# Patient Record
Sex: Female | Born: 1935 | Race: White | Hispanic: No | Smoking: Never smoker
Health system: Southern US, Community
[De-identification: ages and names within clinical notes are randomized; demographics above are authoritative.]

## PROBLEM LIST (undated history)

## (undated) DIAGNOSIS — N8110 Cystocele, unspecified: Secondary | ICD-10-CM

## (undated) DIAGNOSIS — I10 Essential (primary) hypertension: Secondary | ICD-10-CM

## (undated) DIAGNOSIS — M4800 Spinal stenosis, site unspecified: Secondary | ICD-10-CM

## (undated) DIAGNOSIS — K52831 Collagenous colitis: Secondary | ICD-10-CM

## (undated) DIAGNOSIS — K589 Irritable bowel syndrome without diarrhea: Secondary | ICD-10-CM

## (undated) DIAGNOSIS — C801 Malignant (primary) neoplasm, unspecified: Secondary | ICD-10-CM

## (undated) DIAGNOSIS — N816 Rectocele: Secondary | ICD-10-CM

## (undated) DIAGNOSIS — K219 Gastro-esophageal reflux disease without esophagitis: Secondary | ICD-10-CM

## (undated) DIAGNOSIS — H269 Unspecified cataract: Secondary | ICD-10-CM

## (undated) DIAGNOSIS — R0789 Other chest pain: Secondary | ICD-10-CM

## (undated) DIAGNOSIS — H18519 Endothelial corneal dystrophy, unspecified eye: Secondary | ICD-10-CM

## (undated) DIAGNOSIS — K5790 Diverticulosis of intestine, part unspecified, without perforation or abscess without bleeding: Secondary | ICD-10-CM

## (undated) DIAGNOSIS — M549 Dorsalgia, unspecified: Secondary | ICD-10-CM

## (undated) DIAGNOSIS — M1990 Unspecified osteoarthritis, unspecified site: Secondary | ICD-10-CM

## (undated) DIAGNOSIS — G4700 Insomnia, unspecified: Secondary | ICD-10-CM

## (undated) DIAGNOSIS — R011 Cardiac murmur, unspecified: Secondary | ICD-10-CM

## (undated) DIAGNOSIS — T7840XA Allergy, unspecified, initial encounter: Secondary | ICD-10-CM

## (undated) DIAGNOSIS — B029 Zoster without complications: Secondary | ICD-10-CM

## (undated) DIAGNOSIS — B3781 Candidal esophagitis: Secondary | ICD-10-CM

## (undated) DIAGNOSIS — M419 Scoliosis, unspecified: Secondary | ICD-10-CM

## (undated) DIAGNOSIS — Z8739 Personal history of other diseases of the musculoskeletal system and connective tissue: Secondary | ICD-10-CM

## (undated) DIAGNOSIS — E210 Primary hyperparathyroidism: Secondary | ICD-10-CM

## (undated) DIAGNOSIS — H01009 Unspecified blepharitis unspecified eye, unspecified eyelid: Secondary | ICD-10-CM

## (undated) DIAGNOSIS — K449 Diaphragmatic hernia without obstruction or gangrene: Secondary | ICD-10-CM

## (undated) DIAGNOSIS — M810 Age-related osteoporosis without current pathological fracture: Secondary | ICD-10-CM

## (undated) DIAGNOSIS — H1851 Endothelial corneal dystrophy: Secondary | ICD-10-CM

## (undated) DIAGNOSIS — E785 Hyperlipidemia, unspecified: Secondary | ICD-10-CM

## (undated) HISTORY — DX: Unspecified osteoarthritis, unspecified site: M19.90

## (undated) HISTORY — DX: Hypercalcemia: E83.52

## (undated) HISTORY — DX: Candidal esophagitis: B37.81

## (undated) HISTORY — DX: Other chest pain: R07.89

## (undated) HISTORY — DX: Scoliosis, unspecified: M41.9

## (undated) HISTORY — DX: Diverticulosis of intestine, part unspecified, without perforation or abscess without bleeding: K57.90

## (undated) HISTORY — DX: Gastro-esophageal reflux disease without esophagitis: K21.9

## (undated) HISTORY — DX: Age-related osteoporosis without current pathological fracture: M81.0

## (undated) HISTORY — DX: Zoster without complications: B02.9

## (undated) HISTORY — DX: Cardiac murmur, unspecified: R01.1

## (undated) HISTORY — DX: Personal history of other diseases of the musculoskeletal system and connective tissue: Z87.39

## (undated) HISTORY — DX: Insomnia, unspecified: G47.00

## (undated) HISTORY — DX: Rectocele: N81.6

## (undated) HISTORY — DX: Essential (primary) hypertension: I10

## (undated) HISTORY — DX: Unspecified cataract: H26.9

## (undated) HISTORY — DX: Cystocele, unspecified: N81.10

## (undated) HISTORY — DX: Irritable bowel syndrome, unspecified: K58.9

## (undated) HISTORY — DX: Spinal stenosis, site unspecified: M48.00

## (undated) HISTORY — DX: Collagenous colitis: K52.831

## (undated) HISTORY — DX: Allergy, unspecified, initial encounter: T78.40XA

## (undated) HISTORY — PX: TONSILLECTOMY AND ADENOIDECTOMY: SUR1326

## (undated) HISTORY — DX: Unspecified blepharitis unspecified eye, unspecified eyelid: H01.009

## (undated) HISTORY — DX: Dorsalgia, unspecified: M54.9

## (undated) HISTORY — DX: Malignant (primary) neoplasm, unspecified: C80.1

## (undated) HISTORY — DX: Endothelial corneal dystrophy, unspecified eye: H18.519

## (undated) HISTORY — PX: ABDOMINAL HYSTERECTOMY: SHX81

## (undated) HISTORY — DX: Hyperlipidemia, unspecified: E78.5

## (undated) HISTORY — PX: CATARACT EXTRACTION W/ INTRAOCULAR LENS  IMPLANT, BILATERAL: SHX1307

## (undated) HISTORY — DX: Diaphragmatic hernia without obstruction or gangrene: K44.9

## (undated) HISTORY — DX: Endothelial corneal dystrophy: H18.51

## (undated) HISTORY — DX: Primary hyperparathyroidism: E21.0

---

## 1962-01-01 HISTORY — PX: BREAST BIOPSY: SHX20

## 1973-01-01 DIAGNOSIS — B029 Zoster without complications: Secondary | ICD-10-CM

## 1973-01-01 HISTORY — DX: Zoster without complications: B02.9

## 2004-02-01 ENCOUNTER — Ambulatory Visit: Payer: Self-pay | Admitting: Internal Medicine

## 2004-02-10 ENCOUNTER — Ambulatory Visit: Payer: Self-pay | Admitting: Internal Medicine

## 2006-04-08 ENCOUNTER — Ambulatory Visit: Payer: Self-pay | Admitting: Internal Medicine

## 2006-04-09 ENCOUNTER — Encounter: Payer: Self-pay | Admitting: Internal Medicine

## 2006-04-23 ENCOUNTER — Ambulatory Visit: Payer: Self-pay | Admitting: Internal Medicine

## 2006-04-24 ENCOUNTER — Ambulatory Visit: Payer: Self-pay | Admitting: Internal Medicine

## 2006-04-24 DIAGNOSIS — K5730 Diverticulosis of large intestine without perforation or abscess without bleeding: Secondary | ICD-10-CM | POA: Insufficient documentation

## 2006-04-24 HISTORY — PX: COLONOSCOPY: SHX174

## 2006-04-25 ENCOUNTER — Ambulatory Visit: Payer: Self-pay | Admitting: Internal Medicine

## 2006-05-02 ENCOUNTER — Ambulatory Visit: Payer: Self-pay | Admitting: Internal Medicine

## 2006-05-02 LAB — CONVERTED CEMR LAB
Albumin: 3.4 g/dL — ABNORMAL LOW (ref 3.5–5.2)
BUN: 13 mg/dL (ref 6–23)
CEA: 0.5 ng/mL (ref 0.0–5.0)
CO2: 30 meq/L (ref 19–32)
Calcium: 9.6 mg/dL (ref 8.4–10.5)
Chloride: 108 meq/L (ref 96–112)
Creatinine, Ser: 0.8 mg/dL (ref 0.4–1.2)
GFR calc Af Amer: 89 mL/min
GFR calc non Af Amer: 74 mL/min
Glucose, Bld: 90 mg/dL (ref 70–99)
Phosphorus: 3.5 mg/dL (ref 2.3–4.6)
Potassium: 4.5 meq/L (ref 3.5–5.1)
Sodium: 142 meq/L (ref 135–145)

## 2006-05-31 ENCOUNTER — Ambulatory Visit: Payer: Self-pay | Admitting: Internal Medicine

## 2006-08-27 ENCOUNTER — Ambulatory Visit: Payer: Self-pay | Admitting: Internal Medicine

## 2007-04-03 DIAGNOSIS — K648 Other hemorrhoids: Secondary | ICD-10-CM | POA: Insufficient documentation

## 2007-04-03 DIAGNOSIS — K219 Gastro-esophageal reflux disease without esophagitis: Secondary | ICD-10-CM | POA: Insufficient documentation

## 2007-04-03 DIAGNOSIS — K52831 Collagenous colitis: Secondary | ICD-10-CM | POA: Insufficient documentation

## 2007-09-11 ENCOUNTER — Encounter: Payer: Self-pay | Admitting: Internal Medicine

## 2007-10-30 HISTORY — PX: US ECHOCARDIOGRAPHY: HXRAD669

## 2007-12-02 ENCOUNTER — Telehealth: Payer: Self-pay | Admitting: Internal Medicine

## 2007-12-03 HISTORY — PX: CARDIOVASCULAR STRESS TEST: SHX262

## 2008-01-02 DIAGNOSIS — K449 Diaphragmatic hernia without obstruction or gangrene: Secondary | ICD-10-CM

## 2008-01-02 DIAGNOSIS — B3781 Candidal esophagitis: Secondary | ICD-10-CM

## 2008-01-02 HISTORY — DX: Candidal esophagitis: B37.81

## 2008-01-02 HISTORY — DX: Diaphragmatic hernia without obstruction or gangrene: K44.9

## 2008-01-20 ENCOUNTER — Telehealth: Payer: Self-pay | Admitting: Internal Medicine

## 2008-01-28 ENCOUNTER — Ambulatory Visit: Payer: Self-pay | Admitting: Internal Medicine

## 2008-01-29 ENCOUNTER — Encounter: Payer: Self-pay | Admitting: Internal Medicine

## 2008-01-29 ENCOUNTER — Ambulatory Visit: Payer: Self-pay | Admitting: Internal Medicine

## 2008-01-29 ENCOUNTER — Other Ambulatory Visit: Admission: RE | Admit: 2008-01-29 | Discharge: 2008-01-29 | Payer: Self-pay | Admitting: Internal Medicine

## 2008-01-29 ENCOUNTER — Encounter (INDEPENDENT_AMBULATORY_CARE_PROVIDER_SITE_OTHER): Payer: Self-pay

## 2008-01-29 HISTORY — PX: UPPER GASTROINTESTINAL ENDOSCOPY: SHX188

## 2008-01-31 ENCOUNTER — Encounter: Payer: Self-pay | Admitting: Internal Medicine

## 2008-03-09 ENCOUNTER — Telehealth: Payer: Self-pay | Admitting: Internal Medicine

## 2008-05-19 ENCOUNTER — Telehealth: Payer: Self-pay | Admitting: Internal Medicine

## 2008-10-15 ENCOUNTER — Encounter: Payer: Self-pay | Admitting: Nurse Practitioner

## 2008-10-15 ENCOUNTER — Encounter: Admission: RE | Admit: 2008-10-15 | Discharge: 2008-10-15 | Payer: Self-pay | Admitting: Cardiology

## 2008-10-20 ENCOUNTER — Telehealth: Payer: Self-pay | Admitting: Internal Medicine

## 2008-10-28 ENCOUNTER — Telehealth: Payer: Self-pay | Admitting: Internal Medicine

## 2008-10-29 ENCOUNTER — Ambulatory Visit: Payer: Self-pay | Admitting: Internal Medicine

## 2008-10-29 DIAGNOSIS — R197 Diarrhea, unspecified: Secondary | ICD-10-CM | POA: Insufficient documentation

## 2008-10-29 DIAGNOSIS — R195 Other fecal abnormalities: Secondary | ICD-10-CM | POA: Insufficient documentation

## 2008-10-29 LAB — CONVERTED CEMR LAB
Basophils Absolute: 0 10*3/uL (ref 0.0–0.1)
Basophils Relative: 0.8 % (ref 0.0–3.0)
Eosinophils Absolute: 0.1 10*3/uL (ref 0.0–0.7)
Eosinophils Relative: 1.2 % (ref 0.0–5.0)
HCT: 31.5 % — ABNORMAL LOW (ref 36.0–46.0)
Hemoglobin: 10.7 g/dL — ABNORMAL LOW (ref 12.0–15.0)
Iron: 58 ug/dL (ref 42–145)
Lymphocytes Relative: 26.9 % (ref 12.0–46.0)
Lymphs Abs: 1.3 10*3/uL (ref 0.7–4.0)
MCHC: 34.1 g/dL (ref 30.0–36.0)
MCV: 96.8 fL (ref 78.0–100.0)
Monocytes Absolute: 0.4 10*3/uL (ref 0.1–1.0)
Monocytes Relative: 8 % (ref 3.0–12.0)
Neutro Abs: 3.2 10*3/uL (ref 1.4–7.7)
Neutrophils Relative %: 63.1 % (ref 43.0–77.0)
Platelets: 244 10*3/uL (ref 150.0–400.0)
RBC: 3.25 M/uL — ABNORMAL LOW (ref 3.87–5.11)
RDW: 13.4 % (ref 11.5–14.6)
Saturation Ratios: 20.5 % (ref 20.0–50.0)
Transferrin: 201.9 mg/dL — ABNORMAL LOW (ref 212.0–360.0)
Vitamin B-12: 500 pg/mL (ref 211–911)
WBC: 5 10*3/uL (ref 4.5–10.5)

## 2008-11-04 ENCOUNTER — Telehealth: Payer: Self-pay | Admitting: Nurse Practitioner

## 2008-11-12 ENCOUNTER — Telehealth: Payer: Self-pay | Admitting: Internal Medicine

## 2008-11-16 ENCOUNTER — Telehealth: Payer: Self-pay | Admitting: Internal Medicine

## 2008-11-22 ENCOUNTER — Ambulatory Visit: Payer: Self-pay | Admitting: Internal Medicine

## 2008-11-23 ENCOUNTER — Ambulatory Visit: Payer: Self-pay | Admitting: Internal Medicine

## 2008-11-24 ENCOUNTER — Telehealth: Payer: Self-pay | Admitting: Internal Medicine

## 2008-11-29 ENCOUNTER — Telehealth: Payer: Self-pay | Admitting: Internal Medicine

## 2008-11-29 ENCOUNTER — Encounter: Payer: Self-pay | Admitting: Internal Medicine

## 2008-11-30 ENCOUNTER — Encounter: Payer: Self-pay | Admitting: Internal Medicine

## 2008-12-08 ENCOUNTER — Telehealth: Payer: Self-pay | Admitting: Internal Medicine

## 2008-12-13 ENCOUNTER — Telehealth: Payer: Self-pay | Admitting: Internal Medicine

## 2008-12-30 ENCOUNTER — Telehealth: Payer: Self-pay | Admitting: Internal Medicine

## 2009-01-03 ENCOUNTER — Ambulatory Visit: Payer: Self-pay | Admitting: Internal Medicine

## 2009-08-17 ENCOUNTER — Ambulatory Visit: Payer: Self-pay | Admitting: Cardiology

## 2009-12-12 ENCOUNTER — Telehealth: Payer: Self-pay | Admitting: Internal Medicine

## 2009-12-13 ENCOUNTER — Encounter (INDEPENDENT_AMBULATORY_CARE_PROVIDER_SITE_OTHER): Payer: Self-pay | Admitting: *Deleted

## 2009-12-29 ENCOUNTER — Telehealth: Payer: Self-pay | Admitting: Internal Medicine

## 2010-01-05 ENCOUNTER — Ambulatory Visit: Admit: 2010-01-05 | Payer: Self-pay | Admitting: Internal Medicine

## 2010-01-22 ENCOUNTER — Encounter: Payer: Self-pay | Admitting: Internal Medicine

## 2010-01-27 ENCOUNTER — Telehealth: Payer: Self-pay | Admitting: Internal Medicine

## 2010-02-01 ENCOUNTER — Telehealth: Payer: Self-pay | Admitting: Internal Medicine

## 2010-02-02 NOTE — Progress Notes (Signed)
Summary: Prednizone refill  Phone Note Call from Patient Call back at Home Phone [phone] Call back at [phone] CELL   Call For: DR Harry Shuck Reason for Call: Talk to Nurse Summary of Call: Prednizone really helped her colitis. Is going on a cruise and would feel better if she can take some prednisone with her. Dr Juanda Chance told her she would not mind letting her have some. Uses [facility] on [address]. Initial call taken by: Leanor Kail [facility],  December 02, 2007 9:15 AM  Follow-up for Phone Call        Dr Juanda Chance  Is it ok to order.  last seen in office 8/09.  Patient going on cruise. Follow-up by: Paulene Floor, RN,  December 02, 2007 11:18 AM  Additional Follow-up for Phone Call Additional follow up Details #1::        OK to refill.    Additional Follow-up for Phone Call Additional follow up Details #2::    Script sent to pharmacy will call patient to let her know. Paulene Floor, RN  December 03, 2007 8:09 AM Lm on answering machine and cell phone for patient. Follow-up by: Paulene Floor, RN,  December 03, 2007 8:22 AM  New/Updated Medications: PREDNISONE 10 MG TABS (PREDNISONE) Take 1 tablet by mouth once a day   Prescriptions: PREDNISONE 10 MG TABS (PREDNISONE) Take 1 tablet by mouth once a day  #30 x 0   Entered by:   Paulene Floor, RN   Authorized by:   Hart Carwin MD   Signed by:   Paulene Floor, RN on 12/03/2007   Method used:   Electronically to        [facility]  [phone]* (retail)       [address]       [place], [place]  [address]       Ph: [phone] or [phone]       Fax: [phone]   RxID:   [id number]

## 2010-02-02 NOTE — Procedures (Signed)
Summary: EGD   EGD  Procedure date:  01/29/2008  Findings:      Location: [facility]    ENDOSCOPY PROCEDURE REPORT  PATIENT:  Beth, Wise  MR#:  [id number] BIRTHDATE:   06-Sep-1925   GENDER:   female  ENDOSCOPIST:   Hedwig Morton. Juanda Chance, MD Referred by: Cassell Clement, M.D.  PROCEDURE DATE:  01/29/2008 PROCEDURE:  EGD with biopsy, EGD w/brushings ASA CLASS:   Class II INDICATIONS: scratchy throat, dyspepsia, chest pain,negative cardiac eval., Protonix helps  MEDICATIONS:    Versed 7 mg, Fentanyl 50 mcg TOPICAL ANESTHETIC:   Exactacain Spray  DESCRIPTION OF PROCEDURE:   After the risks benefits and alternatives of the procedure were thoroughly explained, informed consent was obtained.  The LB GIF-H180 [id number] endoscope was introduced through the mouth and advanced to the second portion of the duodenum, without limitations.  The instrument was slowly withdrawn as the mucosa was fully examined. <<PROCEDUREIMAGES>>            <<OLD IMAGES>>  ULTRASONIC FINDINGS:   A hiatal hernia was found. -3 cm Hiatal hernia, reducible biopsies at the g-e junction With standard forceps, a biopsy was obtained and sent to pathology (see image6 and image5). Esophagitis was found in the proximal esophagus. scattered specks of white exudate adherent to the mucosa, c/w Candida esophagitis, A brushing for microbiology was obtained for fungal analysis (see image8 and image7).  Mild gastritis was found in the antrum (see image3).  The duodenal bulb was normal in appearance, as was the postbulbar duodenum (see image2).  The stomach was entered and closely examined. The antrum, angularis, and lesser curvature were well visualized, including a retroflexed view of the cardia and fundus. The stomach wall was normally distensable. The scope passed easily through the pylorus into the duodenum. in the fundus (see image4).    Retroflexed views revealed no masses.    The scope was then withdrawn from the patient  and the procedure completed.  COMPLICATIONS:   None  ENDOSCOPIC IMPRESSION:  1) Esophagitis in the proximal esophagus  2) Hiatal hernia  3) Mild gastritis in the antrum  4) Normal duodenum  5) Normal stomach in the fundus  6) No masses  7) Candidal esophagitis  s/p brushings for fungus,  s/p biopsies from the antrum RECOMMENDATIONS:  1) anti-reflux regimen  Diflucan 100mg , # 3, 1 po qd  Nexiem 40 mg,1 po qd  REPEAT EXAM:   In 0 year(s) for.   _______________________________ Hedwig Morton. Juanda Chance, MD    CC: Cassell Clement, MD    REPORT OF SURGICAL PATHOLOGY   Case #: [id number] [id number] Patient Name: Beth Wise, Beth Wise. Office Chart Number:  [id number]   MRN: [id number] Pathologist: Alden Server A. Delila Spence, MD DOB/Age  Jan 18, 1925 (Age: 77)    Gender: F Date Taken:  01/29/2008 Date Received: 01/29/2008   FINAL DIAGNOSIS   ***MICROSCOPIC EXAMINATION AND DIAGNOSIS***   1.  STOMACH, ANTRUM, BIOPSIES:   -  GASTRIC MUCOSA WITH CHRONIC GASTRITIS AND FOCAL INTESTINAL METAPLASIA. -  NO DYSPLASIA OR MALIGNANCY IDENTIFIED.   2. GASTROESOPHAGEAL JUNCTION, BIOPSY:  -  ESOPHAGOGASTRIC JUNCTION MUCOSA WITH MILD INFLAMMATION.   -  NO INTESTINAL METAPLASIA, DYSPLASIA OR MALIGNANCY IDENTIFIED (BIOPSY).   COMMENT 1. A Warthin-Starry stain is performed to determine the possibility of the presence of Helicobacter pylori. The Warthin-Starry stain is negative for organisms of Helicobacter pylori. The control(s) stained appropriately.   2. A special stain (PAS-F) is performed to determine the presence or absence of fungal hyphae in this  biopsy. The PAS stain is negative for fungal hyphae, and the controls stained appropriately.    An Alcian Blue stain is performed to determine the presence of intestinal metaplasia (goblet cell metaplasia). No intestinal metaplasia (goblet cell metaplasia) is identified with the Alcian Blue stain. The control stained appropriately. (EA:jy) 01/30/08    jy Date Reported:  01/30/2008     Alden Server A. Delila Spence, MD *** Electronically Signed Out By EAA ***    January 31, 2008 MRN: [id number]    [facility] [address] [place], [place]  [address]    Dear [name],  I am pleased to inform you that the biopsies taken during your recent endoscopic examination did not show any evidence of cancer upon pathologic examination.The biopsies from the esophagus show normal tissue. The biopsies from the stomach show mild gastritis  Additional information/recommendations:  __No further action is needed at this time.  Please follow-up with      your primary care physician for your other healthcare needs.  __ Please call [phone] to schedule a return visit to review      your condition.  _x_ Continue with the treatment plan as outlined on the day of your      exam.  __ You should have a repeat endoscopic examination for this problem              in _ months/years.   Please call us if you are having persistent problems or have questions about your condition that have not been fully answered at this time.  Sincerely,  Hart Carwin MD  This letter has been electronically signed by your physician.  This report was created from the original endoscopy report, which was reviewed and signed by the above listed endoscopist.

## 2010-02-02 NOTE — Progress Notes (Signed)
Summary: TRIAGE--Diarrhea  Phone Note Call from Patient Call back at [phone]   Call For: DR Haze Antillon Reason for Call: Talk to Nurse Summary of Call: Having diarrhea while on her vacation. (Local Pharm.# [phone]) Initial call taken by: Leanor Kail [facility],  October 20, 2008 9:57 AM  Follow-up for Phone Call        Pt. had been on Clindamycin for 2 weeks for dental work, she completed last Thursday. Some cramping. Stools are watery, has 3-5 daily, stools have been dark. Denies pepto-bismol use, blood, fever. Doesn't have Bentyl with her. She does have prednisone.   1) Get Bentyl script filled and begin use 2) Soft,bland diet. No spicy,greasy,fried foods. BRAT diet. 3) I will call pt., with new orders, after MD reviews.  Follow-up by: Laureen Ochs LPN,  October 20, 2008 10:57 AM  Additional Follow-up for Phone Call Additional follow up Details #1::        Hx. 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 Please start Flagyl 250 mg by mouth tid x 10 days # 30, 1 refill,, also Questran 4gm/pack, 1 by mouth once daily , # 15, 1 refill, take apart from all other medications.  Additional Follow-up by: Hart Carwin MD,  October 21, 2008 1:11 PM    Additional Follow-up for Phone Call Additional follow up Details #2::    Above MD orders reviewed with patient. Meds phoned to her local pharmacy. Pt. instructed to call back as needed.  Follow-up by: Laureen Ochs LPN,  October 21, 2008 2:16 PM

## 2010-02-02 NOTE — Letter (Signed)
Summary: Patient Notice- Colon Biospy Results  [facility]  [address] [place], [place] [address]   Phone: [phone]  Fax: [phone]        November 30, 2008 MRN: [id number]    [facility] [address] [place], [place]  [address]    Dear Ms. Dreisbach,  I am pleased to inform you that the biopsies taken during your recent colonoscopy did not show any evidence of cancer upon pathologic examination.The biopsies show lymphocytic colitis  Additional information/recommendations:  __No further action is needed at this time.  Please follow-up with      your primary care physician for your other healthcare needs.  _x_Please call [phone] to schedule a return visit to review      your condition.Prednisone 20 mg daily  _x_Continue with the treatment plan as outlined on the day of your      exam.    Please call us if you are having persistent problems or have questions about your condition that have not been fully answered at this time.  Sincerely,  Hart Carwin MD   This letter has been electronically signed by your physician.  Appended Document: Patient Notice- Colon Biospy Results Letter mailed 12.01.10

## 2010-02-02 NOTE — Progress Notes (Signed)
Summary: TRIAGE  Phone Note Call from Patient Call back at Home Phone [phone]   Caller: Patient Call For: Dr. Juanda Chance Reason for Call: Talk to Nurse Details for Reason: Triage Summary of Call: Patient is concerned about her black tarry stools. Can meds be doing this? Initial call taken by: Zackery Barefoot,  October 28, 2008 3:38 PM  Follow-up for Phone Call          Pt. c/o loose stools, but now they are black, states most of them have been black for 2 weeks.Continues with cramping discomfort in lower abd. and some fatigue.  Denies BRB & fever. Denies using Pepto.   Pt. will see Willette Cluster NP on 10-29-08 at 1:30pm, but will stop in the lab for CBCD, prior.   DR.Gianna Calef-Does she need any other labs?     Follow-up by: Laureen Ochs LPN,  October 28, 2008 3:54 PM  Additional Follow-up for Phone Call Additional follow up Details #1::        chart reviewed. Hx of microscopic colitis. CBC,Fe,IBC,B12. Additional Follow-up by: Hart Carwin MD,  October 28, 2008 5:45 PM     Appended Document: TRIAGE Additional labs put in IDX.

## 2010-02-02 NOTE — Progress Notes (Signed)
Summary: TRIAGE  Phone Note Call from Patient Call back at Home Phone [phone]   Caller: Patient Call For: Jarek Longton Reason for Call: Talk to Doctor Summary of Call: Patient wants to speak to nurse regarding disconfort in he right upper back and has a lot of belchin and bad taste in her mouth Initial call taken by: Tawni Levy,  May 19, 2008 4:11 PM  Follow-up for Phone Call        Pt. c/o daily back pain and pain under shoulder blade. After the nightly meal she has intermittent episodes of  increased belching/bloating. Has intermittent episodes of bad taste in mouth. Denies N/V, fever,  Takes Pantoprazole, probiotic daily. Takes Bentyl 1-2 times daily.   1) Gas-x,Phazyme, etc. as needed for gas & bloating. 2) Continue Pantporazole and Bentyl 3) May use mouth wash or brush teeth more frequently to help with bad taste in mouth 4) tylenol/Ibuprofen as needed for back/shoulder pain.  5) I will call pt., if new orders, after MD reviews.    Follow-up by: Laureen Ochs LPN,  May 19, 2008 5:03 PM  Additional Follow-up for Phone Call Additional follow up Details #1::        I agree Additional Follow-up by: Hart Carwin,  May 19, 2008 11:06 PM    New/Updated Medications: PANTOPRAZOLE SODIUM 40 MG  TBEC (PANTOPRAZOLE SODIUM) 1 each day 30 minutes before meal

## 2010-02-02 NOTE — Progress Notes (Signed)
Summary: TRIAGE  Phone Note Call from Patient Call back at Home Phone [phone]   Caller: Patient Call For: Lina Sar Reason for Call: Talk to Nurse Details for Reason: Triage Summary of Call: Diarrhea on going for 2 mo on Questran over a month little to no improvement. Alternate # [id number] Initial call taken by: Darryl Lent CMA Duncan Dull),  November 16, 2008 8:58 AM  Follow-up for Phone Call        Pt. saw Gunnar Fusi on 10-29-08.  Was given Flagyl and Questran.  Pt. c/o worsening diarrhea, watery stools and fatigue. Has 1-3 stools daily. Denies blood, black stools, fever. She is taking Questran daily.   1) Soft,bland diet. No spicy,greasy,fried foods. 2) Continue Questran 3) Bentyl two times a day for 3 days, then two times a day as needed 4) Immodium as needed for diarrhea. 5) See Dr.Duffy Dantonio on 11-24-08 at 11:45am 6) If symptoms become worse call back immediately. 7) I will call pt., if new orders, after MD reviews.  Follow-up by: Laureen Ochs LPN,  November 16, 2008 10:32 AM  Additional Follow-up for Phone Call Additional follow up Details #1::        Please start Prednisone 10 mg /day till she sees me. # 30 , 1 refill Additional Follow-up by: Hart Carwin MD,  November 16, 2008 9:44 PM    Additional Follow-up for Phone Call Additional follow up Details #2::    Above MD orders reviewed with patient. Pt. to keep scheduled office visit.Pt. instructed to call back as needed.  Follow-up by: Laureen Ochs LPN,  November 17, 2008 9:21 AM

## 2010-02-02 NOTE — Letter (Signed)
Summary: Patient [facility] Biopsy Results  [facility]  [address] [place], [place] [address]   Phone: [phone]  Fax: [phone]        January 31, 2008 MRN: [id number]    [facility] [address] [place], [place]  [address]    Dear Ms. Pellum,  I am pleased to inform you that the biopsies taken during your recent endoscopic examination did not show any evidence of cancer upon pathologic examination.The biopsies from the esophagus show normal tissue. The biopsies from the stomach show mild gastritis  Additional information/recommendations:  __No further action is needed at this time.  Please follow-up with      your primary care physician for your other healthcare needs.  __ Please call [phone] to schedule a return visit to review      your condition.  _x_ Continue with the treatment plan as outlined on the day of your      exam.  __ You should have a repeat endoscopic examination for this problem              in _ months/years.   Please call us if you are having persistent problems or have questions about your condition that have not been fully answered at this time.  Sincerely,  Hart Carwin MD  This letter has been electronically signed by your physician.

## 2010-02-02 NOTE — Miscellaneous (Signed)
  Clinical Lists Changes  Medications: Added new medication of DIFLUCAN 100 MG  TABS (FLUCONAZOLE) i daily p.o. - Signed Added new medication of NEXIUM 40 MG  CPDR (ESOMEPRAZOLE MAGNESIUM) 1 capsule each day 30 minutes before meal - Signed Rx of DIFLUCAN 100 MG  TABS (FLUCONAZOLE) i daily p.o.;  #3 x 0;  Signed;  Entered by: Burman Foster RN;  Authorized by: Hart Carwin MD;  Method used: Electronically to [facility]. #[id number]*, [address], [place], [place], [place]  [address], Ph: [phone], Fax: [phone] Rx of NEXIUM 40 MG  CPDR (ESOMEPRAZOLE MAGNESIUM) 1 capsule each day 30 minutes before meal;  #30 x 2;  Signed;  Entered by: Burman Foster RN;  Authorized by: Hart Carwin MD;  Method used: Electronically to [facility]. #[id number]*, [address] [place], [place], [place]  [address], Ph: [phone], Fax: [phone] Observations: Added new observation of ALLERGY REV: Done (01/29/2008 12:10) Added new observation of NKA: T (01/29/2008 12:10)    Prescriptions: NEXIUM 40 MG  CPDR (ESOMEPRAZOLE MAGNESIUM) 1 capsule each day 30 minutes before meal  #30 x 2   Entered by:   Burman Foster RN   Authorized by:   Hart Carwin MD   Signed by:   Burman Foster RN on 01/29/2008   Method used:   Electronically to        [facility]. [phone]* (retail)       [address]       [place], [place]  [address]       Ph: [phone]       Fax: [phone]   RxID:   [id number] DIFLUCAN 100 MG  TABS (FLUCONAZOLE) i daily p.o.  #3 x 0   Entered by:   Burman Foster RN   Authorized by:   Hart Carwin MD   Signed by:   Burman Foster RN on 01/29/2008   Method used:   Electronically to        [facility]. #[id number]* (retail)       [address]       [place], [place]  [address]       Ph: [phone]       Fax: [phone]   RxID:   [id number]

## 2010-02-02 NOTE — Progress Notes (Signed)
Summary: diarrhea/flu vaccine  Phone Note Call from Patient Call back at Home Phone [phone]   Caller: Patient Call For: Dr. Juanda Chance Reason for Call: Talk to Nurse Summary of Call: complainingof diarrhea and should she get a flu vaccine if she is having diarrhea Initial call taken by: Vallarie Mare,  November 12, 2008 11:39 AM  Follow-up for Phone Call        Patient is advised as long as she is not febrile, or have any upper resp symptoms she should be fine to get a flus shot.  Diarrhea is resolving.  She completed her antibiotics.  No stool yesterday, one small loose stool this am. Follow-up by: Darcey Nora RN, CGRN,  November 12, 2008 11:52 AM

## 2010-02-02 NOTE — Progress Notes (Signed)
Summary: Speak directly to nurse  Phone Note Call from Patient Call back at Home Phone [phone]   Caller: Patient Call For: Dr. Juanda Chance Reason for Call: Talk to Nurse Summary of Call: Requesting to speak directly to [facility] Initial call taken by: Karna Christmas,  January 27, 2010 8:07 AM  Follow-up for Phone Call        Patient calling to report for the last month, she has had problems with diarrhea. States that on Jan. 1st, the diarrhea started and she thought it was due to the [facility] she was taking so she stopped the Pepcid. Yesterday, after lunch she had 6 diarrhea stools. The stools are watery and usually she had had 3-4 /day. She does have urgency and states sometimes she does not know she has to have a BM but she had diarrhea when she goes to the bathroom. States her stomach does not feel right. C/O stomach cramps and stomach pain right before having diarrhea. Denies use of antibiotics, bleeding, fever, nausea or vomiting. She did have Prednisone left over from previous rx and she started taking it yesterday(10 mg). Patient states the instructions on her old rx as 10 mg x 1 week, then 1/2 tab for 2 weeks, then 1/4 tab for 2 weeks. last flex sig 11/23/08- Proctitis, moderate diverticulosis, bx confirmed collangeous colitis. Please, advise. Follow-up by: Jesse Fall RN,  January 27, 2010 10:23 AM  Additional Follow-up for Phone Call Additional follow up Details #1::        I agree exactly with what hte pt is doing. Follow Prenisone taper, call us with an update. Additional Follow-up by: Hart Carwin MD,  January 27, 2010 1:16 PM    Additional Follow-up for Phone Call Additional follow up Details #2::    Message left for patient to call back. Jesse Fall, RN 01/27/10 2:20 PM Patient notified of Dr. Regino Schultze recommendtion. I will call and check on her next week. Follow-up by: Jesse Fall RN,  January 27, 2010 3:05 PM

## 2010-02-02 NOTE — Letter (Signed)
Summary: Patient [facility] Biopsy Results  [facility]  [address] [place], [place] [address]   Phone: [phone]  Fax: [phone]        January 31, 2008 MRN: [id number]    [facility] [address] [place], [place]  [address]    Dear Ms. Gover,  I am pleased to inform you that the biopsies taken during your recent endoscopic examination did not show any evidence of cancer upon pathologic examination.The biopsies from the esophagus showed normal tissue. The gastric biopsies showed mild gastritis  Additional information/recommendations:  x __ Continue with the treatment plan as outlined on the day of your      exam.     Please call us if you are having persistent problems or have questions about your condition that have not been fully answered at this time.  Sincerely,  Hart Carwin MD  This letter has been electronically signed by your physician.

## 2010-02-02 NOTE — Progress Notes (Signed)
Summary: TRIAGE  Phone Note Call from Patient Call back at Home Phone [phone]   Caller: Patient Call For: Dr. Juanda Chance Reason for Call: Talk to Nurse Summary of Call: Pt. is having alot of Diarrhea. Initial call taken by: Karna Christmas,  December 13, 2008 10:16 AM  Follow-up for Phone Call        Pt. is on a Prednisone taper, is down to 15mg  daily. Takes Questran daily. C/O 2 days of increased loose stools, pain in her upper shoulder after she lays down at night, pain across upper back, "Feels like a tight band" abd. bloating and early satiety.   "I just feel like there is something going on in my upper GI as well"  DR.Kaelen Brennan PLEASE ADVISE  Follow-up by: Laureen Ochs LPN,  December 13, 2008 4:51 PM  Additional Follow-up for Phone Call Additional follow up Details #1::        Please add Lomotil 1 by mouth at bedtime. #30, 1 refill, She should not go down on her Prednisone for another 4 weeks. Additional Follow-up by: Hart Carwin MD,  December 13, 2008 5:52 PM    Additional Follow-up for Phone Call Additional follow up Details #2::    Above MD orders reviewed with patient.Pt. to keep scheduled office visit.Pt. instructed to call back as needed.  Follow-up by: Laureen Ochs LPN,  December 13, 2008 5:55 PM  New/Updated Medications: LOMOTIL 2.5-0.025 MG  TABS (DIPHENOXYLATE-ATROPINE) Take one every night at bedtime. Prescriptions: LOMOTIL 2.5-0.025 MG  TABS (DIPHENOXYLATE-ATROPINE) Take one every night at bedtime.  #30 x 1   Entered by:   Laureen Ochs LPN   Authorized by:   Hart Carwin MD   Signed by:   Laureen Ochs LPN on 82/95/6213   Method used:   Telephoned to ...       [facility] [phone]* (retail)       [address]       [place], [place]  [address]       Ph: [phone] or [phone]       Fax: [phone]   RxID:   [id number]

## 2010-02-02 NOTE — Progress Notes (Signed)
Summary: Triage  Phone Note Call from Patient Call back at Home Phone [phone]   Caller: Patient Call For: Dr. Juanda Chance Reason for Call: Talk to Nurse Summary of Call: has an appt. sch'd for 01-05-10 and is feeling good. The meds seem to helping and wants to know if she should keep her appt. Initial call taken by: Karna Christmas,  December 29, 2009 3:27 PM  Follow-up for Phone Call        Patient calling to report since she started taking Pepcid 40 mg daily on 12/12/09 she is not having the choking and burning sensation. She reports she is eating well now. She states she did miss one dose and she had the same feeling again but once she took the Pepcid she was better. Patient wants to know if she should continue the Pepcid from "here on" and if she has to keep the appointment she scheduled for 01/05/10 since she is better. Please, advise. Follow-up by: Jesse Fall RN,  December 29, 2009 3:45 PM  Additional Follow-up for Phone Call Additional follow up Details #1::        no need for her to keep an OV on1/06/2010,pleasecancel her appointm without charge. Additional Follow-up by: Hart Carwin MD,  December 29, 2009 11:12 PM    Additional Follow-up for Phone Call Additional follow up Details #2::    Cancelled appointment. Patient aware. Follow-up by: Jesse Fall RN,  December 30, 2009 8:38 AM

## 2010-02-02 NOTE — Assessment & Plan Note (Signed)
Summary: F/U FROM 11-23-08 SIGMOIDOSCOPY     DEBORAH   History of Present Illness Visit Type: follow up  Primary GI MD: Lina Sar MD Primary Provider: Cassell Clement, MD  Requesting Provider: n/a Chief Complaint: F/u from sigmoid. Pt states that she feels great and denies any GI complaints  History of Present Illness:   This is a 77 year old white female with recurrent diarrhea. She has a history of microscopic colitis diagnosed in April 2008 which responded to steroids. She was well until August 2010 at which time she began to have diarrhea. She saw Willette Cluster, NP at that time and was put on antibiotics. Patient did not notice much improvement in her diarrhea after the course of antibiotics. She later cam to see me in follow up and was having soft but not watery stools which were malodorous and without blood. Patient had a sigmoidoscopy performed on 11/23/08 which showed proctitis and moderate diverticulosis. Biopsies confirmed collagenous colitis. At that time, her prednisone was increased to 20 mg daily x 2 weeks. She was later to decrease to 15 mg daily x 2 weeks, 10 mg daily x 2 weeks then 5 mg daily x 2 weeks. However, patient called on 12/13/08 with complaints of increased stools with abdominal bloating and early satiety. At that time, she was told to continue prednisone at 15 mg daily for another 4 weeks and to begin taking lomotil. Patient comes back today for follow up. She tells me that she feels much better now and denies any GI complaints at this time. She did have one loose stool yesterday.   GI Review of Systems      Denies abdominal pain, acid reflux, belching, bloating, chest pain, dysphagia with liquids, dysphagia with solids, heartburn, loss of appetite, nausea, vomiting, vomiting blood, weight loss, and  weight gain.        Denies anal fissure, black tarry stools, change in bowel habit, constipation, diarrhea, diverticulosis, fecal incontinence, heme positive stool,  hemorrhoids, irritable bowel syndrome, jaundice, light color stool, liver problems, rectal bleeding, and  rectal pain.    Current Medications (verified): 1)  Advair Diskus 100-50 Mcg/dose Misc (Fluticasone-Salmeterol) .... As Needed 2)  Dicyclomine Hcl 10 Mg  Caps (Dicyclomine Hcl) .... Take One By Mouth 2 Times Daily 3)  Questran 4 Gm Pack (Cholestyramine) .Marland Kitchen.. 1 Once Daily 4)  Multivitamins  Caps (Multiple Vitamin) .... Take 1 Tablet Daily 5)  Calcium-Magnesium 500-250 Mg Tabs (Calcium-Magnesium) .... Take 2 Daily 6)  Prednisone 10 Mg Tabs (Prednisone) .... Take As Directed At Sigmoidoscopy. 7)  Lomotil 2.5-0.025 Mg  Tabs (Diphenoxylate-Atropine) .... Take One Every Night At Bedtime. 8)  Aspirin 81 Mg  Tabs (Aspirin) .... One Tablet By Mouth Once Daily 9)  Vitamin C 500 Mg  Tabs (Ascorbic Acid) .... One Tablet By Mouth Once Daily 10)  Fish Oil   Oil (Fish Oil) .... One Tablet By Mouth Once Daily  Allergies (verified): 1)  ! Erythromycin  Past History:  Past Medical History: Reviewed history from 10/29/2008 and no changes required. Current Problems:  INTERNAL HEMORRHOIDS (ICD-455.0) DIVERTICULOSIS, COLON (ICD-562.10) GERD (ICD-530.81) MICROSCOPIC COLITIS (ICD-558.9)  Past Surgical History: Reviewed history from 01/22/2008 and no changes required. T & A Left breast biopsy hysterectomy left eye cataract extraction  Family History: Reviewed history from 01/28/2008 and no changes required. No FH of Colon Cancer: Family History of Breast Cancer: Mother Family History of Heart Disease: Brother, Sister  Social History: Reviewed history from 01/28/2008 and no changes required. Widowed Illicit  Drug Use - no Patient has never smoked.  Alcohol Use - no  Review of Systems  The patient denies allergy/sinus, anemia, anxiety-new, arthritis/joint pain, back pain, blood in urine, breast changes/lumps, change in vision, confusion, cough, coughing up blood, depression-new, fainting,  fatigue, fever, headaches-new, hearing problems, heart murmur, heart rhythm changes, itching, menstrual pain, muscle pains/cramps, night sweats, nosebleeds, pregnancy symptoms, shortness of breath, skin rash, sleeping problems, sore throat, swelling of feet/legs, swollen lymph glands, thirst - excessive , urination - excessive , urination changes/pain, urine leakage, vision changes, and voice change.         Pertinent positive and negative review of systems were noted in the above HPI. All other ROS was otherwise negative.   Vital Signs:  Patient profile:   77 year old female Height:      63 inches Weight:      156 pounds BMI:     27.73 BSA:     1.74 Pulse rate:   88 / minute Pulse rhythm:   regular BP sitting:   120 / 74  (left arm) Cuff size:   regular  Vitals Entered By: Ok Anis CMA (January 03, 2009 12:20 PM)   Impression & Recommendations:  Problem # 1:  DIARRHEA-PRESUMED INFECTIOUS (ICD-009.3) Patient has a history of collagenous colitis not responsive to prednisone. Patient will continue on her prednisone taper down to 10 mg for one week, 5 mg for one week then she will discontinue. She will stay off Prevacid which might have been contributing to her diarrhea. She also has discontinued her probiotics and she takes her Questran only p.r.n. She will let us know about the status of her diarrhea within the next few weeks.  Problem # 2:  GERD (ICD-530.81) Patient has a history of esophagitis found on upper endoscopy in 2010. She has chronic reflux symptoms. I suggested Pepcid 20 mg a day p.r.n. to avoid the use of Prevacid which may be causing diarrhea.  Patient Instructions: 1)  Prevacid discontinued. 2)  Prednisone taper as per schedule. 3)  Call us p.r.n. 4)  Copy sent to : Dr Wylene Simmer

## 2010-02-02 NOTE — Letter (Signed)
Summary: [facility]  [address] [place], [place] [address]   Phone: [phone]  Fax: [phone]       Beth Wise    09/13/1925    MRN: [id number]        Procedure Day /Date: 11/23/08 (Tuesday)     Arrival Time: 3:00 pm     Procedure Time: 4:00 pm     Location of Procedure:                    _x _  [facility] (4th Floor)   PREPARATION FOR FLEXIBLE SIGMOIDOSCOPY WITH MAGNESIUM CITRATE  Prior to the day before your procedure, purchase two Fleets Enemas from the laxative section of your drugstore.  _________________________________________________________________________________________________  THE DAY BEFORE YOUR PROCEDURE             DATE: 11/22/08   DAY: Monday  1.   Have a clear liquid dinner the night before your procedure.  2.   Do not drink anything colored red or purple.  Avoid juices with pulp.  No orange juice.              CLEAR LIQUIDS INCLUDE: Water Jello Ice Popsicles Tea (sugar ok, no milk/cream) Powdered fruit flavored drinks Coffee (sugar ok, no milk/cream) Gatorade Juice: apple, white grape, white cranberry  Lemonade Clear bullion, consomm, broth Carbonated beverages (any kind) Strained chicken noodle soup Hard Candy   3.   Drink at least 3 glasses of clear liquids before bedtime (preferably juices).    ___________________________________________________________________________________________________  THE DAY OF YOUR PROCEDURE            DATE: 11/23/08   DAY:  Tuesday  1.   Use Fleet Enema one hour prior to coming for procedure.  2.   You may drink clear liquids until 2:00 pm (2 hours before exam)       MEDICATION INSTRUCTIONS  Unless otherwise instructed, you should take regular prescription medications with a small sip of water as early as possible the morning of your procedure.        OTHER INSTRUCTIONS  You will need a responsible adult at least 75 years of age to  accompany you and drive you home.   This person must remain in the waiting room during your procedure.  Wear loose fitting clothing that is easily removed.  Leave jewelry and other valuables at home.  However, you may wish to bring a book to read or an iPod/MP3 player to listen to music as you wait for your procedure to start.  Remove all body piercing jewelry and leave at home.  Total time from sign-in until discharge is approximately 2-3 hours.  You should go home directly after your procedure and rest.  You can resume normal activities the day after your procedure.  The day of your procedure you should not:   Drive   Make legal decisions   Operate machinery   Drink alcohol   Return to work  You will receive specific instructions about eating, activities and medications before you leave.   The above instructions have been reviewed and explained to me by   Hortense Ramal, CMA    I fully understand and can verbalize these instructions _____________________________ Date 11/22/08  Appended Document: [name] at [facility] has been advised that patient has a procedure tommorrow (11/23/08) at 4:00 pm. Advised her that patient will need to be here no later than 3:00pm for registration.  Patient know that someone must be with her for the entire procedure.

## 2010-02-02 NOTE — Progress Notes (Signed)
Summary: TRIAGE  Phone Note Call from Patient Call back at [facility] Phone [phone]   Summary of Call: Pt has been on 20mg  prednisone for two weeks, she decreased to 15mg  this AM.  Pt does not feel like she is any better.  Pt would also like info about diets for colitis. Initial call taken by: Francee Piccolo CMA Duncan Dull),  December 08, 2008 8:50 AM  Follow-up for Phone Call        On triage call 11-29-08  pt. was instructed to continue Prednisone 20mg  daily and keep OV on 01-03-09 at 11:15am. Information on Colitis mailed to pt.  Message left for patient to callback.Laureen Ochs LPN  December 08, 2008 12:11 PM  Message left for patient to callback.Laureen Ochs LPN  December 09, 2008 11:05 AM       Appended Document: Orders Update Pt. is doing better, will continue Prednisone taper as instructed at her Sigmoidoscopy. Refills for Questran sent to her pharmacy. Pt. to keep scheduled office visit. Pt. instructed to call back as needed.      Clinical Lists Changes  Medications: Rx of QUESTRAN 4 GM PACK (CHOLESTYRAMINE) 1 once daily;  #30 x 6;  Signed;  Entered by: Laureen Ochs LPN;  Authorized by: Hart Carwin MD;  Method used: Electronically to [facility] [phone]*, [address] [place], [place], [place]  [address], Ph: [phone] or [phone], Fax: [phone]    Prescriptions: Lanetta Inch 4 GM PACK (CHOLESTYRAMINE) 1 once daily  #30 x 6   Entered by:   Laureen Ochs LPN   Authorized by:   Hart Carwin MD   Signed by:   Laureen Ochs LPN on 36/64/4034   Method used:   Electronically to        [facility] [phone]* (retail)       [address]       [place], [place]  [address]       Ph: [phone] or [phone]       Fax: [phone]   RxID:   [id number]

## 2010-02-02 NOTE — Procedures (Signed)
Summary: [facility]  [facility]   Imported By: Christie Nottingham 04/04/2007 09:18:07  _____________________________________________________________________  External Attachment:    Type:   Image     Comment:   External Document

## 2010-02-02 NOTE — Progress Notes (Signed)
  Phone Note Call from Patient   Caller: Patient Call For: [facility] Summary of Call: Pt called to say she has had some improvement. She feels ok.  Her stools or BM's are down to 1-2 daily. They are no longer dark or watery. Some form to them.  Gunnar Fusi told her to advise her this may take a liitle time and she should continue the Questran daily.   Initial call taken by: Joselyn Glassman,  November 04, 2008 3:38 PM  Follow-up for Phone Call        Gunnar Fusi told me to have her continue the Questran.  She is to call us if the Diarrhea returns and keep her appt on 11-30 with Dr. Juanda Chance if she feels like she needs to be seen or if diarrhea worsens. Follow-up by: Joselyn Glassman,  November 04, 2008 3:43 PM

## 2010-02-02 NOTE — Assessment & Plan Note (Signed)
Summary: DIARRHEA                  DEBORAH   History of Present Illness Visit Type: Follow-up Visit Primary GI MD: Lina Sar MD Primary Provider: Cassell Clement Chief Complaint: diarrhea History of Present Illness:   77 year old white female with recurrent diarrhea. She has a history also microscopic colitis diagnosed in April 2008 which responded to steroids. She was well until August of this year her and after taking minocycline for dental work developed diarrhea ; she called [place] the several weeks ago and saw P.Guenther NP . She at that point at home was started on Questran and Flagyl total lung 2 weeks. With no noticeable improvement. He had started her on prednisone 10 mg daily one week ago without noticeable improvement in her diarrhea. She is having soft but not watery stools and malodorous and there is no blood in it   GI Review of Systems    Reports abdominal pain.     Location of  Abdominal pain: lower abdomen.    Denies acid reflux, belching, bloating, chest pain, dysphagia with liquids, dysphagia with solids, heartburn, loss of appetite, nausea, vomiting, vomiting blood, weight loss, and  weight gain.      Reports diarrhea.      Current Medications (verified): 1)  Advair Diskus 100-50 Mcg/dose Misc (Fluticasone-Salmeterol) .... As Needed 2)  Hydrochlorothiazide 12.5 Mg Tabs (Hydrochlorothiazide) .... One Tablet By Mouth Every Other Day 3)  Dicyclomine Hcl 10 Mg  Caps (Dicyclomine Hcl) .... Take One By Mouth 2 Times Daily 4)  Prevacid 30 Mg Cpdr (Lansoprazole) .... Take 1 Daily 30 Mion Prior To Breakfast 5)  Flagyl 250 Mg Tabs (Metronidazole) .Marland Kitchen.. 1 By Mouth With Meals Three Times A Day 6)  Questran 4 Gm Pack (Cholestyramine) .Marland Kitchen.. 1 Once Daily 7)  Glucosamine 500 Mg Caps (Glucosamine Sulfate) .... Take 1 Tab Daily 8)  Multivitamins  Caps (Multiple Vitamin) .... Take 1 Tablet Daily 9)  D 1000 Plus  Tabs (Fa-Cyanocobalamin-B6-D-Ca) .... Take 1 Tab Daily 10)  Calcium-Magnesium  500-250 Mg Tabs (Calcium-Magnesium) .... Take 2 Daily 11)  Prednisone 10 Mg Tabs (Prednisone) .... Take 1 Daily  Allergies (verified): 1)  ! Erythromycin  Past History:  Past Medical History: Reviewed history from 10/29/2008 and no changes required. Current Problems:  INTERNAL HEMORRHOIDS (ICD-455.0) DIVERTICULOSIS, COLON (ICD-562.10) GERD (ICD-530.81) MICROSCOPIC COLITIS (ICD-558.9)  Past Surgical History: Reviewed history from 01/22/2008 and no changes required. T & A Left breast biopsy hysterectomy left eye cataract extraction  Family History: Reviewed history from 01/28/2008 and no changes required. No FH of Colon Cancer: Family History of Breast Cancer: Mother Family History of Heart Disease: Brother, Sister  Social History: Reviewed history from 01/28/2008 and no changes required. Widowed Illicit Drug Use - no Patient has never smoked.  Alcohol Use - no  Review of Systems       The patient complains of abdominal pain.         Pertinent positive and negative review of systems were noted in the above HPI. All other ROS was otherwise negative.   Vital Signs:  Patient profile:   77 year old female Height:      63 inches Weight:      157 pounds BMI:     27.91 Pulse rate:   88 / minute BP sitting:   124 / 68  (left arm)  Vitals Entered By: Lowry Ram NCMA (November 22, 2008 10:46 AM)  Physical Exam  General:  Well developed, well nourished, no acute distress. Eyes:  PERRLA, no icterus. Mouth:  No deformity or lesions, dentition normal. Neck:  Supple; no masses or thyromegaly. Lungs:  Clear throughout to auscultation. Heart:  Regular rate and rhythm; no murmurs, rubs,  or bruits. Abdomen:  soft nontender abdomen with somewhat hyperactive bowel sounds but no distention and no tympany. There was no palpable mass Rectal:  normal rectal tone stool was a soft Hemoccult-negative Extremities:  No clubbing, cyanosis, edema or deformities noted. Skin:  Intact  without significant lesions or rashes. Psych:  Alert and cooperative. Normal mood and affect.   Impression & Recommendations:  Problem # 1:  DIARRHEA-PRESUMED INFECTIOUS (ICD-009.3) recurrent diarrheal illness started after course of antibiotics given full dental work. She has not responded to Flagyl and Questran. For C. difficile toxin was negative.  Problem # 2:  COLITIS (ICD-558.9) history of microscopic colitis in April 2008 which responded to steroids. She's this time not responding to prednisone 10 mg a day. We will proceed with flexible sigmoidoscopy and biopsies to decide whether she needs more Flagyl or whether to increase her steroids Orders: Flex with Sedation (Flex w/Sed)  Patient Instructions: 1)  flexible sigmoidoscopy the fleets enema prep 2)  Low-residue diet 3)  Depending on the results of the sigmoidoscopy she may need another course of Flagyl or increase her prednisone depending on the pathology report 4)  Copy sent to : Dr Wylene Simmer

## 2010-02-02 NOTE — Progress Notes (Signed)
Summary: triage  Phone Note Call from Patient Call back at Home Phone [phone]   Caller: Patient Call For: Dr. Juanda Chance Reason for Call: Talk to Nurse Details for Reason: triage Summary of Call: Pt. feel like she's choking and having burning. Has appt. scheduled for 01/05/10, but wishes to be seen sooner rather than later. Initial call taken by: Schuyler Amor,  December 12, 2009 4:14 PM  Follow-up for Phone Call        Spoke with pt who stated she's having problems with choking and burning more than usual. She can eat only a few bites and feels full; also reports feeling bloated and burning after eating. Patient stated Tums and GAS X don't help. Per Dr Regino Schultze notes from 01/2009, she suggested she try 20mg  Pepcid daily; patient denies hearing this and never started. Dr Juanda Chance also listed Hx of Reflux and Esophagitis. Do we start pt on Pepcid or does she need to be seen?  Graciella Freer, RN 12/12/09 @ 1634 Follow-up by: Jesse Fall RN,  December 12, 2009 4:35 PM  Additional Follow-up for Phone Call Additional follow up Details #1::        OK to start pepcid 40 mg, #30 1 by mouth once daily., 1 refill Additional Follow-up by: Hart Carwin MD,  December 12, 2009 11:17 PM     Appended Document: triage Patient notified of Dr. Regino Schultze recommendation. Rx sent to patient's pharmacy.

## 2010-02-02 NOTE — Miscellaneous (Signed)
  Clinical Lists Changes  Medications: Added new medication of FAMOTIDINE 40 MG TABS (FAMOTIDINE) Take one daily - Signed Rx of FAMOTIDINE 40 MG TABS (FAMOTIDINE) Take one daily;  #30 x 1;  Signed;  Entered by: Jesse Fall RN;  Authorized by: Hart Carwin MD;  Method used: Electronically to [facility] [phone]*, [address] [place], [place], [place]  [address], Ph: [phone] or [phone], Fax: [phone]    Prescriptions: FAMOTIDINE 40 MG TABS (FAMOTIDINE) Take one daily  #30 x 1   Entered by:   Jesse Fall RN   Authorized by:   Hart Carwin MD   Signed by:   Jesse Fall RN on 12/13/2009   Method used:   Electronically to        [facility] [phone]* (retail)       [address]       [place], [place]  [address]       Ph: [phone] or [phone]       Fax: [phone]   RxID:   [id number]

## 2010-02-02 NOTE — Procedures (Signed)
Summary: 789.06/dn

## 2010-02-02 NOTE — Letter (Signed)
Summary: Patient [facility] Biopsy Results  [facility]  [address] [place], [place] [address]   Phone: [phone]  Fax: [phone]        January 31, 2008 MRN: [id number]    [facility] [address] [place], [place]  [address]    Dear Ms. Yellen,  I am pleased to inform you that the biopsies taken during your recent endoscopic examination did not show any evidence of cancer upon pathologic examination.The tissue from the esophagus showed Fungal ( yeast) infection  Additional information/recommendations:  __No further action is needed at this time.  Please follow-up with      your primary care physician for your other healthcare needs.  __ Please call [phone] to schedule a return visit to review      your condition.  _x_ Continue with the treatment plan as outlined on the day of your      exam.     Please call us if you are having persistent problems or have questions about your condition that have not been fully answered at this time.  Sincerely,  Hart Carwin MD  This letter has been electronically signed by your physician.

## 2010-02-02 NOTE — Letter (Signed)
Summary: [facility]   Imported By: Esmeralda Links D'jimraou 02/17/2008 16:57:25  _____________________________________________________________________  External Attachment:    Type:   Image     Comment:   External Document

## 2010-02-02 NOTE — Assessment & Plan Note (Signed)
Summary: BLACK STOOLS X 2 WEEKS         (DR.BRODIE PT)         Beth Wise   History of Present Illness Visit Type: Follow-up Visit Primary GI MD: Lina Sar MD Primary Provider: Cassell Clement, MD Chief Complaint: c/o back stools History of Present Illness:   Worked in for loose, black stool.  No NSAID use except daily ASA. Complete physical two weeks ago with PCP. Labs reviewed, hgb 11.0. Took Clindomycin recently for oral surgery. History of microscopic colitis 2008. Over last two weeks stools have become watery, black We called in Flagyl and Questran nine days ago for the diarrhea. Diarrhea was improving but yesterday had five watery stools. No abdominal pain. No fevers.    GI Review of Systems      Denies abdominal pain, acid reflux, belching, bloating, chest pain, dysphagia with liquids, dysphagia with solids, heartburn, loss of appetite, nausea, vomiting, vomiting blood, weight loss, and  weight gain.        Denies anal fissure, black tarry stools, change in bowel habit, constipation, diarrhea, diverticulosis, fecal incontinence, heme positive stool, hemorrhoids, irritable bowel syndrome, jaundice, light color stool, liver problems, rectal bleeding, and  rectal pain.    Current Medications (verified): 1)  Advair Diskus 100-50 Mcg/dose Misc (Fluticasone-Salmeterol) .... As Needed 2)  Hydrochlorothiazide 12.5 Mg Tabs (Hydrochlorothiazide) .... One Tablet By Mouth Every Other Day 3)  Dicyclomine Hcl 10 Mg  Caps (Dicyclomine Hcl) .... Take One By Mouth 2 Times Daily 4)  Pantoprazole Sodium 40 Mg  Tbec (Pantoprazole Sodium) .Marland Kitchen.. 1 Each Day 30 Minutes Before Meal 5)  Flagyl 250 Mg Tabs (Metronidazole) .Marland Kitchen.. 1 By Mouth With Meals Three Times A Day 6)  Questran 4 Gm Pack (Cholestyramine) .Marland Kitchen.. 1 Once Daily  Allergies (verified): 1)  ! Erythromycin  Past History:  Past Surgical History: Last updated: 01/22/2008 T & A Left breast biopsy hysterectomy left eye cataract extraction   Family History: Last updated: 01/28/2008 No FH of Colon Cancer: Family History of Breast Cancer: Mother Family History of Heart Disease: Brother, Sister  Social History: Last updated: 01/28/2008 Widowed Illicit Drug Use - no Patient has never smoked.  Alcohol Use - no  Past Medical History: Current Problems:  INTERNAL HEMORRHOIDS (ICD-455.0) DIVERTICULOSIS, COLON (ICD-562.10) GERD (ICD-530.81) MICROSCOPIC COLITIS (ICD-558.9)  Review of Systems       The patient complains of allergy/sinus, arthritis/joint pain, back pain, and fatigue.  The patient denies anemia, anxiety-new, blood in urine, breast changes/lumps, change in vision, confusion, cough, coughing up blood, depression-new, fainting, fever, headaches-new, hearing problems, heart murmur, heart rhythm changes, itching, menstrual pain, muscle pains/cramps, night sweats, nosebleeds, pregnancy symptoms, shortness of breath, skin rash, sleeping problems, sore throat, swelling of feet/legs, swollen lymph glands, thirst - excessive , urination - excessive , urination changes/pain, urine leakage, vision changes, and voice change.    Vital Signs:  Patient profile:   77 year old female Height:      64 inches Weight:      159 pounds BMI:     27.39 BSA:     1.78 Pulse rate:   80 / minute Pulse rhythm:   regular BP sitting:   104 / 68  (left arm)  Vitals Entered By: Merri Ray CMA Duncan Dull) (October 29, 2008 1:36 PM)  Physical Exam  General:  Well developed, well nourished, no acute distress. Head:  Normocephalic and atraumatic. Eyes:  .Conjunctiva pink, no icterus.  Mouth:  .No oral lesions. Tongue moist.  Neck:  no obvious masses  Lungs:  Clear throughout to auscultation. Heart:  Regular rate and rhythm; no murmurs, rubs,  or bruits. Abdomen:  Abdomen soft, nontender, nondistended. No obvious masses or hepatomegaly. No obvious hernias. Normal bowel sounds.   Msk:  Symmetrical with no gross deformities. Normal  posture. Extremities:  No palmar erythema, no edema.  Neurologic:  Alert and  oriented x4;  grossly normal neurologically. Skin:  Intact without significant lesions or rashes. Cervical Nodes:  No significant cervical adenopathy. Psych:  Alert and cooperative. Normal mood and affect.   Impression & Recommendations:  Problem # 1:  DIARRHEA-PRESUMED INFECTIOUS (ICD-009.3) Assessment New Diarrhea in setting of recent Clindamycin. Overall improved on 9th day of Flagyl, probiotic and Questran. Continue current management. If diarrhea recurs, will check stool studies and possibly add Vancomycin. If still no improvement consider flexible sigmoidoscopy to rule out reactivation of microscopic colitis. Follow up will be made with Dr. Juanda Chance. In meantime patient will call us in a few days with condition update.  Problem # 2:  NONSPECIFIC ABNORMAL FINDING IN STOOL CONTENTS (ICD-792.1) Assessment: Comment Only Gives two week history of black stool. Stool dark, greenish on exam. Hemoccult negative. CBC on 10/15/08 was 11.0. Today it is 10.7. Patient tells me her hgb. is usually on low side. Today's iron studies don't indicate iron deficiency with TIBC of 201, 20.5%sat, serum iron 58. Her B12 is 500. No suggestion of GI bleed. Continue PPI  Problem # 3:  GERD (ICD-530.81) Assessment: Unchanged Esophagitis on  EGD 2010. Currently controlled on daily PPI.  Patient Instructions: 1)  Finish the Flagyl, Questran and the  probiotic. 2)  We made you an appt with Dr. Juanda Chance on 11-30-08 at 3:30 PM.

## 2010-02-02 NOTE — Progress Notes (Signed)
Summary: Triage  Phone Note Call from Patient Call back at Home Phone [phone]   Caller: Patient Call For: Dr. Juanda Chance Reason for Call: Talk to Nurse Summary of Call: Pt. is concerned about the Prevacid. She has done some research on the internet and it can cause Diarrhea and that is her main problem. Initial call taken by: Karna Christmas,  December 30, 2008 4:14 PM  Follow-up for Phone Call        She stopped Prevacid yesterday. Pt. has an appt. with Dr.Loraine Bhullar on 01-03-09 at 11am. She can hold the Prevacid until then and discuss the results with Dr.Erik Nessel. Pt. instructed to call back as needed.    Follow-up by: Laureen Ochs LPN,  December 30, 2008 4:19 PM

## 2010-02-02 NOTE — Progress Notes (Signed)
Summary: TRIAGE  Phone Note Call from Patient Call back at [facility] Phone [phone]   Summary of Call: pt is calling with status update on bowel movements.  She had a formed BM last night, but has had 2 "not so formed" BM's today.  Pt states she is not taking Questran.  Wants to know if she should be taking this.  Please call pt. Initial call taken by: Francee Piccolo CMA Duncan Dull),  November 29, 2008 9:12 AM  Follow-up for Phone Call        1) Per triage on 11-24-08, continue Questran until biopsy reports available.   **2) Pt. also c/o intermittent nausea, chest discomfort that goes through to her back for approx. 1 month.    DR.Justyn Boyson PLEASE ADVISE  Follow-up by: Laureen Ochs LPN,  November 29, 2008 10:41 AM  Additional Follow-up for Phone Call Additional follow up Details #1::        Why did she cancel her appoinmt. for tomorrow? It seems like she is having lot of questions. Additional Follow-up by: Hart Carwin MD,  November 29, 2008 1:11 PM    Additional Follow-up for Phone Call Additional follow up Details #2::    Her appt. on 11-30-08 was moved to 11-24-08, per pt. request for a sooner appt. with you. Laureen Ochs LPN  November 29, 2008 1:23 PM   Additional Follow-up for Phone Call Additional follow up Details #3:: Details for Additional Follow-up Action Taken: Please tell her the biopsies from the colon confirm lymphocytic colitis. She has to continue to take Prednisone 20 mg by mouth once daily,# 100, and continue Questran daily till she has consistently formed BM's. OV 4-6 weeks Additional Follow-up by: Hart Carwin MD,  November 29, 2008 8:09 PM  Prescriptions: PREDNISONE 10 MG TABS (PREDNISONE) take 1 daily  #100 x 1   Entered by:   Laureen Ochs LPN   Authorized by:   Hart Carwin MD   Signed by:   Laureen Ochs LPN on 09/81/1914   Method used:   Electronically to        [facility] [phone]* (retail)       [address]       [place], [place]  [address]       Ph: [phone] or [phone]       Fax: [phone]   RxID:   [id number]  Above MD orders reviewed with patient. OV scheduled for 01-03-09 at 11:15am. Pt. instructed to call back as needed.Laureen Ochs LPN  November 30, 2008 9:05 AM

## 2010-02-02 NOTE — Miscellaneous (Signed)
Summary: Prednisone Rx  Clinical Lists Changes  Medications: Changed medication from PREDNISONE 10 MG TABS (PREDNISONE) take 1 daily to PREDNISONE 10 MG TABS (PREDNISONE) Take as directed at sigmoidoscopy. - Signed Rx of PREDNISONE 10 MG TABS (PREDNISONE) Take as directed at sigmoidoscopy.;  #100 x 0;  Signed;  Entered by: Hortense Ramal CMA (AAMA);  Authorized by: Hart Carwin MD;  Method used: Electronically to [facility] [phone]*, [address], [place], [place], [place]  [address], Ph: [phone] or [phone], Fax: [phone]    Prescriptions: PREDNISONE 10 MG TABS (PREDNISONE) Take as directed at sigmoidoscopy.  #100 x 0   Entered by:   Hortense Ramal CMA (AAMA)   Authorized by:   Hart Carwin MD   Signed by:   Hortense Ramal CMA (AAMA) on 11/29/2008   Method used:   Electronically to        [facility] [phone]* (retail)       [address]       [place], [place]  [address]       Ph: [phone] or [phone]       Fax: [phone]   RxID:   [id number]

## 2010-02-03 ENCOUNTER — Telehealth: Payer: Self-pay | Admitting: Internal Medicine

## 2010-02-08 NOTE — Progress Notes (Signed)
  Phone Note Outgoing Call   Call placed by: Jesse Fall, RN Call placed to: Patient Summary of Call: Called patient to check up on how she is doing. Her diarrhea is better, almost gone. She did  see a new PCP this week and he gave her an rx Sucralfate to take a night. Initial call taken by: Jesse Fall RN,  February 01, 2010 10:41 AM  Follow-up for Phone Call        Sucralfate is for  UGI  symptoms. If she is taking it, it is not for her colitis. She needs to continue on her Prednisone taper for her collagenous colitis. Follow-up by: Hart Carwin MD,  February 01, 2010 3:12 PM     Appended Document:  Patient is continuing Prednisone.

## 2010-02-08 NOTE — Progress Notes (Signed)
Summary: Talk to nuse  Phone Note Call from Patient   Call For: Dr Juanda Chance Reason for Call: Talk to Nurse Summary of Call: Wants to give nurse an update on how she is doing since earlier in the week. Initial call taken by: Leanor Kail [facility],  February 03, 2010 9:25 AM  Follow-up for Phone Call        Patient is continuing to do well. She is continuing Carafate and Prednisone taper. Follow-up by: Jesse Fall RN,  February 03, 2010 9:55 AM

## 2010-02-17 ENCOUNTER — Ambulatory Visit (INDEPENDENT_AMBULATORY_CARE_PROVIDER_SITE_OTHER): Payer: Medicare Other | Admitting: Cardiology

## 2010-02-17 DIAGNOSIS — E7800 Pure hypercholesterolemia, unspecified: Secondary | ICD-10-CM

## 2010-02-17 DIAGNOSIS — Z79899 Other long term (current) drug therapy: Secondary | ICD-10-CM

## 2010-02-17 DIAGNOSIS — I119 Hypertensive heart disease without heart failure: Secondary | ICD-10-CM

## 2010-03-23 ENCOUNTER — Telehealth: Payer: Self-pay | Admitting: Internal Medicine

## 2010-03-23 NOTE — Telephone Encounter (Signed)
Left message for patient to call back. Jesse Fall, RN 03/23/10 11:51 AM

## 2010-03-23 NOTE — Telephone Encounter (Signed)
Left message at patients cell [phone] for her to call us back.

## 2010-03-24 NOTE — Telephone Encounter (Signed)
Call back in 2 weeks  To give [place] an update. Cont Prenisone 10 mg qd

## 2010-03-24 NOTE — Telephone Encounter (Signed)
Spoke with patient . She states that for the last month she has had diarrhea off and on. This week it has increased. Stools sometimes are normal  Then during the day start being diarrhea. Yesterday, she had pudding like diarrhea x 3. Denies bleeding. Stomach is mildly crampy. The lower abdomen has this feeling all across and the pain goes away after diarrhea. States the "stomach feels tender inside." She states she has changed some of her supplements. She stopped the fish oil and started flax seed oil, started Enzymes and is using vaginal estrogen cream. She is not taking Align. She did start Prednisone 10 mg this AM. Hx microscopic colitis 2008, sigmoid- 11/23/08-proctitis, mod. Diverticulosis. Please, advise.

## 2010-03-24 NOTE — Telephone Encounter (Signed)
Patient just started the Prednisone 10 mg today.

## 2010-03-24 NOTE — Telephone Encounter (Signed)
How long has she been on Prednisone _ did it have time to work? She needs to be on it for at least 2 weeks before it works.

## 2010-03-27 NOTE — Telephone Encounter (Signed)
Spoke with patient. She will continue the Prednisone 10 mg/day and call in 2 weeks with an update. Jesse Fall

## 2010-03-31 ENCOUNTER — Other Ambulatory Visit: Payer: Self-pay | Admitting: *Deleted

## 2010-03-31 DIAGNOSIS — G4700 Insomnia, unspecified: Secondary | ICD-10-CM

## 2010-03-31 MED ORDER — FLURAZEPAM HCL 15 MG PO CAPS: ORAL_CAPSULE | ORAL | Status: DC

## 2010-04-13 ENCOUNTER — Telehealth: Payer: Self-pay | Admitting: Internal Medicine

## 2010-04-13 NOTE — Telephone Encounter (Signed)
Patient calling because she is on 1/2 tab daily now. Per Dr.Brodie, after being on 1/2 tab(5 mg) po for a week decrease to 1/2 tab daily for a month. Left a message for patient with this information.

## 2010-04-13 NOTE — Telephone Encounter (Signed)
Left a message for patient with Dr. Regino Schultze recommendations and to call me with any questions

## 2010-04-13 NOTE — Telephone Encounter (Signed)
Spoke with patient. She is feeling better. She is now down to Prednisone 5 mg daily since Saturday. She wants to know if she should continue this x 2 weeks or for 1 week. Also, wants to know if she should decrease down to 1/4 tab for 1 or 2 weeks before stopping the Prednisone. May leave patient a message on answering machine with answers. Please, advise

## 2010-04-13 NOTE — Telephone Encounter (Signed)
She can take Prednisone 5 mg 1 po qd x 1 week, then 1/2 tab po qd x 1 week, then 1/2 tab po qod x 1 week. She may have to stay on a small amount of Prednisone for a while to keep the diarrhea from coming back.

## 2010-04-19 ENCOUNTER — Other Ambulatory Visit: Payer: Self-pay | Admitting: Internal Medicine

## 2010-04-19 NOTE — Telephone Encounter (Signed)
Beth Wise- Looks like you have been talking to this patient about her prednisone. Where did she get the prednisone in the first place? From what I can tell, we havent given her any since 2010 (#100). Am I missing something?

## 2010-04-20 MED ORDER — PREDNISONE 10 MG PO TABS: ORAL_TABLET | ORAL | Status: DC

## 2010-04-20 NOTE — Telephone Encounter (Signed)
Line busy unable to reach patient. 

## 2010-04-20 NOTE — Telephone Encounter (Signed)
Spoke with patient. She has been taking her Prednisone that was ordered back on 11/28/08 by Dr. Juanda Chance. States she had pills left from that rx. Explained to patient the need to not use expired medications in the future, that I am happy to send a new rx when needed and when Dr. Juanda Chance orders her medications. Rx sent with enough Predisone to complete her 1/2 tab every other day for one month which will conclude on May 14th.

## 2010-05-05 ENCOUNTER — Telehealth: Payer: Self-pay | Admitting: Internal Medicine

## 2010-05-05 NOTE — Telephone Encounter (Signed)
Pt calling and states that she stopped taking her prednisone on 05/01/10 and she has had a couple of loose stools. Note in chart from 04/13/10 states that the pt was to continue the prednisone longer. Pt aware and will resume.

## 2010-05-09 ENCOUNTER — Other Ambulatory Visit: Payer: Self-pay | Admitting: Cardiology

## 2010-05-09 ENCOUNTER — Telehealth: Payer: Self-pay | Admitting: Internal Medicine

## 2010-05-09 DIAGNOSIS — Z79899 Other long term (current) drug therapy: Secondary | ICD-10-CM

## 2010-05-09 DIAGNOSIS — R9431 Abnormal electrocardiogram [ECG] [EKG]: Secondary | ICD-10-CM

## 2010-05-09 DIAGNOSIS — E7800 Pure hypercholesterolemia, unspecified: Secondary | ICD-10-CM

## 2010-05-09 NOTE — Telephone Encounter (Signed)
Patient calling to report that last night when eating dinner of  Hamburger and pita chips, she had to get up some phlegm. The phlegm has bright red blood in it. Denies any bleeding since this episode. States she thinks one of the chips scratched something in her throat but she wanted Dr. Juanda Chance to know. Also, she states that prior to this, she has felt weak in her legs and she was wondering if she should restart her iron. Suggested patient contact her PCP about weakness. Last EGD 01/28/10- Esophagitis, mild gastritis.

## 2010-05-09 NOTE — Telephone Encounter (Signed)
I think she ought to have a CBC here or at her PCP

## 2010-05-09 NOTE — Telephone Encounter (Signed)
Spoke with patient and she would like to have labs at her PCP. She will call their office

## 2010-05-12 ENCOUNTER — Other Ambulatory Visit: Payer: Self-pay | Admitting: Dermatology

## 2010-05-16 NOTE — Assessment & Plan Note (Signed)
[facility]                         GASTROENTEROLOGY OFFICE NOTE   Beth Wise, Beth Wise                        MRN:          [id number]  DATE:05/31/2006                            DOB:          1925/09/09    Beth Wise is a 76 year old white female with newly diagnosed microscopic  colitis, possibly caused by Indocin.  The etiology really is not clear.  She had colonoscopy on April 24, 2006 with findings of microscopic  colitis with lymphocytic and collagenous infiltrates.  She has responded  very well to prednisone 20 mg a day.  She has been able to taper her  prednisone down every 2 weeks to currently 5 mg daily.  Her bowel  movements are regular.  No diarrhea.  No crampy abdominal pain.  There  has been some increase in her gastroesophageal reflux symptoms, likely  related to steroids.  Her weight has remained stable, or slightly  decreased.  She has stayed off Indocin.  As the prednisone is being  tapered, she is starting to experience arthralgias, and is asking about  alternative treatment.   Her medications also include probiotics, and Pepcid 40 mg p.r.n.   1. Since we have discussed plans for the treatment of her microscopic      colitis, she is to continue to taper her prednisone down to 2.5 mg      a day for 2 weeks, and then discontinue.  If the symptoms recur,      she is to call us.  2. Hold off Indocin until we get her completely off prednisone.  In      the meantime, she can start glucosamine.  3. Continue probiotics.  4. Take Pepcid 40 mg daily while she is on prednisone.  She will be      able to likely discontinue it after she is off the prednisone.     Hedwig Morton. Juanda Chance, MD  Electronically Signed    DMB/MedQ  DD: 05/31/2006  DT: 05/31/2006  Job #: [id number]   cc:   Cassell Clement, M.D.

## 2010-05-16 NOTE — Assessment & Plan Note (Signed)
[facility]                         GASTROENTEROLOGY OFFICE NOTE   Beth Wise, Beth Wise                        MRN:          [id number]  DATE:08/27/2006                            DOB:          [birth year]    Beth Wise is a 77 year old white female with microscopic colitis,  diagnosed on colonoscopy in April of 2008.  She has responded to  steroids which were slowly tapered off only to relapse again several  weeks ago.  She was put on low-dose prednisone 10 mg a day, and has  slowly tapered off.  She has been off now for 1 week, and is doing  reasonably well.  She still has some crampy abdominal pain but no  diarrhea.  She feels the probiotics seem to help as well.   PHYSICAL EXAMINATION:  Blood pressure 106/60, pulse 60 and weight 158  pounds.  She is alert and oriented in no distress.  ABDOMINAL EXAM:  Unremarkable.  There is no focal tenderness.  Normoactive bowel sounds, no distension.  RECTAL EXAM:  Not repeated.   IMPRESSION:  A 77 year old white female with microscopic colitis which  seemed to be responding to low-dose steroids.  She is currently off the  prednisone, but already has had 2 flare ups after we discontinued the  prednisone.  I am somewhat concerned about her being able to stay  asymptomatic off prednisone.  I suggested that we may have to put her on  an immunomodulator such as 6-mercaptopurine if she becomes steroid  dependent.  For now she is doing well and will continue on probiotics  and Imodium on a p.r.n. basis.  If her diarrhea flares up, we will  either think about putting her on Entocort which has less side effects  than prednisone, or possibly on 6-mercaptopurine.     Hedwig Morton. Juanda Chance, MD  Electronically Signed    DMB/MedQ  DD: 08/27/2006  DT: 08/28/2006  Job #: [id number]   cc:   Cassell Clement, M.D.

## 2010-05-17 ENCOUNTER — Telehealth: Payer: Self-pay | Admitting: Internal Medicine

## 2010-05-17 NOTE — Telephone Encounter (Signed)
Please stay off Prednisone and call back in 2 weeks with a followmup.

## 2010-05-17 NOTE — Telephone Encounter (Signed)
Patient calling to report for the last week, she has had some loose stools and some formed stools. States that some stools are diarrhea like and some are just loose. This AM, she has had 2 loose stools. Today, she took her last 1/2 Prednisone pill.(She has finished the taper). Denies bleeding, vomiting. States she has ocassional nausea and sometimes she has stomach pain with bowel movements but not all the time. Also, she is worried about having to take Prednisone and its effects on her bones. States her left hip has been hurting and she is taking Aleve for this with relief. Please, advise.

## 2010-05-18 NOTE — Telephone Encounter (Signed)
Patient given Dr. Brodie's recommendation. 

## 2010-05-19 NOTE — Assessment & Plan Note (Signed)
[facility]                         GASTROENTEROLOGY OFFICE NOTE   FIORELA, PELZER                        MRN:          [id number]  DATE:04/08/2006                            DOB:          02-27-1925    Ms. Sprowl is a very nice 77 year old white female.  She lives in [place] and her primary physician is Dr. Patty Sermons.  We are seeing her  today for acute diarrhea which started about 4 weeks ago with acute  abdominal pain across the umbilical area resulting in diarrhea.  The  diarrhea has since continued, having somewhat soft bowel movements  several times a day.  She denies traveling prior to that, or taking any  antibiotics.  Friend of hers had C. difficile colitis, but she has not  been in close contact with him.  Her stools are currently mushy, she had  one accident several days ago.  There has been no fever, no blood per  rectum.  She really denies currently any abdominal pain.  Her weight has  decreased several pounds since the illness started.   MEDICATIONS:  1. Indocin 25 mg b.i.d.  2. HCTZ 12.5 every other day.  3. Dalmane nightly.  4. Zaditor eye drops b.i.d.  5. Aspirin 81 mg p.o. daily.  6. Multiple vitamins.  7. Boswellia.  8. Zyrtec daily.  9. Fish oil.   PHYSICAL EXAMINATION:  Blood pressure 120/72, pulse 88, and weight 171  pounds.  She was alert, oriented, no distress.  Balding forehead.  LUNGS:  Clear to auscultation.  COR:  Normal S1, normal S2.  ABDOMEN:  Soft with normoactive bowel sounds.  No abdominal rashes, no  distention. No tenderness.  RECTAL:  Shows normal rectal tone with soft Hemoccult-negative stool.   IMPRESSION:  A 77 year old white female with acute diarrheal illness,  now lasting for 3 or 4 weeks.  Most likely viral gastroenteritis, most  likely [place] virus.  Doubt C. difficile colitis, doubt ischemic  colitis, doubt diverticulitis.  Last colonoscopy in August 2004 showed  internal hemorrhoids  and mild diverticulosis of the left colon.   PLAN:  1. Stool cultures and C. difficile toxin.  2. Flagyl 250 p.o. t.i.d., Cipro 250 p.o. b.i.d.; both generic by      ePrescribing.  3. Imodium 2 mg one p.o. t.i.d. p.r.n.   The patient is leaving for a cruise tomorrow.  I advised her to stay on  low residue diet and call us when she returns.     Hedwig Morton. Juanda Chance, MD  Electronically Signed    DMB/MedQ  DD: 04/08/2006  DT: 04/08/2006  Job #: [id number]   cc:   Cassell Clement, M.D.

## 2010-05-19 NOTE — Assessment & Plan Note (Signed)
[facility]                         GASTROENTEROLOGY OFFICE NOTE   BRANNON, DECAIRE                        MRN:          [id number]  DATE:05/02/2006                            DOB:          22-Feb-1925    REASON FOR VISIT:  Ms. Ordaz is a 77 year old white female who has been  having intermittent diarrhea and resulting weight loss.  We have done a  series of tests and therapeutic trials to control the diarrhea but she  really has not responded well.  Her colonoscopy recently was normal.  The pathology report from biopsies of the terminal ileum is pending, but  the exam was essentially normal.  She had upper abdominal ultrasound  which showed somewhat prominent common bile duct compared to the  ultrasound in 2006, her bile duct has increased from 7 to 7.4 mm in  diameter but her gallbladder itself appears normal and pancreas also  appears normal.  She denies any abdominal pain but has been having some  nausea. Colon biopsies are still pending.   CURRENT MEDICATIONS:  Her current medications include:  1. Questran 40 grams daily.  2. Levbid 0.375 mg t.i.d.  3. Imodium two p.o. q.i.d.  4. She also has completed a course of Flagyl.   PHYSICAL EXAMINATION:  VITAL SIGNS:  Blood pressure 106/68, pulse 70.  Weight 165 pounds.  Her weight is down 6 pounds from her last visit.  GENERAL APPEARANCE:  She appears healthy.  LUNGS:  Clear to auscultation.  CARDIOVASCULAR:  Normal S1 and S2.  ABDOMEN:  Soft, relaxed with somewhat hyperactive bowel sounds.  There  was mild tenderness in the left lower quadrant and also in the  epigastrium.  The right upper quadrant was normal and so was the right  lower quadrant.  RECTAL:  Exam was not repeated.   IMPRESSION:  This is a 77 year old white female with change in bowel  habits and diarrhea of unclear etiology.  All of her cultures were  negative. Colon biopsies are pending. She has not responded to Flagyl.  The  diarrhea is not severe but it still is present.  It is associated  with some weight loss.  Also, her common bile duct is mildly increased  in size and one would wonder whether this may be a sign of pancreatic  insufficiency or possible pancreatic mass, although, on the ultrasound  the pancreas appeared normal.   PLAN:  1. CT scan of the abdomen with attention to the pancreas, also the      pelvis to rule out intra-abdominal lesion.  2. Tumor markers, CA19-9 and CEA levels.  3. Continue on the above medications.     Hedwig Morton. Juanda Chance, MD  Electronically Signed    DMB/MedQ  DD: 05/02/2006  DT: 05/02/2006  Job #: [id number]   cc:   Cassell Clement, M.D.

## 2010-05-19 NOTE — Assessment & Plan Note (Signed)
[facility]                         GASTROENTEROLOGY OFFICE NOTE   Beth Wise, Beth Wise                        MRN:          [id number]  DATE:04/23/2006                            DOB:          Oct 02, 1925    Beth Wise is a very nice 77 year old white female with 6 weeks of  diarrhea, which started with rather acute abdominal pain and large loose  bowel movements.  We have not been able to control the diarrhea  completely, although the patient is somewhat better.  The stool for  clostridium difficile and cultures were negative.  She went on a cruise  for a week and returned having more diarrhea.  She took initial course  of Cipro 250 mg twice a day for a week without improvement.  She then  started on Flagyl 250 mg p.o. t.i.d. about 5 days ago.  She still has  another 5 days to go.  She denies abdominal pain, and she has not had  any nocturnal stools, except on 1 occasion.  There has been no fever and  no blood per rectum.   MEDICATIONS:  1. Indocin 25 mg b.i.d.  2. Hydrochlorothiazide 12.5 mg every other day.  3. ? eye drops.  4. Aspirin 81 mg p.o. daily.  5. Multiple vitamins.  6. Zyrtec 1 p.o. daily.  7. Fish oil.  8. She also started Questran 4 g a day yesterday.  9. Imodium 1 p.o. q.i.d.  10.She has been on beats per minute 1 tablet 4 times a day for the      past week, which resulted in black stools.   PHYSICAL EXAM:  Blood pressure 118/68, pulse 76, and weight 169 pounds,  which represents 2-pound weight loss since last exam.  The patient was alert and oriented in no distress.  SKIN:  Warm and dry.  Sclerae anicteric.  LUNGS:  Clear to auscultation.  COR:  Normal S1, normal S2.  ABDOMEN:  Soft with normoactive bowel sounds.  No abnormal rashes.  No  tympany.  No distension.  There was a minimal tenderness in the right  lower quadrant.  Liver edge at costal margin.  RECTAL:  Not done today.   IMPRESSION:  A 77 year old white female with  persistent diarrheal stools  despite Flagyl and Cipro.  Stool cultures negative.  One questions  possibility of irritable bowel syndrome versus symptomatic  diverticulosis, versus nonspecific colitis, such as collagenous or  microscopic.  It could also be related to use of Indocin.   PLAN:  1. Colonoscopy.  Last was done in August of 2004 and it showed      diverticulosis of the left colon.  2. Upper abdominal ultrasound.  Her last ultrasound showed borderline      common bile duct.  3. Continue Flagyl 250 p.o. t.i.d.  Based on the colonoscopy results,      we may have to make further disposition.     Hedwig Morton. Juanda Chance, MD  Electronically Signed    DMB/MedQ  DD: 04/23/2006  DT: 04/23/2006  Job #: [id number]   cc:   Cassell Clement, M.D.

## 2010-06-05 ENCOUNTER — Other Ambulatory Visit: Payer: Self-pay | Admitting: Internal Medicine

## 2010-06-05 ENCOUNTER — Telehealth: Payer: Self-pay | Admitting: Internal Medicine

## 2010-06-05 NOTE — Telephone Encounter (Signed)
Patient given Dr. Regino Schultze recommendations. She states she thinks she has Bentyl already and will call me if she needs a new rx

## 2010-06-05 NOTE — Telephone Encounter (Signed)
Patient also calling to report she has had 2 shots of cortisone for a knee problem also.

## 2010-06-05 NOTE — Telephone Encounter (Signed)
Patient calling to report she has been off Prednisone for 2 weeks.States she has been better but not totally back to normal. States yesterday she had a good bowel movement. Today, it has been loose. A couple of days ago, she had 3 loose stools. States she has a discomfort in her stomach below the belly button. She does have some cramps with bowel movements. Also, wanted Dr. Juanda Chance to know she saw her PCP 3 weeks ago and she had an abnormal  High calcium level. PCP has repeated labs and is checking the parathyroid but the results are not back yet.

## 2010-06-05 NOTE — Telephone Encounter (Signed)
OK, We will try to keep her of Prednisone. She could try again Bentyl 10 mg prn cramps, #30, tid prn, 1 refill

## 2010-06-16 ENCOUNTER — Other Ambulatory Visit: Payer: Self-pay | Admitting: Internal Medicine

## 2010-06-16 ENCOUNTER — Telehealth: Payer: Self-pay | Admitting: Internal Medicine

## 2010-06-16 MED ORDER — GLYCOPYRROLATE 2 MG PO TABS: ORAL_TABLET | ORAL | Status: DC

## 2010-06-16 NOTE — Telephone Encounter (Signed)
Patient aware of Dr. Regino Schultze recommendation. Rx sent

## 2010-06-16 NOTE — Telephone Encounter (Signed)
Try Robinul 2mg , #30 , 1 po qam, 1 refill

## 2010-06-16 NOTE — Telephone Encounter (Signed)
Patient calling to report that she has been off the Prednisone for 4 weeks. C/O "same old, same old." States she is having frequent bowel movements and has started having nausea and vomiting sometimes too. She reports stomach cramping. Last night, her stomach started hurting, she went to the bathroom to have a bowel movement but did not have a bowel movement but did vomit. At 5:30 AM, she got up and had 3 bowel movements within 30 minutes. She had another BM at 9:00 AM. Does not have bowel movements like this everyday but frequently(4 times this week) Denies bleeding. Pleas, advise

## 2010-06-23 ENCOUNTER — Telehealth: Payer: Self-pay | Admitting: Internal Medicine

## 2010-06-23 NOTE — Telephone Encounter (Signed)
Patient states she is GREAT, has more energy. 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

## 2010-07-06 ENCOUNTER — Telehealth: Payer: Self-pay | Admitting: Internal Medicine

## 2010-07-06 DIAGNOSIS — K52831 Collagenous colitis: Secondary | ICD-10-CM

## 2010-07-06 NOTE — Telephone Encounter (Signed)
Patient states she did good for 8-9 days on the Robinul. Monday AM she had a normal bowel movement in AM then had 5 "blow outs" that afternoon and night.  Tuesday and Wednesday she had 3 loose stools and has had one this AM also. States she is having abdominal cramping before the stools and sometimes has cramping that does not result in a loose stool. She states she sometimes has nausea during these episodes and reports vomiting x 1 during an episode. States she has headaches in the AM and sometimes in the PM. (She states her blood pressure has been up and down lately) States she is ready to "do testing if needed to find out what is wrong with me." Hx Microscopic colitis-2008, Sigmoid- 11/23/08- proctitis, moderate diverticulosis. Please, advise.

## 2010-07-07 ENCOUNTER — Encounter: Payer: Self-pay | Admitting: *Deleted

## 2010-07-07 ENCOUNTER — Encounter: Payer: Self-pay | Admitting: Internal Medicine

## 2010-07-07 ENCOUNTER — Telehealth: Payer: Self-pay | Admitting: *Deleted

## 2010-07-07 NOTE — Telephone Encounter (Signed)
Please schedule for Flex sigm and biopsy to assess collagenous colitis.

## 2010-07-07 NOTE — Telephone Encounter (Signed)
Patient called and left a message that she has questions and wants to talk about her procedure. Available until 3:00 PM or after 4:00 PM

## 2010-07-07 NOTE — Telephone Encounter (Signed)
Spoke with patient and scheduled Flex sig on 07/27/10 at [facility] arrival 3:00 PM with 4:00 PM procedure. Pre visit on 07/20/10 at 2:30 PM. Letter mailed for pre visit.

## 2010-07-07 NOTE — Telephone Encounter (Signed)
Patient had questions re: prep for flex sig. Reviewed prep with patient and she decided to keep her appointment as scheduled

## 2010-07-14 ENCOUNTER — Telehealth: Payer: Self-pay | Admitting: *Deleted

## 2010-07-14 NOTE — Telephone Encounter (Signed)
Patient calling to report she has stopped the Robinul because it is not helping. She states he stomach feels full and tight. States she sometimes has stomach pain prior to bowel movement. States she had 3 loose stools yesterday. She has some Prednisone on hand and wants to know if she can take Prednisone to see if this will help.Hx microscopic colitis. Patient is scheduled for Flex sig on 07/27/10

## 2010-07-15 NOTE — Telephone Encounter (Signed)
Yes, she can try prednisone 10 mg/day till she sees me.

## 2010-07-17 NOTE — Telephone Encounter (Signed)
Patient notified of Dr. Regino Schultze recommendation. She had enough without a new rx.

## 2010-07-19 ENCOUNTER — Encounter: Payer: Medicare Other | Admitting: Internal Medicine

## 2010-07-19 ENCOUNTER — Other Ambulatory Visit: Payer: Medicare Other | Admitting: Internal Medicine

## 2010-07-20 ENCOUNTER — Ambulatory Visit (AMBULATORY_SURGERY_CENTER): Payer: Medicare Other | Admitting: *Deleted

## 2010-07-20 ENCOUNTER — Encounter: Payer: Self-pay | Admitting: Internal Medicine

## 2010-07-20 VITALS — Ht 64.0 in | Wt 164.0 lb

## 2010-07-20 DIAGNOSIS — R197 Diarrhea, unspecified: Secondary | ICD-10-CM

## 2010-07-24 ENCOUNTER — Telehealth: Payer: Self-pay | Admitting: Internal Medicine

## 2010-07-24 NOTE — Telephone Encounter (Signed)
Pt wanted [place] to know that her sister had "carcenoid syndrome of either the stomach or colon"  She is not sure which area.  She will find out age of onset and treatment before she comes in for colonscopy. Ezra Sites

## 2010-07-25 ENCOUNTER — Telehealth: Payer: Self-pay | Admitting: Internal Medicine

## 2010-07-25 NOTE — Telephone Encounter (Signed)
Let me do the flex sigmoidoscopy and biopsy first before changing her Prednisone

## 2010-07-25 NOTE — Telephone Encounter (Signed)
Patient is still having watery diarrhea.  She has noticed no improvement with the prednisone.  She is scheduled for a flex with you on 7/26.  Please advise.

## 2010-07-25 NOTE — Telephone Encounter (Signed)
Left message for patient to call back  

## 2010-07-26 ENCOUNTER — Telehealth: Payer: Self-pay | Admitting: *Deleted

## 2010-07-26 NOTE — Telephone Encounter (Signed)
Offered pt to come to [facility] at 0800 for 0900 procedure. Pt states that she will arrive at 0800 in the morning for 0900 flexible sigmoidoscopy. Instructed pt to take enema at 0700, and have only clear liquids in the morning until 0700, then NPO. Pt verbalized understanding.

## 2010-07-26 NOTE — Telephone Encounter (Signed)
Notified Beth Wise that Dr Juanda Chance prefers to do the Flex Sig and biopsy first before changing her Prednisone; Flex Sig 07/27/10 at 4pm. Beth Wise stated understanding.

## 2010-07-27 ENCOUNTER — Ambulatory Visit (AMBULATORY_SURGERY_CENTER): Payer: Medicare Other | Admitting: Internal Medicine

## 2010-07-27 ENCOUNTER — Encounter: Payer: Self-pay | Admitting: Internal Medicine

## 2010-07-27 DIAGNOSIS — K5289 Other specified noninfective gastroenteritis and colitis: Secondary | ICD-10-CM

## 2010-07-27 DIAGNOSIS — K5730 Diverticulosis of large intestine without perforation or abscess without bleeding: Secondary | ICD-10-CM

## 2010-07-27 DIAGNOSIS — R197 Diarrhea, unspecified: Secondary | ICD-10-CM

## 2010-07-27 HISTORY — PX: FLEXIBLE SIGMOIDOSCOPY: SHX1649

## 2010-07-27 MED ORDER — SODIUM CHLORIDE 0.9 % IV SOLN: 500.0000 mL | INTRAVENOUS | Status: DC

## 2010-07-27 NOTE — Patient Instructions (Signed)
Discharge instructions given with verbal understanding. Handout on diverticulosis and a high fiber diet given. Resume previous medications. 

## 2010-07-27 NOTE — Progress Notes (Signed)
Pt tolerated to sig very well. MAW

## 2010-07-28 ENCOUNTER — Telehealth: Payer: Self-pay

## 2010-07-28 NOTE — Telephone Encounter (Signed)

## 2010-07-31 ENCOUNTER — Telehealth: Payer: Self-pay | Admitting: Internal Medicine

## 2010-07-31 NOTE — Telephone Encounter (Signed)
Pt states that since her procedure on Thursday she has had quite a bit of diarrhea. Pt states she took her Robinul forte and it seemed to help. States she only has one pill left. Instructed pt that she has a refill on the rx so she can refill the prescription. Pt verbalized understanding.

## 2010-08-01 ENCOUNTER — Telehealth: Payer: Self-pay | Admitting: Internal Medicine

## 2010-08-02 ENCOUNTER — Encounter: Payer: Self-pay | Admitting: Internal Medicine

## 2010-08-02 MED ORDER — PREDNISONE 10 MG PO TABS: ORAL_TABLET | ORAL | Status: DC

## 2010-08-02 NOTE — Telephone Encounter (Signed)
Please see note concerning her Prednisone. It is OK to take Robinul bid. OK to take Nexiem for axid reflux. Robinul may cause difficulty in food going down.

## 2010-08-02 NOTE — Telephone Encounter (Signed)
Spoke with patient and gave her Dr. Regino Schultze recommendations. Patient does not want to start Nexium, states she will take Pepcid OTC. Rx sent for Prednisone as per Dr. Regino Schultze note on path report. Scheduled patient for OV on 08/18/10 at 2:00 PM. Patient aware.

## 2010-08-02 NOTE — Telephone Encounter (Signed)
Patient calling to report that her diarrhea has been worse since the procedure. On Sunday, she took Robinul instead of the Prednisone 1/2 tab and her diarrhea was decreased by a lot. She has continued the Robinul daily and her she does well during the day but in the evening, she has diarrhea x 6 yesterday. She is wondering if she can take the Robinul BID. Also, reports that last night when she was eating, food did not want to go down. She is not taking any PPI's . Reports Prilosec causes her more diarrhea but Nexium did not. States she has no appetite. Please, advise.

## 2010-08-02 NOTE — Telephone Encounter (Signed)
Line busy will try again. 

## 2010-08-03 ENCOUNTER — Telehealth: Payer: Self-pay | Admitting: Internal Medicine

## 2010-08-03 NOTE — Telephone Encounter (Signed)
Per procedure note, low fiber diet. Will mail out information on this.

## 2010-08-04 ENCOUNTER — Telehealth: Payer: Self-pay | Admitting: Internal Medicine

## 2010-08-04 DIAGNOSIS — R1310 Dysphagia, unspecified: Secondary | ICD-10-CM

## 2010-08-04 DIAGNOSIS — K5289 Other specified noninfective gastroenteritis and colitis: Secondary | ICD-10-CM

## 2010-08-04 MED ORDER — FLUCONAZOLE 100 MG PO TABS: 100.0000 mg | ORAL_TABLET | Freq: Every day | ORAL | Status: AC

## 2010-08-04 NOTE — Telephone Encounter (Signed)
See odynophagia ass/plan Treated with fluconazole for suspected candida esophagitis On prednisone Will need follow-up phone call to assess how diarrhea and odynophagia are early next week and then ask dr. Juanda Chance for further plans, / reduce prednisone dose or longer fluconazole tx

## 2010-08-04 NOTE — Assessment & Plan Note (Signed)
3 days of increasing odynophagia Similar to past candida esophagitis Recently started prednisone Will treat empirically with fluconazole x 10 days

## 2010-08-04 NOTE — Telephone Encounter (Signed)
Patient calling to report she is having heartburn today. Patient reports burning in throat and chest when eating her lunch and burping excessively today. She took Pepcid AC this AM and a Gas x pill. She states her diarrhea is better since starting the Prednisone and Robinol. She will take Pepcid AC BID to see if this helps.

## 2010-08-07 ENCOUNTER — Telehealth: Payer: Self-pay | Admitting: Internal Medicine

## 2010-08-07 NOTE — Telephone Encounter (Signed)
Dr. Juanda Chance, Dr. Leone Payor declined on taking this pt. Will you continue seeing her? Please advise.

## 2010-08-07 NOTE — Telephone Encounter (Signed)
Spoke with patient and she states the Diflucan has helped the odynophagia. She states she is still having diarrhea about 4 times/day. She states the stools are watery and she has urgency sometimes she does not make it to the bathroom. Also, complaining of not sleeping due to the Prednisone.

## 2010-08-07 NOTE — Telephone Encounter (Signed)
I will not be able to see this pt.

## 2010-08-07 NOTE — Telephone Encounter (Signed)
Dr. Leone Payor will you agree to see this pt? Please advise.

## 2010-08-07 NOTE — Telephone Encounter (Signed)
Yes, I do. DB

## 2010-08-07 NOTE — Telephone Encounter (Signed)
Pt states she wants to personally thank Dr. Leone Payor for helping her Friday night. Pt states he was so focused on her symptoms and knew what to do. Pt states she feels so much better. Pt is requesting to switch her care from Dr. Juanda Chance to Dr. Leone Payor. Dr. Juanda Chance do you approve the switch? Please advise.

## 2010-08-07 NOTE — Telephone Encounter (Signed)
Tell her thanks but she has a fine MD in Dr. Juanda Chance and I cannot accept her at this time due to my current commitments with patients and quality job.

## 2010-08-08 ENCOUNTER — Telehealth: Payer: Self-pay | Admitting: Internal Medicine

## 2010-08-08 ENCOUNTER — Encounter: Payer: Self-pay | Admitting: Cardiology

## 2010-08-08 NOTE — Telephone Encounter (Signed)
Collagenous colitis diet information mailed to patient.

## 2010-08-08 NOTE — Telephone Encounter (Signed)
Dr. Leone Payor......Marland KitchenDr. Juanda Chance will not continue to see this lady. Will you reconsider taking her on as a pt?

## 2010-08-08 NOTE — Telephone Encounter (Signed)
Pt scheduled to see Dr. Leone Payor 09/01/10@2 :15pm. Pt aware of appt date and time.

## 2010-08-08 NOTE — Telephone Encounter (Signed)
Ok she can be scheduled for later in Aug with me

## 2010-08-10 ENCOUNTER — Telehealth: Payer: Self-pay | Admitting: Internal Medicine

## 2010-08-10 NOTE — Telephone Encounter (Signed)
Change to 10 mg/ day prednisone after 2 weeks at 15 PCP or other GYN re vaginitis I am her gastroenterologist

## 2010-08-10 NOTE — Telephone Encounter (Signed)
Patient states she will decrease her Prednisone from 20 mg/day to 15 mg/day on 08/16/10. She wants to know if she should stay on 15 mg/day until she sees Dr. Leone Payor on 09/01/10? Also, states she received information in the mail from Dr. Leone Payor and the sentences are not complete and she received page 2 of 7. Wants to be sure she wasn't suppose to get more pages and wants a copy with complete sentences. Beth Wise will mail patient another copy and she only needs page 2 per [facility]. Also, she started having vaginitis(vaginal itching) yesterday. She had 2 creams on hand from her GYN - Sulfaciazine 1% cream and Nystatin Triaemtinolone cream which she used and it did help. She is worried that the creams have expired and her GYN has retired. Please, advise.

## 2010-08-10 NOTE — Telephone Encounter (Signed)
Patient given Dr. Marvell Fuller recommendations.

## 2010-08-14 ENCOUNTER — Encounter: Payer: Self-pay | Admitting: Cardiology

## 2010-08-16 ENCOUNTER — Ambulatory Visit (INDEPENDENT_AMBULATORY_CARE_PROVIDER_SITE_OTHER): Payer: Medicare Other | Admitting: Cardiology

## 2010-08-16 ENCOUNTER — Encounter: Payer: Self-pay | Admitting: Cardiology

## 2010-08-16 ENCOUNTER — Other Ambulatory Visit (INDEPENDENT_AMBULATORY_CARE_PROVIDER_SITE_OTHER): Payer: Medicare Other | Admitting: *Deleted

## 2010-08-16 DIAGNOSIS — Z79899 Other long term (current) drug therapy: Secondary | ICD-10-CM

## 2010-08-16 DIAGNOSIS — R0989 Other specified symptoms and signs involving the circulatory and respiratory systems: Secondary | ICD-10-CM

## 2010-08-16 DIAGNOSIS — E7800 Pure hypercholesterolemia, unspecified: Secondary | ICD-10-CM

## 2010-08-16 DIAGNOSIS — I10 Essential (primary) hypertension: Secondary | ICD-10-CM

## 2010-08-16 DIAGNOSIS — R079 Chest pain, unspecified: Secondary | ICD-10-CM | POA: Insufficient documentation

## 2010-08-16 DIAGNOSIS — E785 Hyperlipidemia, unspecified: Secondary | ICD-10-CM

## 2010-08-16 DIAGNOSIS — E559 Vitamin D deficiency, unspecified: Secondary | ICD-10-CM

## 2010-08-16 LAB — BASIC METABOLIC PANEL
BUN: 21 mg/dL (ref 6–23)
CO2: 30 mEq/L (ref 19–32)
Calcium: 11 mg/dL — ABNORMAL HIGH (ref 8.4–10.5)
Chloride: 104 mEq/L (ref 96–112)
Creatinine, Ser: 1.1 mg/dL (ref 0.4–1.2)
GFR: 51.5 mL/min — ABNORMAL LOW (ref >60.00)
Glucose, Bld: 95 mg/dL (ref 70–99)
Potassium: 4.9 mEq/L (ref 3.5–5.1)
Sodium: 142 mEq/L (ref 135–145)

## 2010-08-16 LAB — LIPID PANEL
Cholesterol: 185 mg/dL (ref 0–200)
HDL: 91.9 mg/dL (ref >39.00)
LDL Cholesterol: 78 mg/dL (ref 0–99)
Total CHOL/HDL Ratio: 2
Triglycerides: 76 mg/dL (ref 0.0–149.0)
VLDL: 15.2 mg/dL (ref 0.0–40.0)

## 2010-08-16 LAB — HEPATIC FUNCTION PANEL
ALT: 16 U/L (ref 0–35)
AST: 21 U/L (ref 0–37)
Albumin: 3.7 g/dL (ref 3.5–5.2)
Alkaline Phosphatase: 50 U/L (ref 39–117)
Bilirubin, Direct: 0 mg/dL (ref 0.0–0.3)
Total Bilirubin: 0.3 mg/dL (ref 0.3–1.2)
Total Protein: 7.1 g/dL (ref 6.0–8.3)

## 2010-08-16 NOTE — Assessment & Plan Note (Signed)
The patient has a history of hypercholesterolemia.  She did not tolerate Lipitor in the past because it made her head feel funny.  At the present time she is not on any statin therapy and we are checking her blood work today.

## 2010-08-16 NOTE — Assessment & Plan Note (Signed)
The patient has a past history of labile hypertension.  This is controlled with low-salt diet and exercise.  She has not required antihypertensive agents.  She's not having any headaches or dizzy spells.

## 2010-08-16 NOTE — Progress Notes (Signed)
Daniel Nones Date of Birth:  1925-03-28 [facility] [address]   [address] [place], [place]  [address] [phone]           Fax   [phone]  History of Present Illness: This pleasant 77 year old woman is seen for a scheduled 6 month followup office visit. She has a past history of labile hypertension controlled with diet.  She's also had a past history of atypical chest pain.  She had a normal adenosine Cardiolite stress test in 12/03/07.  She has a history of hypercholesterolemia.  She has a history of dyspepsia.  Recently she has been evaluated by GI for symptoms of colitis and had a recent colonoscopy by Dr. Lina Sar and she is now on prednisone and is also seeing Dr. Leone Payor.She denies any recent chest pain or shortness of breath.  Her energy level has been reasonably good.  She did develop thrush recently from the prednisone and this responded to a short course of Diflucan  Current Outpatient Prescriptions  Medication Sig Dispense Refill  . Acetaminophen (TYLENOL ARTHRITIS PAIN PO) Take 2 tablets by mouth as needed. pain       . aspirin 81 MG tablet Take 81 mg by mouth daily.        Marland Kitchen BIOTIN PO Take 1 tablet by mouth daily.        . calcium carbonate (OS-CAL) 600 MG TABS Take 600 mg by mouth daily.        . calcium carbonate (TUMS EX) 750 MG chewable tablet Chew 1 tablet by mouth daily.        . Cholecalciferol (VITAMIN D) 2000 UNITS tablet Take 1,000 Units by mouth daily.       . famotidine (PEPCID) 40 MG tablet Take 1 tablet (40 mg total) by mouth daily as needed for heartburn.  30 tablet  1  . flurazepam (DALMANE) 15 MG capsule 1 to 2 at bedtime as needed  60 capsule  3  . fluticasone (FLONASE) 50 MCG/ACT nasal spray Place 1 spray into the nose as needed. Sinus drainage      . Glucosamine HCl-MSM (GLUCOSAMINE-MSM PO) Take 1 tablet by mouth 2 (two) times daily.        Marland Kitchen glycopyrrolate (ROBINUL-FORTE) 2 MG tablet Take one po every AM  30 tablet  1    . ipratropium (ATROVENT) 0.03 % nasal spray Place 1 spray into the nose as needed. Sinus drainage      . Multiple Vitamins-Minerals (MULTIVITAMIN WITH MINERALS) tablet Take 1 tablet by mouth daily.        . naproxen sodium (ANAPROX) 220 MG tablet Take 220 mg by mouth as needed. pain       . predniSONE (DELTASONE) 10 MG tablet Take 1/2 tab every other day until May 15, 2010  15 tablet  0  . predniSONE (DELTASONE) 10 MG tablet Take 20 mg (2 tabs) x 2 weeks then 15 mg (1 1/2 tab) po till office visit on 08/18/10  100 tablet  0  . Probiotic Product (SOLUBLE FIBER/PROBIOTICS PO) Take 1 tablet by mouth 2 (two) times daily with a meal. 2-3 times a day        Current Facility-Administered Medications  Medication Dose Route Frequency Provider Last Rate Last Dose  . 0.9 %  sodium chloride infusion  500 mL Intravenous Continuous Hart Carwin, MD        Allergies  Allergen Reactions  . Erythromycin Other (See Comments)  Gi upset  . Lipitor (Atorvastatin Calcium)   . Zetia (Ezetimibe)     Patient Active Problem List  Diagnoses  . GERD  . Collagenous colitis  . Odynophagia    History  Smoking status  . Never Smoker   Smokeless tobacco  . Never Used    History  Alcohol Use  . Yes    rarely    Family History  Problem Relation Age of Onset  . Breast cancer Mother   . Stroke Mother     Review of Systems: Constitutional: no fever chills diaphoresis or fatigue or change in weight.  Head and neck: no hearing loss, no epistaxis, no photophobia or visual disturbance. Respiratory: No cough, shortness of breath or wheezing. Cardiovascular: No chest pain peripheral edema, palpitations. Gastrointestinal: No abdominal distention, no abdominal pain, no change in bowel habits hematochezia or melena. Genitourinary: No dysuria, no frequency, no urgency, no nocturia. Musculoskeletal:No arthralgias, no back pain, no gait disturbance or myalgias. Neurological: No dizziness, no headaches, no  numbness, no seizures, no syncope, no weakness, no tremors. Hematologic: No lymphadenopathy, no easy bruising. Psychiatric: No confusion, no hallucinations, no sleep disturbance.    Physical Exam: Filed Vitals:   08/16/10 1004  BP: 128/78  Pulse: 78  The general appearance feels a well-developed well-nourished woman in no distress.  She is alert and cooperative.Pupils equal and reactive.   Extraocular Movements are full.  There is no scleral icterus.  The mouth and pharynx are normal.  The neck is supple.  The carotids reveal no bruits.  The jugular venous pressure is normal.  The thyroid is not enlarged.  There is no lymphadenopathy.  The chest is clear to percussion and auscultation. There are no rales or rhonchi. Expansion of the chest is symmetrical.  The precordium is quiet.  The first heart sound is normal.  The second heart sound is physiologically split.  There is no murmur gallop rub or click.  There is no abnormal lift or heave.  The abdomen is soft and nontender. Bowel sounds are normal. The liver and spleen are not enlarged. There Are no abdominal masses. There are no bruits.    Normal extremity without phlebitis or edema.  Pedal pulses are present. The skin is warm and dry.  There is no rash.  Strength is normal and symmetrical in all extremities.  There is no lateralizing weakness.  There are no sensory deficits.     Assessment / Plan: Continue on same cardiac medication.  Continue on heart healthy diet.  Continue close followup with her gastroenterologist regarding her colitis.  Recheck here in 6 months for followup office visit and fasting lab work

## 2010-08-16 NOTE — Assessment & Plan Note (Signed)
The patient has a past history of substernal chest pain.  She has had no recent episodes.  Her last nuclear stress test in December 2009 was normal.  She had a cardiac catheterization in 1983 by Dr. Glennon Hamilton which showed normal coronary arteries.

## 2010-08-17 ENCOUNTER — Encounter: Payer: Self-pay | Admitting: *Deleted

## 2010-08-17 ENCOUNTER — Telehealth: Payer: Self-pay | Admitting: *Deleted

## 2010-08-17 LAB — VITAMIN D 25 HYDROXY (VIT D DEFICIENCY, FRACTURES): Vit D, 25-Hydroxy: 43 ng/mL (ref 30–89)

## 2010-08-17 NOTE — Progress Notes (Signed)
Mailed at patient request

## 2010-08-17 NOTE — Telephone Encounter (Signed)
Mailed lab results at patient request

## 2010-08-17 NOTE — Telephone Encounter (Signed)
Mailed at patient request

## 2010-08-17 NOTE — Telephone Encounter (Signed)
Message copied by Burnell Blanks on Thu Aug 17, 2010  9:56 AM ------      Message from: Cassell Clement      Created: Thu Aug 17, 2010  9:17 AM       Please report.  The vitamin D level is 43 which is normal.  Continue same dose of vitamin D supplement

## 2010-08-17 NOTE — Telephone Encounter (Signed)
Message copied by Burnell Blanks on Thu Aug 17, 2010  9:55 AM ------      Message from: Cassell Clement      Created: Wed Aug 16, 2010  8:18 PM       Please report.  BMET good.  LFTs nl.  Lipids are good.  Cholesterol 185.  Continue prudent diet

## 2010-08-18 ENCOUNTER — Ambulatory Visit: Payer: Medicare Other | Admitting: Internal Medicine

## 2010-08-21 ENCOUNTER — Telehealth: Payer: Self-pay | Admitting: Internal Medicine

## 2010-08-21 NOTE — Telephone Encounter (Signed)
Patient has been having loose stool and lower abdominal cramping .  She is not taking her robinul.  I have advised her to continue on Robinul as ordered and keep appt with Dr Leone Payor for 09/01/10

## 2010-08-23 ENCOUNTER — Telehealth: Payer: Self-pay | Admitting: Cardiology

## 2010-08-23 NOTE — Telephone Encounter (Signed)
Pt had questions about blood work results she received in the mail. Please call

## 2010-08-23 NOTE — Telephone Encounter (Signed)
Called patient back and went over labs

## 2010-08-25 ENCOUNTER — Telehealth: Payer: Self-pay | Admitting: Internal Medicine

## 2010-08-25 NOTE — Telephone Encounter (Signed)
the patient called to report she hasn't had a BM since 3:00 on Tuesday afternoon she wants to know if she can take a stool softener.  She is advised this is fine and she should keep her appt with Dr Leone Payor for next week.

## 2010-09-01 ENCOUNTER — Ambulatory Visit (INDEPENDENT_AMBULATORY_CARE_PROVIDER_SITE_OTHER): Payer: Medicare Other | Admitting: Internal Medicine

## 2010-09-01 ENCOUNTER — Encounter: Payer: Self-pay | Admitting: Internal Medicine

## 2010-09-01 ENCOUNTER — Other Ambulatory Visit (INDEPENDENT_AMBULATORY_CARE_PROVIDER_SITE_OTHER): Payer: Medicare Other

## 2010-09-01 ENCOUNTER — Telehealth: Payer: Self-pay | Admitting: Internal Medicine

## 2010-09-01 DIAGNOSIS — K5289 Other specified noninfective gastroenteritis and colitis: Secondary | ICD-10-CM

## 2010-09-01 DIAGNOSIS — R1310 Dysphagia, unspecified: Secondary | ICD-10-CM

## 2010-09-01 DIAGNOSIS — K52831 Collagenous colitis: Secondary | ICD-10-CM

## 2010-09-01 LAB — IGA: IgA: 339 mg/dL (ref 68–378)

## 2010-09-01 MED ORDER — PREDNISONE 10 MG PO TABS: 10.0000 mg | ORAL_TABLET | Freq: Every day | ORAL | Status: DC

## 2010-09-01 MED ORDER — FLUCONAZOLE 100 MG PO TABS: 100.0000 mg | ORAL_TABLET | Freq: Every day | ORAL | Status: AC

## 2010-09-01 MED ORDER — SUCRALFATE 1 G PO TABS: ORAL_TABLET | ORAL | Status: DC

## 2010-09-01 NOTE — Assessment & Plan Note (Addendum)
+   oral thrush on exam and responded to fluconazole in past I am not certain she has esophageal Candidiasis but think treating with fluconazole again reasonable especially with continued and higher prednisone

## 2010-09-01 NOTE — Telephone Encounter (Signed)
Patient wonders how long she will be on gluten free diet. Please, advise.

## 2010-09-01 NOTE — Progress Notes (Signed)
  Subjective:    Patient ID: Beth Wise, female    DOB: [birth year], 77 y.o.   MRN: [id number]  HPI Diagnosis of collagenous colitis x several years. Recent repeat endoscopic exam and restarted on prednisone. Has been using porednisone off ad on for a long time. Has had a variable response, is better than earlier this year but still does not feel like it is resolved.  Having formed and loose stools, though only two formed stools as of late.  Overall pattern is AM stool with or after breakfast but has had some early AM stools. Then again in evening. Overall does not usually have more than 4-5 stools a day. Not much abdominal pain on glycopyrrolate bid. Mild odynophagia today at lunch. Sometimes gets the urge to defecate and is unable associated with nausea and vomiting small amounts and then defecation -about 5-6 x this summer. A lot of nausea overall. Does not think the prednisone has helped.  Past Medical History  Diagnosis Date  . Allergy   . Arthritis   . GERD (gastroesophageal reflux disease)   . Heart murmur   . Hypertension   . Chest pain, atypical   . Hyperlipidemia   . Back pain   . History of fibromyalgia   . Diverticulosis   . Hiatal hernia 2010    EGD  . Candidal esophagitis 2010    EGD   . Collagenous colitis    Past Surgical History  Procedure Date  . Cataract extraction w/ intraocular lens  implant, bilateral   . Upper gastrointestinal endoscopy     esophagitis  . Colonoscopy   . Abdominal hysterectomy   . Breast biopsy     left benign  . Tonsillectomy and adenoidectomy   . US echocardiography 10/30/2007     EF 55-60%  . Cardiovascular stress test 12/03/2007    EF 72%    reports that she has never smoked. She has never used smokeless tobacco. She reports that she drinks alcohol. She reports that she does not use illicit drugs. family history includes Breast cancer in her mother; Colon polyps in her brother; and Stroke in her mother.  There is no history  of Colon cancer. Allergies  Allergen Reactions  . Erythromycin Other (See Comments)    Gi upset  . Lipitor (Atorvastatin Calcium)   . Zetia (Ezetimibe)        Review of Systems Fatigue, legs weak at times.    Objective:   Physical Exam Obese, NAD Oral Thrush seen Abdomen is soft and nontender BS + Lungs clear Heart S1 S2 no murmur Skin a few purpuric spots forearms       Assessment & Plan:

## 2010-09-01 NOTE — Assessment & Plan Note (Addendum)
Trial of higher prednisone to see what happens. I think there is underlying IBS. Check TTG ab - ? Celiac disease - unlikely but worth looking for as associated with collagenous colitis Low fiber diet See me in 1 month We discussed goals of treatment and that improving quality of life with symptomatic and/or disease modifying therapy was the approach I planned.

## 2010-09-01 NOTE — Patient Instructions (Signed)
Please go to the basement upon leaving today to have your labs done. Your prescription(s) has(have) been sent to your pharmacy for you to pick up (Fluconazole, Prednisone). Take prednisone as written on your new prescription. Take Carafate as prescribed by your other physician. Low Fiber Diet handout given for you to read. Return to see Dr. Leone Payor in 1 month.

## 2010-09-05 LAB — TISSUE TRANSGLUTAMINASE, IGA: Tissue Transglutaminase Ab, IgA: 2.4 U/mL (ref <20)

## 2010-09-05 NOTE — Telephone Encounter (Signed)
Patient notified

## 2010-09-05 NOTE — Telephone Encounter (Signed)
Patient advised.

## 2010-09-05 NOTE — Telephone Encounter (Signed)
I have not recommended a gluten-free diet - only tested for celiac disease

## 2010-09-06 NOTE — Progress Notes (Signed)
Quick Note:  Let her know that she does not have celiac disease Stick with plans as outline in instructions of last visit ______

## 2010-09-11 ENCOUNTER — Other Ambulatory Visit: Payer: Self-pay | Admitting: Internal Medicine

## 2010-09-11 NOTE — Telephone Encounter (Signed)
Patient called requesting a refill on Robinol stating she was on it twice a day. According to last OV it seems that the medication was stopped. Patient is complaining of having 3 formed stools and then having diarrhea and question is this normal. Should we refill the Robinol for twice a day?

## 2010-09-12 MED ORDER — GLYCOPYRROLATE 2 MG PO TABS: 2.0000 mg | ORAL_TABLET | Freq: Two times a day (BID) | ORAL | Status: DC

## 2010-09-12 NOTE — Telephone Encounter (Signed)
Ok to refill robinul bid #60 with 2 refills

## 2010-09-12 NOTE — Telephone Encounter (Signed)
Medication refilled. Message left on patient's voicemail that medication was filled.

## 2010-09-13 ENCOUNTER — Telehealth: Payer: Self-pay | Admitting: Internal Medicine

## 2010-09-13 NOTE — Telephone Encounter (Signed)
All questions answered

## 2010-09-25 ENCOUNTER — Telehealth: Payer: Self-pay | Admitting: Internal Medicine

## 2010-09-25 NOTE — Telephone Encounter (Signed)
All questions about her medications answered.  She is asked to remain on prednisone until she sees Dr Leone Payor on 10/12/10 as instructed.

## 2010-10-04 ENCOUNTER — Telehealth: Payer: Self-pay | Admitting: Internal Medicine

## 2010-10-04 NOTE — Telephone Encounter (Signed)
Taper off prednisone 1/2 tablet daily x 1 week then 1/2 tablet every other day x 1 week then stop She should see PCP about other problems Stop/hold Robinul and please cc this to her PCP I will see her as scheduled

## 2010-10-04 NOTE — Telephone Encounter (Signed)
Patient is wanting to stop prednisone.  Her tongue is sore and raw she also has an irritation in her throat. She fell yesterday and she believes that the dizziness is coming from the prednisone.   She is no longer having loose stool.  She is having constipation now.  She is currently taking Robinul BID, prednisone 10 mg  and carafate and a sleeping pill at HS.  I have advised the patient that Robinul and carafate are both constipating.  She denies any further cramping or abdominal pain.  I have asked her to try and stop robinul, she is asked to resume if her cramping returns.  She is advised that some of her oral and throat symptoms may be from dry mouth from side effects of the robinul.  She also wants to stop her sleeping pills at night as she feels this makes her mouth dry also, I have asked her to speak with her primary care MD about the sleeping pills.  She would like to stop the prednisone.  I have explained to her that she can't abruptly stop prednisone she should try cutting out the robinul first until I can speak with Dr Leone Payor and keep her appt with Dr Leone Payor next week.  Dr Leone Payor do you want to decrease her prednisone dose?

## 2010-10-05 NOTE — Telephone Encounter (Signed)
Patient advised of Dr Marvell Fuller recommendations.  She has an appt with her primary care MD tomorrow and she is asked to keep her appt with Dr Leone Payor for next week

## 2010-10-11 ENCOUNTER — Telehealth: Payer: Self-pay | Admitting: Internal Medicine

## 2010-10-11 NOTE — Telephone Encounter (Signed)
Patient had some loose stools yesterday.  She is advised to take her robinul prn.  She will keep her appt for 10/12/10

## 2010-10-12 ENCOUNTER — Encounter: Payer: Self-pay | Admitting: Internal Medicine

## 2010-10-12 ENCOUNTER — Ambulatory Visit (INDEPENDENT_AMBULATORY_CARE_PROVIDER_SITE_OTHER): Payer: Medicare Other | Admitting: Internal Medicine

## 2010-10-12 DIAGNOSIS — K52831 Collagenous colitis: Secondary | ICD-10-CM

## 2010-10-12 DIAGNOSIS — K589 Irritable bowel syndrome without diarrhea: Secondary | ICD-10-CM | POA: Insufficient documentation

## 2010-10-12 DIAGNOSIS — K5289 Other specified noninfective gastroenteritis and colitis: Secondary | ICD-10-CM

## 2010-10-12 MED ORDER — LOPERAMIDE HCL 2 MG PO TABS: 2.0000 mg | ORAL_TABLET | Freq: Four times a day (QID) | ORAL | Status: AC | PRN

## 2010-10-12 MED ORDER — PREDNISONE 10 MG PO TABS: 5.0000 mg | ORAL_TABLET | Freq: Every day | ORAL | Status: AC

## 2010-10-12 MED ORDER — GLYCOPYRROLATE 2 MG PO TABS: 2.0000 mg | ORAL_TABLET | Freq: Two times a day (BID) | ORAL | Status: DC

## 2010-10-12 NOTE — Assessment & Plan Note (Signed)
Improved - now has an IBS -like pattern Will taper off prednisone as planned and observe for persistent watery diarrhea

## 2010-10-12 NOTE — Assessment & Plan Note (Signed)
Continue with glycopyrrolate but prn Add loperamide prn Explained IBS as a diagnosis and problem in addition to collagenous colitis

## 2010-10-12 NOTE — Progress Notes (Signed)
  Subjective:    Patient ID: Beth Wise, female    DOB: 1925-08-24, 77 y.o.   MRN: [id number]  HPI At least 4 "relapses" since seen 09/01/10. Started on higher dose prednisone then and has been tapering. In between these spells she has had regular formed bowel movements. Spells of diarrhea may last about 1-3 days. Some associated cramping which is relieved by defecation. Some asociated nausea and bloating. She tried ginger tea with benefit.  Review of Systems She fell the other week and had dry mouth, she stopped her sleeping pill.    Objective:   Physical Exam        Assessment & Plan:

## 2010-10-12 NOTE — Patient Instructions (Signed)
Taper off your prednisone as discussed on your medication list summary by the 19th. Take Imodium AD as on your medication list. If you develop persistent diarrhea again in a week call us back.

## 2010-10-18 ENCOUNTER — Encounter: Payer: Self-pay | Admitting: Internal Medicine

## 2010-10-18 DIAGNOSIS — D638 Anemia in other chronic diseases classified elsewhere: Secondary | ICD-10-CM | POA: Insufficient documentation

## 2010-10-26 ENCOUNTER — Telehealth: Payer: Self-pay | Admitting: Internal Medicine

## 2010-10-26 NOTE — Telephone Encounter (Signed)
Placed a call to the patient and left a return call message.

## 2010-10-26 NOTE — Telephone Encounter (Signed)
Talked to the patient and she stated that she took her last dose of prednisone last Friday and she have started Iron pills and is still having constant  dark diarrhea, 4 BMs this AM until 10:30. Patient was told that iron will cause dark stools. Patient also stated that she is taking Carafate and question is there anything else she can take at night because Carafate causes her to constantly get up during the night urinating. Patient states that she know someone that has IBS and takes Colestipol and it works Adult nurse, patient wants to know what you think about medication and would it work for her?

## 2010-10-27 ENCOUNTER — Encounter: Payer: Self-pay | Admitting: Internal Medicine

## 2010-10-27 NOTE — Telephone Encounter (Signed)
1) Hold iron and carafate - do not take until I tell her too 2) Should take loperamide 2 mg one tab daily (regularly) and skip a day if no bowel movement (If no bowel movement in 24 hours do not take the loperamide (Imodium) until bowels start moving again 3) Have her call back with an update in 5-7 days - she needs to record # and consistency of stools she has

## 2010-10-27 NOTE — Telephone Encounter (Signed)
Patient advised of Dr. Marvell Fuller orders and suggestions. Patient called wanting to ask Dr. Leone Payor if it would be safe for her to take Zantac 150 mg for her heartburn?

## 2010-10-27 NOTE — Telephone Encounter (Signed)
Left a message on patient voicemail to return my call

## 2010-10-29 NOTE — Telephone Encounter (Signed)
Zantac ok

## 2010-10-31 NOTE — Telephone Encounter (Signed)
Patient informed. 

## 2010-11-04 ENCOUNTER — Other Ambulatory Visit (HOSPITAL_COMMUNITY): Payer: Self-pay | Admitting: *Deleted

## 2010-11-14 ENCOUNTER — Encounter (HOSPITAL_COMMUNITY): Payer: Self-pay

## 2010-11-14 ENCOUNTER — Encounter (HOSPITAL_COMMUNITY)
Admission: RE | Admit: 2010-11-14 | Discharge: 2010-11-14 | Disposition: A | Discharge status: Home / Self Care | Payer: Medicare Other | Source: Ambulatory Visit | Attending: Internal Medicine | Admitting: Internal Medicine

## 2010-11-14 DIAGNOSIS — D649 Anemia, unspecified: Secondary | ICD-10-CM | POA: Insufficient documentation

## 2010-11-14 MED ORDER — FERUMOXYTOL INJECTION 510 MG/17 ML
510.0000 mg | Freq: Once | INTRAVENOUS | Status: AC
  Administered 2010-11-14: 510 mg via INTRAVENOUS
  Filled 2010-11-14: qty 17

## 2010-11-14 MED ORDER — SODIUM CHLORIDE 0.9 % IV SOLN
INTRAVENOUS | Status: AC
  Administered 2010-11-14: 14:00:00 via INTRAVENOUS

## 2010-11-21 ENCOUNTER — Other Ambulatory Visit (HOSPITAL_COMMUNITY): Payer: Self-pay | Admitting: *Deleted

## 2010-11-21 ENCOUNTER — Encounter (HOSPITAL_COMMUNITY)
Admission: RE | Admit: 2010-11-21 | Discharge: 2010-11-21 | Disposition: A | Discharge status: Home / Self Care | Payer: Medicare Other | Source: Ambulatory Visit | Attending: Internal Medicine | Admitting: Internal Medicine

## 2010-11-21 ENCOUNTER — Encounter (HOSPITAL_COMMUNITY): Payer: Self-pay

## 2010-11-21 MED ORDER — SODIUM CHLORIDE 0.9 % IV SOLN
INTRAVENOUS | Status: DC
  Administered 2010-11-21: 15:00:00 via INTRAVENOUS

## 2010-11-21 MED ORDER — FERUMOXYTOL INJECTION 510 MG/17 ML
510.0000 mg | INTRAVENOUS | Status: AC
  Administered 2010-11-21: 510 mg via INTRAVENOUS
  Filled 2010-11-21 (×2): qty 17

## 2010-11-29 ENCOUNTER — Telehealth: Payer: Self-pay | Admitting: Internal Medicine

## 2010-11-29 NOTE — Telephone Encounter (Signed)
Patient reports that she is still having diarrhea.  She has stopped her glycopyrolate.  I have advised her to continue Imodium daily (per same instructions in telephone note from 10/26/10) and to use glycopyrolate BID PRN.  Patient verbalized understanding.

## 2011-02-13 ENCOUNTER — Encounter: Payer: Self-pay | Admitting: Cardiology

## 2011-02-16 ENCOUNTER — Encounter: Payer: Self-pay | Admitting: Cardiology

## 2011-02-22 ENCOUNTER — Encounter: Payer: Self-pay | Admitting: Cardiology

## 2011-02-22 ENCOUNTER — Ambulatory Visit (INDEPENDENT_AMBULATORY_CARE_PROVIDER_SITE_OTHER): Payer: Medicare Other | Admitting: Cardiology

## 2011-02-22 ENCOUNTER — Ambulatory Visit: Payer: Medicare Other | Admitting: Cardiology

## 2011-02-22 ENCOUNTER — Other Ambulatory Visit: Payer: Medicare Other

## 2011-02-22 VITALS — BP 130/78 | HR 70 | Ht 64.0 in | Wt 151.0 lb

## 2011-02-22 DIAGNOSIS — I059 Rheumatic mitral valve disease, unspecified: Secondary | ICD-10-CM

## 2011-02-22 DIAGNOSIS — I358 Other nonrheumatic aortic valve disorders: Secondary | ICD-10-CM

## 2011-02-22 DIAGNOSIS — M1990 Unspecified osteoarthritis, unspecified site: Secondary | ICD-10-CM

## 2011-02-22 DIAGNOSIS — K529 Noninfective gastroenteritis and colitis, unspecified: Secondary | ICD-10-CM

## 2011-02-22 DIAGNOSIS — K5289 Other specified noninfective gastroenteritis and colitis: Secondary | ICD-10-CM

## 2011-02-22 DIAGNOSIS — I10 Essential (primary) hypertension: Secondary | ICD-10-CM

## 2011-02-22 DIAGNOSIS — R0989 Other specified symptoms and signs involving the circulatory and respiratory systems: Secondary | ICD-10-CM

## 2011-02-22 DIAGNOSIS — K52831 Collagenous colitis: Secondary | ICD-10-CM

## 2011-02-22 DIAGNOSIS — E7800 Pure hypercholesterolemia, unspecified: Secondary | ICD-10-CM

## 2011-02-22 DIAGNOSIS — I359 Nonrheumatic aortic valve disorder, unspecified: Secondary | ICD-10-CM

## 2011-02-22 DIAGNOSIS — I340 Nonrheumatic mitral (valve) insufficiency: Secondary | ICD-10-CM | POA: Insufficient documentation

## 2011-02-22 NOTE — Assessment & Plan Note (Signed)
Recently the patient has been under the care of Dr. Merry Lofty has her on several dietary supplements.  She has felt better since being on this new regimen.  She has more energy.  It is what is called a "wellness diet".  She anticipates that loss we'll stop now that she is on a better diet she does have a history of anemia.  She reports that since we last saw her she has been given packed cells twice by her primary care provider Dr. Jarome Matin.

## 2011-02-22 NOTE — Progress Notes (Signed)
Beth Wise Date of Birth:  12/17/1925 [facility] [address] [place], [place]  [address] [phone]         Fax   [phone]  History of Present Illness: This pleasant 78 year old woman is seen for a six-month followup office visit.  She has a past history of labile hypertension.  She has a history of hypercholesterolemia and a history of atypical chest pain.  She had a normal adenosine Cardiolite stress test in December 2009.  She has a history of colitis and is followed by Dr. Leone Payor  . she has had a 6 pound weight loss since her last visit here which she attributes to the colitis.  Current Outpatient Prescriptions  Medication Sig Dispense Refill  . aspirin 81 MG tablet Take 81 mg by mouth daily.        Marland Kitchen BIOTIN PO Take 1 tablet by mouth daily.        . calcium carbonate (TUMS EX) 750 MG chewable tablet Chew 1 tablet by mouth daily.        . Cholecalciferol (VITAMIN D) 2000 UNITS tablet Take 1,000 Units by mouth daily.       . flurazepam (DALMANE) 15 MG capsule Take 15 mg by mouth at bedtime as needed.      . fluticasone (FLONASE) 50 MCG/ACT nasal spray Place 1 spray into the nose as needed. Sinus drainage      . Glucosamine HCl-MSM (GLUCOSAMINE-MSM PO) Take 1 tablet by mouth 2 (two) times daily.        . Multiple Vitamins-Minerals (MULTIVITAMIN WITH MINERALS) tablet Take 1 tablet by mouth daily.        . naproxen sodium (ANAPROX) 220 MG tablet Take 220 mg by mouth as needed. pain       . Simethicone (GAS-X PO) Take by mouth as needed.          Allergies  Allergen Reactions  . Erythromycin Other (See Comments)    Gi upset  . Lipitor (Atorvastatin Calcium)   . Zetia (Ezetimibe)     Patient Active Problem List  Diagnoses  . GERD  . Collagenous colitis  . Odynophagia  . Labile hypertension  . Hypercholesterolemia  . Chest pain  . IBS (irritable bowel syndrome)  . Anemia of chronic disease    History  Smoking status  . Never Smoker     Smokeless tobacco  . Never Used    History  Alcohol Use  . Yes    rarely    Family History  Problem Relation Age of Onset  . Breast cancer Mother   . Stroke Mother   . Colon cancer Neg Hx   . Colon polyps Brother   . Heart disease Sister     and brother    Review of Systems: Constitutional: no fever chills diaphoresis or fatigue or change in weight.  Head and neck: no hearing loss, no epistaxis, no photophobia or visual disturbance. Respiratory: No cough, shortness of breath or wheezing. Cardiovascular: No chest pain peripheral edema, palpitations. Gastrointestinal: No abdominal distention, no abdominal pain, no change in bowel habits hematochezia or melena. Genitourinary: No dysuria, no frequency, no urgency, no nocturia. Musculoskeletal:No arthralgias, no back pain, no gait disturbance or myalgias. Neurological: No dizziness, no headaches, no numbness, no seizures, no syncope, no weakness, no tremors. Hematologic: No lymphadenopathy, no easy bruising. Psychiatric: No confusion, no hallucinations, no sleep disturbance.    Physical Exam: Filed Vitals:   02/22/11 1022  BP: 130/78  Pulse:  70   the general appearance reveals a well-developed well-nourished woman in no distress.Pupils equal and reactive.   Extraocular Movements are full.  There is no scleral icterus.  The mouth and pharynx are normal.  The neck is supple.  The carotids reveal no bruits.  The jugular venous pressure is normal.  The thyroid is not enlarged.  There is no lymphadenopathy.  The chest is clear to percussion and auscultation. There are no rales or rhonchi. Expansion of the chest is symmetrical.  The heart reveals a soft systolic ejection murmur at the base and a holosystolic murmur at the apex.  No gallop or rubThe abdomen is soft and nontender. Bowel sounds are normal. The liver and spleen are not enlarged. There Are no abdominal masses. There are no bruits.  The pedal pulses are good.  There is  no phlebitis or edema.  There is no cyanosis or clubbing. Strength is normal and symmetrical in all extremities.  There is no lateralizing weakness.  There are no sensory deficits.  The skin is warm and dry.  There is no rash.  EKG today shows normal sinus rhythm with first degree AV block and no ischemic changes.  The first degree AV block is new since the previous EKG of 10/15/08.   Assessment / Plan: The patient is to continue same medication.  She is looking and feeling well.  We will plan to recheck her in 6 months for followup office visit.  We would anticipate that her weight would stabilize on her wellness diet now.

## 2011-02-22 NOTE — Patient Instructions (Signed)
Your physician wants you to follow-up in:  6 months. You will receive a reminder letter in the mail two months in advance. If you don't receive a letter, please call our office to schedule the follow-up appointment.   

## 2011-02-22 NOTE — Assessment & Plan Note (Signed)
The patient has a history of hypercholesterolemia.  She has been getting her recent labs through Dr. Henreitta Cea office.

## 2011-02-22 NOTE — Assessment & Plan Note (Signed)
The patient has a history of a heart murmur.  Her previous echocardiogram had shown in October of 2009 and she had mild aortic sclerosis with moderate aortic insufficiency as well as mild mitral regurgitation and she had normal pulmonary artery pressure.  She also was shown to have mild LVH with normal systolic function and with impaired relaxation.  She has not been having any symptoms to suggest congestive heart failure.

## 2011-02-22 NOTE — Assessment & Plan Note (Signed)
Her blood pressure has been stable on current therapy.  She's not had any dizzy spells or syncope.  She's not been aware of any palpitations.

## 2011-02-27 NOTE — Progress Notes (Signed)
Addended by: Judithe Modest D on: 02/27/2011 05:26 PM   Modules accepted: Orders

## 2011-03-13 ENCOUNTER — Encounter: Payer: Self-pay | Admitting: Internal Medicine

## 2011-07-09 ENCOUNTER — Telehealth: Payer: Self-pay | Admitting: Cardiology

## 2011-07-09 NOTE — Telephone Encounter (Signed)
New msg Pt called about Blood she is having done tomorrow at pcp. She wanted to know if can add labs so she wont have to do twice.

## 2011-07-09 NOTE — Telephone Encounter (Signed)
Patient called stated she is having lab work at Dr.Wanek's office tomorrow 07/10/11 and wanted to know if Dr.Brackbill wanted any lab work done.Patient was told Dr.Brackbill not in office this afternoon.Patient was told there is a order in chart to have bmet,lipids,hepatic 08/16/11.Patient wanted to check with Dr.Brackbill's nurse. Will send message to Dr.Brackbill's nurse and she will need to call patient back by 8:30 am 07/10/11.

## 2011-07-10 ENCOUNTER — Encounter: Payer: Self-pay | Admitting: Cardiology

## 2011-07-10 NOTE — Telephone Encounter (Signed)
Spoke with PCP office and advised  Dr. Patty Sermons had only ordered bmet,hfp,lp labs

## 2011-08-22 ENCOUNTER — Ambulatory Visit: Payer: Medicare Other | Admitting: Cardiology

## 2011-09-05 ENCOUNTER — Ambulatory Visit (INDEPENDENT_AMBULATORY_CARE_PROVIDER_SITE_OTHER): Payer: Medicare Other | Admitting: Cardiology

## 2011-09-05 ENCOUNTER — Encounter: Payer: Self-pay | Admitting: Cardiology

## 2011-09-05 VITALS — BP 120/68 | HR 78 | Ht 64.0 in | Wt 155.0 lb

## 2011-09-05 DIAGNOSIS — I119 Hypertensive heart disease without heart failure: Secondary | ICD-10-CM

## 2011-09-05 DIAGNOSIS — R0989 Other specified symptoms and signs involving the circulatory and respiratory systems: Secondary | ICD-10-CM

## 2011-09-05 DIAGNOSIS — I10 Essential (primary) hypertension: Secondary | ICD-10-CM

## 2011-09-05 DIAGNOSIS — E7800 Pure hypercholesterolemia, unspecified: Secondary | ICD-10-CM

## 2011-09-05 DIAGNOSIS — I059 Rheumatic mitral valve disease, unspecified: Secondary | ICD-10-CM

## 2011-09-05 DIAGNOSIS — I340 Nonrheumatic mitral (valve) insufficiency: Secondary | ICD-10-CM

## 2011-09-05 NOTE — Progress Notes (Signed)
Daniel Nones Date of Birth:  25-Dec-1925 [facility] [address] [place], [place]  [address] [phone]         Fax   [phone]  History of Present Illness: This pleasant 78 year old woman is seen for a six-month followup office visit. She has a past history of labile hypertension. She has a history of hypercholesterolemia and a history of atypical chest pain. She had a normal adenosine Cardiolite stress test in December 2009.  She hasn't had an echocardiogram 11/01/07 showing mild LVH with an ejection fraction of 55-60% and grade 1 diastolic dysfunction.  There was moderate aortic insufficiency and mild mitral regurgitation. She has a history of colitis and is followed by Dr. Leone Payor.  Since last visit she has been feeling well.  Her GI symptoms have been quiescent.  She's not having any chest pain.   Current Outpatient Prescriptions  Medication Sig Dispense Refill  . aspirin 81 MG tablet Take 81 mg by mouth daily.        Marland Kitchen BIOTIN PO Take 1 tablet by mouth daily.        . calcium carbonate (TUMS EX) 750 MG chewable tablet Chew 1 tablet by mouth daily.        . Cholecalciferol (VITAMIN D) 2000 UNITS tablet Take 1,000 Units by mouth daily.       . flurazepam (DALMANE) 15 MG capsule Take 15 mg by mouth at bedtime as needed.      . fluticasone (FLONASE) 50 MCG/ACT nasal spray Place 1 spray into the nose as needed. Sinus drainage      . Multiple Vitamins-Minerals (MULTIVITAMIN WITH MINERALS) tablet Take 1 tablet by mouth daily.        . naproxen sodium (ANAPROX) 220 MG tablet Take 220 mg by mouth as needed. pain       . Simethicone (GAS-X PO) Take by mouth as needed.        . neomycin-polymyxin b-dexamethasone (MAXITROL) 3.5-10000-0.1 OINT as directed.        Allergies  Allergen Reactions  . Erythromycin Other (See Comments)    Gi upset  . Lipitor (Atorvastatin Calcium)   . Zetia (Ezetimibe)     Patient Active Problem List  Diagnosis  . GERD  .  Collagenous colitis  . Odynophagia  . Labile hypertension  . Hypercholesterolemia  . Chest pain  . IBS (irritable bowel syndrome)  . Anemia of chronic disease  . Mitral regurgitation    History  Smoking status  . Never Smoker   Smokeless tobacco  . Never Used    History  Alcohol Use  . Yes    rarely    Family History  Problem Relation Age of Onset  . Breast cancer Mother   . Stroke Mother   . Colon cancer Neg Hx   . Colon polyps Brother   . Heart disease Sister     and brother    Review of Systems: Constitutional: no fever chills diaphoresis or fatigue or change in weight.  Head and neck: no hearing loss, no epistaxis, no photophobia or visual disturbance. Respiratory: No cough, shortness of breath or wheezing. Cardiovascular: No chest pain peripheral edema, palpitations. Gastrointestinal: No abdominal distention, no abdominal pain, no change in bowel habits hematochezia or melena. Genitourinary: No dysuria, no frequency, no urgency, no nocturia. Musculoskeletal:No arthralgias, no back pain, no gait disturbance or myalgias. Neurological: No dizziness, no headaches, no numbness, no seizures, no syncope, no weakness, no tremors. Hematologic: No lymphadenopathy, no easy  bruising. Psychiatric: No confusion, no hallucinations, no sleep disturbance.    Physical Exam: Filed Vitals:   09/05/11 1443  BP: 120/68  Pulse: 78   the general appearance reveals a well-developed well-nourished elderly woman in no distress.The head and neck exam reveals pupils equal and reactive.  Extraocular movements are full.  There is no scleral icterus.  The mouth and pharynx are normal.  The neck is supple.  The carotids reveal no bruits.  The jugular venous pressure is normal.  The  thyroid is not enlarged.  There is no lymphadenopathy.  The chest is clear to percussion and auscultation.  There are no rales or rhonchi.  Expansion of the chest is symmetrical.  The precordium is quiet.  The  first heart sound is normal.  The second heart sound is physiologically split.  There is no  gallop rub or click.  There is a soft systolic murmur at the left sternal edge.  There is no abnormal lift or heave.  The abdomen is soft and nontender.  The bowel sounds are normal.  The liver and spleen are not enlarged.  There are no abdominal masses.  There are no abdominal bruits.  Extremities reveal good pedal pulses.  There is no phlebitis or edema.  There is no cyanosis or clubbing.  Strength is normal and symmetrical in all extremities.  There is no lateralizing weakness.  There are no sensory deficits.  The skin is warm and dry.  There is no rash.     Assessment / Plan: Continue on same medication.  Recheck in 6 months for followup office visit and EKG.

## 2011-09-05 NOTE — Patient Instructions (Addendum)
Your physician recommends that you continue on your current medications as directed. Please refer to the Current Medication list given to you today.  Your physician wants you to follow-up in: 6 month ov/ekg You will receive a reminder letter in the mail two months in advance. If you don't receive a letter, please call our office to schedule the follow-up appointment.  

## 2011-09-05 NOTE — Assessment & Plan Note (Signed)
The patient is not expressing any orthopnea or paroxysmal nocturnal dyspnea or ankle edema or other evidence of CHF.  She had an echocardiogram 11/01/07 which showed mild LVH with an ejection fraction of 55-60% and grade 1 diastolic dysfunction.  There was moderate aortic insufficiency and mild mitral regurgitation.

## 2011-09-05 NOTE — Assessment & Plan Note (Signed)
The patient has a past history of hypercholesterolemia.  Her lipids are followed by her primary care provider

## 2011-09-05 NOTE — Assessment & Plan Note (Signed)
The patient has not been experiencing any chest pain. 

## 2011-09-05 NOTE — Assessment & Plan Note (Signed)
The patient exercises regularly out at [facility].  She does enjoy swimming in the pool and also exercise classes.  She has not been having any headaches or dizziness.  No palpitations.  No symptoms of congestive heart failure.

## 2012-07-24 ENCOUNTER — Encounter: Payer: Self-pay | Admitting: Cardiology

## 2012-07-24 ENCOUNTER — Ambulatory Visit (INDEPENDENT_AMBULATORY_CARE_PROVIDER_SITE_OTHER): Payer: Medicare Other | Admitting: Cardiology

## 2012-07-24 VITALS — BP 122/76 | HR 72 | Ht 64.0 in | Wt 140.0 lb

## 2012-07-24 DIAGNOSIS — I059 Rheumatic mitral valve disease, unspecified: Secondary | ICD-10-CM

## 2012-07-24 DIAGNOSIS — I340 Nonrheumatic mitral (valve) insufficiency: Secondary | ICD-10-CM

## 2012-07-24 DIAGNOSIS — R0989 Other specified symptoms and signs involving the circulatory and respiratory systems: Secondary | ICD-10-CM

## 2012-07-24 DIAGNOSIS — K589 Irritable bowel syndrome without diarrhea: Secondary | ICD-10-CM

## 2012-07-24 DIAGNOSIS — I119 Hypertensive heart disease without heart failure: Secondary | ICD-10-CM

## 2012-07-24 DIAGNOSIS — I10 Essential (primary) hypertension: Secondary | ICD-10-CM

## 2012-07-24 NOTE — Assessment & Plan Note (Signed)
Patient attributes some of her weight loss to having had previous diarrhea.  The diarrhea has now improved.  She is on a careful diet.

## 2012-07-24 NOTE — Assessment & Plan Note (Signed)
No headaches or dizziness.  Blood pressure has been remaining stable on current therapy

## 2012-07-24 NOTE — Patient Instructions (Signed)
Your physician recommends that you continue on your current medications as directed. Please refer to the Current Medication list given to you today.  Your physician wants you to follow-up in: 6 months. You will receive a reminder letter in the mail two months in advance. If you don't receive a letter, please call our office to schedule the follow-up appointment.  

## 2012-07-24 NOTE — Assessment & Plan Note (Signed)
The patient is not having any symptoms of CHF 

## 2012-07-24 NOTE — Progress Notes (Signed)
Beth Wise Date of Birth:  March 06, 1925 [facility] [address] [place], [place]  [address] [phone]         Fax   [phone]  History of Present Illness: This pleasant 79 year old woman is seen for a six-month followup office visit. She has a past history of labile hypertension. She has a history of hypercholesterolemia and a history of atypical chest pain. She had a normal adenosine Cardiolite stress test in December 2009. She had an echocardiogram 11/01/07 showing mild LVH with an ejection fraction of 55-60% and grade 1 diastolic dysfunction. There was moderate aortic insufficiency and mild mitral regurgitation. She has a history of colitis and is followed by Dr. Leone Payor. Since last visit she has been feeling well. Her GI symptoms have been quiescent. She's not having any chest pain.  She has been on a careful diet and her weight is down 15 pounds since last visit.  Current Outpatient Prescriptions  Medication Sig Dispense Refill  . Fexofenadine HCl (ALLEGRA PO) Take by mouth daily.      . flurazepam (DALMANE) 15 MG capsule Take 15 mg by mouth at bedtime as needed.      . fluticasone (FLONASE) 50 MCG/ACT nasal spray Place 1 spray into the nose as needed. Sinus drainage      . GuaiFENesin (MUCINEX PO) Take by mouth as needed.      . Multiple Vitamins-Minerals (MULTIVITAMIN WITH MINERALS) tablet Take 4 tablets by mouth. 3 times a week      . naproxen sodium (ANAPROX) 220 MG tablet Take 220 mg by mouth as needed. pain       . neomycin-polymyxin b-dexamethasone (MAXITROL) 3.5-10000-0.1 OINT as directed.      . Omega-3 Fatty Acids (FISH OIL PO) Take by mouth. 5 days a week      . Simethicone (GAS-X PO) Take by mouth as needed.         No current facility-administered medications for this visit.    Allergies  Allergen Reactions  . Erythromycin Other (See Comments)    Gi upset  . Lipitor (Atorvastatin Calcium)   . Zetia (Ezetimibe)     Patient Active  Problem List   Diagnosis Date Noted  . Mitral regurgitation 02/22/2011  . Anemia of chronic disease 10/18/2010  . IBS (irritable bowel syndrome) 10/12/2010  . Labile hypertension 08/16/2010  . Hypercholesterolemia 08/16/2010  . Chest pain 08/16/2010  . Odynophagia 08/04/2010  . GERD 04/03/2007  . Collagenous colitis 04/03/2007    History  Smoking status  . Never Smoker   Smokeless tobacco  . Never Used    History  Alcohol Use  . Yes    Comment: rarely    Family History  Problem Relation Age of Onset  . Breast cancer Mother   . Stroke Mother   . Colon cancer Neg Hx   . Colon polyps Brother   . Heart disease Sister     and brother    Review of Systems: Constitutional: no fever chills diaphoresis or fatigue or change in weight.  Head and neck: no hearing loss, no epistaxis, no photophobia or visual disturbance. Respiratory: No cough, shortness of breath or wheezing. Cardiovascular: No chest pain peripheral edema, palpitations. Gastrointestinal: No abdominal distention, no abdominal pain, no change in bowel habits hematochezia or melena. Genitourinary: No dysuria, no frequency, no urgency, no nocturia. Musculoskeletal:No arthralgias, no back pain, no gait disturbance or myalgias. Neurological: No dizziness, no headaches, no numbness, no seizures, no syncope, no weakness, no  tremors. Hematologic: No lymphadenopathy, no easy bruising. Psychiatric: No confusion, no hallucinations, no sleep disturbance.    Physical Exam: Filed Vitals:   07/24/12 0958  BP: 122/76  Pulse: 72   the general appearance reveals a well-developed well-nourished woman in no distress.The head and neck exam reveals pupils equal and reactive.  Extraocular movements are full.  There is no scleral icterus.  The mouth and pharynx are normal.  The neck is supple.  The carotids reveal no bruits.  The jugular venous pressure is normal.  The  thyroid is not enlarged.  There is no lymphadenopathy.  The  chest is clear to percussion and auscultation.  There are no rales or rhonchi.  Expansion of the chest is symmetrical.  The precordium is quiet.  The first heart sound is normal.  The second heart sound is physiologically split.  There is no murmur gallop rub or click.  There is no abnormal lift or heave.  The abdomen is soft and nontender.  The bowel sounds are normal.  The liver and spleen are not enlarged.  There are no abdominal masses.  There are no abdominal bruits.  Extremities reveal good pedal pulses.  There is no phlebitis or edema.  There is no cyanosis or clubbing.  Strength is normal and symmetrical in all extremities.  There is no lateralizing weakness.  There are no sensory deficits.  The skin is warm and dry.  There is no rash.  EKG today shows normal sinus rhythm and is within normal limits.  Assessment / Plan: Patient is to continue same medication and be rechecked in 6 months.  Continue full activity and regular exercise.

## 2012-09-10 ENCOUNTER — Ambulatory Visit: Payer: Medicare Other | Admitting: Cardiology

## 2013-02-20 ENCOUNTER — Ambulatory Visit: Payer: Medicare Other | Admitting: Cardiology

## 2013-04-21 ENCOUNTER — Ambulatory Visit (INDEPENDENT_AMBULATORY_CARE_PROVIDER_SITE_OTHER): Payer: Medicare Other | Admitting: Cardiology

## 2013-04-21 ENCOUNTER — Encounter: Payer: Self-pay | Admitting: Cardiology

## 2013-04-21 VITALS — BP 137/53 | HR 73 | Ht 64.0 in | Wt 149.0 lb

## 2013-04-21 DIAGNOSIS — R0989 Other specified symptoms and signs involving the circulatory and respiratory systems: Secondary | ICD-10-CM

## 2013-04-21 DIAGNOSIS — I059 Rheumatic mitral valve disease, unspecified: Secondary | ICD-10-CM

## 2013-04-21 DIAGNOSIS — K589 Irritable bowel syndrome without diarrhea: Secondary | ICD-10-CM

## 2013-04-21 DIAGNOSIS — K52831 Collagenous colitis: Secondary | ICD-10-CM

## 2013-04-21 DIAGNOSIS — I10 Essential (primary) hypertension: Secondary | ICD-10-CM

## 2013-04-21 DIAGNOSIS — E7800 Pure hypercholesterolemia, unspecified: Secondary | ICD-10-CM

## 2013-04-21 DIAGNOSIS — K5289 Other specified noninfective gastroenteritis and colitis: Secondary | ICD-10-CM

## 2013-04-21 DIAGNOSIS — I119 Hypertensive heart disease without heart failure: Secondary | ICD-10-CM

## 2013-04-21 DIAGNOSIS — I340 Nonrheumatic mitral (valve) insufficiency: Secondary | ICD-10-CM

## 2013-04-21 NOTE — Progress Notes (Signed)
Beth Wise Date of Birth:  [birth year] [address] [place], [place]  [address] [phone]         Fax   [phone]  History of Present Illness: This pleasant 80 year old woman is seen for a six-month followup office visit. She has a past history of labile hypertension. She has a history of hypercholesterolemia and a history of atypical chest pain. She had a normal adenosine Cardiolite stress test in December 2009. She had an echocardiogram 11/01/07 showing mild LVH with an ejection fraction of 55-60% and grade 1 diastolic dysfunction. There was moderate aortic insufficiency and mild mitral regurgitation. She has a history of colitis and is followed by Dr. Carlean Purl. Since last visit she has been feeling well. Her GI symptoms have been quiescent. She's not having any chest pain.  Her weight has increased 9 pounds since last visit because her colitis is now under better control.  Current Outpatient Prescriptions  Medication Sig Dispense Refill  . Biotin 1000 MCG tablet Take 1,000 mcg by mouth daily. Pt takes to help with nails      . Fexofenadine HCl (ALLEGRA PO) Take by mouth daily.      . flurazepam (DALMANE) 15 MG capsule Take 15 mg by mouth at bedtime as needed.      . fluticasone (FLONASE) 50 MCG/ACT nasal spray Place 1 spray into the nose as needed. Sinus drainage      . GuaiFENesin (MUCINEX PO) Take by mouth as needed.      Marland Kitchen ipratropium (ATROVENT) 0.03 % nasal spray As directed nasal spray      . Multiple Vitamins-Minerals (MULTIVITAMIN WITH MINERALS) tablet Take 4 tablets by mouth. 3 times a week      . naproxen sodium (ANAPROX) 220 MG tablet Take 220 mg by mouth as needed. pain       . Simethicone (GAS-X PO) Take by mouth as needed.        Marland Kitchen omeprazole (PRILOSEC) 20 MG capsule As needed -pt doesn't take it everyday       No current facility-administered medications for this visit.    Allergies  Allergen Reactions  . Erythromycin Other (See Comments)    Gi  upset  . Lipitor [Atorvastatin Calcium]   . Zetia [Ezetimibe]     Patient Active Problem List   Diagnosis Date Noted  . Mitral regurgitation 02/22/2011  . Anemia of chronic disease 10/18/2010  . IBS (irritable bowel syndrome) 10/12/2010  . Labile hypertension 08/16/2010  . Hypercholesterolemia 08/16/2010  . Chest pain 08/16/2010  . Odynophagia 08/04/2010  . GERD 04/03/2007  . Collagenous colitis 04/03/2007    History  Smoking status  . Never Smoker   Smokeless tobacco  . Never Used    History  Alcohol Use  . Yes    Comment: rarely    Family History  Problem Relation Age of Onset  . Breast cancer Mother   . Stroke Mother   . Colon cancer Neg Hx   . Colon polyps Brother   . Heart disease Sister     and brother    Review of Systems: Constitutional: no fever chills diaphoresis or fatigue or change in weight.  Head and neck: no hearing loss, no epistaxis, no photophobia or visual disturbance. Respiratory: No cough, shortness of breath or wheezing. Cardiovascular: No chest pain peripheral edema, palpitations. Gastrointestinal: No abdominal distention, no abdominal pain, no change in bowel habits hematochezia or melena. Genitourinary: No dysuria, no frequency, no urgency, no nocturia. Musculoskeletal:No  arthralgias, no back pain, no gait disturbance or myalgias. Neurological: No dizziness, no headaches, no numbness, no seizures, no syncope, no weakness, no tremors. Hematologic: No lymphadenopathy, no easy bruising. Psychiatric: No confusion, no hallucinations, no sleep disturbance.    Physical Exam: Filed Vitals:   04/21/13 1412  BP: 137/53  Pulse: 73   the general appearance reveals a well-developed well-nourished woman in no distress.The head and neck exam reveals pupils equal and reactive.  Extraocular movements are full.  There is no scleral icterus.  The mouth and pharynx are normal.  The neck is supple.  The carotids reveal no bruits.  The jugular venous  pressure is normal.  The  thyroid is not enlarged.  There is no lymphadenopathy.  The chest is clear to percussion and auscultation.  There are no rales or rhonchi.  Expansion of the chest is symmetrical.  The precordium is quiet.  The first heart sound is normal.  The second heart sound is physiologically split.  There is no  gallop rub or click.  There is a grade 2/6 systolic ejection murmur at the base.  There is no abnormal lift or heave.  The abdomen is soft and nontender.  The bowel sounds are normal.  The liver and spleen are not enlarged.  There are no abdominal masses.  There are no abdominal bruits.  Extremities reveal good pedal pulses.  There is no phlebitis or edema.  There is no cyanosis or clubbing.  Strength is normal and symmetrical in all extremities.  There is no lateralizing weakness.  There are no sensory deficits.  The skin is warm and dry.  There is no rash.    Assessment / Plan: Patient is to continue same medication and be rechecked in 6 months for office visit and EKG.  Continue full activity and regular exercise.

## 2013-04-21 NOTE — Patient Instructions (Signed)
Your physician recommends that you continue on your current medications as directed. Please refer to the Current Medication list given to you today.  Your physician wants you to follow-up in: 6 month ov/ekg You will receive a reminder letter in the mail two months in advance. If you don't receive a letter, please call our office to schedule the follow-up appointment.  

## 2013-04-21 NOTE — Assessment & Plan Note (Signed)
Patient is not symptoms from her mild mitral regurgitation.  No orthopnea or paroxysmal nocturnal dyspnea.  Exercise tolerance is good.

## 2013-04-21 NOTE — Assessment & Plan Note (Signed)
The patient reports that her GI system is working better.  She is having just one loose stool a day now.  She does not have to take any Imodium on a regular basis.

## 2013-04-21 NOTE — Assessment & Plan Note (Signed)
The patient has not been having any palpitations or exertional chest pain.  No dizziness or syncope.

## 2013-09-15 ENCOUNTER — Other Ambulatory Visit: Payer: Self-pay | Admitting: Internal Medicine

## 2013-09-15 DIAGNOSIS — R1013 Epigastric pain: Secondary | ICD-10-CM

## 2013-09-16 ENCOUNTER — Other Ambulatory Visit: Payer: Self-pay | Admitting: Internal Medicine

## 2013-09-16 ENCOUNTER — Ambulatory Visit
Admission: RE | Admit: 2013-09-16 | Discharge: 2013-09-16 | Disposition: A | Discharge status: Home / Self Care | Payer: Medicare Other | Source: Ambulatory Visit | Attending: Internal Medicine | Admitting: Internal Medicine

## 2013-09-16 DIAGNOSIS — R1013 Epigastric pain: Secondary | ICD-10-CM

## 2013-10-14 ENCOUNTER — Encounter (HOSPITAL_COMMUNITY)
Admission: RE | Admit: 2013-10-14 | Discharge: 2013-10-14 | Disposition: A | Discharge status: Home / Self Care | Payer: Medicare Other | Source: Ambulatory Visit | Attending: Internal Medicine | Admitting: Internal Medicine

## 2013-10-14 ENCOUNTER — Other Ambulatory Visit (HOSPITAL_COMMUNITY): Payer: Self-pay | Admitting: Internal Medicine

## 2013-10-14 ENCOUNTER — Encounter (HOSPITAL_COMMUNITY): Payer: Self-pay

## 2013-10-14 DIAGNOSIS — D649 Anemia, unspecified: Secondary | ICD-10-CM | POA: Insufficient documentation

## 2013-10-14 MED ORDER — SODIUM CHLORIDE 0.9 % IV SOLN
Freq: Once | INTRAVENOUS | Status: AC
  Administered 2013-10-14: 15:00:00 via INTRAVENOUS

## 2013-10-14 MED ORDER — FERUMOXYTOL INJECTION 510 MG/17 ML
510.0000 mg | Freq: Once | INTRAVENOUS | Status: AC
  Administered 2013-10-14: 510 mg via INTRAVENOUS
  Filled 2013-10-14: qty 17

## 2013-10-14 NOTE — Progress Notes (Signed)
[facility] arrived to pick pt up at short stay exit.  Took pt to car via wheelchair.  VSS, afebrile.  Pt tolerated feraheme infusion well today.

## 2013-10-14 NOTE — Progress Notes (Signed)
510mg  IV Feraheme complete.  Will observe pt for 61minutes.  Pt resides at [facility], called them at this time to inform them pt would be ready at 1540.  Informed them to come to the short stay exit and call [phone] to inform us they are here.

## 2013-10-14 NOTE — Discharge Instructions (Signed)

## 2013-10-20 ENCOUNTER — Encounter (HOSPITAL_COMMUNITY)
Admission: RE | Admit: 2013-10-20 | Discharge: 2013-10-20 | Disposition: A | Discharge status: Home / Self Care | Payer: Medicare Other | Source: Ambulatory Visit | Attending: Internal Medicine | Admitting: Internal Medicine

## 2013-10-20 ENCOUNTER — Encounter (HOSPITAL_COMMUNITY): Payer: Self-pay

## 2013-10-20 DIAGNOSIS — D649 Anemia, unspecified: Secondary | ICD-10-CM | POA: Diagnosis not present

## 2013-10-20 MED ORDER — SODIUM CHLORIDE 0.9 % IV SOLN
510.0000 mg | Freq: Once | INTRAVENOUS | Status: AC
  Administered 2013-10-20: 510 mg via INTRAVENOUS
  Filled 2013-10-20: qty 17

## 2013-10-20 MED ORDER — SODIUM CHLORIDE 0.9 % IV SOLN
Freq: Once | INTRAVENOUS | Status: AC
  Administered 2013-10-20: 14:00:00 via INTRAVENOUS

## 2013-10-20 NOTE — Discharge Instructions (Signed)

## 2013-10-29 ENCOUNTER — Ambulatory Visit: Payer: Medicare Other | Admitting: Cardiology

## 2013-12-15 ENCOUNTER — Encounter: Payer: Self-pay | Admitting: Internal Medicine

## 2014-05-20 ENCOUNTER — Ambulatory Visit (INDEPENDENT_AMBULATORY_CARE_PROVIDER_SITE_OTHER): Payer: PPO | Admitting: Cardiology

## 2014-05-20 ENCOUNTER — Encounter: Payer: Self-pay | Admitting: Cardiology

## 2014-05-20 VITALS — BP 116/64 | HR 67 | Ht 63.0 in | Wt 147.1 lb

## 2014-05-20 DIAGNOSIS — I10 Essential (primary) hypertension: Secondary | ICD-10-CM

## 2014-05-20 NOTE — Progress Notes (Signed)
Cardiology Office Note   Date:  05/20/2014   ID:  Beth Wise, DOB 06/04/1925, MRN [id number]  PCP:  Donnajean Lopes, MD  Cardiologist: Darlin Coco MD  No chief complaint on file.     History of Present Illness: Beth Wise is a 81 y.o. female who presents for a one-year follow-up office visit   She has a past history of labile hypertension. She has a history of hypercholesterolemia and a history of atypical chest pain. She had a normal adenosine Cardiolite stress test in December 2009. She had an echocardiogram 11/01/07 showing mild LVH with an ejection fraction of 55-60% and grade 1 diastolic dysfunction. There was moderate aortic insufficiency and mild mitral regurgitation.  Since last visit she has been less physically active.  She occasionally goes to the swimming pool at [facility] and does exercises.  She has not been having any chest pain or shortness of breath.  She has not been having any dizziness or syncope.  She has not been experiencing any racing of her heart.  Past Medical History  Diagnosis Date  . Allergy   . Arthritis   . GERD (gastroesophageal reflux disease)   . Heart murmur   . Hypertension   . Chest pain, atypical   . Hyperlipidemia   . Back pain   . History of fibromyalgia   . Diverticulosis   . Hiatal hernia 2010  . Candidal esophagitis 2010  . Collagenous colitis     Past Surgical History  Procedure Laterality Date  . Cataract extraction w/ intraocular lens  implant, bilateral    . Upper gastrointestinal endoscopy  01/29/2008    esophagitis, hiatal hernia, gastritis  . Colonoscopy  04/24/2006    diverticulosis  . Abdominal hysterectomy    . Breast biopsy      left benign  . Tonsillectomy and adenoidectomy    . US echocardiography  10/30/2007     EF 55-60%  . Cardiovascular stress test  12/03/2007    EF 72%  . Flexible sigmoidoscopy  07/27/2010    diverticulosis, collagenous colitis     Current Outpatient Prescriptions    Medication Sig Dispense Refill  . Fexofenadine HCl (ALLEGRA PO) Take 1 tablet by mouth daily.     . flurazepam (DALMANE) 15 MG capsule Take 15 mg by mouth at bedtime as needed (sleep).     . fluticasone (FLONASE) 50 MCG/ACT nasal spray Place 1 spray into the nose as needed. Sinus drainage    . GuaiFENesin (MUCINEX PO) Take 1 tablet by mouth as needed.     Marland Kitchen ipratropium (ATROVENT) 0.03 % nasal spray As directed nasal spray    . Multiple Vitamins-Minerals (MULTIVITAMIN WITH MINERALS) tablet Take 1 tablet by mouth daily. 3 times a week    . omeprazole (PRILOSEC) 20 MG capsule Take 20 mg by mouth daily as needed (for acid reflux). As needed -pt doesn't take it everyday    . Simethicone (GAS-X PO) Take 1 tablet by mouth as needed (for gas).     . traMADol (ULTRAM) 50 MG tablet Take by mouth 3 (three) times daily as needed (for pain).     No current facility-administered medications for this visit.    Allergies:   Actonel; Erythromycin; Lipitor; and Zetia    Social History:  The patient  reports that she has never smoked. She has never used smokeless tobacco. She reports that she drinks alcohol. She reports that she does not use illicit drugs.   Family History:  The patient's family history includes Breast cancer in her mother; Colon polyps in her brother; Heart disease in her sister; Stroke in her mother. There is no history of Colon cancer.    ROS:  Please see the history of present illness.   Otherwise, review of systems are positive for none.   All other systems are reviewed and negative.    PHYSICAL EXAM: VS:  BP 116/64 mmHg  Pulse 67  Ht 5\' 3"  (1.6 m)  Wt 147 lb 1.9 oz (66.733 kg)  BMI 26.07 kg/m2 , BMI Body mass index is 26.07 kg/(m^2). GEN: Well nourished, well developed, in no acute distress HEENT: normal Neck: no JVD, carotid bruits, or masses Cardiac: RRR; no murmurs, rubs, or gallops,no edema  Respiratory:  clear to auscultation bilaterally, normal work of breathing GI:  soft, nontender, nondistended, + BS MS: no deformity or atrophy Skin: warm and dry, no rash Neuro:  Strength and sensation are intact Psych: euthymic mood, full affect   EKG:  EKG is ordered today. The ekg ordered today demonstrates normal sinus rhythm with marked sinus arrhythmia.  No ischemic changes.   Recent Labs: No results found for requested labs within last 365 days.    Lipid Panel    Component Value Date/Time   CHOL 185 08/16/2010 0933   TRIG 76.0 08/16/2010 0933   HDL 91.90 08/16/2010 0933   CHOLHDL 2 08/16/2010 0933   VLDL 15.2 08/16/2010 0933   LDLCALC 78 08/16/2010 0933      Wt Readings from Last 3 Encounters:  05/20/14 147 lb 1.9 oz (66.733 kg)  04/21/13 149 lb (67.586 kg)  07/24/12 140 lb (63.504 kg)       ASSESSMENT AND PLAN:  1.  Essential hypertension 2.  GERD followed by Dr. Carlean Purl 3.  History of hypercholesterolemia followed by Dr. Leanna Battles  Current medicines are reviewed at length with the patient today.  The patient does not have concerns regarding medicines.  The following changes have been made:  no change  Labs/ tests ordered today include:  No orders of the defined types were placed in this encounter.    Disposition: The patient is doing well.  I encouraged her to get more regular aerobic exercise.  Recheck in one year for follow-up office visit and EKG.  No new medications prescribed.  Berna Spare MD 05/20/2014 4:58 PM    [address] [facility] [place], [place], [place]  [address] Phone: [phone]; Fax: [phone]

## 2014-05-20 NOTE — Patient Instructions (Signed)
Medication Instructions:  Your physician recommends that you continue on your current medications as directed. Please refer to the Current Medication list given to you today.  Labwork: NONE  Testing/Procedures: NONE  Follow-Up: Your physician wants you to follow-up in: 1 YEAR OV/EKG  You will receive a reminder letter in the mail two months in advance. If you don't receive a letter, please call our office to schedule the follow-up appointment.    

## 2014-08-10 ENCOUNTER — Encounter: Payer: Self-pay | Admitting: Internal Medicine

## 2014-08-10 ENCOUNTER — Encounter: Payer: Self-pay | Admitting: Gastroenterology

## 2014-12-06 ENCOUNTER — Ambulatory Visit (HOSPITAL_COMMUNITY): Payer: PPO

## 2015-01-07 ENCOUNTER — Ambulatory Visit: Payer: Self-pay | Admitting: Cardiology

## 2015-01-13 DIAGNOSIS — M545 Low back pain: Secondary | ICD-10-CM | POA: Diagnosis not present

## 2015-01-13 DIAGNOSIS — M542 Cervicalgia: Secondary | ICD-10-CM | POA: Diagnosis not present

## 2015-02-11 ENCOUNTER — Ambulatory Visit (INDEPENDENT_AMBULATORY_CARE_PROVIDER_SITE_OTHER): Payer: PPO | Admitting: Cardiology

## 2015-02-11 ENCOUNTER — Encounter: Payer: Self-pay | Admitting: Cardiology

## 2015-02-11 VITALS — BP 148/90 | HR 90 | Ht 63.0 in | Wt 144.0 lb

## 2015-02-11 DIAGNOSIS — I10 Essential (primary) hypertension: Secondary | ICD-10-CM

## 2015-02-11 DIAGNOSIS — I340 Nonrheumatic mitral (valve) insufficiency: Secondary | ICD-10-CM

## 2015-02-11 DIAGNOSIS — E7800 Pure hypercholesterolemia, unspecified: Secondary | ICD-10-CM

## 2015-02-11 NOTE — Patient Instructions (Addendum)
Medication Instructions:  Your physician recommends that you continue on your current medications as directed. Please refer to the Current Medication list given to you today.  Labwork: none  Testing/Procedures: none  Follow-Up: Your physician wants you to follow-up in: 1 year ov with Dr Trellis Moment will receive a reminder letter in the mail two months in advance. If you don't receive a letter, please call our office to schedule the follow-up appointment.  If you need a refill on your cardiac medications before your next appointment, please call your pharmacy.

## 2015-02-11 NOTE — Progress Notes (Signed)
Cardiology Office Note   Date:  02/11/2015   ID:  Beth Wise, DOB [birth year], MRN [id number]  PCP:  Donnajean Lopes, MD  Cardiologist: Darlin Coco MD  No chief complaint on file.     History of Present Illness: Beth Wise is a 82 y.o. female who presents for One-year follow-up visit  She has a past history of labile hypertension. She has a history of hypercholesterolemia and a history of atypical chest pain. She had a normal adenosine Cardiolite stress test in December 2009. She had an echocardiogram 11/01/07 showing mild LVH with an ejection fraction of 55-60% and grade 1 diastolic dysfunction. There was moderate aortic insufficiency and mild mitral regurgitation. Since we last saw her she has not been having any new cardiac symptoms.  She has had occasional peripheral edema of her legs.  She is on tramadol which may be contributing to salt retention.  She denies any palpitations or racing of her heart.  No recent chest pain or shortness of breath.  She is wearing a fit bit and is keeping track of her number of steps that she walks each day. She has a past history of colitis.  These symptoms have been improved over the past 3 years.  She does have some occasional symptoms of GERD and in the past she has seen Dr. Autumn Patty for this.  She eats her main meal at lunch and a small meal at supper. she has had a macular rash which she was told in the past might be related to her colitis..  Past Medical History  Diagnosis Date  . Allergy   . Arthritis   . GERD (gastroesophageal reflux disease)   . Heart murmur   . Hypertension   . Chest pain, atypical   . Hyperlipidemia   . Back pain   . History of fibromyalgia   . Diverticulosis   . Hiatal hernia 2010  . Candidal esophagitis ([place]) 2010  . Collagenous colitis     Past Surgical History  Procedure Laterality Date  . Cataract extraction w/ intraocular lens  implant, bilateral    . Upper gastrointestinal endoscopy   01/29/2008    esophagitis, hiatal hernia, gastritis  . Colonoscopy  04/24/2006    diverticulosis  . Abdominal hysterectomy    . Breast biopsy      left benign  . Tonsillectomy and adenoidectomy    . US echocardiography  10/30/2007     EF 55-60%  . Cardiovascular stress test  12/03/2007    EF 72%  . Flexible sigmoidoscopy  07/27/2010    diverticulosis, collagenous colitis     Current Outpatient Prescriptions  Medication Sig Dispense Refill  . acetaminophen (TYLENOL) 500 MG tablet Take 500 mg by mouth every 6 (six) hours as needed for mild pain or headache.    . flurazepam (DALMANE) 15 MG capsule Take 15 mg by mouth at bedtime as needed (sleep).     . fluticasone (FLONASE) 50 MCG/ACT nasal spray Place 1 spray into the nose as needed. Sinus drainage    . GuaiFENesin (MUCINEX PO) Take 1 tablet by mouth every 12 (twelve) hours as needed (congestion).     Marland Kitchen ipratropium (ATROVENT) 0.03 % nasal spray As directed nasal spray    . Multiple Vitamins-Minerals (MULTIVITAMIN WITH MINERALS) tablet Take 1 tablet by mouth daily. 3 times a week    . omeprazole (PRILOSEC) 20 MG capsule Take 20 mg by mouth daily as needed (for acid reflux). As needed -pt doesn't take  it everyday    . Simethicone (GAS-X PO) Take 1 tablet by mouth as needed (for gas).     . traMADol (ULTRAM) 50 MG tablet Take by mouth 3 (three) times daily as needed (for pain).     No current facility-administered medications for this visit.    Allergies:   Actonel; Erythromycin; Lipitor; and Zetia    Social History:  The patient  reports that she has never smoked. She has never used smokeless tobacco. She reports that she drinks alcohol. She reports that she does not use illicit drugs.   Family History:  The patient's family history includes Breast cancer in her mother; Colon polyps in her brother; Heart disease in her sister; Stroke in her mother. There is no history of Colon cancer.    ROS:  Please see the history of present  illness.   Otherwise, review of systems are positive for none.   All other systems are reviewed and negative.    PHYSICAL EXAM: VS:  BP 148/90 mmHg  Pulse 90  Ht 5\' 3"  (1.6 m)  Wt 144 lb (65.318 kg)  BMI 25.51 kg/m2  SpO2 97% , BMI Body mass index is 25.51 kg/(m^2). GEN: Well nourished, well developed, in no acute distress HEENT: normal Neck: no JVD, carotid bruits, or masses Cardiac: RRR; Grade 1/6 apical systolic murmur.  No diastolic murmur.  No click.  No, rubs, or gallops,And there is trace edema  Respiratory:  clear to auscultation bilaterally, normal work of breathing GI: soft, nontender, nondistended, + BS MS: no deformity or atrophy Skin: warm and dry, no rash Neuro:  Strength and sensation are intact Psych: euthymic mood, full affect   EKG:  EKG is not ordered today.    Recent Labs: No results found for requested labs within last 365 days.    Lipid Panel    Component Value Date/Time   CHOL 185 08/16/2010 0933   TRIG 76.0 08/16/2010 0933   HDL 91.90 08/16/2010 0933   CHOLHDL 2 08/16/2010 0933   VLDL 15.2 08/16/2010 0933   LDLCALC 78 08/16/2010 0933      Wt Readings from Last 3 Encounters:  02/11/15 144 lb (65.318 kg)  05/20/14 147 lb 1.9 oz (66.733 kg)  04/21/13 149 lb (67.586 kg)         ASSESSMENT AND PLAN:  1. Essential hypertension.I repeated her blood pressure myself at the end of the exam and it was still mildly elevated at 142/88 right arm.  At home her blood pressure has not been elevated.  She will continue to monitor it. 2. GERD followed by Dr. Carlean Purl 3. History of hypercholesterolemia followed by Dr. Leanna Battles 4. Macular skin rash.  She will contact her dermatologist.   Current medicines are reviewed at length with the patient today.  The patient does not have concerns regarding medicines.  The following changes have been made:  no change  Labs/ tests ordered today include:  No orders of the defined types were placed in  this encounter.     Disposition:  Continue current medication.  Follow-up in one year with Dr. Stanford Breed.  She will continue to be followed closely by Dr. Leanna Battles and she gets her lab work done there.  Berna Spare MD 02/11/2015 1:06 PM    [facility] [address], [place], [place]  [address] Phone: [phone]; Fax: [phone]

## 2015-04-20 DIAGNOSIS — Z961 Presence of intraocular lens: Secondary | ICD-10-CM | POA: Diagnosis not present

## 2015-04-20 DIAGNOSIS — H01003 Unspecified blepharitis right eye, unspecified eyelid: Secondary | ICD-10-CM | POA: Diagnosis not present

## 2015-04-20 DIAGNOSIS — H04123 Dry eye syndrome of bilateral lacrimal glands: Secondary | ICD-10-CM | POA: Diagnosis not present

## 2015-04-20 DIAGNOSIS — H40013 Open angle with borderline findings, low risk, bilateral: Secondary | ICD-10-CM | POA: Diagnosis not present

## 2015-04-28 DIAGNOSIS — Q72812 Congenital shortening of left lower limb: Secondary | ICD-10-CM | POA: Diagnosis not present

## 2015-04-28 DIAGNOSIS — M50322 Other cervical disc degeneration at C5-C6 level: Secondary | ICD-10-CM | POA: Diagnosis not present

## 2015-04-28 DIAGNOSIS — M25552 Pain in left hip: Secondary | ICD-10-CM | POA: Diagnosis not present

## 2015-04-28 DIAGNOSIS — M25551 Pain in right hip: Secondary | ICD-10-CM | POA: Diagnosis not present

## 2015-04-28 DIAGNOSIS — M4003 Postural kyphosis, cervicothoracic region: Secondary | ICD-10-CM | POA: Diagnosis not present

## 2015-04-28 DIAGNOSIS — M50323 Other cervical disc degeneration at C6-C7 level: Secondary | ICD-10-CM | POA: Diagnosis not present

## 2015-04-28 DIAGNOSIS — M9904 Segmental and somatic dysfunction of sacral region: Secondary | ICD-10-CM | POA: Diagnosis not present

## 2015-04-28 DIAGNOSIS — M9901 Segmental and somatic dysfunction of cervical region: Secondary | ICD-10-CM | POA: Diagnosis not present

## 2015-04-28 DIAGNOSIS — M9902 Segmental and somatic dysfunction of thoracic region: Secondary | ICD-10-CM | POA: Diagnosis not present

## 2015-04-28 DIAGNOSIS — M5136 Other intervertebral disc degeneration, lumbar region: Secondary | ICD-10-CM | POA: Diagnosis not present

## 2015-04-28 DIAGNOSIS — M9903 Segmental and somatic dysfunction of lumbar region: Secondary | ICD-10-CM | POA: Diagnosis not present

## 2015-04-28 DIAGNOSIS — M9905 Segmental and somatic dysfunction of pelvic region: Secondary | ICD-10-CM | POA: Diagnosis not present

## 2015-05-02 DIAGNOSIS — M545 Low back pain: Secondary | ICD-10-CM | POA: Diagnosis not present

## 2015-05-02 DIAGNOSIS — M810 Age-related osteoporosis without current pathological fracture: Secondary | ICD-10-CM | POA: Diagnosis not present

## 2015-05-02 DIAGNOSIS — K219 Gastro-esophageal reflux disease without esophagitis: Secondary | ICD-10-CM | POA: Diagnosis not present

## 2015-05-02 DIAGNOSIS — F5104 Psychophysiologic insomnia: Secondary | ICD-10-CM | POA: Diagnosis not present

## 2015-05-02 DIAGNOSIS — Z6825 Body mass index (BMI) 25.0-25.9, adult: Secondary | ICD-10-CM | POA: Diagnosis not present

## 2015-05-02 DIAGNOSIS — N8110 Cystocele, unspecified: Secondary | ICD-10-CM | POA: Diagnosis not present

## 2015-05-05 DIAGNOSIS — M25552 Pain in left hip: Secondary | ICD-10-CM | POA: Diagnosis not present

## 2015-05-05 DIAGNOSIS — M9901 Segmental and somatic dysfunction of cervical region: Secondary | ICD-10-CM | POA: Diagnosis not present

## 2015-05-05 DIAGNOSIS — M5136 Other intervertebral disc degeneration, lumbar region: Secondary | ICD-10-CM | POA: Diagnosis not present

## 2015-05-05 DIAGNOSIS — M50323 Other cervical disc degeneration at C6-C7 level: Secondary | ICD-10-CM | POA: Diagnosis not present

## 2015-05-05 DIAGNOSIS — M25551 Pain in right hip: Secondary | ICD-10-CM | POA: Diagnosis not present

## 2015-05-05 DIAGNOSIS — M50322 Other cervical disc degeneration at C5-C6 level: Secondary | ICD-10-CM | POA: Diagnosis not present

## 2015-05-05 DIAGNOSIS — M9904 Segmental and somatic dysfunction of sacral region: Secondary | ICD-10-CM | POA: Diagnosis not present

## 2015-05-05 DIAGNOSIS — M9905 Segmental and somatic dysfunction of pelvic region: Secondary | ICD-10-CM | POA: Diagnosis not present

## 2015-05-05 DIAGNOSIS — Q72812 Congenital shortening of left lower limb: Secondary | ICD-10-CM | POA: Diagnosis not present

## 2015-05-05 DIAGNOSIS — M9903 Segmental and somatic dysfunction of lumbar region: Secondary | ICD-10-CM | POA: Diagnosis not present

## 2015-05-05 DIAGNOSIS — M9902 Segmental and somatic dysfunction of thoracic region: Secondary | ICD-10-CM | POA: Diagnosis not present

## 2015-05-05 DIAGNOSIS — M4003 Postural kyphosis, cervicothoracic region: Secondary | ICD-10-CM | POA: Diagnosis not present

## 2015-05-06 ENCOUNTER — Other Ambulatory Visit (HOSPITAL_COMMUNITY): Payer: Self-pay | Admitting: *Deleted

## 2015-05-09 ENCOUNTER — Encounter (HOSPITAL_COMMUNITY)
Admission: RE | Admit: 2015-05-09 | Discharge: 2015-05-09 | Disposition: A | Discharge status: Home / Self Care | Payer: PPO | Source: Ambulatory Visit | Attending: Internal Medicine | Admitting: Internal Medicine

## 2015-05-09 DIAGNOSIS — M50322 Other cervical disc degeneration at C5-C6 level: Secondary | ICD-10-CM | POA: Diagnosis not present

## 2015-05-09 DIAGNOSIS — M9904 Segmental and somatic dysfunction of sacral region: Secondary | ICD-10-CM | POA: Diagnosis not present

## 2015-05-09 DIAGNOSIS — Q72812 Congenital shortening of left lower limb: Secondary | ICD-10-CM | POA: Diagnosis not present

## 2015-05-09 DIAGNOSIS — M5136 Other intervertebral disc degeneration, lumbar region: Secondary | ICD-10-CM | POA: Diagnosis not present

## 2015-05-09 DIAGNOSIS — M9903 Segmental and somatic dysfunction of lumbar region: Secondary | ICD-10-CM | POA: Diagnosis not present

## 2015-05-09 DIAGNOSIS — M4003 Postural kyphosis, cervicothoracic region: Secondary | ICD-10-CM | POA: Diagnosis not present

## 2015-05-09 DIAGNOSIS — M810 Age-related osteoporosis without current pathological fracture: Secondary | ICD-10-CM | POA: Diagnosis not present

## 2015-05-09 DIAGNOSIS — M9905 Segmental and somatic dysfunction of pelvic region: Secondary | ICD-10-CM | POA: Diagnosis not present

## 2015-05-09 DIAGNOSIS — M9901 Segmental and somatic dysfunction of cervical region: Secondary | ICD-10-CM | POA: Diagnosis not present

## 2015-05-09 DIAGNOSIS — M25552 Pain in left hip: Secondary | ICD-10-CM | POA: Diagnosis not present

## 2015-05-09 DIAGNOSIS — M50323 Other cervical disc degeneration at C6-C7 level: Secondary | ICD-10-CM | POA: Diagnosis not present

## 2015-05-09 DIAGNOSIS — M25551 Pain in right hip: Secondary | ICD-10-CM | POA: Diagnosis not present

## 2015-05-09 DIAGNOSIS — M9902 Segmental and somatic dysfunction of thoracic region: Secondary | ICD-10-CM | POA: Diagnosis not present

## 2015-05-09 MED ORDER — DENOSUMAB 60 MG/ML ~~LOC~~ SOLN
60.0000 mg | Freq: Once | SUBCUTANEOUS | Status: AC
  Administered 2015-05-09: 60 mg via SUBCUTANEOUS
  Filled 2015-05-09: qty 1

## 2015-05-11 DIAGNOSIS — M25552 Pain in left hip: Secondary | ICD-10-CM | POA: Diagnosis not present

## 2015-05-11 DIAGNOSIS — M9903 Segmental and somatic dysfunction of lumbar region: Secondary | ICD-10-CM | POA: Diagnosis not present

## 2015-05-11 DIAGNOSIS — M5136 Other intervertebral disc degeneration, lumbar region: Secondary | ICD-10-CM | POA: Diagnosis not present

## 2015-05-11 DIAGNOSIS — M9902 Segmental and somatic dysfunction of thoracic region: Secondary | ICD-10-CM | POA: Diagnosis not present

## 2015-05-11 DIAGNOSIS — M50323 Other cervical disc degeneration at C6-C7 level: Secondary | ICD-10-CM | POA: Diagnosis not present

## 2015-05-11 DIAGNOSIS — M50322 Other cervical disc degeneration at C5-C6 level: Secondary | ICD-10-CM | POA: Diagnosis not present

## 2015-05-11 DIAGNOSIS — M25551 Pain in right hip: Secondary | ICD-10-CM | POA: Diagnosis not present

## 2015-05-11 DIAGNOSIS — Q72812 Congenital shortening of left lower limb: Secondary | ICD-10-CM | POA: Diagnosis not present

## 2015-05-11 DIAGNOSIS — M9905 Segmental and somatic dysfunction of pelvic region: Secondary | ICD-10-CM | POA: Diagnosis not present

## 2015-05-11 DIAGNOSIS — M9904 Segmental and somatic dysfunction of sacral region: Secondary | ICD-10-CM | POA: Diagnosis not present

## 2015-05-11 DIAGNOSIS — M9901 Segmental and somatic dysfunction of cervical region: Secondary | ICD-10-CM | POA: Diagnosis not present

## 2015-05-11 DIAGNOSIS — M4003 Postural kyphosis, cervicothoracic region: Secondary | ICD-10-CM | POA: Diagnosis not present

## 2015-05-12 DIAGNOSIS — M25552 Pain in left hip: Secondary | ICD-10-CM | POA: Diagnosis not present

## 2015-05-12 DIAGNOSIS — M50322 Other cervical disc degeneration at C5-C6 level: Secondary | ICD-10-CM | POA: Diagnosis not present

## 2015-05-12 DIAGNOSIS — M9904 Segmental and somatic dysfunction of sacral region: Secondary | ICD-10-CM | POA: Diagnosis not present

## 2015-05-12 DIAGNOSIS — M5136 Other intervertebral disc degeneration, lumbar region: Secondary | ICD-10-CM | POA: Diagnosis not present

## 2015-05-12 DIAGNOSIS — M9905 Segmental and somatic dysfunction of pelvic region: Secondary | ICD-10-CM | POA: Diagnosis not present

## 2015-05-12 DIAGNOSIS — M4003 Postural kyphosis, cervicothoracic region: Secondary | ICD-10-CM | POA: Diagnosis not present

## 2015-05-12 DIAGNOSIS — M25551 Pain in right hip: Secondary | ICD-10-CM | POA: Diagnosis not present

## 2015-05-12 DIAGNOSIS — M9903 Segmental and somatic dysfunction of lumbar region: Secondary | ICD-10-CM | POA: Diagnosis not present

## 2015-05-12 DIAGNOSIS — Q72812 Congenital shortening of left lower limb: Secondary | ICD-10-CM | POA: Diagnosis not present

## 2015-05-12 DIAGNOSIS — M9901 Segmental and somatic dysfunction of cervical region: Secondary | ICD-10-CM | POA: Diagnosis not present

## 2015-05-12 DIAGNOSIS — M9902 Segmental and somatic dysfunction of thoracic region: Secondary | ICD-10-CM | POA: Diagnosis not present

## 2015-05-12 DIAGNOSIS — M50323 Other cervical disc degeneration at C6-C7 level: Secondary | ICD-10-CM | POA: Diagnosis not present

## 2015-05-17 DIAGNOSIS — M50323 Other cervical disc degeneration at C6-C7 level: Secondary | ICD-10-CM | POA: Diagnosis not present

## 2015-05-17 DIAGNOSIS — M9902 Segmental and somatic dysfunction of thoracic region: Secondary | ICD-10-CM | POA: Diagnosis not present

## 2015-05-17 DIAGNOSIS — M4003 Postural kyphosis, cervicothoracic region: Secondary | ICD-10-CM | POA: Diagnosis not present

## 2015-05-17 DIAGNOSIS — Q72812 Congenital shortening of left lower limb: Secondary | ICD-10-CM | POA: Diagnosis not present

## 2015-05-17 DIAGNOSIS — M9904 Segmental and somatic dysfunction of sacral region: Secondary | ICD-10-CM | POA: Diagnosis not present

## 2015-05-17 DIAGNOSIS — M9901 Segmental and somatic dysfunction of cervical region: Secondary | ICD-10-CM | POA: Diagnosis not present

## 2015-05-17 DIAGNOSIS — M9905 Segmental and somatic dysfunction of pelvic region: Secondary | ICD-10-CM | POA: Diagnosis not present

## 2015-05-17 DIAGNOSIS — M9903 Segmental and somatic dysfunction of lumbar region: Secondary | ICD-10-CM | POA: Diagnosis not present

## 2015-05-17 DIAGNOSIS — M5136 Other intervertebral disc degeneration, lumbar region: Secondary | ICD-10-CM | POA: Diagnosis not present

## 2015-05-17 DIAGNOSIS — M25551 Pain in right hip: Secondary | ICD-10-CM | POA: Diagnosis not present

## 2015-05-17 DIAGNOSIS — M50322 Other cervical disc degeneration at C5-C6 level: Secondary | ICD-10-CM | POA: Diagnosis not present

## 2015-05-17 DIAGNOSIS — M25552 Pain in left hip: Secondary | ICD-10-CM | POA: Diagnosis not present

## 2015-05-18 DIAGNOSIS — M9902 Segmental and somatic dysfunction of thoracic region: Secondary | ICD-10-CM | POA: Diagnosis not present

## 2015-05-18 DIAGNOSIS — M50322 Other cervical disc degeneration at C5-C6 level: Secondary | ICD-10-CM | POA: Diagnosis not present

## 2015-05-18 DIAGNOSIS — M9901 Segmental and somatic dysfunction of cervical region: Secondary | ICD-10-CM | POA: Diagnosis not present

## 2015-05-18 DIAGNOSIS — M4003 Postural kyphosis, cervicothoracic region: Secondary | ICD-10-CM | POA: Diagnosis not present

## 2015-05-18 DIAGNOSIS — M50323 Other cervical disc degeneration at C6-C7 level: Secondary | ICD-10-CM | POA: Diagnosis not present

## 2015-05-18 DIAGNOSIS — Q72812 Congenital shortening of left lower limb: Secondary | ICD-10-CM | POA: Diagnosis not present

## 2015-05-18 DIAGNOSIS — M25552 Pain in left hip: Secondary | ICD-10-CM | POA: Diagnosis not present

## 2015-05-18 DIAGNOSIS — M9905 Segmental and somatic dysfunction of pelvic region: Secondary | ICD-10-CM | POA: Diagnosis not present

## 2015-05-18 DIAGNOSIS — M9903 Segmental and somatic dysfunction of lumbar region: Secondary | ICD-10-CM | POA: Diagnosis not present

## 2015-05-18 DIAGNOSIS — M5136 Other intervertebral disc degeneration, lumbar region: Secondary | ICD-10-CM | POA: Diagnosis not present

## 2015-05-18 DIAGNOSIS — M25551 Pain in right hip: Secondary | ICD-10-CM | POA: Diagnosis not present

## 2015-05-18 DIAGNOSIS — M9904 Segmental and somatic dysfunction of sacral region: Secondary | ICD-10-CM | POA: Diagnosis not present

## 2015-05-19 DIAGNOSIS — D692 Other nonthrombocytopenic purpura: Secondary | ICD-10-CM | POA: Diagnosis not present

## 2015-05-19 DIAGNOSIS — Z85828 Personal history of other malignant neoplasm of skin: Secondary | ICD-10-CM | POA: Diagnosis not present

## 2015-05-19 DIAGNOSIS — I788 Other diseases of capillaries: Secondary | ICD-10-CM | POA: Diagnosis not present

## 2015-05-19 DIAGNOSIS — L821 Other seborrheic keratosis: Secondary | ICD-10-CM | POA: Diagnosis not present

## 2015-05-19 DIAGNOSIS — L309 Dermatitis, unspecified: Secondary | ICD-10-CM | POA: Diagnosis not present

## 2015-05-19 DIAGNOSIS — L570 Actinic keratosis: Secondary | ICD-10-CM | POA: Diagnosis not present

## 2015-05-20 DIAGNOSIS — M9902 Segmental and somatic dysfunction of thoracic region: Secondary | ICD-10-CM | POA: Diagnosis not present

## 2015-05-20 DIAGNOSIS — M25552 Pain in left hip: Secondary | ICD-10-CM | POA: Diagnosis not present

## 2015-05-20 DIAGNOSIS — M25551 Pain in right hip: Secondary | ICD-10-CM | POA: Diagnosis not present

## 2015-05-20 DIAGNOSIS — M50323 Other cervical disc degeneration at C6-C7 level: Secondary | ICD-10-CM | POA: Diagnosis not present

## 2015-05-20 DIAGNOSIS — M50322 Other cervical disc degeneration at C5-C6 level: Secondary | ICD-10-CM | POA: Diagnosis not present

## 2015-05-20 DIAGNOSIS — M9901 Segmental and somatic dysfunction of cervical region: Secondary | ICD-10-CM | POA: Diagnosis not present

## 2015-05-20 DIAGNOSIS — M9904 Segmental and somatic dysfunction of sacral region: Secondary | ICD-10-CM | POA: Diagnosis not present

## 2015-05-20 DIAGNOSIS — M4003 Postural kyphosis, cervicothoracic region: Secondary | ICD-10-CM | POA: Diagnosis not present

## 2015-05-20 DIAGNOSIS — Q72812 Congenital shortening of left lower limb: Secondary | ICD-10-CM | POA: Diagnosis not present

## 2015-05-20 DIAGNOSIS — M9905 Segmental and somatic dysfunction of pelvic region: Secondary | ICD-10-CM | POA: Diagnosis not present

## 2015-05-24 DIAGNOSIS — M25552 Pain in left hip: Secondary | ICD-10-CM | POA: Diagnosis not present

## 2015-05-24 DIAGNOSIS — M9901 Segmental and somatic dysfunction of cervical region: Secondary | ICD-10-CM | POA: Diagnosis not present

## 2015-05-24 DIAGNOSIS — M9902 Segmental and somatic dysfunction of thoracic region: Secondary | ICD-10-CM | POA: Diagnosis not present

## 2015-05-24 DIAGNOSIS — M9904 Segmental and somatic dysfunction of sacral region: Secondary | ICD-10-CM | POA: Diagnosis not present

## 2015-05-24 DIAGNOSIS — M9905 Segmental and somatic dysfunction of pelvic region: Secondary | ICD-10-CM | POA: Diagnosis not present

## 2015-05-24 DIAGNOSIS — M50323 Other cervical disc degeneration at C6-C7 level: Secondary | ICD-10-CM | POA: Diagnosis not present

## 2015-05-24 DIAGNOSIS — M25551 Pain in right hip: Secondary | ICD-10-CM | POA: Diagnosis not present

## 2015-05-24 DIAGNOSIS — Q72812 Congenital shortening of left lower limb: Secondary | ICD-10-CM | POA: Diagnosis not present

## 2015-05-24 DIAGNOSIS — M4003 Postural kyphosis, cervicothoracic region: Secondary | ICD-10-CM | POA: Diagnosis not present

## 2015-05-24 DIAGNOSIS — M50322 Other cervical disc degeneration at C5-C6 level: Secondary | ICD-10-CM | POA: Diagnosis not present

## 2015-06-06 DIAGNOSIS — Q72812 Congenital shortening of left lower limb: Secondary | ICD-10-CM | POA: Diagnosis not present

## 2015-06-06 DIAGNOSIS — M9905 Segmental and somatic dysfunction of pelvic region: Secondary | ICD-10-CM | POA: Diagnosis not present

## 2015-06-06 DIAGNOSIS — M4003 Postural kyphosis, cervicothoracic region: Secondary | ICD-10-CM | POA: Diagnosis not present

## 2015-06-06 DIAGNOSIS — M25552 Pain in left hip: Secondary | ICD-10-CM | POA: Diagnosis not present

## 2015-06-06 DIAGNOSIS — M9901 Segmental and somatic dysfunction of cervical region: Secondary | ICD-10-CM | POA: Diagnosis not present

## 2015-06-06 DIAGNOSIS — M50322 Other cervical disc degeneration at C5-C6 level: Secondary | ICD-10-CM | POA: Diagnosis not present

## 2015-06-06 DIAGNOSIS — M9902 Segmental and somatic dysfunction of thoracic region: Secondary | ICD-10-CM | POA: Diagnosis not present

## 2015-06-06 DIAGNOSIS — M25551 Pain in right hip: Secondary | ICD-10-CM | POA: Diagnosis not present

## 2015-06-06 DIAGNOSIS — M9904 Segmental and somatic dysfunction of sacral region: Secondary | ICD-10-CM | POA: Diagnosis not present

## 2015-06-06 DIAGNOSIS — M50323 Other cervical disc degeneration at C6-C7 level: Secondary | ICD-10-CM | POA: Diagnosis not present

## 2015-06-07 DIAGNOSIS — M9901 Segmental and somatic dysfunction of cervical region: Secondary | ICD-10-CM | POA: Diagnosis not present

## 2015-06-07 DIAGNOSIS — M4003 Postural kyphosis, cervicothoracic region: Secondary | ICD-10-CM | POA: Diagnosis not present

## 2015-06-07 DIAGNOSIS — M25552 Pain in left hip: Secondary | ICD-10-CM | POA: Diagnosis not present

## 2015-06-07 DIAGNOSIS — M9904 Segmental and somatic dysfunction of sacral region: Secondary | ICD-10-CM | POA: Diagnosis not present

## 2015-06-07 DIAGNOSIS — Q72812 Congenital shortening of left lower limb: Secondary | ICD-10-CM | POA: Diagnosis not present

## 2015-06-07 DIAGNOSIS — M50323 Other cervical disc degeneration at C6-C7 level: Secondary | ICD-10-CM | POA: Diagnosis not present

## 2015-06-07 DIAGNOSIS — M9905 Segmental and somatic dysfunction of pelvic region: Secondary | ICD-10-CM | POA: Diagnosis not present

## 2015-06-07 DIAGNOSIS — M50322 Other cervical disc degeneration at C5-C6 level: Secondary | ICD-10-CM | POA: Diagnosis not present

## 2015-06-07 DIAGNOSIS — M9902 Segmental and somatic dysfunction of thoracic region: Secondary | ICD-10-CM | POA: Diagnosis not present

## 2015-06-07 DIAGNOSIS — M25551 Pain in right hip: Secondary | ICD-10-CM | POA: Diagnosis not present

## 2015-06-09 DIAGNOSIS — M9904 Segmental and somatic dysfunction of sacral region: Secondary | ICD-10-CM | POA: Diagnosis not present

## 2015-06-09 DIAGNOSIS — M50322 Other cervical disc degeneration at C5-C6 level: Secondary | ICD-10-CM | POA: Diagnosis not present

## 2015-06-09 DIAGNOSIS — M4003 Postural kyphosis, cervicothoracic region: Secondary | ICD-10-CM | POA: Diagnosis not present

## 2015-06-09 DIAGNOSIS — M50323 Other cervical disc degeneration at C6-C7 level: Secondary | ICD-10-CM | POA: Diagnosis not present

## 2015-06-09 DIAGNOSIS — M9901 Segmental and somatic dysfunction of cervical region: Secondary | ICD-10-CM | POA: Diagnosis not present

## 2015-06-09 DIAGNOSIS — M25551 Pain in right hip: Secondary | ICD-10-CM | POA: Diagnosis not present

## 2015-06-09 DIAGNOSIS — Q72812 Congenital shortening of left lower limb: Secondary | ICD-10-CM | POA: Diagnosis not present

## 2015-06-09 DIAGNOSIS — M9905 Segmental and somatic dysfunction of pelvic region: Secondary | ICD-10-CM | POA: Diagnosis not present

## 2015-06-09 DIAGNOSIS — M9902 Segmental and somatic dysfunction of thoracic region: Secondary | ICD-10-CM | POA: Diagnosis not present

## 2015-06-09 DIAGNOSIS — M25552 Pain in left hip: Secondary | ICD-10-CM | POA: Diagnosis not present

## 2015-06-13 DIAGNOSIS — M9904 Segmental and somatic dysfunction of sacral region: Secondary | ICD-10-CM | POA: Diagnosis not present

## 2015-06-13 DIAGNOSIS — M9902 Segmental and somatic dysfunction of thoracic region: Secondary | ICD-10-CM | POA: Diagnosis not present

## 2015-06-13 DIAGNOSIS — M25552 Pain in left hip: Secondary | ICD-10-CM | POA: Diagnosis not present

## 2015-06-13 DIAGNOSIS — Q72812 Congenital shortening of left lower limb: Secondary | ICD-10-CM | POA: Diagnosis not present

## 2015-06-13 DIAGNOSIS — M25551 Pain in right hip: Secondary | ICD-10-CM | POA: Diagnosis not present

## 2015-06-13 DIAGNOSIS — M9901 Segmental and somatic dysfunction of cervical region: Secondary | ICD-10-CM | POA: Diagnosis not present

## 2015-06-13 DIAGNOSIS — M9905 Segmental and somatic dysfunction of pelvic region: Secondary | ICD-10-CM | POA: Diagnosis not present

## 2015-06-13 DIAGNOSIS — M4003 Postural kyphosis, cervicothoracic region: Secondary | ICD-10-CM | POA: Diagnosis not present

## 2015-06-13 DIAGNOSIS — M50322 Other cervical disc degeneration at C5-C6 level: Secondary | ICD-10-CM | POA: Diagnosis not present

## 2015-06-13 DIAGNOSIS — M50323 Other cervical disc degeneration at C6-C7 level: Secondary | ICD-10-CM | POA: Diagnosis not present

## 2015-06-15 DIAGNOSIS — N393 Stress incontinence (female) (male): Secondary | ICD-10-CM | POA: Diagnosis not present

## 2015-06-15 DIAGNOSIS — N8110 Cystocele, unspecified: Secondary | ICD-10-CM | POA: Diagnosis not present

## 2015-06-15 DIAGNOSIS — N816 Rectocele: Secondary | ICD-10-CM | POA: Diagnosis not present

## 2015-06-15 DIAGNOSIS — K469 Unspecified abdominal hernia without obstruction or gangrene: Secondary | ICD-10-CM | POA: Diagnosis not present

## 2015-06-17 ENCOUNTER — Telehealth: Payer: Self-pay | Admitting: Internal Medicine

## 2015-06-17 DIAGNOSIS — Q72812 Congenital shortening of left lower limb: Secondary | ICD-10-CM | POA: Diagnosis not present

## 2015-06-17 DIAGNOSIS — M50323 Other cervical disc degeneration at C6-C7 level: Secondary | ICD-10-CM | POA: Diagnosis not present

## 2015-06-17 DIAGNOSIS — M4003 Postural kyphosis, cervicothoracic region: Secondary | ICD-10-CM | POA: Diagnosis not present

## 2015-06-17 DIAGNOSIS — M9902 Segmental and somatic dysfunction of thoracic region: Secondary | ICD-10-CM | POA: Diagnosis not present

## 2015-06-17 DIAGNOSIS — M50322 Other cervical disc degeneration at C5-C6 level: Secondary | ICD-10-CM | POA: Diagnosis not present

## 2015-06-17 DIAGNOSIS — M9905 Segmental and somatic dysfunction of pelvic region: Secondary | ICD-10-CM | POA: Diagnosis not present

## 2015-06-17 DIAGNOSIS — M25551 Pain in right hip: Secondary | ICD-10-CM | POA: Diagnosis not present

## 2015-06-17 DIAGNOSIS — M9904 Segmental and somatic dysfunction of sacral region: Secondary | ICD-10-CM | POA: Diagnosis not present

## 2015-06-17 DIAGNOSIS — M25552 Pain in left hip: Secondary | ICD-10-CM | POA: Diagnosis not present

## 2015-06-17 DIAGNOSIS — M9901 Segmental and somatic dysfunction of cervical region: Secondary | ICD-10-CM | POA: Diagnosis not present

## 2015-06-17 NOTE — Telephone Encounter (Signed)
07/06/15 appt with Dr Carlean Purl at 1:45 pm pt is aware

## 2015-06-21 DIAGNOSIS — M50323 Other cervical disc degeneration at C6-C7 level: Secondary | ICD-10-CM | POA: Diagnosis not present

## 2015-06-21 DIAGNOSIS — M9902 Segmental and somatic dysfunction of thoracic region: Secondary | ICD-10-CM | POA: Diagnosis not present

## 2015-06-21 DIAGNOSIS — M4003 Postural kyphosis, cervicothoracic region: Secondary | ICD-10-CM | POA: Diagnosis not present

## 2015-06-21 DIAGNOSIS — M9905 Segmental and somatic dysfunction of pelvic region: Secondary | ICD-10-CM | POA: Diagnosis not present

## 2015-06-21 DIAGNOSIS — Q72812 Congenital shortening of left lower limb: Secondary | ICD-10-CM | POA: Diagnosis not present

## 2015-06-21 DIAGNOSIS — M9901 Segmental and somatic dysfunction of cervical region: Secondary | ICD-10-CM | POA: Diagnosis not present

## 2015-06-21 DIAGNOSIS — M50322 Other cervical disc degeneration at C5-C6 level: Secondary | ICD-10-CM | POA: Diagnosis not present

## 2015-06-21 DIAGNOSIS — M25551 Pain in right hip: Secondary | ICD-10-CM | POA: Diagnosis not present

## 2015-06-21 DIAGNOSIS — M9904 Segmental and somatic dysfunction of sacral region: Secondary | ICD-10-CM | POA: Diagnosis not present

## 2015-06-21 DIAGNOSIS — M25552 Pain in left hip: Secondary | ICD-10-CM | POA: Diagnosis not present

## 2015-06-24 DIAGNOSIS — M9904 Segmental and somatic dysfunction of sacral region: Secondary | ICD-10-CM | POA: Diagnosis not present

## 2015-06-24 DIAGNOSIS — M50323 Other cervical disc degeneration at C6-C7 level: Secondary | ICD-10-CM | POA: Diagnosis not present

## 2015-06-24 DIAGNOSIS — M25552 Pain in left hip: Secondary | ICD-10-CM | POA: Diagnosis not present

## 2015-06-24 DIAGNOSIS — M9901 Segmental and somatic dysfunction of cervical region: Secondary | ICD-10-CM | POA: Diagnosis not present

## 2015-06-24 DIAGNOSIS — M4003 Postural kyphosis, cervicothoracic region: Secondary | ICD-10-CM | POA: Diagnosis not present

## 2015-06-24 DIAGNOSIS — M9902 Segmental and somatic dysfunction of thoracic region: Secondary | ICD-10-CM | POA: Diagnosis not present

## 2015-06-24 DIAGNOSIS — M50322 Other cervical disc degeneration at C5-C6 level: Secondary | ICD-10-CM | POA: Diagnosis not present

## 2015-06-24 DIAGNOSIS — M9905 Segmental and somatic dysfunction of pelvic region: Secondary | ICD-10-CM | POA: Diagnosis not present

## 2015-06-24 DIAGNOSIS — Q72812 Congenital shortening of left lower limb: Secondary | ICD-10-CM | POA: Diagnosis not present

## 2015-06-24 DIAGNOSIS — M25551 Pain in right hip: Secondary | ICD-10-CM | POA: Diagnosis not present

## 2015-06-27 DIAGNOSIS — Q72812 Congenital shortening of left lower limb: Secondary | ICD-10-CM | POA: Diagnosis not present

## 2015-06-27 DIAGNOSIS — M25551 Pain in right hip: Secondary | ICD-10-CM | POA: Diagnosis not present

## 2015-06-27 DIAGNOSIS — M25552 Pain in left hip: Secondary | ICD-10-CM | POA: Diagnosis not present

## 2015-06-27 DIAGNOSIS — M4003 Postural kyphosis, cervicothoracic region: Secondary | ICD-10-CM | POA: Diagnosis not present

## 2015-06-27 DIAGNOSIS — M9904 Segmental and somatic dysfunction of sacral region: Secondary | ICD-10-CM | POA: Diagnosis not present

## 2015-06-27 DIAGNOSIS — M50322 Other cervical disc degeneration at C5-C6 level: Secondary | ICD-10-CM | POA: Diagnosis not present

## 2015-06-27 DIAGNOSIS — M9902 Segmental and somatic dysfunction of thoracic region: Secondary | ICD-10-CM | POA: Diagnosis not present

## 2015-06-27 DIAGNOSIS — M50323 Other cervical disc degeneration at C6-C7 level: Secondary | ICD-10-CM | POA: Diagnosis not present

## 2015-06-27 DIAGNOSIS — M9905 Segmental and somatic dysfunction of pelvic region: Secondary | ICD-10-CM | POA: Diagnosis not present

## 2015-06-27 DIAGNOSIS — M9901 Segmental and somatic dysfunction of cervical region: Secondary | ICD-10-CM | POA: Diagnosis not present

## 2015-07-01 DIAGNOSIS — M9904 Segmental and somatic dysfunction of sacral region: Secondary | ICD-10-CM | POA: Diagnosis not present

## 2015-07-01 DIAGNOSIS — Z85828 Personal history of other malignant neoplasm of skin: Secondary | ICD-10-CM | POA: Diagnosis not present

## 2015-07-01 DIAGNOSIS — M9902 Segmental and somatic dysfunction of thoracic region: Secondary | ICD-10-CM | POA: Diagnosis not present

## 2015-07-01 DIAGNOSIS — M50322 Other cervical disc degeneration at C5-C6 level: Secondary | ICD-10-CM | POA: Diagnosis not present

## 2015-07-01 DIAGNOSIS — M25552 Pain in left hip: Secondary | ICD-10-CM | POA: Diagnosis not present

## 2015-07-01 DIAGNOSIS — M4003 Postural kyphosis, cervicothoracic region: Secondary | ICD-10-CM | POA: Diagnosis not present

## 2015-07-01 DIAGNOSIS — Q72812 Congenital shortening of left lower limb: Secondary | ICD-10-CM | POA: Diagnosis not present

## 2015-07-01 DIAGNOSIS — M9905 Segmental and somatic dysfunction of pelvic region: Secondary | ICD-10-CM | POA: Diagnosis not present

## 2015-07-01 DIAGNOSIS — M50323 Other cervical disc degeneration at C6-C7 level: Secondary | ICD-10-CM | POA: Diagnosis not present

## 2015-07-01 DIAGNOSIS — L820 Inflamed seborrheic keratosis: Secondary | ICD-10-CM | POA: Diagnosis not present

## 2015-07-01 DIAGNOSIS — M9901 Segmental and somatic dysfunction of cervical region: Secondary | ICD-10-CM | POA: Diagnosis not present

## 2015-07-01 DIAGNOSIS — M25551 Pain in right hip: Secondary | ICD-10-CM | POA: Diagnosis not present

## 2015-07-06 ENCOUNTER — Other Ambulatory Visit (INDEPENDENT_AMBULATORY_CARE_PROVIDER_SITE_OTHER): Payer: PPO

## 2015-07-06 ENCOUNTER — Encounter: Payer: Self-pay | Admitting: Internal Medicine

## 2015-07-06 ENCOUNTER — Ambulatory Visit (INDEPENDENT_AMBULATORY_CARE_PROVIDER_SITE_OTHER): Payer: PPO | Admitting: Internal Medicine

## 2015-07-06 VITALS — BP 134/70 | HR 80 | Ht 62.0 in | Wt 139.4 lb

## 2015-07-06 DIAGNOSIS — R195 Other fecal abnormalities: Secondary | ICD-10-CM

## 2015-07-06 DIAGNOSIS — R6881 Early satiety: Secondary | ICD-10-CM | POA: Diagnosis not present

## 2015-07-06 DIAGNOSIS — R634 Abnormal weight loss: Secondary | ICD-10-CM

## 2015-07-06 DIAGNOSIS — N816 Rectocele: Secondary | ICD-10-CM | POA: Diagnosis not present

## 2015-07-06 DIAGNOSIS — K589 Irritable bowel syndrome without diarrhea: Secondary | ICD-10-CM

## 2015-07-06 DIAGNOSIS — K52831 Collagenous colitis: Secondary | ICD-10-CM

## 2015-07-06 LAB — CBC WITH DIFFERENTIAL/PLATELET
Basophils Absolute: 0 10*3/uL (ref 0.0–0.1)
Basophils Relative: 0.3 % (ref 0.0–3.0)
Eosinophils Absolute: 0 10*3/uL (ref 0.0–0.7)
Eosinophils Relative: 0.6 % (ref 0.0–5.0)
HCT: 32.9 % — ABNORMAL LOW (ref 36.0–46.0)
Hemoglobin: 11 g/dL — ABNORMAL LOW (ref 12.0–15.0)
Lymphocytes Relative: 26.5 % (ref 12.0–46.0)
Lymphs Abs: 2 10*3/uL (ref 0.7–4.0)
MCHC: 33.4 g/dL (ref 30.0–36.0)
MCV: 93.2 fl (ref 78.0–100.0)
Monocytes Absolute: 0.6 10*3/uL (ref 0.1–1.0)
Monocytes Relative: 7.9 % (ref 3.0–12.0)
Neutro Abs: 4.8 10*3/uL (ref 1.4–7.7)
Neutrophils Relative %: 64.7 % (ref 43.0–77.0)
Platelets: 234 10*3/uL (ref 150.0–400.0)
RBC: 3.53 Mil/uL — ABNORMAL LOW (ref 3.87–5.11)
RDW: 14.1 % (ref 11.5–15.5)
WBC: 7.5 10*3/uL (ref 4.0–10.5)

## 2015-07-06 NOTE — Patient Instructions (Signed)
  You have been scheduled for an endoscopy. Please follow written instructions given to you at your visit today. If you use inhalers (even only as needed), please bring them with you on the day of your procedure. Your physician has requested that you go to www.startemmi.com and enter the access code given to you at your visit today. This web site gives a general overview about your procedure. However, you should still follow specific instructions given to you by our office regarding your preparation for the procedure.    Normal BMI (Body Mass Index- based on height and weight) is between 23 and 30. Your BMI today is Body mass index is 25.49 kg/(m^2). Marland Kitchen Please consider follow up  regarding your BMI with your Primary Care Provider.   I appreciate the opportunity to care for you. Silvano Rusk, MD, [facility]

## 2015-07-06 NOTE — Progress Notes (Signed)
   Subjective:    Patient ID: Beth Wise, female    DOB: July 30, 1925, 82 y.o.   MRN: [id number] Cc: dark stools HPI  Here after seeing urogynecologist ad had dark black stool - heme negative. She had dark black stool x 1 week - says occurred before and after pepto bismol use. She is considering surgery for pelvic floor weakness. Has mild lower abdominal cramps and variable stool calibers and consistencies. Some pre-defecatory abdomibal cramps and nausea and vomiting.Has early satiety. No NSAID/asa.  Medications, allergies, past medical history, past surgical history, family history and social history are reviewed and updated in the EMR.  Review of Systems As above and all oter negative except some mild wekness    Objective:   Physical Exam @BP  134/70 mmHg  Pulse 80  Ht 5\' 2"  (1.575 m)  Wt 139 lb 6 oz (63.22 kg)  BMI 25.49 kg/m2@  General:  NAD Eyes:   anicteric Lungs:  clear Heart::  S1S2 no rubs, murmurs or gallops Abdomen:  soft and nontender, BS+ Ext:   no edema, cyanosis or clubbing  Beth Wise, CMA present.  Anoderm inspection revealed small anal tags Anal wink was absent Digital exam revealed slightly decreased resting  tone and voluntary squeeze. Large rectocele present. Simulated defecation with valsalva revealed appropriate abdominal contraction and descent.   Dark stool heme +   Psych: Mood and affect NL Skin: No rash  Data Reviewed:   Wt Readings from Last 3 Encounters:  07/06/15 139 lb 6 oz (63.22 kg)  02/11/15 144 lb (65.318 kg)  05/20/14 147 lb 1.9 oz (66.733 kg)   PCP notes UroGYN notes       Assessment & Plan:   Encounter Diagnoses  Name Primary?  . Heme + stool Yes  . Loss of weight   . Dark stools   . Early satiety     Cause of problems not cler ? If real GI bleeding Start w/ EGD The risks and benefits as well as alternatives of endoscopic procedure(s) have been discussed and reviewed. All questions answered. The patient agrees  to proceed. If negative need to consider colonoscopy Also recheck CBC Lab Results  Component Value Date   WBC 7.5 07/06/2015   HGB 11.0* 07/06/2015   HCT 32.9* 07/06/2015   MCV 93.2 07/06/2015   PLT 234.0 07/06/2015   I appreciate the opportunity to care for this patient.  [id number] G, MD

## 2015-07-07 ENCOUNTER — Telehealth: Payer: Self-pay | Admitting: Internal Medicine

## 2015-07-07 DIAGNOSIS — Q72812 Congenital shortening of left lower limb: Secondary | ICD-10-CM | POA: Diagnosis not present

## 2015-07-07 DIAGNOSIS — M4003 Postural kyphosis, cervicothoracic region: Secondary | ICD-10-CM | POA: Diagnosis not present

## 2015-07-07 DIAGNOSIS — M50323 Other cervical disc degeneration at C6-C7 level: Secondary | ICD-10-CM | POA: Diagnosis not present

## 2015-07-07 DIAGNOSIS — M25551 Pain in right hip: Secondary | ICD-10-CM | POA: Diagnosis not present

## 2015-07-07 DIAGNOSIS — M9901 Segmental and somatic dysfunction of cervical region: Secondary | ICD-10-CM | POA: Diagnosis not present

## 2015-07-07 DIAGNOSIS — M50322 Other cervical disc degeneration at C5-C6 level: Secondary | ICD-10-CM | POA: Diagnosis not present

## 2015-07-07 DIAGNOSIS — M9902 Segmental and somatic dysfunction of thoracic region: Secondary | ICD-10-CM | POA: Diagnosis not present

## 2015-07-07 DIAGNOSIS — M25552 Pain in left hip: Secondary | ICD-10-CM | POA: Diagnosis not present

## 2015-07-07 DIAGNOSIS — M9904 Segmental and somatic dysfunction of sacral region: Secondary | ICD-10-CM | POA: Diagnosis not present

## 2015-07-07 DIAGNOSIS — M9905 Segmental and somatic dysfunction of pelvic region: Secondary | ICD-10-CM | POA: Diagnosis not present

## 2015-07-07 NOTE — Telephone Encounter (Signed)
FYI

## 2015-07-07 NOTE — Addendum Note (Signed)
Addended by: Silvano Rusk E on: 07/07/2015 09:15 PM   Modules accepted: Level of Service

## 2015-07-08 NOTE — Progress Notes (Signed)
Quick Note:  Blood count slightly low as usual - not to worry ______

## 2015-07-11 ENCOUNTER — Telehealth: Payer: Self-pay | Admitting: Internal Medicine

## 2015-07-11 NOTE — Telephone Encounter (Signed)
Patient advised that she can try imodium for her loose stool until her procedure.  All of her questions were answered

## 2015-07-13 ENCOUNTER — Ambulatory Visit (AMBULATORY_SURGERY_CENTER): Payer: PPO | Admitting: Internal Medicine

## 2015-07-13 ENCOUNTER — Telehealth: Payer: Self-pay | Admitting: Internal Medicine

## 2015-07-13 ENCOUNTER — Encounter: Payer: Self-pay | Admitting: Internal Medicine

## 2015-07-13 VITALS — BP 134/57 | HR 66 | Temp 98.6°F | Resp 13 | Ht 62.0 in | Wt 139.0 lb

## 2015-07-13 DIAGNOSIS — R195 Other fecal abnormalities: Secondary | ICD-10-CM | POA: Diagnosis not present

## 2015-07-13 DIAGNOSIS — K2970 Gastritis, unspecified, without bleeding: Secondary | ICD-10-CM | POA: Diagnosis not present

## 2015-07-13 DIAGNOSIS — K2990 Gastroduodenitis, unspecified, without bleeding: Secondary | ICD-10-CM | POA: Diagnosis not present

## 2015-07-13 DIAGNOSIS — R634 Abnormal weight loss: Secondary | ICD-10-CM

## 2015-07-13 DIAGNOSIS — K3189 Other diseases of stomach and duodenum: Secondary | ICD-10-CM | POA: Diagnosis not present

## 2015-07-13 MED ORDER — SODIUM CHLORIDE 0.9 % IV SOLN: 500.0000 mL | INTRAVENOUS | Status: DC

## 2015-07-13 NOTE — Progress Notes (Signed)
Called to room to assist during endoscopic procedure.  Patient ID and intended procedure confirmed with present staff. Received instructions for my participation in the procedure from the performing physician.  

## 2015-07-13 NOTE — Progress Notes (Signed)
Dental advisory given to patient 

## 2015-07-13 NOTE — Telephone Encounter (Signed)
Patient was notified to resume current medications and that MD waiting on biopsies results. Called by Margy Clarks.

## 2015-07-13 NOTE — Progress Notes (Signed)
A/ox3 pleased with MAC, report to Santiago Glad RN, teeth as pre-op

## 2015-07-13 NOTE — Op Note (Signed)
[facility] Patient Name: Beth Wise Procedure Date: 07/13/2015 11:23 AM MRN: [id number] Endoscopist: Gatha Mayer , MD Age: 82 Referring MD:  Date of Birth: 1925/11/19 Gender: Female Account #: [id number] Procedure:                Upper GI endoscopy Indications:              Epigastric abdominal pain, Heme positive stool,                            Anorexia, Weight loss Medicines:                Propofol per Anesthesia, Monitored Anesthesia Care Procedure:                Pre-Anesthesia Assessment:                           - Prior to the procedure, a History and Physical                            was performed, and patient medications and                            allergies were reviewed. The patient's tolerance of                            previous anesthesia was also reviewed. The risks                            and benefits of the procedure and the sedation                            options and risks were discussed with the patient.                            All questions were answered, and informed consent                            was obtained. Prior Anticoagulants: The patient has                            taken no previous anticoagulant or antiplatelet                            agents. ASA Grade Assessment: II - A patient with                            mild systemic disease. After reviewing the risks                            and benefits, the patient was deemed in                            satisfactory condition to undergo the procedure.  After obtaining informed consent, the endoscope was                            passed under direct vision. Throughout the                            procedure, the patient's blood pressure, pulse, and                            oxygen saturations were monitored continuously. The                            Model GIF-HQ190 [id number]) scope was introduced                            through the  mouth, and advanced to the second part                            of duodenum. The upper GI endoscopy was                            accomplished without difficulty. The patient                            tolerated the procedure well. Scope In: Scope Out: Findings:                 Diffuse moderate inflammation characterized by                            erythema and granularity was found in the entire                            examined stomach. Biopsies were taken with a cold                            forceps for histology. Estimated blood loss was                            minimal.                           The exam was otherwise without abnormality.                           Gastric retroflexion performed. Complications:            No immediate complications. Estimated Blood Loss:     Estimated blood loss was minimal. Impression:               - Gastritis. Biopsied.                           - The examination was otherwise normal. Recommendation:           - Patient has a contact number available for  emergencies. The signs and symptoms of potential                            delayed complications were discussed with the                            patient. Return to normal activities tomorrow.                            Written discharge instructions were provided to the                            patient.                           - Resume previous diet.                           - Continue present medications.                           - Await pathology results. Gatha Mayer, MD 07/13/2015 11:42:15 AM This report has been signed electronically.

## 2015-07-13 NOTE — Patient Instructions (Addendum)
The stomach looks irritated and inflamed. It may be related to your problems - I took biopsies to understand better. I am not suspicious of cancer. We will call you next week.  I appreciate the opportunity to care for you. Gatha Mayer, MD, FACG   YOU HAD AN ENDOSCOPIC PROCEDURE TODAY AT [facility]:   Refer to the procedure report that was given to you for any specific questions about what was found during the examination.  If the procedure report does not answer your questions, please call your gastroenterologist to clarify.  If you requested that your care partner not be given the details of your procedure findings, then the procedure report has been included in a sealed envelope for you to review at your convenience later.  YOU SHOULD EXPECT: Some feelings of bloating in the abdomen. Passage of more gas than usual.  Walking can help get rid of the air that was put into your GI tract during the procedure and reduce the bloating. If you had a lower endoscopy (such as a colonoscopy or flexible sigmoidoscopy) you may notice spotting of blood in your stool or on the toilet paper. If you underwent a bowel prep for your procedure, you may not have a normal bowel movement for a few days.  Please Note:  You might notice some irritation and congestion in your nose or some drainage.  This is from the oxygen used during your procedure.  There is no need for concern and it should clear up in a day or so.  SYMPTOMS TO REPORT IMMEDIATELY:    Following upper endoscopy (EGD)  Vomiting of blood or coffee ground material  New chest pain or pain under the shoulder blades  Painful or persistently difficult swallowing  New shortness of breath  Fever of 100F or higher  Black, tarry-looking stools  For urgent or emergent issues, a gastroenterologist can be reached at any hour by calling [phone].   DIET: Your first meal following the procedure should be a small meal and  then it is ok to progress to your normal diet. Heavy or fried foods are harder to digest and may make you feel nauseous or bloated.  Likewise, meals heavy in dairy and vegetables can increase bloating.  Drink plenty of fluids but you should avoid alcoholic beverages for 24 hours.  ACTIVITY:  You should plan to take it easy for the rest of today and you should NOT DRIVE or use heavy machinery until tomorrow (because of the sedation medicines used during the test).    FOLLOW UP: Our staff will call the number listed on your records the next business day following your procedure to check on you and address any questions or concerns that you may have regarding the information given to you following your procedure. If we do not reach you, we will leave a message.  However, if you are feeling well and you are not experiencing any problems, there is no need to return our call.  We will assume that you have returned to your regular daily activities without incident.  If any biopsies were taken you will be contacted by phone or by letter within the next 1-3 weeks.  Please call us at [phone] if you have not heard about the biopsies in 3 weeks.    SIGNATURES/CONFIDENTIALITY: You and/or your care partner have signed paperwork which will be entered into your electronic medical record.  These signatures attest to the fact that that the  information above on your After Visit Summary has been reviewed and is understood.  Full responsibility of the confidentiality of this discharge information lies with you and/or your care-partner.

## 2015-07-18 NOTE — Progress Notes (Signed)
Quick Note:  Call from office no letter or recall   Let her know biopsies show mild inflammation  Given what she has had a colonoscopy is reasonable (heme + anemia) - if she is willing to do that we can set that up I am ok if she holds off as well - let me know if she has ?'s ______

## 2015-07-19 ENCOUNTER — Telehealth: Payer: Self-pay | Admitting: Internal Medicine

## 2015-07-19 NOTE — Telephone Encounter (Signed)
All questions answered about biopsy results.  She wants to continue to wait on colonoscopy and think about it.  She will call back for additional questions.

## 2015-08-08 DIAGNOSIS — M9905 Segmental and somatic dysfunction of pelvic region: Secondary | ICD-10-CM | POA: Diagnosis not present

## 2015-08-08 DIAGNOSIS — M9901 Segmental and somatic dysfunction of cervical region: Secondary | ICD-10-CM | POA: Diagnosis not present

## 2015-08-08 DIAGNOSIS — Q72812 Congenital shortening of left lower limb: Secondary | ICD-10-CM | POA: Diagnosis not present

## 2015-08-08 DIAGNOSIS — M25551 Pain in right hip: Secondary | ICD-10-CM | POA: Diagnosis not present

## 2015-08-08 DIAGNOSIS — M9904 Segmental and somatic dysfunction of sacral region: Secondary | ICD-10-CM | POA: Diagnosis not present

## 2015-08-08 DIAGNOSIS — M25552 Pain in left hip: Secondary | ICD-10-CM | POA: Diagnosis not present

## 2015-08-08 DIAGNOSIS — M4003 Postural kyphosis, cervicothoracic region: Secondary | ICD-10-CM | POA: Diagnosis not present

## 2015-08-08 DIAGNOSIS — M50323 Other cervical disc degeneration at C6-C7 level: Secondary | ICD-10-CM | POA: Diagnosis not present

## 2015-08-08 DIAGNOSIS — M50322 Other cervical disc degeneration at C5-C6 level: Secondary | ICD-10-CM | POA: Diagnosis not present

## 2015-08-08 DIAGNOSIS — M9902 Segmental and somatic dysfunction of thoracic region: Secondary | ICD-10-CM | POA: Diagnosis not present

## 2015-08-10 DIAGNOSIS — N8110 Cystocele, unspecified: Secondary | ICD-10-CM | POA: Diagnosis not present

## 2015-08-10 DIAGNOSIS — N393 Stress incontinence (female) (male): Secondary | ICD-10-CM | POA: Diagnosis not present

## 2015-08-10 DIAGNOSIS — N816 Rectocele: Secondary | ICD-10-CM | POA: Diagnosis not present

## 2015-08-12 ENCOUNTER — Other Ambulatory Visit: Payer: Self-pay | Admitting: Internal Medicine

## 2015-08-12 DIAGNOSIS — Z1231 Encounter for screening mammogram for malignant neoplasm of breast: Secondary | ICD-10-CM

## 2015-08-15 DIAGNOSIS — M9901 Segmental and somatic dysfunction of cervical region: Secondary | ICD-10-CM | POA: Diagnosis not present

## 2015-08-15 DIAGNOSIS — M25551 Pain in right hip: Secondary | ICD-10-CM | POA: Diagnosis not present

## 2015-08-15 DIAGNOSIS — M50323 Other cervical disc degeneration at C6-C7 level: Secondary | ICD-10-CM | POA: Diagnosis not present

## 2015-08-15 DIAGNOSIS — Q72812 Congenital shortening of left lower limb: Secondary | ICD-10-CM | POA: Diagnosis not present

## 2015-08-15 DIAGNOSIS — M9902 Segmental and somatic dysfunction of thoracic region: Secondary | ICD-10-CM | POA: Diagnosis not present

## 2015-08-15 DIAGNOSIS — M25552 Pain in left hip: Secondary | ICD-10-CM | POA: Diagnosis not present

## 2015-08-15 DIAGNOSIS — M4003 Postural kyphosis, cervicothoracic region: Secondary | ICD-10-CM | POA: Diagnosis not present

## 2015-08-15 DIAGNOSIS — M9904 Segmental and somatic dysfunction of sacral region: Secondary | ICD-10-CM | POA: Diagnosis not present

## 2015-08-15 DIAGNOSIS — M50322 Other cervical disc degeneration at C5-C6 level: Secondary | ICD-10-CM | POA: Diagnosis not present

## 2015-08-15 DIAGNOSIS — M9905 Segmental and somatic dysfunction of pelvic region: Secondary | ICD-10-CM | POA: Diagnosis not present

## 2015-08-29 ENCOUNTER — Ambulatory Visit: Payer: Self-pay | Admitting: Internal Medicine

## 2015-09-02 ENCOUNTER — Ambulatory Visit
Admission: RE | Admit: 2015-09-02 | Discharge: 2015-09-02 | Disposition: A | Discharge status: Home / Self Care | Payer: PPO | Source: Ambulatory Visit | Attending: Internal Medicine | Admitting: Internal Medicine

## 2015-09-02 DIAGNOSIS — Z1231 Encounter for screening mammogram for malignant neoplasm of breast: Secondary | ICD-10-CM | POA: Diagnosis not present

## 2015-09-09 ENCOUNTER — Other Ambulatory Visit: Payer: Self-pay | Admitting: Internal Medicine

## 2015-09-09 DIAGNOSIS — M810 Age-related osteoporosis without current pathological fracture: Secondary | ICD-10-CM

## 2015-09-15 ENCOUNTER — Ambulatory Visit
Admission: RE | Admit: 2015-09-15 | Discharge: 2015-09-15 | Disposition: A | Discharge status: Home / Self Care | Payer: PPO | Source: Ambulatory Visit | Attending: Internal Medicine | Admitting: Internal Medicine

## 2015-09-15 DIAGNOSIS — Z780 Asymptomatic menopausal state: Secondary | ICD-10-CM | POA: Diagnosis not present

## 2015-09-15 DIAGNOSIS — M810 Age-related osteoporosis without current pathological fracture: Secondary | ICD-10-CM

## 2015-09-16 DIAGNOSIS — L570 Actinic keratosis: Secondary | ICD-10-CM | POA: Diagnosis not present

## 2015-09-16 DIAGNOSIS — Z85828 Personal history of other malignant neoplasm of skin: Secondary | ICD-10-CM | POA: Diagnosis not present

## 2015-09-20 DIAGNOSIS — M4003 Postural kyphosis, cervicothoracic region: Secondary | ICD-10-CM | POA: Diagnosis not present

## 2015-09-20 DIAGNOSIS — Q72812 Congenital shortening of left lower limb: Secondary | ICD-10-CM | POA: Diagnosis not present

## 2015-09-20 DIAGNOSIS — M9905 Segmental and somatic dysfunction of pelvic region: Secondary | ICD-10-CM | POA: Diagnosis not present

## 2015-09-20 DIAGNOSIS — M9902 Segmental and somatic dysfunction of thoracic region: Secondary | ICD-10-CM | POA: Diagnosis not present

## 2015-09-20 DIAGNOSIS — M9904 Segmental and somatic dysfunction of sacral region: Secondary | ICD-10-CM | POA: Diagnosis not present

## 2015-09-20 DIAGNOSIS — M50322 Other cervical disc degeneration at C5-C6 level: Secondary | ICD-10-CM | POA: Diagnosis not present

## 2015-09-20 DIAGNOSIS — M50323 Other cervical disc degeneration at C6-C7 level: Secondary | ICD-10-CM | POA: Diagnosis not present

## 2015-09-20 DIAGNOSIS — M9901 Segmental and somatic dysfunction of cervical region: Secondary | ICD-10-CM | POA: Diagnosis not present

## 2015-09-20 DIAGNOSIS — M25551 Pain in right hip: Secondary | ICD-10-CM | POA: Diagnosis not present

## 2015-09-20 DIAGNOSIS — M25552 Pain in left hip: Secondary | ICD-10-CM | POA: Diagnosis not present

## 2015-10-24 DIAGNOSIS — D6489 Other specified anemias: Secondary | ICD-10-CM | POA: Diagnosis not present

## 2015-10-24 DIAGNOSIS — R030 Elevated blood-pressure reading, without diagnosis of hypertension: Secondary | ICD-10-CM | POA: Diagnosis not present

## 2015-10-24 DIAGNOSIS — M810 Age-related osteoporosis without current pathological fracture: Secondary | ICD-10-CM | POA: Diagnosis not present

## 2015-10-24 DIAGNOSIS — N390 Urinary tract infection, site not specified: Secondary | ICD-10-CM | POA: Diagnosis not present

## 2015-10-24 DIAGNOSIS — R8299 Other abnormal findings in urine: Secondary | ICD-10-CM | POA: Diagnosis not present

## 2015-10-31 DIAGNOSIS — M545 Low back pain: Secondary | ICD-10-CM | POA: Diagnosis not present

## 2015-10-31 DIAGNOSIS — Z0000 Encounter for general adult medical examination without abnormal findings: Secondary | ICD-10-CM | POA: Diagnosis not present

## 2015-10-31 DIAGNOSIS — D6489 Other specified anemias: Secondary | ICD-10-CM | POA: Diagnosis not present

## 2015-10-31 DIAGNOSIS — M810 Age-related osteoporosis without current pathological fracture: Secondary | ICD-10-CM | POA: Diagnosis not present

## 2015-10-31 DIAGNOSIS — Z6825 Body mass index (BMI) 25.0-25.9, adult: Secondary | ICD-10-CM | POA: Diagnosis not present

## 2015-10-31 DIAGNOSIS — Z23 Encounter for immunization: Secondary | ICD-10-CM | POA: Diagnosis not present

## 2015-10-31 DIAGNOSIS — Z1389 Encounter for screening for other disorder: Secondary | ICD-10-CM | POA: Diagnosis not present

## 2015-10-31 DIAGNOSIS — F5104 Psychophysiologic insomnia: Secondary | ICD-10-CM | POA: Diagnosis not present

## 2015-10-31 DIAGNOSIS — K529 Noninfective gastroenteritis and colitis, unspecified: Secondary | ICD-10-CM | POA: Diagnosis not present

## 2015-10-31 DIAGNOSIS — K219 Gastro-esophageal reflux disease without esophagitis: Secondary | ICD-10-CM | POA: Diagnosis not present

## 2015-11-03 ENCOUNTER — Other Ambulatory Visit (HOSPITAL_COMMUNITY): Payer: Self-pay | Admitting: *Deleted

## 2015-11-04 ENCOUNTER — Ambulatory Visit (HOSPITAL_COMMUNITY)
Admission: RE | Admit: 2015-11-04 | Discharge: 2015-11-04 | Disposition: A | Discharge status: Home / Self Care | Payer: PPO | Source: Ambulatory Visit | Attending: Internal Medicine | Admitting: Internal Medicine

## 2015-11-04 DIAGNOSIS — M810 Age-related osteoporosis without current pathological fracture: Secondary | ICD-10-CM | POA: Diagnosis not present

## 2015-11-04 MED ORDER — DENOSUMAB 60 MG/ML ~~LOC~~ SOLN
60.0000 mg | Freq: Once | SUBCUTANEOUS | Status: AC
  Administered 2015-11-04: 60 mg via SUBCUTANEOUS
  Filled 2015-11-04: qty 1

## 2015-11-14 DIAGNOSIS — Z6825 Body mass index (BMI) 25.0-25.9, adult: Secondary | ICD-10-CM | POA: Diagnosis not present

## 2015-11-14 DIAGNOSIS — N819 Female genital prolapse, unspecified: Secondary | ICD-10-CM | POA: Diagnosis not present

## 2015-11-14 DIAGNOSIS — Z01419 Encounter for gynecological examination (general) (routine) without abnormal findings: Secondary | ICD-10-CM | POA: Diagnosis not present

## 2015-12-01 DIAGNOSIS — M25552 Pain in left hip: Secondary | ICD-10-CM | POA: Diagnosis not present

## 2015-12-01 DIAGNOSIS — M25551 Pain in right hip: Secondary | ICD-10-CM | POA: Diagnosis not present

## 2015-12-01 DIAGNOSIS — M9904 Segmental and somatic dysfunction of sacral region: Secondary | ICD-10-CM | POA: Diagnosis not present

## 2015-12-01 DIAGNOSIS — M4003 Postural kyphosis, cervicothoracic region: Secondary | ICD-10-CM | POA: Diagnosis not present

## 2015-12-01 DIAGNOSIS — M50322 Other cervical disc degeneration at C5-C6 level: Secondary | ICD-10-CM | POA: Diagnosis not present

## 2015-12-01 DIAGNOSIS — M9901 Segmental and somatic dysfunction of cervical region: Secondary | ICD-10-CM | POA: Diagnosis not present

## 2015-12-01 DIAGNOSIS — M9905 Segmental and somatic dysfunction of pelvic region: Secondary | ICD-10-CM | POA: Diagnosis not present

## 2015-12-01 DIAGNOSIS — Q72812 Congenital shortening of left lower limb: Secondary | ICD-10-CM | POA: Diagnosis not present

## 2015-12-01 DIAGNOSIS — M50323 Other cervical disc degeneration at C6-C7 level: Secondary | ICD-10-CM | POA: Diagnosis not present

## 2015-12-01 DIAGNOSIS — M9902 Segmental and somatic dysfunction of thoracic region: Secondary | ICD-10-CM | POA: Diagnosis not present

## 2016-01-10 NOTE — Progress Notes (Deleted)
HPI: Follow-up hypertension. Previously followed by Dr. Mare Ferrari. Patient had a normal nuclear study in December 2009. Echocardiogram in 2009 showed normal LV function, grade 1 diastolic dysfunction, moderate aortic insufficiency and mild mitral regurgitation. Since last seen  Current Outpatient Prescriptions  Medication Sig Dispense Refill  . acetaminophen (TYLENOL) 500 MG tablet Take 500 mg by mouth every 6 (six) hours as needed for mild pain or headache.    . Calcium-Magnesium-Vitamin D (CALCIUM 1200+D3) 600-40-500 MG-MG-UNIT TB24 Take 1 tablet by mouth daily. Reported on 07/13/2015    . Cholecalciferol (VITAMIN D) 2000 units CAPS Take 1 capsule by mouth daily.    Marland Kitchen CLOBETASOL PROPIONATE EX Apply topically 2 (two) times daily as needed.    Marland Kitchen denosumab (PROLIA) 60 MG/ML SOLN injection Inject 60 mg into the skin every 6 (six) months. Administer in upper arm, thigh, or abdomen    . flurazepam (DALMANE) 15 MG capsule Take 15 mg by mouth at bedtime as needed (sleep).     . fluticasone (FLONASE) 50 MCG/ACT nasal spray Place 1 spray into the nose as needed. Sinus drainage    . GuaiFENesin (MUCINEX PO) Take 1 tablet by mouth every 12 (twelve) hours as needed (congestion).     . hydrocortisone 2.5 % cream Apply topically as needed.    Marland Kitchen ipratropium (ATROVENT) 0.03 % nasal spray As directed nasal spray    . loperamide (IMODIUM A-D) 2 MG tablet Take 2 mg by mouth.    . Multiple Vitamins-Minerals (MULTIVITAMIN WITH MINERALS) tablet Take 1 tablet by mouth daily. 3 times a week    . omeprazole (PRILOSEC) 20 MG capsule Take 20 mg by mouth daily as needed (for acid reflux). As needed -pt doesn't take it everyday    . saccharomyces boulardii (FLORASTOR) 250 MG capsule Take 250 mg by mouth 2 (two) times daily.    . silver sulfADIAZINE (SILVADENE) 1 % cream Apply 1 application topically daily.    . Simethicone (GAS-X PO) Take 1 tablet by mouth as needed (for gas).     . traMADol (ULTRAM) 50 MG tablet  Take by mouth 3 (three) times daily as needed (for pain). Reported on 07/06/2015     No current facility-administered medications for this visit.      Past Medical History:  Diagnosis Date  . Allergy   . Arthritis   . Back pain   . Candidal esophagitis ([place]) 2010  . Chest pain, atypical   . Collagenous colitis   . Diverticulosis   . GERD (gastroesophageal reflux disease)   . Heart murmur   . Hiatal hernia 2010  . History of fibromyalgia   . Hypercalcemia   . Hyperlipidemia   . Hypertension   . Insomnia   . Osteoporosis   . Vaginal prolapse     Past Surgical History:  Procedure Laterality Date  . ABDOMINAL HYSTERECTOMY    . BREAST BIOPSY  1964   left benign  . CARDIOVASCULAR STRESS TEST  12/03/2007   EF 72%  . CATARACT EXTRACTION W/ INTRAOCULAR LENS  IMPLANT, BILATERAL    . COLONOSCOPY  04/24/2006   diverticulosis  . FLEXIBLE SIGMOIDOSCOPY  07/27/2010   diverticulosis, collagenous colitis  . TONSILLECTOMY AND ADENOIDECTOMY    . UPPER GASTROINTESTINAL ENDOSCOPY  01/29/2008   esophagitis, hiatal hernia, gastritis  . US ECHOCARDIOGRAPHY  10/30/2007    EF 55-60%    Social History   Social History  . Marital status: Widowed    Spouse name: N/A  .  Number of children: N/A  . Years of education: N/A   Occupational History  . retired    Social History Main Topics  . Smoking status: Never Smoker  . Smokeless tobacco: Never Used  . Alcohol use Yes     Comment: rarely  . Drug use: No  . Sexual activity: No   Other Topics Concern  . Not on file   Social History Narrative  . No narrative on file    Family History  Problem Relation Age of Onset  . Breast cancer Mother   . Stroke Mother   . Colon cancer Neg Hx   . Colon polyps Brother   . Heart disease Sister   . Heart disease Brother   . Gastric cancer Sister   . Diabetes Neg Hx     ROS: no fevers or chills, productive cough, hemoptysis, dysphasia, odynophagia, melena, hematochezia, dysuria, hematuria,  rash, seizure activity, orthopnea, PND, pedal edema, claudication. Remaining systems are negative.  Physical Exam: Well-developed well-nourished in no acute distress.  Skin is warm and dry.  HEENT is normal.  Neck is supple.  Chest is clear to auscultation with normal expansion.  Cardiovascular exam is regular rate and rhythm.  Abdominal exam nontender or distended. No masses palpated. Extremities show no edema. neuro grossly intact  ECG

## 2016-01-13 ENCOUNTER — Ambulatory Visit: Payer: Self-pay | Admitting: Cardiology

## 2016-02-06 NOTE — Progress Notes (Signed)
HPI: FU hypertension; previously followed by Dr Mare Ferrari. She has a past history of labile hypertension and atypical chest pain. Echocardiogram 11/01/07 showed mild LVH with an ejection fraction of 55-60% and grade 1 diastolic dysfunction. There was moderate aortic insufficiency and mild mitral regurgitation. Adenosine Cardiolite December 2009 normal. Since last seen, the patient has dyspnea with more extreme activities but not with routine activities. It is relieved with rest. It is not associated with chest pain. There is no orthopnea, PND or pedal edema. There is no syncope or palpitations. There is no exertional chest pain.    Current Outpatient Prescriptions  Medication Sig Dispense Refill  . acetaminophen (TYLENOL) 500 MG tablet Take 500 mg by mouth every 6 (six) hours as needed for mild pain or headache.    . Calcium-Magnesium-Vitamin D (CALCIUM 1200+D3) 600-40-500 MG-MG-UNIT TB24 Take 1 tablet by mouth daily. Reported on 07/13/2015    . Cholecalciferol (VITAMIN D) 2000 units CAPS Take 1 capsule by mouth daily.    Marland Kitchen CLOBETASOL PROPIONATE EX Apply topically 2 (two) times daily as needed.    Marland Kitchen denosumab (PROLIA) 60 MG/ML SOLN injection Inject 60 mg into the skin every 6 (six) months. Administer in upper arm, thigh, or abdomen    . flurazepam (DALMANE) 15 MG capsule Take 15 mg by mouth at bedtime as needed (sleep).     . fluticasone (FLONASE) 50 MCG/ACT nasal spray Place 1 spray into the nose as needed. Sinus drainage    . GuaiFENesin (MUCINEX PO) Take 1 tablet by mouth every 12 (twelve) hours as needed (congestion).     . hydrocortisone 2.5 % cream Apply topically as needed.    Marland Kitchen ipratropium (ATROVENT) 0.03 % nasal spray As directed nasal spray    . loperamide (IMODIUM A-D) 2 MG tablet Take 2 mg by mouth.    . Multiple Vitamins-Minerals (MULTIVITAMIN WITH MINERALS) tablet Take 1 tablet by mouth daily. 3 times a week    . omeprazole (PRILOSEC) 20 MG capsule Take 20 mg by mouth daily  as needed (for acid reflux). As needed -pt doesn't take it everyday    . saccharomyces boulardii (FLORASTOR) 250 MG capsule Take 250 mg by mouth 2 (two) times daily.    . silver sulfADIAZINE (SILVADENE) 1 % cream Apply 1 application topically daily.    . Simethicone (GAS-X PO) Take 1 tablet by mouth as needed (for gas).     . traMADol (ULTRAM) 50 MG tablet Take by mouth 3 (three) times daily as needed (for pain). Reported on 07/06/2015     No current facility-administered medications for this visit.      Past Medical History:  Diagnosis Date  . Allergy   . Arthritis   . Back pain   . Candidal esophagitis ([place]) 2010  . Chest pain, atypical   . Collagenous colitis   . Diverticulosis   . GERD (gastroesophageal reflux disease)   . Heart murmur   . Hiatal hernia 2010  . History of fibromyalgia   . Hypercalcemia   . Hyperlipidemia   . Hypertension   . Insomnia   . Osteoporosis   . Vaginal prolapse     Past Surgical History:  Procedure Laterality Date  . ABDOMINAL HYSTERECTOMY    . BREAST BIOPSY  1964   left benign  . CARDIOVASCULAR STRESS TEST  12/03/2007   EF 72%  . CATARACT EXTRACTION W/ INTRAOCULAR LENS  IMPLANT, BILATERAL    . COLONOSCOPY  04/24/2006   diverticulosis  .  FLEXIBLE SIGMOIDOSCOPY  07/27/2010   diverticulosis, collagenous colitis  . TONSILLECTOMY AND ADENOIDECTOMY    . UPPER GASTROINTESTINAL ENDOSCOPY  01/29/2008   esophagitis, hiatal hernia, gastritis  . US ECHOCARDIOGRAPHY  10/30/2007    EF 55-60%    Social History   Social History  . Marital status: Widowed    Spouse name: N/A  . Number of children: N/A  . Years of education: N/A   Occupational History  . retired    Social History Main Topics  . Smoking status: Never Smoker  . Smokeless tobacco: Never Used  . Alcohol use Yes     Comment: rarely  . Drug use: No  . Sexual activity: No   Other Topics Concern  . Not on file   Social History Narrative  . No narrative on file    Family  History  Problem Relation Age of Onset  . Breast cancer Mother   . Stroke Mother   . Colon polyps Brother   . Heart disease Sister   . Heart disease Brother   . Gastric cancer Sister   . Colon cancer Neg Hx   . Diabetes Neg Hx     ROS: back pain but no fevers or chills, productive cough, hemoptysis, dysphasia, odynophagia, melena, hematochezia, dysuria, hematuria, rash, seizure activity, orthopnea, PND, pedal edema, claudication. Remaining systems are negative.  Physical Exam: Well-developed well-nourished in no acute distress.  Skin is warm and dry.  HEENT is normal.  Neck is supple.  Chest is clear to auscultation with normal expansion.  Cardiovascular exam is regular rate and rhythm. 2/6 SM Abdominal exam nontender or distended. No masses palpated. Extremities show no edema. neuro grossly intact  ECG-sinus rhythm at a rate of 66. Nonspecific ST changes.  A/P  1 hypertension-blood pressure is reasonable for patient's age. Continue to follow.  2 hyperlipidemia- Management per primary care.  3 GERD -followed by GI.  4 AI-schedule fu echo.  Kirk Ruths, MD

## 2016-02-10 ENCOUNTER — Ambulatory Visit (INDEPENDENT_AMBULATORY_CARE_PROVIDER_SITE_OTHER): Payer: Medicare HMO | Admitting: Cardiology

## 2016-02-10 ENCOUNTER — Encounter: Payer: Self-pay | Admitting: Cardiology

## 2016-02-10 VITALS — BP 144/66 | HR 66 | Ht 63.0 in | Wt 139.0 lb

## 2016-02-10 DIAGNOSIS — I10 Essential (primary) hypertension: Secondary | ICD-10-CM | POA: Diagnosis not present

## 2016-02-10 DIAGNOSIS — I359 Nonrheumatic aortic valve disorder, unspecified: Secondary | ICD-10-CM

## 2016-02-10 DIAGNOSIS — E7800 Pure hypercholesterolemia, unspecified: Secondary | ICD-10-CM

## 2016-02-10 NOTE — Patient Instructions (Signed)

## 2016-02-28 ENCOUNTER — Other Ambulatory Visit: Payer: Self-pay

## 2016-02-28 ENCOUNTER — Ambulatory Visit (HOSPITAL_COMMUNITY): Payer: Medicare HMO | Attending: Cardiovascular Disease

## 2016-02-28 DIAGNOSIS — I10 Essential (primary) hypertension: Secondary | ICD-10-CM | POA: Diagnosis not present

## 2016-02-28 DIAGNOSIS — E785 Hyperlipidemia, unspecified: Secondary | ICD-10-CM | POA: Diagnosis not present

## 2016-02-28 DIAGNOSIS — I359 Nonrheumatic aortic valve disorder, unspecified: Secondary | ICD-10-CM

## 2016-02-28 DIAGNOSIS — I083 Combined rheumatic disorders of mitral, aortic and tricuspid valves: Secondary | ICD-10-CM | POA: Diagnosis not present

## 2016-03-01 ENCOUNTER — Ambulatory Visit: Payer: Self-pay | Admitting: Cardiology

## 2016-03-01 DIAGNOSIS — Z85828 Personal history of other malignant neoplasm of skin: Secondary | ICD-10-CM | POA: Diagnosis not present

## 2016-03-01 DIAGNOSIS — L821 Other seborrheic keratosis: Secondary | ICD-10-CM | POA: Diagnosis not present

## 2016-03-01 DIAGNOSIS — D1801 Hemangioma of skin and subcutaneous tissue: Secondary | ICD-10-CM | POA: Diagnosis not present

## 2016-03-01 DIAGNOSIS — L723 Sebaceous cyst: Secondary | ICD-10-CM | POA: Diagnosis not present

## 2016-03-01 DIAGNOSIS — L661 Lichen planopilaris: Secondary | ICD-10-CM | POA: Diagnosis not present

## 2016-03-01 DIAGNOSIS — L918 Other hypertrophic disorders of the skin: Secondary | ICD-10-CM | POA: Diagnosis not present

## 2016-03-01 DIAGNOSIS — L570 Actinic keratosis: Secondary | ICD-10-CM | POA: Diagnosis not present

## 2016-03-01 DIAGNOSIS — L814 Other melanin hyperpigmentation: Secondary | ICD-10-CM | POA: Diagnosis not present

## 2016-03-01 DIAGNOSIS — L438 Other lichen planus: Secondary | ICD-10-CM | POA: Diagnosis not present

## 2016-03-14 IMAGING — US US ABDOMEN LIMITED
1 series · 14 of 25 positions shown · non-contrast
Comparison: 04/25/2006

CLINICAL DATA: Epigastric abdominal pain.

EXAM:
US ABDOMEN LIMITED - RIGHT UPPER QUADRANT

[Series 1: us abdomen limited · 0.23mm/px · 14 of 47 slices shown]
[im 1/47]
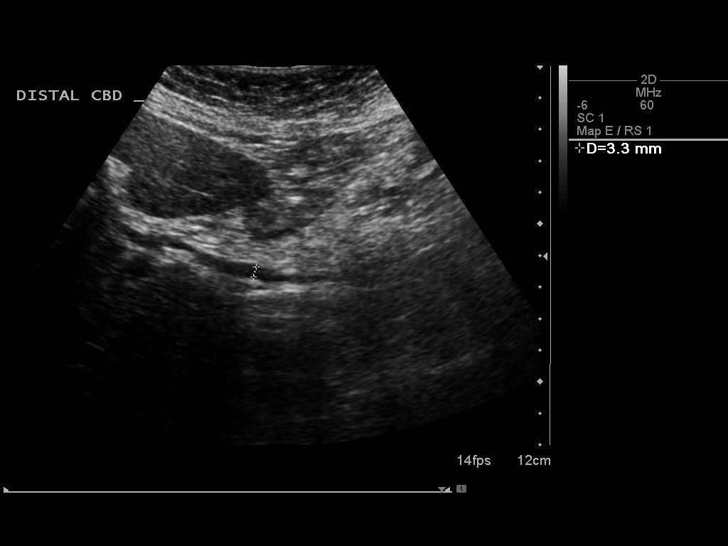
[im 4/47]
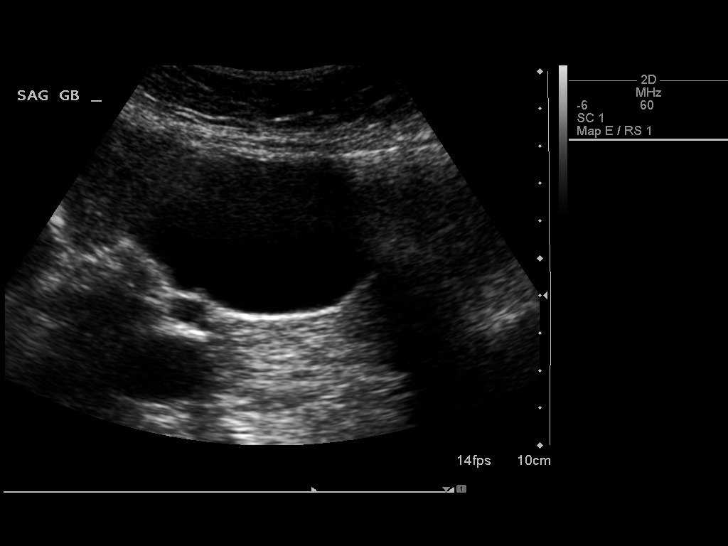
[im 8/47]
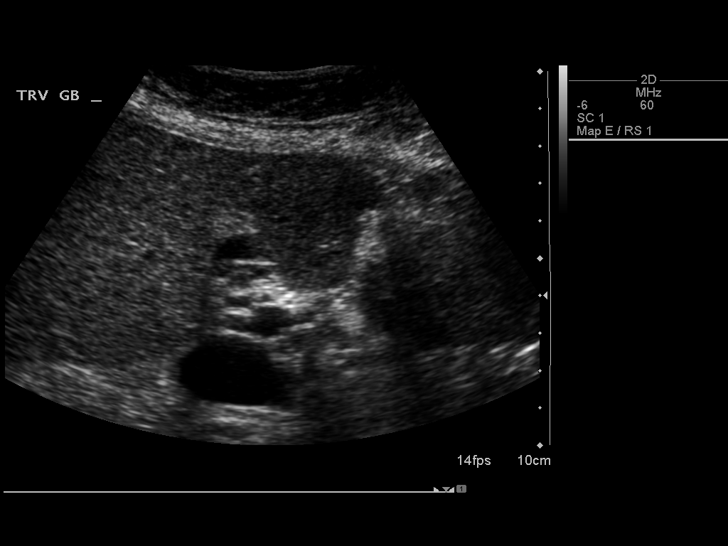
[im 12/47]
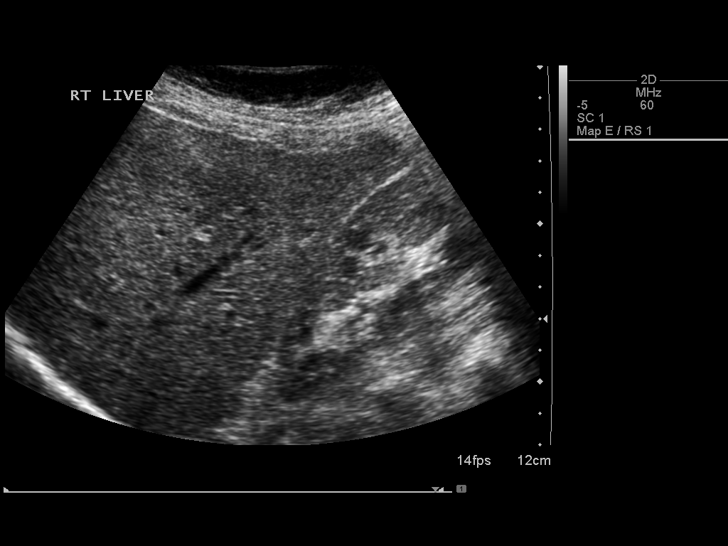
[im 16/47]
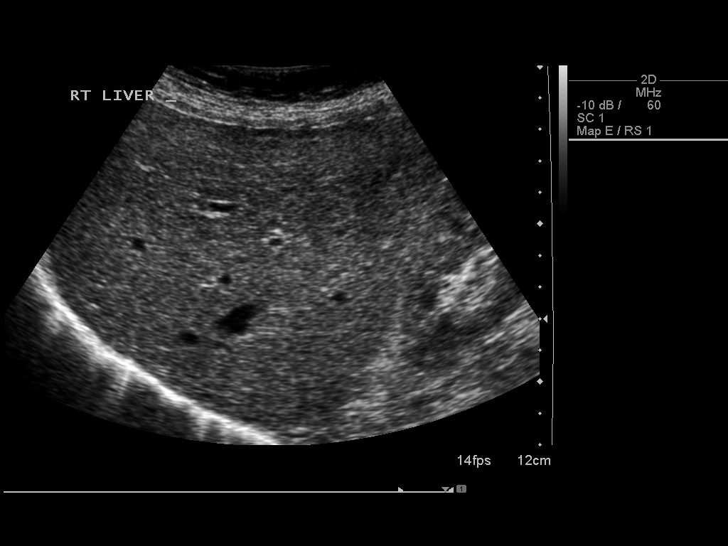
[im 18/47]
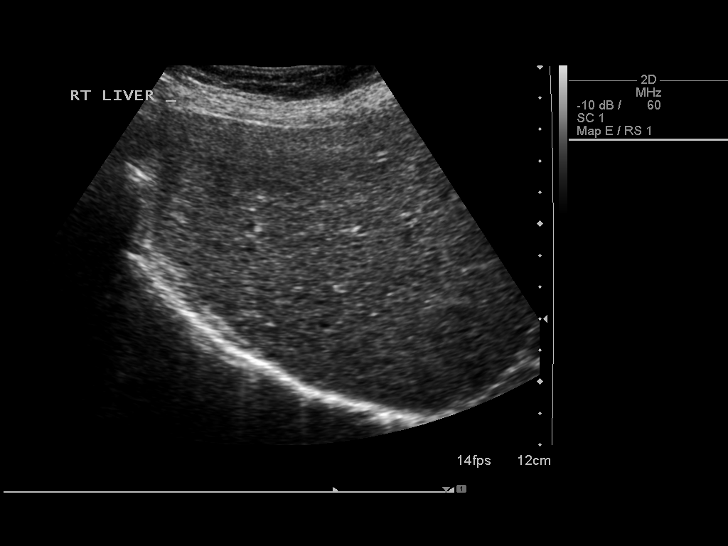
[im 22/47]
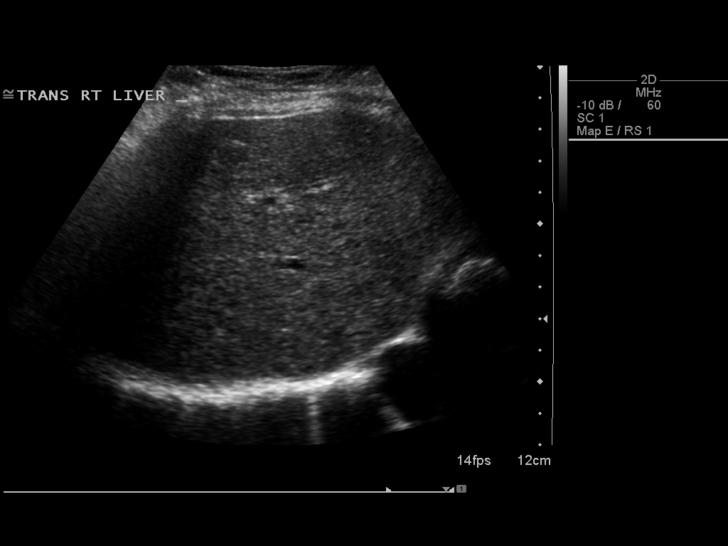
[im 25/47]
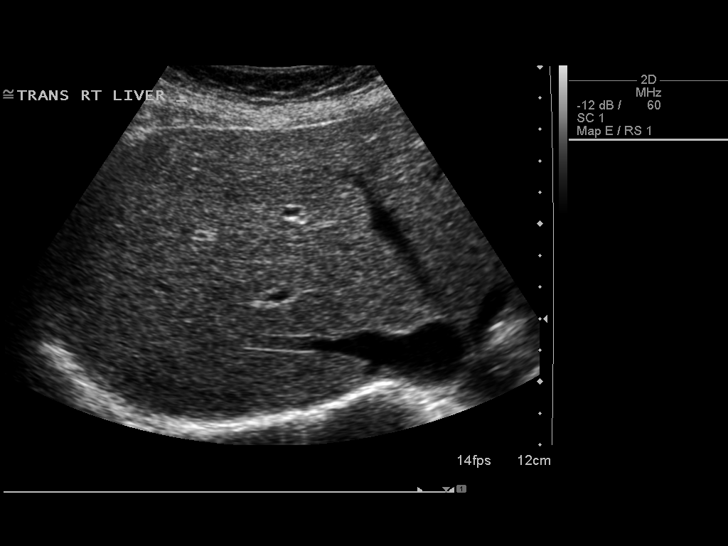
[im 29/47]
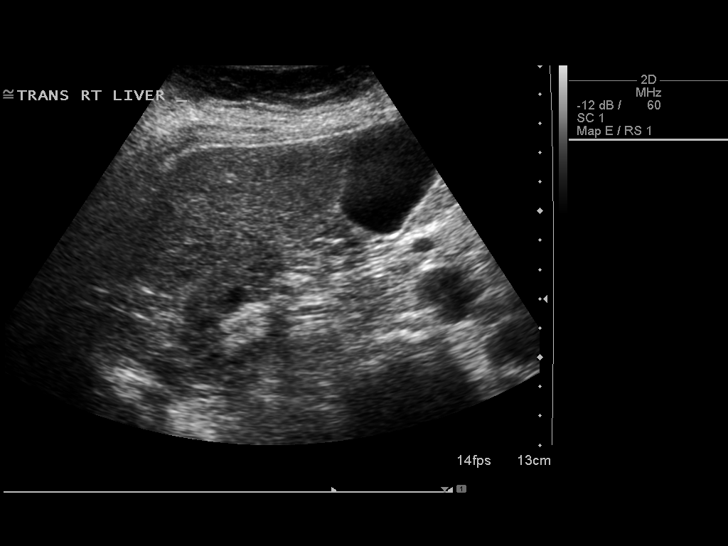
[im 31/47]
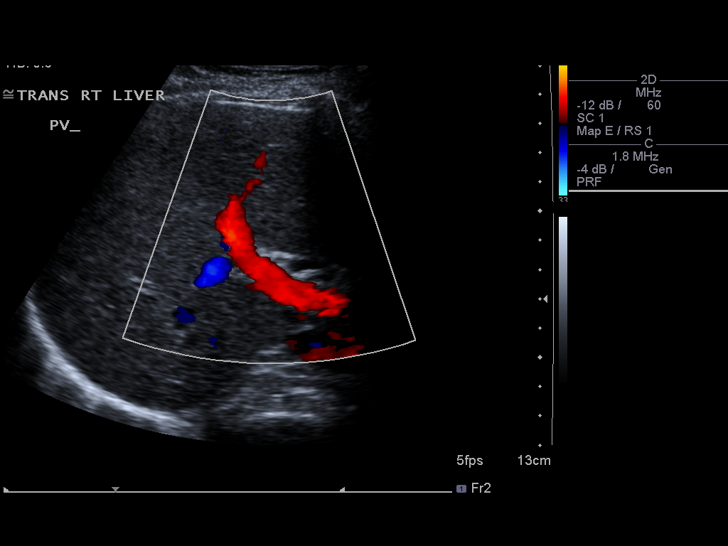
[im 35/47]
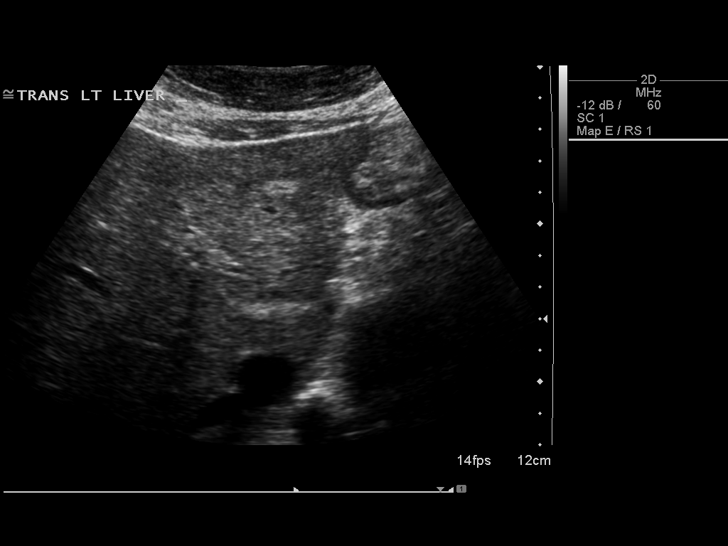
[im 39/47]
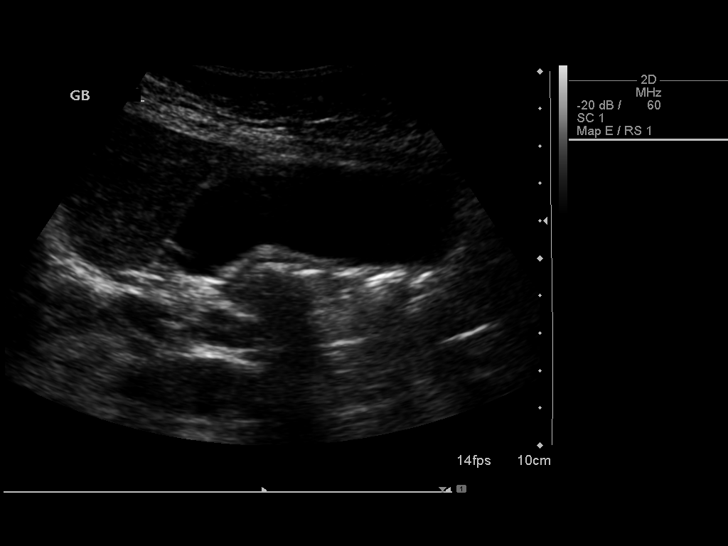
[im 43/47]
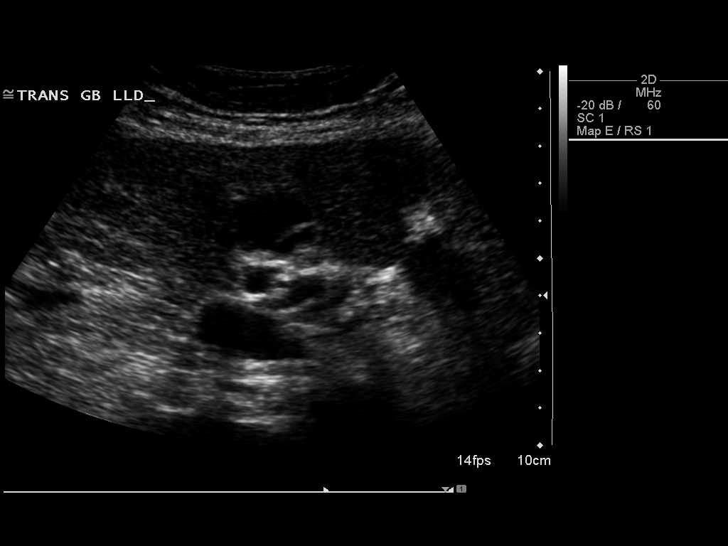
[im 47/47]
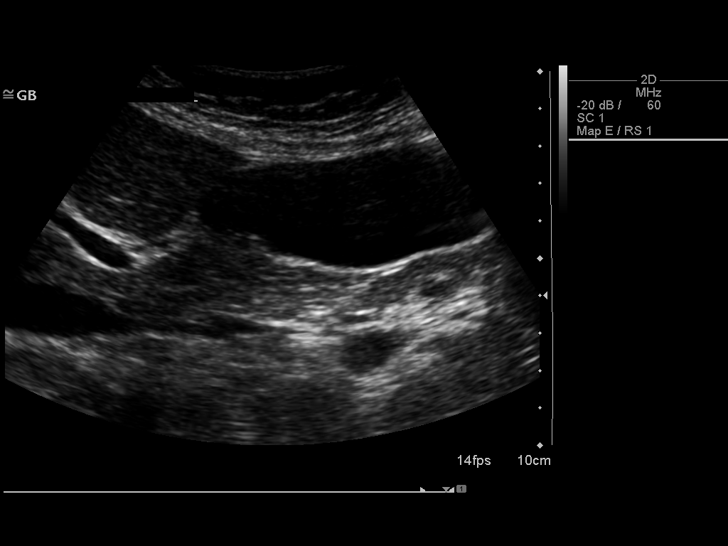

[14 of 25 positions shown; findings below may reference images not displayed]

FINDINGS: Gallbladder:

No gallstones or wall thickening visualized. No sonographic Murphy
sign noted.

Common bile duct:

Diameter: 3.3 mm

Liver:

Normal echogenicity without focal lesion or biliary dilatation.
IMPRESSION: Unremarkable right upper quadrant ultrasound examination.

## 2016-04-04 DIAGNOSIS — Z961 Presence of intraocular lens: Secondary | ICD-10-CM | POA: Diagnosis not present

## 2016-04-04 DIAGNOSIS — H52203 Unspecified astigmatism, bilateral: Secondary | ICD-10-CM | POA: Diagnosis not present

## 2016-04-04 DIAGNOSIS — H1851 Endothelial corneal dystrophy: Secondary | ICD-10-CM | POA: Diagnosis not present

## 2016-04-04 DIAGNOSIS — H40053 Ocular hypertension, bilateral: Secondary | ICD-10-CM | POA: Diagnosis not present

## 2016-04-05 DIAGNOSIS — Z0100 Encounter for examination of eyes and vision without abnormal findings: Secondary | ICD-10-CM | POA: Diagnosis not present

## 2016-05-21 DIAGNOSIS — M47815 Spondylosis without myelopathy or radiculopathy, thoracolumbar region: Secondary | ICD-10-CM | POA: Diagnosis not present

## 2016-05-21 DIAGNOSIS — Z6825 Body mass index (BMI) 25.0-25.9, adult: Secondary | ICD-10-CM | POA: Diagnosis not present

## 2016-05-21 DIAGNOSIS — Z7951 Long term (current) use of inhaled steroids: Secondary | ICD-10-CM | POA: Diagnosis not present

## 2016-05-21 DIAGNOSIS — G4700 Insomnia, unspecified: Secondary | ICD-10-CM | POA: Diagnosis not present

## 2016-05-21 DIAGNOSIS — M4805 Spinal stenosis, thoracolumbar region: Secondary | ICD-10-CM | POA: Diagnosis not present

## 2016-05-21 DIAGNOSIS — J302 Other seasonal allergic rhinitis: Secondary | ICD-10-CM | POA: Diagnosis not present

## 2016-05-21 DIAGNOSIS — M13151 Monoarthritis, not elsewhere classified, right hip: Secondary | ICD-10-CM | POA: Diagnosis not present

## 2016-05-21 DIAGNOSIS — Z79899 Other long term (current) drug therapy: Secondary | ICD-10-CM | POA: Diagnosis not present

## 2016-05-21 DIAGNOSIS — M419 Scoliosis, unspecified: Secondary | ICD-10-CM | POA: Diagnosis not present

## 2016-05-21 DIAGNOSIS — L439 Lichen planus, unspecified: Secondary | ICD-10-CM | POA: Diagnosis not present

## 2016-05-21 DIAGNOSIS — Z0000 Encounter for general adult medical examination without abnormal findings: Secondary | ICD-10-CM | POA: Diagnosis not present

## 2016-05-21 DIAGNOSIS — I358 Other nonrheumatic aortic valve disorders: Secondary | ICD-10-CM | POA: Diagnosis not present

## 2016-05-21 DIAGNOSIS — K219 Gastro-esophageal reflux disease without esophagitis: Secondary | ICD-10-CM | POA: Diagnosis not present

## 2016-05-29 DIAGNOSIS — R0989 Other specified symptoms and signs involving the circulatory and respiratory systems: Secondary | ICD-10-CM | POA: Diagnosis not present

## 2016-05-29 DIAGNOSIS — M810 Age-related osteoporosis without current pathological fracture: Secondary | ICD-10-CM | POA: Diagnosis not present

## 2016-05-29 DIAGNOSIS — I340 Nonrheumatic mitral (valve) insufficiency: Secondary | ICD-10-CM | POA: Diagnosis not present

## 2016-05-29 DIAGNOSIS — K219 Gastro-esophageal reflux disease without esophagitis: Secondary | ICD-10-CM | POA: Diagnosis not present

## 2016-05-29 DIAGNOSIS — M48061 Spinal stenosis, lumbar region without neurogenic claudication: Secondary | ICD-10-CM | POA: Diagnosis not present

## 2016-07-03 DIAGNOSIS — R69 Illness, unspecified: Secondary | ICD-10-CM | POA: Diagnosis not present

## 2016-08-30 ENCOUNTER — Ambulatory Visit (INDEPENDENT_AMBULATORY_CARE_PROVIDER_SITE_OTHER): Payer: Medicare HMO | Admitting: Family Medicine

## 2016-08-30 ENCOUNTER — Encounter: Payer: Self-pay | Admitting: Family Medicine

## 2016-08-30 VITALS — BP 100/64 | HR 94 | Temp 97.9°F | Ht 62.0 in | Wt 140.7 lb

## 2016-08-30 DIAGNOSIS — N898 Other specified noninflammatory disorders of vagina: Secondary | ICD-10-CM

## 2016-08-30 DIAGNOSIS — K219 Gastro-esophageal reflux disease without esophagitis: Secondary | ICD-10-CM | POA: Diagnosis not present

## 2016-08-30 DIAGNOSIS — G8929 Other chronic pain: Secondary | ICD-10-CM

## 2016-08-30 DIAGNOSIS — J302 Other seasonal allergic rhinitis: Secondary | ICD-10-CM

## 2016-08-30 DIAGNOSIS — M549 Dorsalgia, unspecified: Secondary | ICD-10-CM

## 2016-08-30 DIAGNOSIS — N816 Rectocele: Secondary | ICD-10-CM

## 2016-08-30 DIAGNOSIS — M810 Age-related osteoporosis without current pathological fracture: Secondary | ICD-10-CM | POA: Diagnosis not present

## 2016-08-30 DIAGNOSIS — Z7689 Persons encountering health services in other specified circumstances: Secondary | ICD-10-CM

## 2016-08-30 NOTE — Progress Notes (Signed)
HPI:  Beth Wise is here to establish care.  She used to see Dr. Billey Chang. She doesn't really have any new concerns today. She wants to do her annual wellness visit and physical in October. Reports that is when she usually checks her labs.  Past medical history significant for the following chronic issues:  GERD/IBS/collagenous colitis: -Has seen GI in the past -Currently takes Prilosec daily -Reports really hasn't had any bowel or IBS symptoms and a long time -Reports has acid reflux issues if she stops her Prilosec  Chronic back pain: -Sees specialists for this -Reports history of spinal stenosis  Osteoporosis: -On prolia injections for over 4 years -Reports had a bone density last year -wonders if she could stop the prolia wonders about other treatments and how long to consider treatment given her age -reports had bone density test last year -Takes vitamin D and gets adequate calcium  Rectocele: -Sees Dr. Zigmund Daniel for this -Vaginal irritation at times and uses Silvadene -Also has history of yeast infections -Has had a little bit of vaginal irritation for the last few days -No discharge, pus, dysuria, fevers or malaise  Allergic rhinitis: -Takes Claritin for this  Poor sleep: -Cardiologist put her on benzo for this many years ago -She uses the lorazepam a few nights per week, feels like she could do without it is Tylenol works just as well  ROS negative for unless reported above: fevers, unintentional weight loss, hearing or vision loss, chest pain, palpitations, struggling to breath, hemoptysis, melena, hematochezia, hematuria, falls, loc, si, thoughts of self harm  Past Medical History:  Diagnosis Date  . Allergy   . Arthritis   . Back pain   . Candidal esophagitis ([place]) 2010  . Chest pain, atypical   . Collagenous colitis   . Diverticulosis   . GERD (gastroesophageal reflux disease)   . Heart murmur   . Hiatal hernia 2010  . History of fibromyalgia    . Hypercalcemia   . Hyperlipidemia   . Hypertension   . Insomnia   . Osteoporosis   . Vaginal prolapse     Past Surgical History:  Procedure Laterality Date  . ABDOMINAL HYSTERECTOMY    . BREAST BIOPSY  1964   left benign  . CARDIOVASCULAR STRESS TEST  12/03/2007   EF 72%  . CATARACT EXTRACTION W/ INTRAOCULAR LENS  IMPLANT, BILATERAL    . COLONOSCOPY  04/24/2006   diverticulosis  . FLEXIBLE SIGMOIDOSCOPY  07/27/2010   diverticulosis, collagenous colitis  . TONSILLECTOMY AND ADENOIDECTOMY    . UPPER GASTROINTESTINAL ENDOSCOPY  01/29/2008   esophagitis, hiatal hernia, gastritis  . US ECHOCARDIOGRAPHY  10/30/2007    EF 55-60%    Family History  Problem Relation Age of Onset  . Breast cancer Mother   . Stroke Mother   . Colon polyps Brother   . Heart disease Sister   . Heart disease Brother   . Gastric cancer Sister   . Colon cancer Neg Hx   . Diabetes Neg Hx     Social History   Social History  . Marital status: Widowed    Spouse name: N/A  . Number of children: N/A  . Years of education: N/A   Occupational History  . retired    Social History Main Topics  . Smoking status: Never Smoker  . Smokeless tobacco: Never Used  . Alcohol use Yes     Comment: rarely  . Drug use: No  . Sexual activity: No  Other Topics Concern  . None   Social History Narrative  . None     Current Outpatient Prescriptions:  .  acetaminophen (TYLENOL) 325 MG tablet, Take 650 mg by mouth every 6 (six) hours as needed., Disp: , Rfl:  .  BIOTIN PO, Take by mouth., Disp: , Rfl:  .  Cholecalciferol (VITAMIN D) 2000 units CAPS, Take 1 capsule by mouth daily., Disp: , Rfl:  .  CLOBETASOL PROPIONATE EX, Apply topically 2 (two) times daily as needed., Disp: , Rfl:  .  EVENING PRIMROSE OIL PO, Take by mouth., Disp: , Rfl:  .  fexofenadine (ALLEGRA) 180 MG tablet, Take 180 mg by mouth daily., Disp: , Rfl:  .  fluticasone (FLONASE) 50 MCG/ACT nasal spray, Place 1 spray into the nose as  needed. Sinus drainage, Disp: , Rfl:  .  GuaiFENesin (MUCINEX PO), Take 1 tablet by mouth every 12 (twelve) hours as needed (congestion). , Disp: , Rfl:  .  hydrocortisone 2.5 % cream, Apply topically 2 (two) times daily., Disp: , Rfl:  .  ipratropium (ATROVENT) 0.03 % nasal spray, As directed nasal spray, Disp: , Rfl:  .  loperamide (IMODIUM A-D) 2 MG tablet, Take 2 mg by mouth., Disp: , Rfl:  .  omeprazole (PRILOSEC) 20 MG capsule, Take 20 mg by mouth daily as needed (for acid reflux). As needed -pt doesn't take it everyday, Disp: , Rfl:  .  Polyethyl Glycol-Propyl Glycol (SYSTANE OP), Apply to eye., Disp: , Rfl:  .  Pyridoxine HCl (VITAMIN B-6 PO), Take by mouth., Disp: , Rfl:  .  saccharomyces boulardii (FLORASTOR) 250 MG capsule, Take 250 mg by mouth 2 (two) times daily., Disp: , Rfl:  .  silver sulfADIAZINE (SILVADENE) 1 % cream, Apply 1 application topically daily., Disp: , Rfl:  .  Simethicone (GAS-X PO), Take 1 tablet by mouth as needed (for gas). , Disp: , Rfl:  .  traMADol (ULTRAM) 50 MG tablet, Take by mouth 3 (three) times daily as needed (for pain). Reported on 07/06/2015, Disp: , Rfl:   EXAM:  Vitals:   08/30/16 1459  BP: 100/64  Pulse: 94  Temp: 97.9 F (36.6 C)    Body mass index is 25.73 kg/m.  GENERAL: vitals reviewed and listed above, alert, oriented, appears well hydrated and in no acute distress  HEENT: atraumatic, conjunttiva clear, no obvious abnormalities on inspection of external nose and ears  NECK: no obvious masses on inspection  LUNGS: clear to auscultation bilaterally, no wheezes, rales or rhonchi, good air movement  CV: HRRR, no peripheral edema  MS: moves all extremities without noticeable abnormality  PSYCH: pleasant and cooperative, no obvious depression or anxiety  ASSESSMENT AND PLAN:  Discussed the following assessment and plan:  Vaginal irritation Rectocele -symptoms sound like yeast and she can try OTC options while waiting to see  specialist - advised prompt follow up if worsening in interim for pelvic/GU exam  Gastroesophageal reflux disease, esophagitis presence not specified -wants diet recs for this - provided -try every other day PPI if controls symptoms  Chronic back pain, unspecified back location, unspecified back pain laterality - -seeing specialist for spinal stenosis -reports seeing specialist  Seasonal allergic rhinitis, unspecified trigger -cont current tx as neede  Osteoporosis, unspecified osteoporosis type, unspecified pathological fracture presence - Plan: Ambulatory referral to Endocrinology -discussed options, will have her see Dr. Cruzita Lederer  Encounter to establish care -We reviewed the PMH, PSH, FH, SH, Meds and Allergies. -We provided refills for any medications  we will prescribe as needed. -We addressed current concerns per orders and patient instructions. -We have asked for records for pertinent exams, studies, vaccines and notes from previous providers. -We have advised patient to follow up per instructions below.  AWV in October. Offered vaccines today - she wants to check records and with insurance first.  -Patient advised to return or notify a doctor immediately if symptoms worsen or persist or new concerns arise.  Patient Instructions  BEFORE YOU LEAVE: -follow up: AWV with Manuela Schwartz and CPE with Dr. Maudie Mercury in October on the same day; will plan to do labs then  -check on your vaccines with your records and the insurance company - you appear to be due for flu vaccine, prevnar 13 and possibly your tetanus booster.  -We placed a referral for you as discussed. It usually takes about 1-2 weeks to process and schedule this referral. If you have not heard from [place] regarding this appointment in 2 weeks please contact our office.  -For the vaginal irritation try aquaphor with yeast cream (monistat or clotrimazole) applied twice daily. Please see your specialist if any worsening or if symptoms  persist.  It was nice to meet you today!  WE NOW OFFER   [facility] FAST TRACK!!!  SAME DAY Appointments for ACUTE CARE  Such as: Sprains, Injuries, cuts, abrasions, rashes, muscle pain, joint pain, back pain Colds, flu, sore throats, headache, allergies, cough, fever  Ear pain, sinus and eye infections Abdominal pain, nausea, vomiting, diarrhea, upset stomach Animal/insect bites  3 Easy Ways to Schedule: Walk-In Scheduling Call in scheduling Mychart Sign-up: [email or web address]     Food Choices for Gastroesophageal Reflux Disease, Adult When you have gastroesophageal reflux disease (GERD), the foods you eat and your eating habits are very important. Choosing the right foods can help ease the discomfort of GERD. Consider working with a diet and nutrition specialist (dietitian) to help you make healthy food choices. What general guidelines should I follow? Eating plan  Choose healthy foods low in fat, such as fruits, vegetables, whole grains, low-fat dairy products, and lean meat, fish, and poultry.  Eat frequent, small meals instead of three large meals each day. Eat your meals slowly, in a relaxed setting. Avoid bending over or lying down until 2-3 hours after eating.  Limit high-fat foods such as fatty meats or fried foods.  Limit your intake of oils, butter, and shortening to less than 8 teaspoons each day.  Avoid the following: ? Foods that cause symptoms. These may be different for different people. Keep a food diary to keep track of foods that cause symptoms. ? Alcohol. ? Drinking large amounts of liquid with meals. ? Eating meals during the 2-3 hours before bed.  Cook foods using methods other than frying. This may include baking, grilling, or broiling. Lifestyle   Maintain a healthy weight. Ask your health care provider what weight is healthy for you. If you need to lose weight, work with your health care provider to do so  safely.  Exercise for at least 30 minutes on 5 or more days each week, or as told by your health care provider.  Avoid wearing clothes that fit tightly around your waist and chest.  Do not use any products that contain nicotine or tobacco, such as cigarettes and e-cigarettes. If you need help quitting, ask your health care provider.  Sleep with the head of your bed raised. Use a wedge under the mattress or blocks under the bed frame to raise the  head of the bed. What foods are not recommended? The items listed may not be a complete list. Talk with your dietitian about what dietary choices are best for you. Grains Pastries or quick breads with added fat. [place] toast. Vegetables Deep fried vegetables. [place] fries. Any vegetables prepared with added fat. Any vegetables that cause symptoms. For some people this may include tomatoes and tomato products, chili peppers, onions and garlic, and horseradish. Fruits Any fruits prepared with added fat. Any fruits that cause symptoms. For some people this may include citrus fruits, such as oranges, grapefruit, pineapple, and lemons. Meats and other protein foods High-fat meats, such as fatty beef or pork, hot dogs, ribs, ham, sausage, salami and bacon. Fried meat or protein, including fried fish and fried chicken. Nuts and nut butters. Dairy Whole milk and chocolate milk. Sour cream. Cream. Ice cream. Cream cheese. Milk shakes. Beverages Coffee and tea, with or without caffeine. Carbonated beverages. Sodas. Energy drinks. Fruit juice made with acidic fruits (such as orange or grapefruit). Tomato juice. Alcoholic drinks. Fats and oils Butter. Margarine. Shortening. Ghee. Sweets and desserts Chocolate and cocoa. Donuts. Seasoning and other foods Pepper. Peppermint and spearmint. Any condiments, herbs, or seasonings that cause symptoms. For some people, this may include curry, hot sauce, or vinegar-based salad dressings. Summary  When you have  gastroesophageal reflux disease (GERD), food and lifestyle choices are very important to help ease the discomfort of GERD.  Eat frequent, small meals instead of three large meals each day. Eat your meals slowly, in a relaxed setting. Avoid bending over or lying down until 2-3 hours after eating.  Limit high-fat foods such as fatty meat or fried foods. This information is not intended to replace advice given to you by your health care provider. Make sure you discuss any questions you have with your health care provider. Document Released: 12/18/2004 Document Revised: 12/20/2015 Document Reviewed: 12/20/2015 Elsevier Interactive Patient Education  2017 [place], [place]

## 2016-08-30 NOTE — Patient Instructions (Addendum)
BEFORE YOU LEAVE: -follow up: AWV with Manuela Schwartz and CPE with Dr. Maudie Mercury in October on the same day; will plan to do labs then  -check on your vaccines with your records and the insurance company - you appear to be due for flu vaccine, prevnar 13 and possibly your tetanus booster.  -We placed a referral for you as discussed. It usually takes about 1-2 weeks to process and schedule this referral. If you have not heard from [place] regarding this appointment in 2 weeks please contact our office.  -For the vaginal irritation try aquaphor with yeast cream (monistat or clotrimazole) applied twice daily. Please see your specialist if any worsening or if symptoms persist.  It was nice to meet you today!  WE NOW OFFER   [facility] FAST TRACK!!!  SAME DAY Appointments for ACUTE CARE  Such as: Sprains, Injuries, cuts, abrasions, rashes, muscle pain, joint pain, back pain Colds, flu, sore throats, headache, allergies, cough, fever  Ear pain, sinus and eye infections Abdominal pain, nausea, vomiting, diarrhea, upset stomach Animal/insect bites  3 Easy Ways to Schedule: Walk-In Scheduling Call in scheduling Mychart Sign-up: [email or web address]     Food Choices for Gastroesophageal Reflux Disease, Adult When you have gastroesophageal reflux disease (GERD), the foods you eat and your eating habits are very important. Choosing the right foods can help ease the discomfort of GERD. Consider working with a diet and nutrition specialist (dietitian) to help you make healthy food choices. What general guidelines should I follow? Eating plan  Choose healthy foods low in fat, such as fruits, vegetables, whole grains, low-fat dairy products, and lean meat, fish, and poultry.  Eat frequent, small meals instead of three large meals each day. Eat your meals slowly, in a relaxed setting. Avoid bending over or lying down until 2-3 hours after eating.  Limit high-fat foods such as fatty meats or  fried foods.  Limit your intake of oils, butter, and shortening to less than 8 teaspoons each day.  Avoid the following: ? Foods that cause symptoms. These may be different for different people. Keep a food diary to keep track of foods that cause symptoms. ? Alcohol. ? Drinking large amounts of liquid with meals. ? Eating meals during the 2-3 hours before bed.  Cook foods using methods other than frying. This may include baking, grilling, or broiling. Lifestyle   Maintain a healthy weight. Ask your health care provider what weight is healthy for you. If you need to lose weight, work with your health care provider to do so safely.  Exercise for at least 30 minutes on 5 or more days each week, or as told by your health care provider.  Avoid wearing clothes that fit tightly around your waist and chest.  Do not use any products that contain nicotine or tobacco, such as cigarettes and e-cigarettes. If you need help quitting, ask your health care provider.  Sleep with the head of your bed raised. Use a wedge under the mattress or blocks under the bed frame to raise the head of the bed. What foods are not recommended? The items listed may not be a complete list. Talk with your dietitian about what dietary choices are best for you. Grains Pastries or quick breads with added fat. [place] toast. Vegetables Deep fried vegetables. [place] fries. Any vegetables prepared with added fat. Any vegetables that cause symptoms. For some people this may include tomatoes and tomato products, chili peppers, onions and garlic, and horseradish. Fruits Any fruits prepared with added  fat. Any fruits that cause symptoms. For some people this may include citrus fruits, such as oranges, grapefruit, pineapple, and lemons. Meats and other protein foods High-fat meats, such as fatty beef or pork, hot dogs, ribs, ham, sausage, salami and bacon. Fried meat or protein, including fried fish and fried chicken. Nuts and nut  butters. Dairy Whole milk and chocolate milk. Sour cream. Cream. Ice cream. Cream cheese. Milk shakes. Beverages Coffee and tea, with or without caffeine. Carbonated beverages. Sodas. Energy drinks. Fruit juice made with acidic fruits (such as orange or grapefruit). Tomato juice. Alcoholic drinks. Fats and oils Butter. Margarine. Shortening. Ghee. Sweets and desserts Chocolate and cocoa. Donuts. Seasoning and other foods Pepper. Peppermint and spearmint. Any condiments, herbs, or seasonings that cause symptoms. For some people, this may include curry, hot sauce, or vinegar-based salad dressings. Summary  When you have gastroesophageal reflux disease (GERD), food and lifestyle choices are very important to help ease the discomfort of GERD.  Eat frequent, small meals instead of three large meals each day. Eat your meals slowly, in a relaxed setting. Avoid bending over or lying down until 2-3 hours after eating.  Limit high-fat foods such as fatty meat or fried foods. This information is not intended to replace advice given to you by your health care provider. Make sure you discuss any questions you have with your health care provider. Document Released: 12/18/2004 Document Revised: 12/20/2015 Document Reviewed: 12/20/2015 Elsevier Interactive Patient Education  2017 [employer].

## 2016-09-05 DIAGNOSIS — Z1231 Encounter for screening mammogram for malignant neoplasm of breast: Secondary | ICD-10-CM | POA: Diagnosis not present

## 2016-09-05 LAB — HM MAMMOGRAPHY

## 2016-09-12 DIAGNOSIS — N816 Rectocele: Secondary | ICD-10-CM | POA: Diagnosis not present

## 2016-09-12 DIAGNOSIS — N393 Stress incontinence (female) (male): Secondary | ICD-10-CM | POA: Diagnosis not present

## 2016-09-12 DIAGNOSIS — N8110 Cystocele, unspecified: Secondary | ICD-10-CM | POA: Diagnosis not present

## 2016-09-12 DIAGNOSIS — N952 Postmenopausal atrophic vaginitis: Secondary | ICD-10-CM | POA: Diagnosis not present

## 2016-09-14 ENCOUNTER — Encounter: Payer: Self-pay | Admitting: Family Medicine

## 2016-09-26 ENCOUNTER — Ambulatory Visit: Payer: Medicare HMO

## 2016-10-04 ENCOUNTER — Encounter: Payer: Medicare HMO | Admitting: Family Medicine

## 2016-10-22 DIAGNOSIS — R69 Illness, unspecified: Secondary | ICD-10-CM | POA: Diagnosis not present

## 2016-10-24 DIAGNOSIS — N393 Stress incontinence (female) (male): Secondary | ICD-10-CM | POA: Diagnosis not present

## 2016-10-24 DIAGNOSIS — N816 Rectocele: Secondary | ICD-10-CM | POA: Diagnosis not present

## 2016-10-24 DIAGNOSIS — K469 Unspecified abdominal hernia without obstruction or gangrene: Secondary | ICD-10-CM | POA: Diagnosis not present

## 2016-10-24 DIAGNOSIS — N952 Postmenopausal atrophic vaginitis: Secondary | ICD-10-CM | POA: Diagnosis not present

## 2016-10-30 ENCOUNTER — Encounter: Payer: Medicare HMO | Admitting: Family Medicine

## 2016-11-02 ENCOUNTER — Ambulatory Visit: Payer: Medicare HMO | Admitting: Internal Medicine

## 2016-11-07 ENCOUNTER — Encounter: Payer: Self-pay | Admitting: Internal Medicine

## 2016-11-07 ENCOUNTER — Non-Acute Institutional Stay: Payer: Medicare HMO | Admitting: Internal Medicine

## 2016-11-07 VITALS — BP 110/70 | HR 66 | Temp 98.3°F | Ht 62.0 in | Wt 139.0 lb

## 2016-11-07 DIAGNOSIS — M810 Age-related osteoporosis without current pathological fracture: Secondary | ICD-10-CM | POA: Diagnosis not present

## 2016-11-07 DIAGNOSIS — N816 Rectocele: Secondary | ICD-10-CM

## 2016-11-07 DIAGNOSIS — M48061 Spinal stenosis, lumbar region without neurogenic claudication: Secondary | ICD-10-CM

## 2016-11-07 DIAGNOSIS — R682 Dry mouth, unspecified: Secondary | ICD-10-CM

## 2016-11-07 DIAGNOSIS — K52831 Collagenous colitis: Secondary | ICD-10-CM

## 2016-11-07 DIAGNOSIS — M4135 Thoracogenic scoliosis, thoracolumbar region: Secondary | ICD-10-CM | POA: Diagnosis not present

## 2016-11-07 DIAGNOSIS — F5102 Adjustment insomnia: Secondary | ICD-10-CM | POA: Diagnosis not present

## 2016-11-07 DIAGNOSIS — I5032 Chronic diastolic (congestive) heart failure: Secondary | ICD-10-CM

## 2016-11-07 DIAGNOSIS — N952 Postmenopausal atrophic vaginitis: Secondary | ICD-10-CM

## 2016-11-07 DIAGNOSIS — Z23 Encounter for immunization: Secondary | ICD-10-CM | POA: Diagnosis not present

## 2016-11-07 DIAGNOSIS — I10 Essential (primary) hypertension: Secondary | ICD-10-CM

## 2016-11-07 DIAGNOSIS — N189 Chronic kidney disease, unspecified: Secondary | ICD-10-CM | POA: Diagnosis not present

## 2016-11-07 DIAGNOSIS — R69 Illness, unspecified: Secondary | ICD-10-CM | POA: Diagnosis not present

## 2016-11-07 NOTE — Progress Notes (Addendum)
Provider:  Rexene Edison. Mariea Clonts, D.O., C.M.D. Location:  Artist of Service:  Clinic (12)  Previous PCP: Gayland Curry, DO Patient Care Team: Gayland Curry, DO as PCP - General (Geriatric Medicine) Gayland Curry, DO as PCP - Internal Medicine (Geriatric Medicine) Rolm Bookbinder, MD as Consulting Physician (Dermatology) Marti Sleigh, MD as Consulting Physician (Gynecology) Stanford Breed Denice Bors, MD as Consulting Physician (Cardiology) Luberta Mutter, MD as Consulting Physician (Ophthalmology) Jola Baptist, [place] as Referring Physician (Chiropractic Medicine)  Extended Emergency Contact Information Primary Emergency Contact: Turlington,Lanelle Address: [address]          [place], [place] [address] Johnnette Litter of [place] Phone: [phone] Relation: Daughter Secondary Emergency Contact: Turlington,Ed  [place] Phone: [phone] Mobile Phone: [phone] Relation: Son  Code Status: at beginning of visit:  full code, MOST form for attempt resuscitation, ICU ok, fluids and tube feeding short term, abx if life can be prolonged; however, we discussed and she does not want CPR, tube feeding, Is ok with only short term abx and fluids if her condition can be easily treated and resolved.    Goals of Care: Advanced Directive information--has living will and hcpoa also Advanced Directives 11/07/2016  Does Patient Have a Medical Advance Directive? Yes  Type of Paramedic of [place];Living will  Copy of Reader in Chart? No - copy requested  Pre-existing out of facility DNR order (yellow form or pink MOST form) Pink MOST form placed in chart (order not valid for inpatient use)    Chief Complaint  Patient presents with  . Establish Care    new patient    HPI: Patient is a 83 y.o. female seen today to establish with [facility]  Records have been requested from  her previous PCP.  Was seeing Donnetta Hutching and recently, Colin Benton, DO on 8/30.  Has been here at [facility] for [facility].  She brought her updated link to life.    Has been on her sleep medicine for a long time with dalmane prescribed by Dr. Mare Ferrari.  Takes benzo and not used but 3 times per week.  Discussed Beer's criteria with her and risks of benzos.    She has been taking prolia for osteoporosis.  Bone density not due until Sept of 2019.  Next due in November.  Saw Dr. Jonni Sanger and got the prolia there in May 2019.  Thinks she's been on prolia 4-5 yrs.  Had osteopenia at first.  Says the last time, she did have osteoporosis.  Did not tolerate actonel.    Had been seeing Dr. Philip Aspen before these other doctors.  Liked him, but a far drive.    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  Retired when she had her first grandchild.   HTN:  bp has been at goal.    Collagenous colitis:  Had seen Dr. Olevia Perches in the past.  Lost a lot of weight.  When she finally got over it.  Prednisone finally cured her. Then it recurred.  She went to a "wellness" doctor and she was cured after 3 years.  They thought perhaps she actually had IBS.  Used fresh ginger drink, cottage cheese, pineapple and has probiotic on yogurt.  Not much diarrhea since 2014.  Cured with particulants per pt.    Recently, has had back pain. Can't hold out to walk.  Was told by [facility] and was told  she has spinal stenosis, scoliosis, osteoarthritis.  Aleve helps her the best.  Tylenol does not help that much.  Taking 1 tramadol with two tylenol at lunch--lots of times, she can go to supper time with relief.  Today, she didn't get a chance to rest, had her lunch in her apt.  Suddenly her upper and lower back get the best of her.  Lying down on the heating pad helps a lot.  Only began 3 years ago.    She had low kidney function 2 years ago with Dr. Philip Aspen and that's when she was taken off aleve and tramadol was started.  Rarely takes it.   Need labs to know severity.  A week ago today, her right leg hurt below the knee to the ankle.  Has a terrible varicose vein.  Not a cramp or charlie horse.  Has had an ultrasound of the leg.  Dr. Mare Ferrari had been PCP at that time.  No blood clot then.  Pain only lasted this last time last week for 15 mins.    Other pain is right under her right shoulder blade since last Thursday.  It came to stay. Goes away short times.  Used pain medicine for it some.  Spine goes to the left and she says it's noticeable in her clothes.    Dry mouth:  Biotene helps, but terribly bothersome at night even with the biotene.  Brings a bottle of water to her bedside (gets up 1 time at least)--swishes with the water.  Doesn't know if she breathes thru her mouth.  She does not snore.  Makes some noise.  Discussed allegra, tramadol and dalmane may cause this.  Asks about Sjogren's.   She goes to a Restaurant manager, fast food.  Also has been to massage therapy, too.    Had shingles at her waistline in 1975--no chronic pain.  Is on the to be called list with the shingrix.   Took a steroid taper and didn't even miss work.  Did hurt to drive over the railroad tracks.   Sees Dr. Zigmund Daniel for her rectocele.  Thought she might need surgery after being bothered all summer.  Had atrophic vaginitis also.  Uses estrace cream.  Uses skirts and wears looser underwear now.  Insurance not covering her estrace compound.  She also recommended crisco which helps her in the daytime.    Got her flu shot at the [facility] [address] Fluzone high dose.    She is part of the health care committee here.  She does not think she'd be revived soon enough if she needed CPR.  Clinic nurse mentioned broken ribs and bleeding internally.  She does not want any kind of artificial life.  Does not want to be on a feeding tube to survive.       Past Medical History:  Diagnosis Date  . Allergy   . Arthritis   . Back pain   . Candidal esophagitis  ([place]) 2010  . Chest pain, atypical   . Collagenous colitis    Dr. Maurene Capes  . Diverticulosis   . GERD (gastroesophageal reflux disease)   . Heart murmur   . Hiatal hernia 2010  . History of fibromyalgia   . Hypercalcemia   . Hyperlipidemia   . Hypertension   . IBS (irritable bowel syndrome)   . Insomnia   . Osteoporosis   . Rectocele   . Scoliosis   . Shingles 1975   at waistline, no chronic pain.    Marland Kitchen  Spinal stenosis   . Vaginal prolapse    Past Surgical History:  Procedure Laterality Date  . ABDOMINAL HYSTERECTOMY    . BREAST BIOPSY  1964   left benign  . CARDIOVASCULAR STRESS TEST  12/03/2007   EF 72%  . CATARACT EXTRACTION W/ INTRAOCULAR LENS  IMPLANT, BILATERAL    . COLONOSCOPY  04/24/2006   diverticulosis  . FLEXIBLE SIGMOIDOSCOPY  07/27/2010   diverticulosis, collagenous colitis  . TONSILLECTOMY AND ADENOIDECTOMY    . UPPER GASTROINTESTINAL ENDOSCOPY  01/29/2008   esophagitis, hiatal hernia, gastritis  . US ECHOCARDIOGRAPHY  10/30/2007    EF 55-60%    Social History   Socioeconomic History  . Marital status: Widowed    Spouse name: None  . Number of children: 2  . Years of education: None  . Highest education level: None  Social Needs  . Financial resource strain: None  . Food insecurity - worry: None  . Food insecurity - inability: None  . Transportation needs - medical: None  . Transportation needs - non-medical: None  Occupational History  . Occupation: retired    Comment: owned Human resources officer  Tobacco Use  . Smoking status: Never Smoker  . Smokeless tobacco: Never Used  Substance and Sexual Activity  . Alcohol use: Yes    Comment: rarely  . Drug use: No  . Sexual activity: No  Other Topics Concern  . None  Social History Narrative   Social History      Diet? Regular (shouldn't have excessive amts of acid, hot &spicey foods)      Do you drink/eat things with caffeine? yes      Marital status?       widow                             What year  were you married? [place] you live in a house, apartment, assisted living, condo, trailer, etc.? apartment      Is it one or more stories? 1      How many persons live in your home? 1 (self)      Do you have any pets in your home? (please list) no no !!      Highest level of education completed?      Current or past profession: hairdresser      Do you exercise?      little                                Type & how often? Leg lifts and plank, stretching every day.      Advanced Directives      Do you have a living will?  yes      Do you have a DNR form?         yes                         If not, do you want to discuss one? yes      Do you have signed POA/HPOA for forms? yes      Functional Status      Do you have difficulty bathing or dressing yourself?      Do you have difficulty preparing food or eating?       Do you have difficulty managing your medications?  Do you have difficulty managing your finances?      Do you have difficulty affording your medications?    reports that  has never smoked. she has never used smokeless tobacco. She reports that she drinks alcohol. She reports that she does not use drugs.  Functional Status Survey:    Family History  Problem Relation Age of Onset  . Breast cancer Mother        died at 60  . Stroke Mother   . Heart disease Father        died at 39  . Cirrhosis Sister        died at 19  . Heart disease Brother        died at 62  . Pneumonia Sister        died at 15 months  . Heart disease Sister        died at 7  . Cancer - Other Sister        stomach; died at 61  . Heart disease Brother        c-diff and sepsis; died at 45  . Colitis Sister   . Colon cancer Neg Hx   . Diabetes Neg Hx     Health Maintenance  Topic Date Due  . MAMMOGRAM  09/05/2017  . TETANUS/TDAP  01/02/2020  . INFLUENZA VACCINE  Completed  . DEXA SCAN  Completed  . PNA vac Low Risk Adult  Completed    Allergies  Allergen  Reactions  . Actonel [Risedronate Sodium] Other (See Comments)    Joint pain  . Amoxicillin Diarrhea  . Erythromycin Other (See Comments)    Gi upset  . Lipitor [Atorvastatin Calcium]   . Zetia [Ezetimibe]     Joint pain    Outpatient Encounter Medications as of 11/07/2016  Medication Sig  . acetaminophen (TYLENOL) 325 MG tablet Take 650 mg by mouth every 6 (six) hours as needed.  Marland Kitchen BIOTIN PO Take 5,000 mcg by mouth daily.   . Calcium 250 MG CAPS Take 1 capsule by mouth 3 (three) times daily.  . Cholecalciferol (VITAMIN D) 2000 units CAPS Take 1 capsule by mouth. 3 times per week.  . clobetasol cream (TEMOVATE) 0.25 % Apply 1 application topically daily as needed. Apply to scalp as needed.  Marland Kitchen estradiol (ESTRACE) 0.1 MG/GM vaginal cream Place 1 Applicatorful vaginally at bedtime.  Marland Kitchen EVENING PRIMROSE OIL PO Take 1,500 mg by mouth 3 (three) times daily.   . fexofenadine (ALLEGRA) 180 MG tablet Take 180 mg by mouth daily.  . flurazepam (DALMANE) 15 MG capsule Take 15 mg by mouth. 2-3 times a week.  . fluticasone (FLONASE) 50 MCG/ACT nasal spray Place 1 spray into the nose as needed. Sinus drainage  . GuaiFENesin (MUCINEX PO) Take 1 tablet by mouth every 12 (twelve) hours as needed (congestion).   Marland Kitchen ipratropium (ATROVENT) 0.03 % nasal spray As directed nasal spray  . naproxen sodium (ANAPROX) 220 MG tablet Take 220 mg daily as needed by mouth.   . neomycin-polymyxin-hydrocortisone (CORTISPORIN) 3.5-10000-0.5 cream Apply to lids daily as needed.  Marland Kitchen omeprazole (PRILOSEC) 20 MG capsule Take 20 mg daily as needed by mouth (for acid reflux).   [name] Glycol-Propyl Glycol (SYSTANE OP) Apply to eye.  . traMADol (ULTRAM) 50 MG tablet Take 50 mg by mouth 2 (two) times daily as needed (for pain). Reported on 07/06/2015   No facility-administered encounter medications on file as of 11/07/2016.     Review of Systems  Constitutional: Negative for chills and fever.  HENT: Negative for hearing loss.    Eyes:       Dry eyes  Respiratory: Negative for cough and shortness of breath.   Gastrointestinal: Positive for heartburn. Negative for abdominal pain, blood in stool, constipation and melena.       Hiatal hernia  Musculoskeletal: Positive for back pain.       Osteoporosis  Skin: Negative for itching and rash.  Neurological: Negative for dizziness and loss of consciousness.  Endo/Heme/Allergies: Positive for environmental allergies. Bruises/bleeds easily.    Vitals:   11/07/16 1436  BP: 110/70  Pulse: 66  Temp: 98.3 F (36.8 C)  TempSrc: Oral  SpO2: 96%  Weight: 139 lb (63 kg)  Height: 5\' 2"  (1.575 m)   Body mass index is 25.42 kg/m. Physical Exam  Constitutional: She is oriented to person, place, and time. She appears well-developed and well-nourished. No distress.  HENT:  Head: Normocephalic and atraumatic.  Right Ear: External ear normal.  Left Ear: External ear normal.  Nose: Nose normal.  Mouth/Throat: Oropharynx is clear and moist. No oropharyngeal exudate.  Eyes: Conjunctivae and EOM are normal. Pupils are equal, round, and reactive to light.  glasses  Neck: Neck supple. No JVD present. No tracheal deviation present. No thyromegaly present.  Cardiovascular: Normal rate, regular rhythm and intact distal pulses.  Murmur heard. Systolic murmur  Pulmonary/Chest: Effort normal and breath sounds normal. No stridor. No respiratory distress. She has no wheezes. She has no rales.  Abdominal: Soft. Bowel sounds are normal. She exhibits no distension and no mass. There is no tenderness. There is no rebound and no guarding. No hernia.  Musculoskeletal: Normal range of motion.  Thoracolumbar scoliosis, tenderness over right shoulder blade in ribs; no tenderness of right leg on exam  Lymphadenopathy:    She has no cervical adenopathy.  Neurological: She is alert and oriented to person, place, and time. She displays normal reflexes. No cranial nerve deficit or sensory deficit.  She exhibits normal muscle tone. Coordination normal.  Skin: Skin is warm and dry. Capillary refill takes less than 2 seconds. She is not diaphoretic. There is pallor.  Psychiatric: She has a normal mood and affect.  Very friendly and talkative    Labs reviewed: Basic Metabolic Panel: Recent Labs    11/13/16 0300  NA 139  K 5.1  BUN 20  CREATININE 0.9   Liver Function Tests: Recent Labs    11/13/16 0300  AST 20  ALT 11  ALKPHOS 34   No results for input(s): LIPASE, AMYLASE in the last 8760 hours. No results for input(s): AMMONIA in the last 8760 hours. CBC: Recent Labs    11/13/16 0300  WBC 5.2  HGB 11.2*  HCT 34*  PLT 216   Cardiac Enzymes: No results for input(s): CKTOTAL, CKMB, CKMBINDEX, TROPONINI in the last 8760 hours. BNP: Invalid input(s): POCBNP No results found for: HGBA1C No results found for: TSH Lab Results  Component Value Date   VITAMINB12 500 10/29/2008   No results found for: FOLATE Lab Results  Component Value Date   IRON 58 10/29/2008    Imaging and Procedures noted on new patient packet: Flex sig with biopsy Dr. Olevia Perches 11/23/2008:  Mild diverticulosis, biopsies done to r/o pseudomembranous colitis which showed collagenous colitis (colon and rectum) EGD 2017 Dr. Carlean Purl, [facility]--gastritis--mild inflammation on bxs; refused cscope at that point for heme positive stools as she felt better per notes on EGD surg path Bone density  Dr. Philip Aspen 2017 T score -2.6 for right femoral neck Mammogram 2018 Dr. Maudie Mercury, [facility], normal Echo 2018 Dr. Stanford Breed:  EF 55-60%, mild LVH, grade 1 diastolic dysfunction, mild aortic regurg, mild to moderate mitral regurg, mild LA dilatation, mild tricuspid regurg, normal systolic PA presure with peak 75mmHg  Assessment/Plan 1. Senile osteoporosis -has been on prolia, last bone density with OP of hip, arrange to continue prolia here and continue on calcium and Vit D3 supplements  2. Insomnia due to psychological  stress -ongoing, discussed risks to cognition and falls with use of flurazepam for anxiety and insomnia, does not use nightly--she opted to continue due to years of use and effectiveness  3. Benign essential hypertension -bp at goal without current meds  4. Collagenous colitis -historically, required steroids and then said to resolve with help of herbalist  5. Spinal stenosis of lumbar region without neurogenic claudication -back pain due to this as well as her scoliosis, cont use of tylenol--expressed preference to her for this over aleve due to GI bleed risks (h/o heme positive stool in the past and gastritis and CKD), not using aleve daily and does take with food; uses tramadol when pain severe only  6. Chronic kidney disease, unspecified CKD stage -Avoid nephrotoxic agents like nsaids, dose adjust renally excreted meds, hydrate.  7. Thoracogenic scoliosis of thoracolumbar region -seems this is reason for her pain in her shoulder blade, counseled on use of heat and topicals to help this (no heat directly over wet topicals due to burn risk) -if not getting relief, consider PT, massage or chiropractic help  8. Dry mouth -unclear if she has some sjogren's (also has dry eyes) or if this is due to her medications as several can cause--discussed that with her -cont biotene, lozenges, hydration  9. Rectocele -followed by gyn, does not want surgery  10. Postmenopause atrophic vaginitis -cont use of crisco in days, estrace at hs  11. Chronic diastolic CHF (congestive heart failure), NYHA class 1 (HCC) -asymptomatic, noted on echo, monitor  12. Need for vaccination with 13-polyvalent pneumococcal conjugate vaccine - Pneumococcal conjugate vaccine 13-valent given  Labs/tests ordered:cbc, cmp, flp next draw--will call if something needs to be changed F/u 4 mos med mgt, asked C3 to contact her to schedule AWV   Tyliek Timberman L. Kealani Leckey, D.O. [facility] [address], [place] [address] Cell Phone (Mon-Fri 8am-5pm):  [phone] On Call:  [phone] & follow prompts after 5pm & weekends Office Phone:  [phone] Office Fax:  [phone]

## 2016-11-08 ENCOUNTER — Encounter: Payer: Self-pay | Admitting: *Deleted

## 2016-11-13 ENCOUNTER — Encounter: Payer: Self-pay | Admitting: Internal Medicine

## 2016-11-13 DIAGNOSIS — E785 Hyperlipidemia, unspecified: Secondary | ICD-10-CM | POA: Diagnosis not present

## 2016-11-13 DIAGNOSIS — I10 Essential (primary) hypertension: Secondary | ICD-10-CM | POA: Diagnosis not present

## 2016-11-13 DIAGNOSIS — D649 Anemia, unspecified: Secondary | ICD-10-CM | POA: Diagnosis not present

## 2016-11-13 DIAGNOSIS — Z79899 Other long term (current) drug therapy: Secondary | ICD-10-CM | POA: Diagnosis not present

## 2016-11-13 LAB — BASIC METABOLIC PANEL
BUN: 20 (ref 4–21)
Creatinine: 0.9 (ref 0.5–1.1)
Glucose: 98
Potassium: 5.1 (ref 3.4–5.3)
Sodium: 139 (ref 137–147)

## 2016-11-13 LAB — HEPATIC FUNCTION PANEL
ALT: 11 (ref 7–35)
AST: 20 (ref 13–35)
Alkaline Phosphatase: 34 (ref 25–125)
Bilirubin, Total: 0.3

## 2016-11-13 LAB — CBC AND DIFFERENTIAL
HCT: 34 — AB (ref 36–46)
Hemoglobin: 11.2 — AB (ref 12.0–16.0)
Platelets: 216 (ref 150–399)
WBC: 5.2

## 2016-11-13 LAB — LIPID PANEL
Cholesterol: 205 — AB (ref 0–200)
HDL: 74 — AB (ref 35–70)
LDL Cholesterol: 111
Triglycerides: 101 (ref 40–160)

## 2016-11-20 ENCOUNTER — Telehealth: Payer: Self-pay | Admitting: *Deleted

## 2016-11-20 NOTE — Telephone Encounter (Signed)
Pt calling asking for results from her labs, please advise  (im sure they where sent to scanning)

## 2016-11-20 NOTE — Telephone Encounter (Signed)
Labs were sent to patient by CMA.  There were no gross abnormalities. Pt needs wellness visit scheduled with Beth Wise at [facility].

## 2016-11-26 ENCOUNTER — Encounter: Payer: Self-pay | Admitting: Internal Medicine

## 2016-11-26 DIAGNOSIS — N189 Chronic kidney disease, unspecified: Secondary | ICD-10-CM | POA: Insufficient documentation

## 2016-11-26 DIAGNOSIS — I5032 Chronic diastolic (congestive) heart failure: Secondary | ICD-10-CM | POA: Insufficient documentation

## 2016-11-26 DIAGNOSIS — M48061 Spinal stenosis, lumbar region without neurogenic claudication: Secondary | ICD-10-CM | POA: Insufficient documentation

## 2016-11-26 DIAGNOSIS — R682 Dry mouth, unspecified: Secondary | ICD-10-CM | POA: Insufficient documentation

## 2016-11-26 DIAGNOSIS — M4135 Thoracogenic scoliosis, thoracolumbar region: Secondary | ICD-10-CM | POA: Insufficient documentation

## 2016-11-26 DIAGNOSIS — F5101 Primary insomnia: Secondary | ICD-10-CM | POA: Insufficient documentation

## 2016-11-26 DIAGNOSIS — N952 Postmenopausal atrophic vaginitis: Secondary | ICD-10-CM | POA: Insufficient documentation

## 2016-11-26 DIAGNOSIS — I10 Essential (primary) hypertension: Secondary | ICD-10-CM | POA: Insufficient documentation

## 2016-11-26 DIAGNOSIS — M810 Age-related osteoporosis without current pathological fracture: Secondary | ICD-10-CM | POA: Insufficient documentation

## 2016-11-27 ENCOUNTER — Non-Acute Institutional Stay: Payer: Medicare HMO

## 2016-11-27 VITALS — BP 142/68 | HR 90 | Temp 97.5°F | Ht 62.0 in | Wt 139.0 lb

## 2016-11-27 DIAGNOSIS — Z0000 Encounter for general adult medical examination without abnormal findings: Secondary | ICD-10-CM

## 2016-11-27 NOTE — Patient Instructions (Signed)
[name] , Thank you for taking time to come for your Medicare Wellness Visit. I appreciate your ongoing commitment to your health goals. Please review the following plan we discussed and let me know if I can assist you in the future.   Screening recommendations/referrals: Colonoscopy excluded, pt is over age 81 Mammogram excluded, pt is over age 31 Bone Density up to date Recommended yearly ophthalmology/optometry visit for glaucoma screening and checkup Recommended yearly dental visit for hygiene and checkup  Vaccinations: Influenza vaccine up to date. Due 10/22/2017 Pneumococcal vaccine up to date Tdap vaccine up to date. Due 01/02/2020 Shingles vaccine due, on waiting list.  Advanced directives: In Chart  Conditions/risks identified: None  Next appointment: Dr. Mariea Clonts 03/06/2017 @ 3pm   Preventive Care 65 Years and Older, Female Preventive care refers to lifestyle choices and visits with your health care provider that can promote health and wellness. What does preventive care include?  A yearly physical exam. This is also called an annual well check.  Dental exams once or twice a year.  Routine eye exams. Ask your health care provider how often you should have your eyes checked.  Personal lifestyle choices, including:  Daily care of your teeth and gums.  Regular physical activity.  Eating a healthy diet.  Avoiding tobacco and drug use.  Limiting alcohol use.  Practicing safe sex.  Taking low-dose aspirin every day.  Taking vitamin and mineral supplements as recommended by your health care provider. What happens during an annual well check? The services and screenings done by your health care provider during your annual well check will depend on your age, overall health, lifestyle risk factors, and family history of disease. Counseling  Your health care provider may ask you questions about your:  Alcohol use.  Tobacco use.  Drug use.  Emotional  well-being.  Home and relationship well-being.  Sexual activity.  Eating habits.  History of falls.  Memory and ability to understand (cognition).  Work and work Statistician.  Reproductive health. Screening  You may have the following tests or measurements:  Height, weight, and BMI.  Blood pressure.  Lipid and cholesterol levels. These may be checked every 5 years, or more frequently if you are over 46 years old.  Skin check.  Lung cancer screening. You may have this screening every year starting at age 81 if you have a 30-pack-year history of smoking and currently smoke or have quit within the past 15 years.  Fecal occult blood test (FOBT) of the stool. You may have this test every year starting at age 57.  Flexible sigmoidoscopy or colonoscopy. You may have a sigmoidoscopy every 5 years or a colonoscopy every 10 years starting at age 34.  Hepatitis C blood test.  Hepatitis B blood test.  Sexually transmitted disease (STD) testing.  Diabetes screening. This is done by checking your blood sugar (glucose) after you have not eaten for a while (fasting). You may have this done every 1-3 years.  Bone density scan. This is done to screen for osteoporosis. You may have this done starting at age 48.  Mammogram. This may be done every 1-2 years. Talk to your health care provider about how often you should have regular mammograms. Talk with your health care provider about your test results, treatment options, and if necessary, the need for more tests. Vaccines  Your health care provider may recommend certain vaccines, such as:  Influenza vaccine. This is recommended every year.  Tetanus, diphtheria, and acellular pertussis (Tdap,  Td) vaccine. You may need a Td booster every 10 years.  Zoster vaccine. You may need this after age 20.  Pneumococcal 13-valent conjugate (PCV13) vaccine. One dose is recommended after age 70.  Pneumococcal polysaccharide (PPSV23) vaccine. One  dose is recommended after age 52. Talk to your health care provider about which screenings and vaccines you need and how often you need them. This information is not intended to replace advice given to you by your health care provider. Make sure you discuss any questions you have with your health care provider. Document Released: 01/14/2015 Document Revised: 09/07/2015 Document Reviewed: 10/19/2014 Elsevier Interactive Patient Education  2017 [employer] Prevention in the Home Falls can cause injuries. They can happen to people of all ages. There are many things you can do to make your home safe and to help prevent falls. What can I do on the outside of my home?  Regularly fix the edges of walkways and driveways and fix any cracks.  Remove anything that might make you trip as you walk through a door, such as a raised step or threshold.  Trim any bushes or trees on the path to your home.  Use bright outdoor lighting.  Clear any walking paths of anything that might make someone trip, such as rocks or tools.  Regularly check to see if handrails are loose or broken. Make sure that both sides of any steps have handrails.  Any raised decks and porches should have guardrails on the edges.  Have any leaves, snow, or ice cleared regularly.  Use sand or salt on walking paths during winter.  Clean up any spills in your garage right away. This includes oil or grease spills. What can I do in the bathroom?  Use night lights.  Install grab bars by the toilet and in the tub and shower. Do not use towel bars as grab bars.  Use non-skid mats or decals in the tub or shower.  If you need to sit down in the shower, use a plastic, non-slip stool.  Keep the floor dry. Clean up any water that spills on the floor as soon as it happens.  Remove soap buildup in the tub or shower regularly.  Attach bath mats securely with double-sided non-slip rug tape.  Do not have throw rugs and other  things on the floor that can make you trip. What can I do in the bedroom?  Use night lights.  Make sure that you have a light by your bed that is easy to reach.  Do not use any sheets or blankets that are too big for your bed. They should not hang down onto the floor.  Have a firm chair that has side arms. You can use this for support while you get dressed.  Do not have throw rugs and other things on the floor that can make you trip. What can I do in the kitchen?  Clean up any spills right away.  Avoid walking on wet floors.  Keep items that you use a lot in easy-to-reach places.  If you need to reach something above you, use a strong step stool that has a grab bar.  Keep electrical cords out of the way.  Do not use floor polish or wax that makes floors slippery. If you must use wax, use non-skid floor wax.  Do not have throw rugs and other things on the floor that can make you trip. What can I do with my stairs?  Do not  leave any items on the stairs.  Make sure that there are handrails on both sides of the stairs and use them. Fix handrails that are broken or loose. Make sure that handrails are as long as the stairways.  Check any carpeting to make sure that it is firmly attached to the stairs. Fix any carpet that is loose or worn.  Avoid having throw rugs at the top or bottom of the stairs. If you do have throw rugs, attach them to the floor with carpet tape.  Make sure that you have a light switch at the top of the stairs and the bottom of the stairs. If you do not have them, ask someone to add them for you. What else can I do to help prevent falls?  Wear shoes that:  Do not have high heels.  Have rubber bottoms.  Are comfortable and fit you well.  Are closed at the toe. Do not wear sandals.  If you use a stepladder:  Make sure that it is fully opened. Do not climb a closed stepladder.  Make sure that both sides of the stepladder are locked into place.  Ask  someone to hold it for you, if possible.  Clearly mark and make sure that you can see:  Any grab bars or handrails.  First and last steps.  Where the edge of each step is.  Use tools that help you move around (mobility aids) if they are needed. These include:  Canes.  Walkers.  Scooters.  Crutches.  Turn on the lights when you go into a dark area. Replace any light bulbs as soon as they burn out.  Set up your furniture so you have a clear path. Avoid moving your furniture around.  If any of your floors are uneven, fix them.  If there are any pets around you, be aware of where they are.  Review your medicines with your doctor. Some medicines can make you feel dizzy. This can increase your chance of falling. Ask your doctor what other things that you can do to help prevent falls. This information is not intended to replace advice given to you by your health care provider. Make sure you discuss any questions you have with your health care provider. Document Released: 10/14/2008 Document Revised: 05/26/2015 Document Reviewed: 01/22/2014 Elsevier Interactive Patient Education  2017 [employer].

## 2016-11-27 NOTE — Progress Notes (Signed)
Subjective:   Beth Wise is a 83 y.o. female who presents for an Initial Medicare Annual Wellness Visit at [facility]        Objective:    Today's Vitals   11/27/16 1402  BP: (!) 142/68  Pulse: 90  Temp: (!) 97.5 F (36.4 C)  TempSrc: Oral  SpO2: 98%  Weight: 139 lb (63 kg)  Height: 5\' 2"  (1.575 m)   Body mass index is 25.42 kg/m.   Current Medications (verified) Outpatient Encounter Medications as of 11/27/2016  Medication Sig  . acetaminophen (TYLENOL) 325 MG tablet Take 650 mg by mouth every 6 (six) hours as needed.  Marland Kitchen BIOTIN PO Take 5,000 mcg by mouth daily.   . Calcium 250 MG CAPS Take 1 capsule by mouth 3 (three) times daily.  . Cholecalciferol (VITAMIN D) 2000 units CAPS Take 1 capsule by mouth. 3 times per week.  . clobetasol cream (TEMOVATE) 2.87 % Apply 1 application topically daily as needed. Apply to scalp as needed.  Marland Kitchen estradiol (ESTRACE) 0.1 MG/GM vaginal cream Place 1 Applicatorful vaginally at bedtime.  . fexofenadine (ALLEGRA) 180 MG tablet Take 180 mg by mouth daily.  . flurazepam (DALMANE) 15 MG capsule Take 15 mg by mouth. 2-3 times a week.  . fluticasone (FLONASE) 50 MCG/ACT nasal spray Place 1 spray into the nose as needed. Sinus drainage  . GuaiFENesin (MUCINEX PO) Take 1 tablet by mouth every 12 (twelve) hours as needed (congestion).   Marland Kitchen ipratropium (ATROVENT) 0.03 % nasal spray As directed nasal spray  . naproxen sodium (ANAPROX) 220 MG tablet Take 220 mg daily as needed by mouth.   . neomycin-polymyxin-hydrocortisone (CORTISPORIN) 3.5-10000-0.5 cream Apply to lids daily as needed.  Marland Kitchen omeprazole (PRILOSEC) 20 MG capsule Take 20 mg daily as needed by mouth (for acid reflux).   [facility] Glycol-Propyl Glycol (SYSTANE OP) Apply to eye.  . traMADol (ULTRAM) 50 MG tablet Take 50 mg by mouth 2 (two) times daily as needed (for pain). Reported on 07/06/2015  . [DISCONTINUED] EVENING PRIMROSE OIL PO Take 1,500 mg by mouth 3  (three) times daily.    No facility-administered encounter medications on file as of 11/27/2016.     Allergies (verified) Actonel [risedronate sodium]; Amoxicillin; Erythromycin; Lipitor [atorvastatin calcium]; and Zetia [ezetimibe]   History: Past Medical History:  Diagnosis Date  . Allergy   . Arthritis   . Back pain   . Candidal esophagitis ([place]) 2010  . Chest pain, atypical   . Collagenous colitis    Dr. Maurene Capes  . Diverticulosis   . GERD (gastroesophageal reflux disease)   . Heart murmur   . Hiatal hernia 2010  . History of fibromyalgia   . Hypercalcemia   . Hyperlipidemia   . Hypertension   . IBS (irritable bowel syndrome)   . Insomnia   . Osteoporosis   . Rectocele   . Scoliosis   . Shingles 1975   at waistline, no chronic pain.    Marland Kitchen Spinal stenosis   . Vaginal prolapse    Past Surgical History:  Procedure Laterality Date  . ABDOMINAL HYSTERECTOMY    . BREAST BIOPSY  1964   left benign  . CARDIOVASCULAR STRESS TEST  12/03/2007   EF 72%  . CATARACT EXTRACTION W/ INTRAOCULAR LENS  IMPLANT, BILATERAL    . COLONOSCOPY  04/24/2006   diverticulosis  . FLEXIBLE SIGMOIDOSCOPY  07/27/2010   diverticulosis, collagenous colitis  . TONSILLECTOMY AND ADENOIDECTOMY    . UPPER  GASTROINTESTINAL ENDOSCOPY  01/29/2008   esophagitis, hiatal hernia, gastritis  . US ECHOCARDIOGRAPHY  10/30/2007    EF 55-60%   Family History  Problem Relation Age of Onset  . Breast cancer Mother        died at 23  . Stroke Mother   . Heart disease Father        died at 21  . Cirrhosis Sister        died at 22  . Heart disease Brother        died at 3  . Pneumonia Sister        died at 22 months  . Heart disease Sister        died at 46  . Cancer - Other Sister        stomach; died at 37  . Heart disease Brother        c-diff and sepsis; died at 40  . Colitis Sister   . Colon cancer Neg Hx   . Diabetes Neg Hx    Social History   Occupational History  . Occupation: retired     Comment: owned Human resources officer  Tobacco Use  . Smoking status: Never Smoker  . Smokeless tobacco: Never Used  Substance and Sexual Activity  . Alcohol use: Yes    Comment: rarely  . Drug use: No  . Sexual activity: No    Tobacco Counseling Counseling given: Not Answered   Activities of Daily Living In your present state of health, do you have any difficulty performing the following activities: 11/27/2016  Hearing? N  Vision? N  Difficulty concentrating or making decisions? Y  Walking or climbing stairs? Y  Dressing or bathing? N  Doing errands, shopping? Y  Preparing Food and eating ? N  Using the Toilet? N  In the past six months, have you accidently leaked urine? N  Do you have problems with loss of bowel control? N  Managing your Medications? N  Managing your Finances? N  Housekeeping or managing your Housekeeping? N  Some recent data might be hidden    Immunizations and Health Maintenance Immunization History  Administered Date(s) Administered  . Influenza-Unspecified 09/01/2015, 10/22/2016  . Pneumococcal Conjugate-13 11/07/2016  . Pneumococcal-Unspecified 01/02/2015  . Td 01/01/2010   There are no preventive care reminders to display for this patient.  Patient Care Team: Gayland Curry, DO as PCP - General (Geriatric Medicine) Gayland Curry, DO as PCP - Internal Medicine (Geriatric Medicine) Rolm Bookbinder, MD as Consulting Physician (Dermatology) Marti Sleigh, MD as Consulting Physician (Gynecology) Stanford Breed Denice Bors, MD as Consulting Physician (Cardiology) Luberta Mutter, MD as Consulting Physician (Ophthalmology) Jola Baptist, [place] as Referring Physician (Chiropractic Medicine)  Indicate any recent Medical Services you may have received from other than [facility] providers in the past year (date may be approximate).     Assessment:   This is a routine wellness examination for Beth Wise.   Hearing/Vision screen Hearing Screening Comments: Pt reports  no issues  Vision Screening Comments: Sees [facility] annually  Dietary issues and exercise activities discussed: Current Exercise Habits: The patient does not participate in regular exercise at present, Exercise limited by: orthopedic condition(s)  Goals    . DIET - INCREASE WATER INTAKE     Patient will use 3 thermoses to drink more water.      Depression Screen PHQ 2/9 Scores 11/27/2016 11/07/2016  PHQ - 2 Score 0 0    Fall Risk Fall Risk  11/27/2016 11/07/2016  Falls in the past year? No No    Cognitive Function: MMSE - Mini Mental State Exam 11/27/2016  Orientation to time 5  Orientation to Place 5  Registration 3  Attention/ Calculation 5  Recall 2  Language- name 2 objects 2  Language- repeat 1  Language- follow 3 step command 3  Language- read & follow direction 1  Write a sentence 1  Copy design 1  Total score 29        Screening Tests Health Maintenance  Topic Date Due  . MAMMOGRAM  09/05/2017  . TETANUS/TDAP  01/02/2020  . INFLUENZA VACCINE  Completed  . DEXA SCAN  Completed  . PNA vac Low Risk Adult  Completed      Plan:    I have personally reviewed and addressed the Medicare Annual Wellness questionnaire and have noted the following in the patient's chart:  A. Medical and social history B. Use of alcohol, tobacco or illicit drugs  C. Current medications and supplements D. Functional ability and status E.  Nutritional status F.  Physical activity G. Advance directives H. List of other physicians I.  Hospitalizations, surgeries, and ER visits in previous 12 months J.  [place] to include hearing, vision, cognitive, depression L. Referrals and appointments - none  In addition, I have reviewed and discussed with patient certain preventive protocols, quality metrics, and best practice recommendations. A written personalized care plan for preventive services as well as general preventive health recommendations were  provided to patient.  See attached scanned questionnaire for additional information.   Signed,   Rich Reining, RN Nurse Health Advisor   Quick Notes   Health Maintenance: Pt is on waiting list for [facility].     Abnormal Screen: MMSE 29/30. Passed clock drawing. 1st BP 146/60. 2nd BP 142/68     Patient Concerns: Pt is fatigued, breathless and pounding heartbeat after walking short distance.     Nurse Concerns: None

## 2016-11-29 ENCOUNTER — Telehealth: Payer: Self-pay

## 2016-11-29 NOTE — Addendum Note (Signed)
Addended by: Hollace Kinnier L on: 11/29/2016 01:23 PM   Modules accepted: Level of Service

## 2016-11-29 NOTE — Telephone Encounter (Signed)
Geri in medical records inquired about a release of records that was filled out incorrectly (refer to scanned new patient packed under document list)  New release of records mailed to patient, all components filled in for patient except signature and date. Notation made for patient to return release of records to [facility].

## 2017-01-03 ENCOUNTER — Telehealth: Payer: Self-pay | Admitting: *Deleted

## 2017-01-03 NOTE — Telephone Encounter (Signed)
Patient calling asking if she should see podiatrist? Pt states she's having stiffness in her feet and it's been going on for a while. Please advise

## 2017-01-04 NOTE — Telephone Encounter (Signed)
I would suspect arthritis in the feet with a complaint of stiffness. If she'd like to see a podiatrist, Janett Billow, the receptionist in health care, can help make arrangements to see one that comes to [facility]--[facility] or Dr. Mallie Mussel.

## 2017-01-04 NOTE — Telephone Encounter (Signed)
Spoke with patient and advised results, she will call healthcare and have this appt set up.

## 2017-01-15 DIAGNOSIS — M79672 Pain in left foot: Secondary | ICD-10-CM | POA: Diagnosis not present

## 2017-01-15 DIAGNOSIS — M79671 Pain in right foot: Secondary | ICD-10-CM | POA: Diagnosis not present

## 2017-02-22 ENCOUNTER — Telehealth: Payer: Self-pay | Admitting: *Deleted

## 2017-02-22 NOTE — Telephone Encounter (Signed)
Okay to transfer? Patient has only seen you once in Aug. 2018.

## 2017-02-22 NOTE — Telephone Encounter (Signed)
Copied from [place] [phone]. Topic: Appointment Scheduling - Scheduling Inquiry for Clinic >> Feb 22, 2017 12:12 PM Bea Graff, NT wrote: Reason for CRM: Pt would like to transfer care from Dr. Maudie Mercury to Dr. Jonni Sanger at [place]. She is a former pt of Dr. Jonni Sanger at [place]. Please call to schedule. Pt would also like to see what meds the dr can giver her.

## 2017-02-23 NOTE — Telephone Encounter (Signed)
[place] with me... Only saw her once.  Thanks.

## 2017-02-24 NOTE — Telephone Encounter (Signed)
Please call to schedule  Thanks

## 2017-02-25 NOTE — Telephone Encounter (Signed)
Pt has been scheduled, AJ

## 2017-02-27 DIAGNOSIS — L814 Other melanin hyperpigmentation: Secondary | ICD-10-CM | POA: Diagnosis not present

## 2017-02-27 DIAGNOSIS — L570 Actinic keratosis: Secondary | ICD-10-CM | POA: Diagnosis not present

## 2017-02-27 DIAGNOSIS — L298 Other pruritus: Secondary | ICD-10-CM | POA: Diagnosis not present

## 2017-02-27 DIAGNOSIS — D1801 Hemangioma of skin and subcutaneous tissue: Secondary | ICD-10-CM | POA: Diagnosis not present

## 2017-02-27 DIAGNOSIS — Z85828 Personal history of other malignant neoplasm of skin: Secondary | ICD-10-CM | POA: Diagnosis not present

## 2017-02-27 DIAGNOSIS — D225 Melanocytic nevi of trunk: Secondary | ICD-10-CM | POA: Diagnosis not present

## 2017-03-04 ENCOUNTER — Ambulatory Visit (INDEPENDENT_AMBULATORY_CARE_PROVIDER_SITE_OTHER): Payer: Medicare HMO | Admitting: Family Medicine

## 2017-03-04 ENCOUNTER — Other Ambulatory Visit: Payer: Self-pay

## 2017-03-04 ENCOUNTER — Encounter: Payer: Self-pay | Admitting: Family Medicine

## 2017-03-04 VITALS — BP 114/68 | HR 63 | Temp 98.3°F | Resp 14 | Ht 62.75 in | Wt 137.2 lb

## 2017-03-04 DIAGNOSIS — D649 Anemia, unspecified: Secondary | ICD-10-CM

## 2017-03-04 DIAGNOSIS — M810 Age-related osteoporosis without current pathological fracture: Secondary | ICD-10-CM | POA: Diagnosis not present

## 2017-03-04 DIAGNOSIS — M48061 Spinal stenosis, lumbar region without neurogenic claudication: Secondary | ICD-10-CM

## 2017-03-04 DIAGNOSIS — F5101 Primary insomnia: Secondary | ICD-10-CM

## 2017-03-04 DIAGNOSIS — I10 Essential (primary) hypertension: Secondary | ICD-10-CM

## 2017-03-04 DIAGNOSIS — R69 Illness, unspecified: Secondary | ICD-10-CM | POA: Diagnosis not present

## 2017-03-04 LAB — COMPREHENSIVE METABOLIC PANEL
ALT: 11 U/L (ref 0–35)
AST: 19 U/L (ref 0–37)
Albumin: 4 g/dL (ref 3.5–5.2)
Alkaline Phosphatase: 41 U/L (ref 39–117)
BUN: 21 mg/dL (ref 6–23)
CO2: 27 mEq/L (ref 19–32)
Calcium: 10.7 mg/dL — ABNORMAL HIGH (ref 8.4–10.5)
Chloride: 102 mEq/L (ref 96–112)
Creatinine, Ser: 1.06 mg/dL (ref 0.40–1.20)
GFR: 51.81 mL/min — ABNORMAL LOW (ref >60.00)
Glucose, Bld: 90 mg/dL (ref 70–99)
Potassium: 5 mEq/L (ref 3.5–5.1)
Sodium: 133 mEq/L — ABNORMAL LOW (ref 135–145)
Total Bilirubin: 0.3 mg/dL (ref 0.2–1.2)
Total Protein: 6.7 g/dL (ref 6.0–8.3)

## 2017-03-04 LAB — CBC WITH DIFFERENTIAL/PLATELET
Basophils Absolute: 0 10*3/uL (ref 0.0–0.1)
Basophils Relative: 0.8 % (ref 0.0–3.0)
Eosinophils Absolute: 0.1 10*3/uL (ref 0.0–0.7)
Eosinophils Relative: 1.1 % (ref 0.0–5.0)
HCT: 30.7 % — ABNORMAL LOW (ref 36.0–46.0)
Hemoglobin: 10.4 g/dL — ABNORMAL LOW (ref 12.0–15.0)
Lymphocytes Relative: 28.1 % (ref 12.0–46.0)
Lymphs Abs: 1.6 10*3/uL (ref 0.7–4.0)
MCHC: 33.9 g/dL (ref 30.0–36.0)
MCV: 94.6 fl (ref 78.0–100.0)
Monocytes Absolute: 0.5 10*3/uL (ref 0.1–1.0)
Monocytes Relative: 8.2 % (ref 3.0–12.0)
Neutro Abs: 3.5 10*3/uL (ref 1.4–7.7)
Neutrophils Relative %: 61.8 % (ref 43.0–77.0)
Platelets: 206 10*3/uL (ref 150.0–400.0)
RBC: 3.24 Mil/uL — ABNORMAL LOW (ref 3.87–5.11)
RDW: 13.8 % (ref 11.5–15.5)
WBC: 5.6 10*3/uL (ref 4.0–10.5)

## 2017-03-04 LAB — VITAMIN D 25 HYDROXY (VIT D DEFICIENCY, FRACTURES): VITD: 58.16 ng/mL (ref 30.00–100.00)

## 2017-03-04 NOTE — Patient Instructions (Addendum)
It was so good seeing you again! Thank you for establishing with my new practice and allowing me to continue caring for you. It means a lot to me.   Please schedule a follow up appointment with me in 6 months for recheck.   You may use OTC melatonin 3-5 mg at night as needed for sleep.   We will call you to schedule your Prolia injections.

## 2017-03-04 NOTE — Progress Notes (Signed)
Subjective  CC:  Chief Complaint  Patient presents with  . Establish Care    HPI: Beth Wise is a 84 y.o. female who presents to [place] at [facility] today to establish care with me as a new patient. She is a former [facility] patient and is here to reestablish care with me today.  Had AWV and medicare physical in November. I reviewed notes/labs.   She has the following concerns or needs:   Now living at [facility]. Doing well overall. She has multiple well controlled problems as listed/updated below. Has several questions regarding pain meds for spinal stenosis - tylenol, alleve or tramadol. Sleep meds - has been on benzo's for years for sleep. Chronic gerd on ppi.   Osteoporosis overdue for prolia injection. Last was last summer.   Cardiovascular - stable. Blood pressure is controlled.   HM is up to date.   We updated and reviewed the patient's past history in detail and it is documented below.  Patient Active Problem List   Diagnosis Date Noted  . Spinal stenosis of lumbar region without neurogenic claudication 11/26/2016    Priority: High  . Benign essential hypertension 11/26/2016    Priority: High  . Senile osteoporosis 11/26/2016    Priority: High  . Chronic diastolic CHF (congestive heart failure), NYHA class 1 ([place]) 11/26/2016    Priority: High  . GERD 04/03/2007    Priority: High  . Thoracogenic scoliosis of thoracolumbar region 11/26/2016    Priority: Medium  . Postmenopause atrophic vaginitis 11/26/2016    Priority: Medium  . Rectocele 08/30/2016    Priority: Medium  . Mitral regurgitation 02/22/2011    Priority: Low  . Primary insomnia 11/26/2016  . Dry mouth 11/26/2016  . Collagenous colitis 04/03/2007   Health Maintenance  Topic Date Due  . MAMMOGRAM  09/05/2017  . DEXA SCAN  09/14/2017  . TETANUS/TDAP  01/02/2020  . INFLUENZA VACCINE  Completed  . PNA vac Low Risk Adult  Completed   Immunization History  Administered  Date(s) Administered  . Influenza-Unspecified 09/01/2015, 10/22/2016  . Pneumococcal Conjugate-13 11/07/2016  . Pneumococcal-Unspecified 01/02/2015  . Td 01/01/2010  . Zoster Recombinat (Shingrix) 02/14/2017   Current Meds  Medication Sig  . acetaminophen (TYLENOL) 325 MG tablet Take 650 mg by mouth every 6 (six) hours as needed.  Marland Kitchen BIOTIN PO Take 5,000 mcg by mouth daily.   . Calcium 250 MG CAPS Take 1 capsule by mouth 3 (three) times daily.  . Cholecalciferol (VITAMIN D) 2000 units CAPS Take 1 capsule by mouth. 3 times per week.  . clobetasol cream (TEMOVATE) 3.35 % Apply 1 application topically daily as needed. Apply to scalp as needed.  Marland Kitchen estradiol (ESTRACE) 0.1 MG/GM vaginal cream Place 1 Applicatorful vaginally at bedtime.  . flurazepam (DALMANE) 15 MG capsule Take 15 mg by mouth. 2-3 times a week.  . fluticasone (FLONASE) 50 MCG/ACT nasal spray Place 1 spray into the nose as needed. Sinus drainage  . ipratropium (ATROVENT) 0.03 % nasal spray As directed nasal spray  . neomycin-polymyxin-hydrocortisone (CORTISPORIN) 3.5-10000-0.5 cream Apply to lids daily as needed.  Marland Kitchen omeprazole (PRILOSEC) 20 MG capsule Take 20 mg daily as needed by mouth (for acid reflux).   [name] Glycol-Propyl Glycol (SYSTANE OP) Apply to eye.  . traMADol (ULTRAM) 50 MG tablet Take 50 mg by mouth 2 (two) times daily as needed (for pain). Reported on 07/06/2015    Allergies: Patient is allergic to actonel [risedronate sodium]; amoxicillin;  erythromycin; lipitor [atorvastatin calcium]; and zetia [ezetimibe]. Past Medical History Patient  has a past medical history of Allergy, Arthritis, Back pain, Candidal esophagitis ([place]) (2010), Chest pain, atypical, Collagenous colitis, Diverticulosis, GERD (gastroesophageal reflux disease), Heart murmur, Hiatal hernia (2010), History of fibromyalgia, Hypercalcemia, Hyperlipidemia, Hypertension, IBS (irritable bowel syndrome), Insomnia, Osteoporosis, Rectocele, Scoliosis,  Shingles (1975), Spinal stenosis, and Vaginal prolapse. Past Surgical History Patient  has a past surgical history that includes Cataract extraction w/ intraocular lens  implant, bilateral; Upper gastrointestinal endoscopy (01/29/2008); Colonoscopy (04/24/2006); Abdominal hysterectomy; Breast biopsy (1964); Tonsillectomy and adenoidectomy; US ECHOCARDIOGRAPHY (10/30/2007); Cardiovascular stress test (12/03/2007); and Flexible sigmoidoscopy (07/27/2010). Family History: Patient family history includes Breast cancer in her mother; Cancer - Other in her sister; Cirrhosis in her sister; Colitis in her sister; Heart disease in her brother, brother, father, and sister; Pneumonia in her sister; Stroke in her mother. Social History:  Patient  reports that  has never smoked. she has never used smokeless tobacco. She reports that she drinks alcohol. She reports that she does not use drugs.  Review of Systems: Constitutional: negative for fever or malaise Ophthalmic: negative for photophobia, double vision or loss of vision Cardiovascular: negative for chest pain, dyspnea on exertion, or new LE swelling Respiratory: negative for SOB or persistent cough Gastrointestinal: negative for abdominal pain, change in bowel habits or melena Genitourinary: negative for dysuria or gross hematuria Musculoskeletal: negative for new gait disturbance or muscular weakness Integumentary: negative for new or persistent rashes Neurological: negative for TIA or stroke symptoms Psychiatric: negative for SI or delusions Allergic/Immunologic: negative for hives  Patient Care Team    Relationship Specialty Notifications Start End  Gayland Curry, DO PCP - General Geriatric Medicine  11/12/16   Gayland Curry, DO PCP - Internal Medicine Geriatric Medicine  10/26/16   Rolm Bookbinder, MD Consulting Physician Dermatology  10/26/16   Marti Sleigh, MD Consulting Physician Gynecology  10/26/16   Lelon Perla, MD  Consulting Physician Cardiology  10/26/16   Luberta Mutter, MD Consulting Physician Ophthalmology  10/26/16   Jola Baptist, [place] Referring Physician Chiropractic Medicine  10/26/16     Objective  Vitals: BP 114/68   Pulse 63   Temp 98.3 F (36.8 C) (Oral)   Resp 14   Ht 5' 2.75" (1.594 m)   Wt 137 lb 3.2 oz (62.2 kg)   SpO2 98%   BMI 24.50 kg/m  General:  Well developed, well nourished, no acute distress  Psych:  Alert and oriented,normal mood and affect HEENT:  Normocephalic, atraumatic, non-icteric sclera, PERRL, oropharynx is without mass or exudate, supple neck without adenopathy, mass or thyromegaly Cardiovascular:  RRR with 2/6 systolic murmur, no leg edema Respiratory:  Good breath sounds bilaterally, CTAB with normal respiratory effort Neurologic:    Mental status is normal. Gross motor and sensory exams are normal. Normal gait  Assessment  1. Benign essential hypertension   2. Senile osteoporosis   3. Normocytic anemia   4. Spinal stenosis of lumbar region without neurogenic claudication   5. Primary insomnia      Plan   Blood pressure is controlled. Nl renal function. Continue current medications. Recheck labs today.   H/o anemia mild. Recheck today.   Osteoporosis - to set up prolia injections again. Check vit D and calcium levels today.   Insomnia - start melatonin.   Lumbar stenosis: tylenol; alleve if needed and rarely.   Follow up:  6 months.  Commons side effects, risks, benefits, and alternatives for  medications and treatment plan prescribed today were discussed, and the patient expressed understanding of the given instructions. Patient is instructed to call or message via MyChart if he/she has any questions or concerns regarding our treatment plan. No barriers to understanding were identified. We discussed Red Flag symptoms and signs in detail. Patient expressed understanding regarding what to do in case of urgent or emergency type symptoms.    Medication list was reconciled, printed and provided to the patient in AVS. Patient instructions and summary information was reviewed with the patient as documented in the AVS. This note was prepared with assistance of Dragon voice recognition software. Occasional wrong-word or sound-a-like substitutions may have occurred due to the inherent limitations of voice recognition software  Orders Placed This Encounter  Procedures  . VITAMIN D 25 Hydroxy (Vit-D Deficiency, Fractures)  . Comprehensive metabolic panel  . CBC with Differential/Platelet  . Iron, TIBC and Ferritin Panel   No orders of the defined types were placed in this encounter.

## 2017-03-05 ENCOUNTER — Other Ambulatory Visit: Payer: Medicare HMO

## 2017-03-05 ENCOUNTER — Telehealth: Payer: Self-pay | Admitting: Family Medicine

## 2017-03-05 LAB — IRON,TIBC AND FERRITIN PANEL
%SAT: 24 % (calc) (ref 11–50)
Ferritin: 764 ng/mL — ABNORMAL HIGH (ref 20–288)
Iron: 63 ug/dL (ref 45–160)
TIBC: 261 mcg/dL (calc) (ref 250–450)

## 2017-03-05 NOTE — Telephone Encounter (Signed)
Pt called to get lab results and she had a question regarding taking the prolia injection. She wanted to know if she can get it after the lab appointment tomorrow. She did not want to have to make another trip over to the office. She is requesting a call back please.

## 2017-03-06 ENCOUNTER — Telehealth: Payer: Self-pay | Admitting: Family Medicine

## 2017-03-06 ENCOUNTER — Encounter: Payer: Self-pay | Admitting: Internal Medicine

## 2017-03-06 ENCOUNTER — Other Ambulatory Visit (INDEPENDENT_AMBULATORY_CARE_PROVIDER_SITE_OTHER): Payer: Medicare HMO

## 2017-03-06 ENCOUNTER — Other Ambulatory Visit: Payer: Self-pay | Admitting: Emergency Medicine

## 2017-03-06 ENCOUNTER — Other Ambulatory Visit: Payer: Medicare HMO

## 2017-03-06 NOTE — Telephone Encounter (Signed)
LM for patient letting her know that we are still waiting on the approval from insurance for the prolia injection. If she needs a closer lab to do her bloodwork, she can go wherever is closest for her. LM  For her to call to let us know what she wants to do.

## 2017-03-06 NOTE — Telephone Encounter (Signed)
error 

## 2017-03-06 NOTE — Telephone Encounter (Signed)
Orders are in for appointment.

## 2017-03-06 NOTE — Telephone Encounter (Signed)
When Beth Wise called to get her lab results, in the comment of her results was to have her come back to draw a PTH. Beth Wise was scheduled to come by on 03/06/17 at 2pm for that.  Follow up with office to make sure that she has a lab order placed for the PTH. Spoke with Benjamine Mola at [facility].

## 2017-03-07 LAB — PTH, INTACT AND CALCIUM
Calcium: 10.3 mg/dL (ref 8.6–10.4)
PTH: 83 pg/mL — ABNORMAL HIGH (ref 14–64)

## 2017-03-11 ENCOUNTER — Encounter: Payer: Self-pay | Admitting: Family Medicine

## 2017-03-11 DIAGNOSIS — E210 Primary hyperparathyroidism: Secondary | ICD-10-CM

## 2017-03-11 HISTORY — DX: Primary hyperparathyroidism: E21.0

## 2017-03-11 NOTE — Progress Notes (Signed)
Please call patient: I have reviewed his/her lab results. She has high normal to minimally elevated calcium levels and a high PTH - thus, she has something called primary hyperparathyroidism - this is mild overacting parathyroid glands - they sit upon the thyroid gland. At this point, hold calcium supplementation and we will follow these levels every 6 months. No intervention is needed at this time.

## 2017-03-12 ENCOUNTER — Telehealth: Payer: Self-pay | Admitting: Emergency Medicine

## 2017-03-12 NOTE — Telephone Encounter (Signed)
Spoke with Patient and discussed lab results.   AP, LPN

## 2017-03-14 ENCOUNTER — Ambulatory Visit (INDEPENDENT_AMBULATORY_CARE_PROVIDER_SITE_OTHER): Payer: Medicare HMO | Admitting: Emergency Medicine

## 2017-03-14 DIAGNOSIS — M810 Age-related osteoporosis without current pathological fracture: Secondary | ICD-10-CM

## 2017-03-14 MED ORDER — DENOSUMAB 60 MG/ML ~~LOC~~ SOLN
60.0000 mg | Freq: Once | SUBCUTANEOUS | Status: AC
  Administered 2017-03-14: 60 mg via SUBCUTANEOUS

## 2017-03-19 ENCOUNTER — Telehealth: Payer: Self-pay | Admitting: Family Medicine

## 2017-03-19 ENCOUNTER — Encounter: Payer: Self-pay | Admitting: Emergency Medicine

## 2017-03-19 NOTE — Telephone Encounter (Signed)
Copied from [place]. Topic: Quick Communication - See Telephone Encounter >> Mar 19, 2017  4:46 PM Bea Graff, NT wrote: CRM for notification. See Telephone encounter for: Pt is wanting to know if she should start taking magnesium again.   03/19/17.

## 2017-03-20 NOTE — Telephone Encounter (Signed)
Patient wants to know what amount of magnesium she should take daily? Please Advise.

## 2017-03-21 NOTE — Telephone Encounter (Signed)
Patient should not be taking magnesium - I have not recommended it. Why did she take it before?? It is not needed in my opinion.

## 2017-03-21 NOTE — Telephone Encounter (Signed)
Patient informed, per Dr. Jonni Sanger, not recommended to take magnesium. Patient verbalized understanding. AP, LPN

## 2017-04-09 NOTE — Progress Notes (Signed)
HPI: FU hypertension. She has a past history of labile hypertension and atypical chest pain. Adenosine Cardiolite December 2009 normal.  Last echocardiogram February 2018 showed normal LV systolic function, grade 1 diastolic dysfunction, moderate aortic regurgitation, mild to moderate mitral regurgitation, mild left atrial enlargement and mild tricuspid regurgitation.  Since last seen, the patient has dyspnea with more extreme activities but not with routine activities. It is relieved with rest. It is not associated with chest pain. There is no orthopnea, PND or pedal edema. There is no syncope or palpitations. There is no exertional chest pain.   Current Outpatient Medications  Medication Sig Dispense Refill  . acetaminophen (TYLENOL) 325 MG tablet Take 650 mg by mouth every 6 (six) hours as needed.    Marland Kitchen BIOTIN PO Take 5,000 mcg by mouth daily.     . Calcium 250 MG CAPS Take 1 capsule by mouth 3 (three) times daily.    . Cholecalciferol (VITAMIN D) 2000 units CAPS Take 1 capsule by mouth. 3 times per week.    . clobetasol cream (TEMOVATE) 2.58 % Apply 1 application topically daily as needed. Apply to scalp as needed.    Marland Kitchen estradiol (ESTRACE) 0.1 MG/GM vaginal cream Place 1 Applicatorful vaginally at bedtime.    . fexofenadine (ALLEGRA) 180 MG tablet Take 180 mg by mouth daily.    . flurazepam (DALMANE) 15 MG capsule Take 15 mg by mouth. 2-3 times a week.    . fluticasone (FLONASE) 50 MCG/ACT nasal spray Place 1 spray into the nose as needed. Sinus drainage    . GuaiFENesin (MUCINEX PO) Take 1 tablet by mouth every 12 (twelve) hours as needed (congestion).     Marland Kitchen ipratropium (ATROVENT) 0.03 % nasal spray As directed nasal spray    . naproxen sodium (ANAPROX) 220 MG tablet Take 220 mg daily as needed by mouth.     . neomycin-polymyxin-hydrocortisone (CORTISPORIN) 3.5-10000-0.5 cream Apply to lids daily as needed.    Marland Kitchen omeprazole (PRILOSEC) 20 MG capsule Take 20 mg daily as needed by mouth  (for acid reflux).     [name] Glycol-Propyl Glycol (SYSTANE OP) Apply to eye.    . traMADol (ULTRAM) 50 MG tablet Take 50 mg by mouth 2 (two) times daily as needed (for pain). Reported on 07/06/2015     No current facility-administered medications for this visit.      Past Medical History:  Diagnosis Date  . Allergy   . Arthritis   . Back pain   . Candidal esophagitis ([place]) 2010  . Chest pain, atypical   . Collagenous colitis    Dr. Maurene Capes  . Diverticulosis   . GERD (gastroesophageal reflux disease)   . Heart murmur   . Hiatal hernia 2010  . History of fibromyalgia   . Hypercalcemia   . Hyperlipidemia   . Hypertension   . IBS (irritable bowel syndrome)   . Insomnia   . Osteoporosis   . Primary hyperparathyroidism ([place]) 03/11/2017  . Rectocele   . Scoliosis   . Shingles 1975   at waistline, no chronic pain.    Marland Kitchen Spinal stenosis   . Vaginal prolapse     Past Surgical History:  Procedure Laterality Date  . ABDOMINAL HYSTERECTOMY    . BREAST BIOPSY  1964   left benign  . CARDIOVASCULAR STRESS TEST  12/03/2007   EF 72%  . CATARACT EXTRACTION W/ INTRAOCULAR LENS  IMPLANT, BILATERAL    . COLONOSCOPY  04/24/2006   diverticulosis  .  FLEXIBLE SIGMOIDOSCOPY  07/27/2010   diverticulosis, collagenous colitis  . TONSILLECTOMY AND ADENOIDECTOMY    . UPPER GASTROINTESTINAL ENDOSCOPY  01/29/2008   esophagitis, hiatal hernia, gastritis  . US ECHOCARDIOGRAPHY  10/30/2007    EF 55-60%    Social History   Socioeconomic History  . Marital status: Widowed    Spouse name: Not on file  . Number of children: 2  . Years of education: Not on file  . Highest education level: Not on file  Occupational History  . Occupation: retired    Comment: owned Human resources officer  Social Needs  . Financial resource strain: Not hard at all  . Food insecurity:    Worry: Never true    Inability: Never true  . Transportation needs:    Medical: No    Non-medical: No  Tobacco Use  . Smoking status:  Never Smoker  . Smokeless tobacco: Never Used  Substance and Sexual Activity  . Alcohol use: Yes    Comment: rarely  . Drug use: No  . Sexual activity: Never  Lifestyle  . Physical activity:    Days per week: 7 days    Minutes per session: 10 min  . Stress: Only a little  Relationships  . Social connections:    Talks on phone: More than three times a week    Gets together: More than three times a week    Attends religious service: 1 to 4 times per year    Active member of club or organization: Yes    Attends meetings of clubs or organizations: Never    Relationship status: Widowed  . Intimate partner violence:    Fear of current or ex partner: No    Emotionally abused: No    Physically abused: No    Forced sexual activity: No  Other Topics Concern  . Not on file  Social History Narrative   Social History      Diet? Regular (shouldn't have excessive amts of acid, hot &spicey foods)      Do you drink/eat things with caffeine? yes      Marital status?       widow                             What year were you married? [place] you live in a house, apartment, assisted living, condo, trailer, etc.? apartment      Is it one or more stories? 1      How many persons live in your home? 1 (self)      Do you have any pets in your home? (please list) no no !!      Highest level of education completed?      Current or past profession: hairdresser      Do you exercise?      little                                Type & how often? Leg lifts and plank, stretching every day.      Advanced Directives      Do you have a living will?  yes      Do you have a DNR form?         yes  If not, do you want to discuss one? yes      Do you have signed POA/HPOA for forms? yes      Functional Status      Do you have difficulty bathing or dressing yourself?      Do you have difficulty preparing food or eating?       Do you have difficulty managing your  medications?      Do you have difficulty managing your finances?      Do you have difficulty affording your medications?    Family History  Problem Relation Age of Onset  . Breast cancer Mother        died at 29  . Stroke Mother   . Heart disease Father        died at 51  . Cirrhosis Sister        died at 35  . Heart disease Brother        died at 90  . Pneumonia Sister        died at 33 months  . Heart disease Sister        died at 76  . Cancer - Other Sister        stomach; died at 53  . Heart disease Brother        c-diff and sepsis; died at 81  . Colitis Sister   . Colon cancer Neg Hx   . Diabetes Neg Hx     ROS: back pain but no fevers or chills, productive cough, hemoptysis, dysphasia, odynophagia, melena, hematochezia, dysuria, hematuria, rash, seizure activity, orthopnea, PND, pedal edema, claudication. Remaining systems are negative.  Physical Exam: Well-developed well-nourished in no acute distress.  Skin is warm and dry.  HEENT is normal.  Neck is supple.  Chest is clear to auscultation with normal expansion.  Cardiovascular exam is regular rate and rhythm.  Abdominal exam nontender or distended. No masses palpated. Extremities show no edema. neuro grossly intact  ECG-normal sinus rhythm, first-degree AV block, nonspecific ST changes.  Personally reviewed  A/P  1 hypertension-blood pressure mildly elevated but typically controlled.  Continue present medications and follow.  2 aortic insufficiency/mitral regurgitation-schedule follow-up echocardiogram. Conservative measures if possible given age.  3 hyperlipidemia-managed by primary care.  Kirk Ruths, MD

## 2017-04-10 DIAGNOSIS — Z961 Presence of intraocular lens: Secondary | ICD-10-CM | POA: Diagnosis not present

## 2017-04-10 DIAGNOSIS — H524 Presbyopia: Secondary | ICD-10-CM | POA: Diagnosis not present

## 2017-04-10 DIAGNOSIS — H40013 Open angle with borderline findings, low risk, bilateral: Secondary | ICD-10-CM | POA: Diagnosis not present

## 2017-04-11 DIAGNOSIS — Z0100 Encounter for examination of eyes and vision without abnormal findings: Secondary | ICD-10-CM | POA: Diagnosis not present

## 2017-04-22 ENCOUNTER — Encounter: Payer: Self-pay | Admitting: Cardiology

## 2017-04-22 ENCOUNTER — Ambulatory Visit: Payer: Medicare HMO | Admitting: Cardiology

## 2017-04-22 VITALS — BP 154/82 | HR 72 | Ht 63.0 in | Wt 137.0 lb

## 2017-04-22 DIAGNOSIS — I10 Essential (primary) hypertension: Secondary | ICD-10-CM | POA: Diagnosis not present

## 2017-04-22 DIAGNOSIS — I359 Nonrheumatic aortic valve disorder, unspecified: Secondary | ICD-10-CM | POA: Diagnosis not present

## 2017-04-22 DIAGNOSIS — E7800 Pure hypercholesterolemia, unspecified: Secondary | ICD-10-CM | POA: Diagnosis not present

## 2017-04-22 NOTE — Patient Instructions (Signed)

## 2017-04-23 DIAGNOSIS — Z0100 Encounter for examination of eyes and vision without abnormal findings: Secondary | ICD-10-CM | POA: Diagnosis not present

## 2017-04-25 ENCOUNTER — Telehealth: Payer: Self-pay | Admitting: Emergency Medicine

## 2017-04-25 NOTE — Telephone Encounter (Signed)
Patient is wanting to see her GI doctor. Patient will call and schedule with them.   Doloris Hall,  LPN

## 2017-04-25 NOTE — Telephone Encounter (Signed)
Reason for CRM: pt called to have the nurse of her pcp to contact her; pt would not disclose any information about the phone call.   Patient states that she has been having episodes over last couple days of feeling the great urge of having of having a big bowel movement but then she cannot get anything out. Patient would like to know if she can do anything for this or should she contact her GI doctor. Please advise.    Doloris Hall, LPN

## 2017-04-25 NOTE — Telephone Encounter (Signed)
LM to  RC, CRM Created  

## 2017-04-25 NOTE — Telephone Encounter (Signed)
Please schedule an office visit for evaluation.

## 2017-05-01 ENCOUNTER — Other Ambulatory Visit (HOSPITAL_COMMUNITY): Payer: Medicare HMO

## 2017-05-02 ENCOUNTER — Ambulatory Visit: Payer: Self-pay | Admitting: *Deleted

## 2017-05-02 NOTE — Telephone Encounter (Signed)
Pt  Reports symptoms of diarrhea for 6 days not relieved by OTC  Meds. Pt reports has been drinking lots of liquids. History according to patient of ulcerative colitis and IBS. Appointment made with Dr Jonni Sanger  In am   Reason for Disposition . [1] MODERATE diarrhea (e.g., 4-6 times / day more than normal) AND [2] present > 48 hours (2 days)  Answer Assessment - Initial Assessment Questions 1. DIARRHEA SEVERITY: "How bad is the diarrhea?" "How many extra stools have you had in the past 24 hours than normal?"    - MILD: Few loose or mushy BMs; increase of 1-3 stools over normal daily number of stools; mild increase in ostomy output.   - MODERATE: Increase of 4-6 stools daily over normal; moderate increase in ostomy output.   - SEVERE (or Worst Possible): Increase of 7 or more stools daily over normal; moderate increase in ostomy output; incontinence.     3-5   2. ONSET: "When did the diarrhea begin?"       6  Days    3. BM CONSISTENCY: "How loose or watery is the diarrhea?"        Watery   4. VOMITING: "Are you also vomiting?" If so, ask: "How many times in the past 24 hours?"       Nausea  But  No  Vomiting   5. ABDOMINAL PAIN: "Are you having any abdominal pain?" If yes: "What does it feel like?" (e.g., crampy, dull, intermittent, constant)         No   6. ABDOMINAL PAIN SEVERITY: If present, ask: "How bad is the pain?"  (e.g., Scale 1-10; mild, moderate, or severe)    - MILD (1-3): doesn't interfere with normal activities, abdomen soft and not tender to touch     - MODERATE (4-7): interferes with normal activities or awakens from sleep, tender to touch     - SEVERE (8-10): excruciating pain, doubled over, unable to do any normal activities       n/a 7. ORAL INTAKE: If vomiting, "Have you been able to drink liquids?" "How much fluids have you had in the past 24 hours?"       Yes  Taking  Ginger water  -  Drinks  Brat Diet   8. HYDRATION: "Any signs of dehydration?" (e.g., dry mouth [not just  dry lips], too weak to stand, dizziness, new weight loss) "When did you last urinate?"         Yes  Last  Voided  approx  2  Hours  Ago   9. EXPOSURE: "Have you traveled to a foreign country recently?" "Have you been exposed to anyone with diarrhea?" "Could you have eaten any food that was spoiled?"     na 10. OTHER SYMPTOMS: "Do you have any other symptoms?" (e.g., fever, blood in stool)      No  11. PREGNANCY: "Is there any chance you are pregnant?" "When was your last menstrual period?"         no  Protocols used: DIARRHEA-A-AH

## 2017-05-03 ENCOUNTER — Telehealth: Payer: Self-pay | Admitting: *Deleted

## 2017-05-03 ENCOUNTER — Ambulatory Visit (INDEPENDENT_AMBULATORY_CARE_PROVIDER_SITE_OTHER): Payer: Medicare HMO | Admitting: Family Medicine

## 2017-05-03 ENCOUNTER — Other Ambulatory Visit: Payer: Self-pay

## 2017-05-03 ENCOUNTER — Encounter: Payer: Self-pay | Admitting: Family Medicine

## 2017-05-03 VITALS — BP 112/58 | HR 81 | Temp 98.3°F | Ht 63.0 in | Wt 132.6 lb

## 2017-05-03 DIAGNOSIS — R197 Diarrhea, unspecified: Secondary | ICD-10-CM | POA: Diagnosis not present

## 2017-05-03 DIAGNOSIS — K529 Noninfective gastroenteritis and colitis, unspecified: Secondary | ICD-10-CM

## 2017-05-03 DIAGNOSIS — R42 Dizziness and giddiness: Secondary | ICD-10-CM

## 2017-05-03 LAB — COMPREHENSIVE METABOLIC PANEL
ALT: 7 U/L (ref 0–35)
AST: 15 U/L (ref 0–37)
Albumin: 3.9 g/dL (ref 3.5–5.2)
Alkaline Phosphatase: 39 U/L (ref 39–117)
BUN: 10 mg/dL (ref 6–23)
CO2: 25 mEq/L (ref 19–32)
Calcium: 10.3 mg/dL (ref 8.4–10.5)
Chloride: 104 mEq/L (ref 96–112)
Creatinine, Ser: 0.99 mg/dL (ref 0.40–1.20)
GFR: 56.04 mL/min — ABNORMAL LOW (ref >60.00)
Glucose, Bld: 110 mg/dL — ABNORMAL HIGH (ref 70–99)
Potassium: 4.7 mEq/L (ref 3.5–5.1)
Sodium: 137 mEq/L (ref 135–145)
Total Bilirubin: 0.3 mg/dL (ref 0.2–1.2)
Total Protein: 6.5 g/dL (ref 6.0–8.3)

## 2017-05-03 LAB — CBC WITH DIFFERENTIAL/PLATELET
Basophils Absolute: 0 10*3/uL (ref 0.0–0.1)
Basophils Relative: 0.6 % (ref 0.0–3.0)
Eosinophils Absolute: 0 10*3/uL (ref 0.0–0.7)
Eosinophils Relative: 0.4 % (ref 0.0–5.0)
HCT: 30.9 % — ABNORMAL LOW (ref 36.0–46.0)
Hemoglobin: 10.4 g/dL — ABNORMAL LOW (ref 12.0–15.0)
Lymphocytes Relative: 19.6 % (ref 12.0–46.0)
Lymphs Abs: 1 10*3/uL (ref 0.7–4.0)
MCHC: 33.6 g/dL (ref 30.0–36.0)
MCV: 94.6 fl (ref 78.0–100.0)
Monocytes Absolute: 0.7 10*3/uL (ref 0.1–1.0)
Monocytes Relative: 12.7 % — ABNORMAL HIGH (ref 3.0–12.0)
Neutro Abs: 3.6 10*3/uL (ref 1.4–7.7)
Neutrophils Relative %: 66.7 % (ref 43.0–77.0)
Platelets: 241 10*3/uL (ref 150.0–400.0)
RBC: 3.27 Mil/uL — ABNORMAL LOW (ref 3.87–5.11)
RDW: 13.3 % (ref 11.5–15.5)
WBC: 5.3 10*3/uL (ref 4.0–10.5)

## 2017-05-03 LAB — SEDIMENTATION RATE: Sed Rate: 18 mm/hr (ref 0–30)

## 2017-05-03 MED ORDER — PREDNISONE 20 MG PO TABS: 20.0000 mg | ORAL_TABLET | Freq: Every day | ORAL | 0 refills | Status: DC

## 2017-05-03 NOTE — Telephone Encounter (Signed)
Please send new RX to correct pharmacy

## 2017-05-03 NOTE — Progress Notes (Signed)
Subjective  CC:  Chief Complaint  Patient presents with  . Diarrhea    x 1 week, patient states she feels weak today     HPI: Beth Wise is a 84 y.o. female who presents to the office today to address the problems listed above in the chief complaint.  Pt reports 5-6 days of loose, painless, watery diarrhea not associated with bloating, fevers, chills, hematochezia; + nausea w/o vomiting and decreased appetite. Has had h/o colitis x 2-3; followed/managed by GI. Reviewed most recent notes. Had EGD in 2017. H/o collagenous colitis responsive to high dose steroids in 2012. No melena. No blood or mucous in stool. Feels lightheaded today and has noticed rapid heart rate w/o sob or cp. Oral intake has been decreased due to nausea; had been stooling multiple times / day. Using immodium with mild improvement in sxs.    I reviewed the patients updated PMH, FH, and SocHx.    Patient Active Problem List   Diagnosis Date Noted  . Spinal stenosis of lumbar region without neurogenic claudication 11/26/2016    Priority: High  . Benign essential hypertension 11/26/2016    Priority: High  . Senile osteoporosis 11/26/2016    Priority: High  . Chronic diastolic CHF (congestive heart failure), NYHA class 1 ([place]) 11/26/2016    Priority: High  . GERD 04/03/2007    Priority: High  . Primary hyperparathyroidism ([place]) 03/11/2017    Priority: Medium  . Primary insomnia 11/26/2016    Priority: Medium  . Thoracogenic scoliosis of thoracolumbar region 11/26/2016    Priority: Medium  . Postmenopause atrophic vaginitis 11/26/2016    Priority: Medium  . Rectocele 08/30/2016    Priority: Medium  . Mitral regurgitation 02/22/2011    Priority: Low  . Dry mouth 11/26/2016  . Collagenous colitis 04/03/2007   Current Meds  Medication Sig  . acetaminophen (TYLENOL) 325 MG tablet Take 650 mg by mouth every 6 (six) hours as needed.  Marland Kitchen BIOTIN PO Take 5,000 mcg by mouth daily.   . Cholecalciferol (VITAMIN D)  2000 units CAPS Take 1 capsule by mouth. 3 times per week.  . estradiol (ESTRACE) 0.1 MG/GM vaginal cream Place 1 Applicatorful vaginally at bedtime.  . flurazepam (DALMANE) 15 MG capsule Take 15 mg by mouth. 2-3 times a week.  . fluticasone (FLONASE) 50 MCG/ACT nasal spray Place 1 spray into the nose as needed. Sinus drainage  . GuaiFENesin (MUCINEX PO) Take 1 tablet by mouth every 12 (twelve) hours as needed (congestion).   Marland Kitchen ipratropium (ATROVENT) 0.03 % nasal spray As directed nasal spray  . naproxen sodium (ANAPROX) 220 MG tablet Take 220 mg daily as needed by mouth.   . neomycin-polymyxin-hydrocortisone (CORTISPORIN) 3.5-10000-0.5 cream Apply to lids daily as needed.  Marland Kitchen omeprazole (PRILOSEC) 20 MG capsule Take 20 mg daily as needed by mouth (for acid reflux).   [name] Glycol-Propyl Glycol (SYSTANE OP) Apply to eye.    Allergies: Patient is allergic to actonel [risedronate sodium]; amoxicillin; erythromycin; lipitor [atorvastatin calcium]; and zetia [ezetimibe]. Family History: Patient family history includes Breast cancer in her mother; Cancer - Other in her sister; Cirrhosis in her sister; Colitis in her sister; Heart disease in her brother, brother, father, and sister; Pneumonia in her sister; Stroke in her mother. Social History:  Patient  reports that she has never smoked. She has never used smokeless tobacco. She reports that she drinks alcohol. She reports that she does not use drugs.  Review of Systems: Constitutional: Negative for  fever malaise or anorexia Cardiovascular: negative for chest pain Respiratory: negative for SOB or persistent cough Gastrointestinal: negative for abdominal pain  Objective  Vitals: BP (!) 112/58   Pulse 81   Temp 98.3 F (36.8 C)   Ht 5\' 3"  (1.6 m)   Wt 132 lb 9.6 oz (60.1 kg)   BMI 23.49 kg/m  General: no acute distress , A&Ox3, moves to table unassisted HEENT: PEERL, conjunctiva normal, Oropharynx dry mmm,neck is supple, no  lad Cardiovascular:  RRR with 2/6 systolic murmur Respiratory:  Good breath sounds bilaterally, CTAB with normal respiratory effort Gastrointestinal: soft, flat abdomen, hyperactive bowel sounds, no palpable masses, nontender, no hepatosplenomegaly, no appreciated hernias Skin:  Warm, no rashes Orthostatic bp's and pulses: negative  Assessment  1. Colitis   2. Lightheadedness   3. Diarrhea of presumed infectious origin      Plan   Recurrent colitis:  Collagenous vs infectious. Dehydration. Has appt scheduled with Dr. Celesta Aver office in 2 weeks. Start oral pred 20 daily x 10 days. Check labs and stool studies. Hydrate orally. immodium as needed. F/u if worsening.   Follow up: Return if symptoms worsen or fail to improve.    Commons side effects, risks, benefits, and alternatives for medications and treatment plan prescribed today were discussed, and the patient expressed understanding of the given instructions. Patient is instructed to call or message via MyChart if he/she has any questions or concerns regarding our treatment plan. No barriers to understanding were identified. We discussed Red Flag symptoms and signs in detail. Patient expressed understanding regarding what to do in case of urgent or emergency type symptoms.   Medication list was reconciled, printed and provided to the patient in AVS. Patient instructions and summary information was reviewed with the patient as documented in the AVS. This note was prepared with assistance of Dragon voice recognition software. Occasional wrong-word or sound-a-like substitutions may have occurred due to the inherent limitations of voice recognition software  Orders Placed This Encounter  Procedures  . Stool culture  . Sedimentation rate  . Comprehensive metabolic panel  . CBC with Differential/Platelet  . Clostridium difficile Toxin B, Qualitative, Real-Time PCR  . Stool, WBC/Lactoferrin   Meds ordered this encounter  Medications  .  predniSONE (DELTASONE) 20 MG tablet    Sig: Take 1 tablet (20 mg total) by mouth daily with breakfast.    Dispense:  10 tablet    Refill:  0

## 2017-05-03 NOTE — Telephone Encounter (Signed)
Copied from [place]. Topic: Quick Communication - Rx Refill/Question >> May 03, 2017 11:57 AM Mcneil, Ja-Kwan wrote: Medication: predniSONE (DELTASONE) 20 MG tablet  Pt Rx was sent to wrong pharmacy. Request that it be sent to below pharmacy. Has the patient contacted their pharmacy? yes  Preferred Pharmacy (with phone number or street name): [facility] [address]  [phone]

## 2017-05-03 NOTE — Patient Instructions (Addendum)
Please increase fluids and follow up if symptoms do not improve or as needed.  Call with any worsening diarrhea or abdominal pain.  Follow-up with the GI Doctor as scheduled.  Please go to the Lab for blood work.    If you have MyChart, your results will be available to view, please respond through [place] with questions.

## 2017-05-03 NOTE — Addendum Note (Signed)
Addended by: Katina Dung on: 05/03/2017 01:43 PM   Modules accepted: Orders

## 2017-05-03 NOTE — Telephone Encounter (Signed)
Pharmacy has been corrected.

## 2017-05-10 LAB — STOOL CULTURE
MICRO NUMBER:: 90553873
MICRO NUMBER:: 90553874
MICRO NUMBER:: 90553876
SHIGA RESULT:: NOT DETECTED
SPECIMEN QUALITY:: ADEQUATE
SPECIMEN QUALITY:: ADEQUATE
SPECIMEN QUALITY:: ADEQUATE

## 2017-05-10 LAB — CLOSTRIDIUM DIFFICILE TOXIN B, QUALITATIVE, REAL-TIME PCR: Toxigenic C. Difficile by PCR: NOT DETECTED

## 2017-05-10 LAB — FECAL LACTOFERRIN, QUANT
Fecal Lactoferrin: POSITIVE — AB
MICRO NUMBER:: 90553875
SPECIMEN QUALITY:: ADEQUATE

## 2017-05-14 DIAGNOSIS — R69 Illness, unspecified: Secondary | ICD-10-CM | POA: Diagnosis not present

## 2017-05-15 ENCOUNTER — Ambulatory Visit: Payer: Medicare HMO | Admitting: Physician Assistant

## 2017-05-15 ENCOUNTER — Encounter: Payer: Self-pay | Admitting: Physician Assistant

## 2017-05-15 VITALS — BP 120/72 | HR 74 | Ht 62.0 in | Wt 132.0 lb

## 2017-05-15 DIAGNOSIS — K52839 Microscopic colitis, unspecified: Secondary | ICD-10-CM | POA: Diagnosis not present

## 2017-05-15 DIAGNOSIS — R197 Diarrhea, unspecified: Secondary | ICD-10-CM | POA: Diagnosis not present

## 2017-05-15 MED ORDER — CHOLESTYRAMINE 4 G PO PACK: PACK | ORAL | 3 refills | Status: DC

## 2017-05-15 NOTE — Progress Notes (Addendum)
Subjective:    Patient ID: Beth Wise, female    DOB: 1925-07-10, 84 y.o.   MRN: [id number]  HPI [name] is a very nice 84 year old white female, resident at [facility] and known to Dr. Carlean Purl.  She is referred back today by Dr. Billey Chang for evaluation of diarrhea. Patient was last seen in our office in 2017 and had undergone EGD at that time showing diffuse moderate gastritis.  Her last sigmoid exam was done in July 2012 for history of collagenous colitis.  Exam was negative other than mild diverticulosis but biopsies were positive for collagenous colitis. Other medical problems include hypertension, congestive heart failure, GERD spinal stenosis and aortic valve disease. Patient states that she had not had any problems with colitis type symptoms over the past several years.  She started having her current symptoms about 3 weeks ago initially with the change in her bowel habits and then onset of diarrhea with 8 or more bowel movements per day.  She is also been having nocturnal diarrhea eating up at least once per night.  Stools have been watery to liquid and nonbloody.  She has had some nausea and some pressure type of discomfort in her upper abdomen no vomiting.  No fever or chills.  Appetite somewhat decreased.  She was placed on a brat diet which she continues to follow. Labs were done on 05/03/2017; hemoglobin 10.4 hematocrit of 30.9 WBC of 5.3, sed rate 18 C. difficile toxin be negative Stool for lactoferrin was positive and routine stool culture was negative. She was started on a course of prednisone 20 mg p.o. daily for 10 days.  She did not have any significant improvement in symptoms.  She is also been taking Imodium on a regular basis several per day without much benefit.  She does not recall any recent antibiotics. Today so far she is just had one diarrheal stool.  Yesterday she believes she had 6 bowel movements. She has been taking naproxen as needed, and is on omeprazole 20 mg  p.o. daily.  She says she has tried to come off of PPI therapy and was placed on Pepcid at one point but had recurrence fairly quickly of GERD.  Review of Systems Pertinent positive and negative review of systems were noted in the above HPI section.  All other review of systems was otherwise negative.  Outpatient Encounter Medications as of 05/15/2017  Medication Sig  . acetaminophen (TYLENOL) 325 MG tablet Take 650 mg by mouth every 6 (six) hours as needed.  Marland Kitchen BIOTIN PO Take 5,000 mcg by mouth daily.   . Cholecalciferol (VITAMIN D) 2000 units CAPS Take 1 capsule by mouth. 3 times per week.  . estradiol (ESTRACE) 0.1 MG/GM vaginal cream Place 1 Applicatorful vaginally at bedtime.  . flurazepam (DALMANE) 15 MG capsule Take 15 mg by mouth. 2-3 times a week.  . fluticasone (FLONASE) 50 MCG/ACT nasal spray Place 1 spray into the nose as needed. Sinus drainage  . GuaiFENesin (MUCINEX PO) Take 1 tablet by mouth every 12 (twelve) hours as needed (congestion).   Marland Kitchen ipratropium (ATROVENT) 0.03 % nasal spray As directed nasal spray  . naproxen sodium (ANAPROX) 220 MG tablet Take 220 mg daily as needed by mouth.   . neomycin-polymyxin-hydrocortisone (CORTISPORIN) 3.5-10000-0.5 cream Apply to lids daily as needed.  Marland Kitchen omeprazole (PRILOSEC) 20 MG capsule Take 20 mg daily as needed by mouth (for acid reflux).   [name] Glycol-Propyl Glycol (SYSTANE OP) Apply to eye.  . cholestyramine Lucrezia Starch)  4 g packet Take 1 packet in 6 oz of water or juice twice daily between meals.  . [DISCONTINUED] predniSONE (DELTASONE) 20 MG tablet Take 1 tablet (20 mg total) by mouth daily with breakfast. (Patient not taking: Reported on 05/15/2017)   No facility-administered encounter medications on file as of 05/15/2017.    Allergies  Allergen Reactions  . Actonel [Risedronate Sodium] Other (See Comments)    Joint pain  . Amoxicillin Diarrhea  . Erythromycin Other (See Comments)    Gi upset  . Lipitor [Atorvastatin  Calcium]   . Zetia [Ezetimibe]     Joint pain   Patient Active Problem List   Diagnosis Date Noted  . Primary hyperparathyroidism ([place]) 03/11/2017  . Primary insomnia 11/26/2016  . Spinal stenosis of lumbar region without neurogenic claudication 11/26/2016  . Benign essential hypertension 11/26/2016  . Senile osteoporosis 11/26/2016  . Thoracogenic scoliosis of thoracolumbar region 11/26/2016  . Dry mouth 11/26/2016  . Postmenopause atrophic vaginitis 11/26/2016  . Chronic diastolic CHF (congestive heart failure), NYHA class 1 ([place]) 11/26/2016  . Rectocele 08/30/2016  . Mitral regurgitation 02/22/2011  . GERD 04/03/2007  . Collagenous colitis 04/03/2007   Social History   Socioeconomic History  . Marital status: Widowed    Spouse name: Not on file  . Number of children: 2  . Years of education: Not on file  . Highest education level: Not on file  Occupational History  . Occupation: retired    Comment: owned Human resources officer  Social Needs  . Financial resource strain: Not hard at all  . Food insecurity:    Worry: Never true    Inability: Never true  . Transportation needs:    Medical: No    Non-medical: No  Tobacco Use  . Smoking status: Never Smoker  . Smokeless tobacco: Never Used  Substance and Sexual Activity  . Alcohol use: Yes    Comment: rarely  . Drug use: No  . Sexual activity: Never  Lifestyle  . Physical activity:    Days per week: 7 days    Minutes per session: 10 min  . Stress: Only a little  Relationships  . Social connections:    Talks on phone: More than three times a week    Gets together: More than three times a week    Attends religious service: 1 to 4 times per year    Active member of club or organization: Yes    Attends meetings of clubs or organizations: Never    Relationship status: Widowed  . Intimate partner violence:    Fear of current or ex partner: No    Emotionally abused: No    Physically abused: No    Forced sexual activity: No    Other Topics Concern  . Not on file  Social History Narrative   Social History      Diet? Regular (shouldn't have excessive amts of acid, hot &spicey foods)      Do you drink/eat things with caffeine? yes      Marital status?       widow                             What year were you married? [place] you live in a house, apartment, assisted living, condo, trailer, etc.? apartment      Is it one or more stories? 1      How  many persons live in your home? 1 (self)      Do you have any pets in your home? (please list) no no !!      Highest level of education completed?      Current or past profession: hairdresser      Do you exercise?      little                                Type & how often? Leg lifts and plank, stretching every day.      Advanced Directives      Do you have a living will?  yes      Do you have a DNR form?         yes                         If not, do you want to discuss one? yes      Do you have signed POA/HPOA for forms? yes      Functional Status      Do you have difficulty bathing or dressing yourself?      Do you have difficulty preparing food or eating?       Do you have difficulty managing your medications?      Do you have difficulty managing your finances?      Do you have difficulty affording your medications?    Ms. Bruski family history includes Breast cancer in her mother; Cancer - Other in her sister; Cirrhosis in her sister; Colitis in her sister; Heart disease in her brother, brother, father, and sister; Pneumonia in her sister; Stroke in her mother.      Objective:    Vitals:   05/15/17 1403  BP: 120/72  Pulse: 74    Physical Exam; well-developed elderly white female in no acute distress, very pleasant appears somewhat younger than stated age.  Blood pressure 120/72 pulse 74, height 5 foot 2, weight 132, BMI 24.1.  HEENT ;nontraumatic normocephalic EOMI PERRLA sclera anicteric oropharynx clear, Cardiovascular;  regular rate and rhythm with S1-S2 no murmur rub or gallop, Pulmonary; clear bilaterally, Abdomen ;soft, only tender bilateral lower quadrants no guarding or rebound, no palpable mass or hepatosplenomegaly bowel sounds are hyperactive, Rectal ;exam not done extremities no clubbing cyanosis or edema skin warm and dry, Neuro psych; alert and oriented, grossly nonfocal mood and affect appropriate       Assessment & Plan:   #29 84 year old white female with previous history of collagenous colitis which had been inactive over the past 6 years with recurrence of diarrhea about 3 weeks ago with multiple bowel movements per day liquid to watery nonbloody stool.  She is had some mild nausea and pressure type discomfort in her upper abdomen no significant abdominal pain or cramping, no fever. Initial infectious work-up as outlined above with routine stool culture stool for C. difficile were negative, sed rate within normal limits. She has completed a 2-week course of prednisone 20 mg p.o. daily with no significant improvement in symptoms. I suspect she has had a recurrence of collagenous colitis, will repeat stool for C. difficile PCR to be complete, and prior to retreating with steroids  #2 history of spinal stenosis #3 Aortic valve disease #4 GERD chronic on PPI therapy #5Congestive heart failure #6 Hypertension  Plan; stool for C. difficile PCR Restart Florastor 1 p.o. twice daily Start  Questran 4 g p.o. twice daily between meals and 2 hours away from other meds If stool for C. difficile PCR results negative, will restart prednisone, and will need a higher dose.  She says she did have some difficulty sleeping even on 20 mg p.o. every morning.  May start with 30 mg p.o. daily and do a long slow taper. As she did not have significant response to a short course of prednisone I do not think budesonide would be a good option for her initially She was asked to stop naproxen as this can exacerbate  microscopic colitis.  She is also on PPI therapy which has been associated.  However she has been on PPI long-term and does not do well with H2 blockers. She already has follow-up scheduled with Dr. Carlean Purl in about 6 weeks.   Kemyra August S Eston Heslin PA-C 05/15/2017   Cc: Leamon Arnt, MD  Agree with Ms. Genia Harold assessment and plan. Gatha Mayer, MD, [facility]

## 2017-05-15 NOTE — Patient Instructions (Signed)
Your provider has requested that you go to the basement level for lab work before leaving today. Press "B" on the elevator. The lab is located at the first door on the left as you exit the elevator.  Stop Naproxen. We sent a prescription to [facility].  1. Questran packets. - take 1 packet in 6 ounces of water or juice twice daily between meals.   If you are age 82 or older, your body mass index should be between 23-30. Your Body mass index is 24.14 kg/m. If this is out of the aforementioned range listed, please consider follow up with your Primary Care Provider.

## 2017-05-16 ENCOUNTER — Other Ambulatory Visit: Payer: Medicare HMO

## 2017-05-16 DIAGNOSIS — R197 Diarrhea, unspecified: Secondary | ICD-10-CM | POA: Diagnosis not present

## 2017-05-16 DIAGNOSIS — K52839 Microscopic colitis, unspecified: Secondary | ICD-10-CM

## 2017-05-17 DIAGNOSIS — Z85828 Personal history of other malignant neoplasm of skin: Secondary | ICD-10-CM | POA: Diagnosis not present

## 2017-05-17 DIAGNOSIS — L298 Other pruritus: Secondary | ICD-10-CM | POA: Diagnosis not present

## 2017-05-17 DIAGNOSIS — L570 Actinic keratosis: Secondary | ICD-10-CM | POA: Diagnosis not present

## 2017-05-17 LAB — CLOSTRIDIUM DIFFICILE TOXIN B, QUALITATIVE, REAL-TIME PCR: Toxigenic C. Difficile by PCR: NOT DETECTED

## 2017-05-20 ENCOUNTER — Telehealth: Payer: Self-pay | Admitting: Physician Assistant

## 2017-05-20 NOTE — Telephone Encounter (Signed)
Patient was concerned the Omeprazole was contributing to her GI symptoms of diarrhea and nausea. She skipped Omeprazole today. States she has not had the nausea she was experiencing. Only one bowel movement today. She would like to not take Omeprazole for a few more days to determine if it was "part of the problem". She asks about the option of Zantac to "have on hand if I need it." Please advise.

## 2017-05-21 ENCOUNTER — Other Ambulatory Visit: Payer: Self-pay

## 2017-05-21 ENCOUNTER — Telehealth: Payer: Self-pay | Admitting: Physician Assistant

## 2017-05-21 MED ORDER — RANITIDINE HCL 150 MG PO TABS: 150.0000 mg | ORAL_TABLET | Freq: Two times a day (BID) | ORAL | 6 refills | Status: DC

## 2017-05-21 NOTE — Telephone Encounter (Signed)
Yes , that is fine -can stop omeprazole and can send rx for Zantac 150 po BID #60 /3

## 2017-05-21 NOTE — Telephone Encounter (Signed)
Pt is requesting zantac to be sent to [name] [address].

## 2017-05-21 NOTE — Telephone Encounter (Signed)
On 05-20-17 [facility] LPN sent script for the Zantac to [place], same as [address]

## 2017-05-21 NOTE — Telephone Encounter (Signed)
Rx sent to the [place] at [facility].

## 2017-06-11 DIAGNOSIS — K589 Irritable bowel syndrome without diarrhea: Secondary | ICD-10-CM | POA: Diagnosis not present

## 2017-06-13 ENCOUNTER — Telehealth: Payer: Self-pay | Admitting: Family Medicine

## 2017-06-13 DIAGNOSIS — M545 Low back pain: Secondary | ICD-10-CM | POA: Diagnosis not present

## 2017-06-13 DIAGNOSIS — M419 Scoliosis, unspecified: Secondary | ICD-10-CM | POA: Diagnosis not present

## 2017-06-13 NOTE — Telephone Encounter (Signed)
Copied from [place] [phone]. Topic: Quick Communication - See Telephone Encounter >> Jun 13, 2017  4:54 PM Cleaster Corin, NT wrote: CRM for notification. See Telephone encounter for: 06/13/17.  Pt. Requesting something to take for energy for therapy(reccommended from spine specialist) (possible iron, B12?) [phone]

## 2017-06-14 NOTE — Telephone Encounter (Signed)
Please advise 

## 2017-06-14 NOTE — Telephone Encounter (Signed)
Patient is calling to follow up on this. Please advise.  °

## 2017-06-14 NOTE — Telephone Encounter (Signed)
Called pt and she advised that she has been feeling fatigued and weak since her last visit on 05/03/17. She states that she was seen by her spinal specialist who recommended she start physical therapy. However, pt has since canceled PT until she has more energy to actually receive full benefits from it.   Pt advised that her BP yesterday was 90/58, denies any shortness of breath, dizziness, headaches. Due to the ongoing weak feeling and BP concerns pt was made an appt for Monday with Dr. Jonni Sanger.

## 2017-06-17 ENCOUNTER — Encounter: Payer: Self-pay | Admitting: Family Medicine

## 2017-06-17 ENCOUNTER — Other Ambulatory Visit: Payer: Self-pay

## 2017-06-17 ENCOUNTER — Ambulatory Visit (INDEPENDENT_AMBULATORY_CARE_PROVIDER_SITE_OTHER): Payer: Medicare HMO | Admitting: Family Medicine

## 2017-06-17 VITALS — BP 128/68 | HR 74 | Temp 98.1°F | Ht 62.0 in | Wt 128.4 lb

## 2017-06-17 DIAGNOSIS — K52831 Collagenous colitis: Secondary | ICD-10-CM | POA: Diagnosis not present

## 2017-06-17 DIAGNOSIS — R0600 Dyspnea, unspecified: Secondary | ICD-10-CM

## 2017-06-17 DIAGNOSIS — R0989 Other specified symptoms and signs involving the circulatory and respiratory systems: Secondary | ICD-10-CM | POA: Diagnosis not present

## 2017-06-17 DIAGNOSIS — I5032 Chronic diastolic (congestive) heart failure: Secondary | ICD-10-CM

## 2017-06-17 DIAGNOSIS — R5383 Other fatigue: Secondary | ICD-10-CM

## 2017-06-17 DIAGNOSIS — R0609 Other forms of dyspnea: Secondary | ICD-10-CM | POA: Diagnosis not present

## 2017-06-17 NOTE — Progress Notes (Signed)
Subjective  CC:  Chief Complaint  Patient presents with  . Fatigue    patient states that she has no energy and just weak   . Shortness of Breath    with walking     HPI: Beth Wise is a 84 y.o. female who presents to the office today to address the problems listed above in the chief complaint.  Beth Wise presents due to persistent and worsening fatigue associated with dyspnea on exertion.  Over the last 1 to 2 months she has been suffering from reoccurring presumed collagenous colitis.  She has seen GI and I reviewed their notes.  She has been treated with prednisone with minimal improvement.  She describes multiple loose stools daily.  Appetite is fair.  However, she feels that she has less energy.  She denies orthopnea, shortness of breath at rest, chest pain, palpitations, lower extremity edema.  She does see a cardiologist for her mitral valve disease and stage I diastolic heart failure.  She did have one episode of low blood pressure at her recent doctor's visit but no symptoms of lightheadedness since.  She is not on an antihypertensive.  She does have worsening anemia that could possibly be coming from the GI tract.  She has a history of gastritis in the past.  She denies melena or blood in the stool.  Assessment  1. Dyspnea on exertion   2. Fatigue, unspecified type   3. Chronic diastolic CHF (congestive heart failure), NYHA class 1 ([place])   4. Collagenous colitis   5. Pulse irregularity      Plan   Fatigue and dyspnea, multifactorial: She has multiple possible etiologies including worsening anemia, dehydration and fatigue due to chronic diarrhea, hypotension due to autonomic dysfunction and/or dehydration, or other etiologies.  Check blood work.  She has follow-up with GI.  She may warrant another colonoscopy and/or EGD.  Will assess for anemia and thyroid function.  No signs of active heart failure as etiology, however she may be mildly dehydrated as her pulse became  tachycardic with standing.  Recommend good nutrition and hydration.  Close follow-up recommended  Follow up: Return if symptoms worsen or fail to improve.   Orders Placed This Encounter  Procedures  . CBC with Differential/Platelet  . Iron, TIBC and Ferritin Panel  . Vitamin B12  . Folate RBC  . Comprehensive metabolic panel  . TSH  . EKG 12-Lead   No orders of the defined types were placed in this encounter.     I reviewed the patients updated PMH, FH, and SocHx.    Patient Active Problem List   Diagnosis Date Noted  . Spinal stenosis of lumbar region without neurogenic claudication 11/26/2016    Priority: High  . Benign essential hypertension 11/26/2016    Priority: High  . Senile osteoporosis 11/26/2016    Priority: High  . Chronic diastolic CHF (congestive heart failure), NYHA class 1 ([place]) 11/26/2016    Priority: High  . GERD 04/03/2007    Priority: High  . Primary hyperparathyroidism ([place]) 03/11/2017    Priority: Medium  . Primary insomnia 11/26/2016    Priority: Medium  . Thoracogenic scoliosis of thoracolumbar region 11/26/2016    Priority: Medium  . Postmenopause atrophic vaginitis 11/26/2016    Priority: Medium  . Rectocele 08/30/2016    Priority: Medium  . Mitral regurgitation 02/22/2011    Priority: Low  . Dry mouth 11/26/2016  . Collagenous colitis 04/03/2007   Current Meds  Medication Sig  .  acetaminophen (TYLENOL) 325 MG tablet Take 650 mg by mouth every 6 (six) hours as needed.  Marland Kitchen BIOTIN PO Take 5,000 mcg by mouth daily.   Marland Kitchen estradiol (ESTRACE) 0.1 MG/GM vaginal cream Place 1 Applicatorful vaginally at bedtime.  . flurazepam (DALMANE) 15 MG capsule Take 15 mg by mouth. 2-3 times a week.  . fluticasone (FLONASE) 50 MCG/ACT nasal spray Place 1 spray into the nose as needed. Sinus drainage  . GuaiFENesin (MUCINEX PO) Take 1 tablet by mouth every 12 (twelve) hours as needed (congestion).   Marland Kitchen ipratropium (ATROVENT) 0.03 % nasal spray As directed nasal  spray  . Polyethyl Glycol-Propyl Glycol (SYSTANE OP) Apply to eye.  . ranitidine (ZANTAC) 150 MG tablet Take 1 tablet (150 mg total) by mouth 2 (two) times daily. Replaces Omeprazole ( Prilosec)  . [DISCONTINUED] Cholecalciferol (VITAMIN D) 2000 units CAPS Take 1 capsule by mouth. 3 times per week.  . [DISCONTINUED] omeprazole (PRILOSEC) 20 MG capsule Take 20 mg daily as needed by mouth (for acid reflux).     Allergies: Patient is allergic to actonel [risedronate sodium]; amoxicillin; erythromycin; lipitor [atorvastatin calcium]; and zetia [ezetimibe]. Family History: Patient family history includes Breast cancer in her mother; Cancer - Other in her sister; Cirrhosis in her sister; Colitis in her sister; Heart disease in her brother, brother, father, and sister; Pneumonia in her sister; Stroke in her mother. Social History:  Patient  reports that she has never smoked. She has never used smokeless tobacco. She reports that she drinks alcohol. She reports that she does not use drugs.  Review of Systems: Constitutional: Negative for fever malaise or anorexia Cardiovascular: negative for chest pain Respiratory: negative for SOB or persistent cough Gastrointestinal: negative for abdominal pain  Objective  Vitals: BP 128/68   Pulse 74   Temp 98.1 F (36.7 C)   Ht 5\' 2"  (1.575 m)   Wt 128 lb 6.4 oz (58.2 kg)   SpO2 96%   BMI 23.48 kg/m  General: no acute distress , A&Ox3 HEENT: PEERL, conjunctiva normal, Oropharynx moist,neck is supple Cardiovascular:  RRR systolic murmur present.  Occasional extra beats audible, Respiratory:  Good breath sounds bilaterally, CTAB with normal respiratory effort Abdominal exam is benign, no tenderness or masses palpated Skin:  Warm, no rashes Orthostatic blood pressures show normal blood pressures, pulse increases from 89-112 upon standing.  Patient denies symptoms of dizziness EKG shows normal sinus rhythm without acute changes.   Commons side  effects, risks, benefits, and alternatives for medications and treatment plan prescribed today were discussed, and the patient expressed understanding of the given instructions. Patient is instructed to call or message via MyChart if he/she has any questions or concerns regarding our treatment plan. No barriers to understanding were identified. We discussed Red Flag symptoms and signs in detail. Patient expressed understanding regarding what to do in case of urgent or emergency type symptoms.   Medication list was reconciled, printed and provided to the patient in AVS. Patient instructions and summary information was reviewed with the patient as documented in the AVS. This note was prepared with assistance of Dragon voice recognition software. Occasional wrong-word or sound-a-like substitutions may have occurred due to the inherent limitations of voice recognition software

## 2017-06-17 NOTE — Patient Instructions (Signed)
Please follow up if symptoms do not improve or as needed.   I will let you know about your test results.   Take it easy. Drink and eat plenty.  Follow up with Dr. Carlean Purl.

## 2017-06-18 ENCOUNTER — Telehealth: Payer: Self-pay | Admitting: Family Medicine

## 2017-06-18 ENCOUNTER — Encounter: Payer: Self-pay | Admitting: Family Medicine

## 2017-06-18 LAB — COMPREHENSIVE METABOLIC PANEL
ALT: 8 U/L (ref 0–35)
AST: 15 U/L (ref 0–37)
Albumin: 4.1 g/dL (ref 3.5–5.2)
Alkaline Phosphatase: 28 U/L — ABNORMAL LOW (ref 39–117)
BUN: 15 mg/dL (ref 6–23)
CO2: 25 mEq/L (ref 19–32)
Calcium: 10.1 mg/dL (ref 8.4–10.5)
Chloride: 104 mEq/L (ref 96–112)
Creatinine, Ser: 0.98 mg/dL (ref 0.40–1.20)
GFR: 56.69 mL/min — ABNORMAL LOW (ref >60.00)
Glucose, Bld: 92 mg/dL (ref 70–99)
Potassium: 3.7 mEq/L (ref 3.5–5.1)
Sodium: 136 mEq/L (ref 135–145)
Total Bilirubin: 0.3 mg/dL (ref 0.2–1.2)
Total Protein: 6.5 g/dL (ref 6.0–8.3)

## 2017-06-18 LAB — CBC WITH DIFFERENTIAL/PLATELET
Basophils Absolute: 0 10*3/uL (ref 0.0–0.1)
Basophils Relative: 0.8 % (ref 0.0–3.0)
Eosinophils Absolute: 0 10*3/uL (ref 0.0–0.7)
Eosinophils Relative: 1.1 % (ref 0.0–5.0)
HCT: 29.1 % — ABNORMAL LOW (ref 36.0–46.0)
Hemoglobin: 9.9 g/dL — ABNORMAL LOW (ref 12.0–15.0)
Lymphocytes Relative: 30.2 % (ref 12.0–46.0)
Lymphs Abs: 1.4 10*3/uL (ref 0.7–4.0)
MCHC: 34.1 g/dL (ref 30.0–36.0)
MCV: 93.5 fl (ref 78.0–100.0)
Monocytes Absolute: 0.7 10*3/uL (ref 0.1–1.0)
Monocytes Relative: 14.6 % — ABNORMAL HIGH (ref 3.0–12.0)
Neutro Abs: 2.5 10*3/uL (ref 1.4–7.7)
Neutrophils Relative %: 53.3 % (ref 43.0–77.0)
Platelets: 213 10*3/uL (ref 150.0–400.0)
RBC: 3.12 Mil/uL — ABNORMAL LOW (ref 3.87–5.11)
RDW: 14.2 % (ref 11.5–15.5)
WBC: 4.7 10*3/uL (ref 4.0–10.5)

## 2017-06-18 LAB — TSH: TSH: 0.95 u[IU]/mL (ref 0.35–4.50)

## 2017-06-18 LAB — IRON,TIBC AND FERRITIN PANEL
%SAT: 13 % (calc) — ABNORMAL LOW (ref 16–45)
Ferritin: 649 ng/mL — ABNORMAL HIGH (ref 16–288)
Iron: 31 ug/dL — ABNORMAL LOW (ref 45–160)
TIBC: 244 mcg/dL (calc) — ABNORMAL LOW (ref 250–450)

## 2017-06-18 LAB — VITAMIN B12: Vitamin B-12: 225 pg/mL (ref 211–911)

## 2017-06-18 LAB — FOLATE RBC: RBC Folate: 949 ng/mL RBC (ref >280)

## 2017-06-18 NOTE — Telephone Encounter (Signed)
Copied from [place] [id number]. Topic: Quick Communication - Lab Results >> Jun 18, 2017  1:55 PM Fagge, Judith Part, CMA wrote: Called patient to inform them of lab results. When patient returns call, triage nurse may disclose results.  Please call patient: labs reveal a worsening iron deficiency anemia and a low normal vit B12 level. This could be contributing to her fatigue. I have routed this information to Dr. Carlean Purl.   >> Jun 18, 2017  4:53 PM Marin Olp L wrote: No [facility] RN free for results.

## 2017-06-18 NOTE — Telephone Encounter (Signed)
Copied from [place] [id number]. Topic: Quick Communication - Lab Results >> Jun 18, 2017  1:55 PM Fagge, Judith Part, CMA wrote: Called patient to inform them of lab results. When patient returns call, triage nurse may disclose results.  Please call patient: labs reveal a worsening iron deficiency anemia and a low normal vit B12 level. This could be contributing to her fatigue. I have routed this information to Dr. Carlean Purl.   >> Jun 18, 2017  4:53 PM Marin Olp L wrote: No [facility] RN free for results. Prefers call back around 7pm today.

## 2017-06-18 NOTE — Telephone Encounter (Deleted)
Copied from [place] [id number]. Topic: Quick Communication - Lab Results >> Jun 18, 2017  1:55 PM Fagge, Beth Wise, Beth Wise wrote: Called patient to inform them of lab results. When patient returns call, triage nurse may disclose results.  Please call patient: labs reveal a worsening iron deficiency anemia and a low normal vit B12 level. This could be contributing to her fatigue. I have routed this information to Dr. Carlean Purl.

## 2017-06-19 ENCOUNTER — Telehealth: Payer: Self-pay | Admitting: Family Medicine

## 2017-06-19 ENCOUNTER — Encounter: Payer: Self-pay | Admitting: Family Medicine

## 2017-06-19 NOTE — Telephone Encounter (Signed)
Patient is very concerned about the cause of her anemia- she wants to get to the bottom of the problem as soon as possible. She is not feeling well and she wants to feel better- she has to get ready for her birthday. She states a message may be left on her voice mail if needed.

## 2017-06-19 NOTE — Progress Notes (Signed)
Lab review indicates to me she has anemia of chronic disease - iron sat low but so is TIBC and also has high ferritin  I would ask that she get B12 injection 100 uq x 1 and consider doing that every 1-3 mos at PCP office if they can  We will call her from office to see how her diarrhea is doing (from collagenous colitis) - and go ahead and set up an OV me - next available but if not feeling well will try to get her in sooner

## 2017-06-19 NOTE — Telephone Encounter (Signed)
Charted in results notes.

## 2017-06-20 ENCOUNTER — Other Ambulatory Visit: Payer: Self-pay | Admitting: Internal Medicine

## 2017-06-20 ENCOUNTER — Telehealth: Payer: Self-pay | Admitting: Internal Medicine

## 2017-06-20 DIAGNOSIS — K52831 Collagenous colitis: Secondary | ICD-10-CM

## 2017-06-20 MED ORDER — BUDESONIDE 3 MG PO CPEP: 9.0000 mg | ORAL_CAPSULE | Freq: Every day | ORAL | 2 refills | Status: DC

## 2017-06-20 MED ORDER — DIPHENOXYLATE-ATROPINE 2.5-0.025 MG PO TABS: 1.0000 | ORAL_TABLET | Freq: Four times a day (QID) | ORAL | 0 refills | Status: DC | PRN

## 2017-06-20 MED ORDER — BUDESONIDE 3 MG PO CPEP
9.0000 mg | ORAL_CAPSULE | Freq: Every day | ORAL | 2 refills | Status: DC
Start: 2017-06-20 — End: 2017-07-09

## 2017-06-20 NOTE — Progress Notes (Signed)
I called her and re-explained collagenous colitis (microscopic also)  Plan  Lomotil 1 q 6 prn # 60 - explained for sx relief (loperamide not effective) Budesonide 9 mg daily 3 mg caps # 90 2 RF - hopefully she can afford we used prednisone in past due to cost/coverage - I told her to call me if too expensive She sees me 7/1  Understand that she does not have a classic chronic disease but elevated ferritin and low iron, low TIC and low sat are the pattern of anemia of chronic disease and not Fe defic  I appreciate the previous messages and our ability to work together electronically

## 2017-06-20 NOTE — Telephone Encounter (Signed)
Spoke with patient regarding lab results and b12 injections. Appt made for tomorrow. 06/21/17

## 2017-06-20 NOTE — Addendum Note (Signed)
Addended by: Marlon Pel on: 06/20/2017 05:13 PM   Modules accepted: Orders

## 2017-06-20 NOTE — Telephone Encounter (Signed)
Patient returned phone call. She is also wanting our office to know that she now uses [name] [address].

## 2017-06-20 NOTE — Telephone Encounter (Signed)
rx sent to [facility]

## 2017-06-20 NOTE — Telephone Encounter (Signed)
I left a detailed message for the patient that there is no diet.  She is advised to call back for additional details or questions.

## 2017-06-21 ENCOUNTER — Ambulatory Visit (INDEPENDENT_AMBULATORY_CARE_PROVIDER_SITE_OTHER): Payer: Medicare HMO | Admitting: Emergency Medicine

## 2017-06-21 DIAGNOSIS — R5383 Other fatigue: Secondary | ICD-10-CM | POA: Diagnosis not present

## 2017-06-21 MED ORDER — CYANOCOBALAMIN 1000 MCG/ML IJ SOLN
1000.0000 ug | Freq: Once | INTRAMUSCULAR | Status: AC
  Administered 2017-06-21: 1000 ug via INTRAMUSCULAR

## 2017-06-24 ENCOUNTER — Encounter: Payer: Self-pay | Admitting: Family Medicine

## 2017-06-24 DIAGNOSIS — K589 Irritable bowel syndrome without diarrhea: Secondary | ICD-10-CM | POA: Diagnosis not present

## 2017-06-26 DIAGNOSIS — K589 Irritable bowel syndrome without diarrhea: Secondary | ICD-10-CM | POA: Diagnosis not present

## 2017-06-28 ENCOUNTER — Encounter (HOSPITAL_COMMUNITY): Payer: Self-pay | Admitting: Radiology

## 2017-07-01 ENCOUNTER — Ambulatory Visit: Payer: Medicare HMO | Admitting: Internal Medicine

## 2017-07-01 ENCOUNTER — Encounter: Payer: Self-pay | Admitting: Internal Medicine

## 2017-07-01 DIAGNOSIS — K52831 Collagenous colitis: Secondary | ICD-10-CM

## 2017-07-01 NOTE — Patient Instructions (Signed)
  Message Dr Carlean Purl at the end of your bottle of Budesonide that your currently taking.    Continue to taper your Lomotil as your doing.   Sign up for the [facility] and let us know when to send it.    I appreciate the opportunity to care for you. Silvano Rusk, MD, [facility]

## 2017-07-01 NOTE — Assessment & Plan Note (Addendum)
Budesonide chronically - will find lowest dose that keeps in remission Side effects of steroids reviewed and she understands and accepts risks Taper Lomotil  Will try to get budesonide from [facility] at 1/3 her cost here She will message me at end of this bottle with how she is at 6 mg

## 2017-07-01 NOTE — Progress Notes (Signed)
Beth Wise 84 y.o. 1925/05/14 [id number]  Assessment & Plan:  Collagenous colitis Budesonide chronically - will find lowest dose that keeps in remission Side effects of steroids reviewed and she understands and accepts risks Taper Lomotil  Will try to get budesonide from [facility] at 1/3 her cost here She will message me at end of this bottle with how she is at 6 mg   Turns 89 next month  I appreciate the opportunity to care for her Cc:Leamon Arnt, MD    Subjective:   Chief Complaint: collagenous colitis  HPI Has had a flare - Rx short course of prednisone +/- results but since I started budesonide 9 mg has done well.  Her cost was $320. Says she can do that but if cheaper better.   Allergies  Allergen Reactions  . Actonel [Risedronate Sodium] Other (See Comments)    Joint pain  . Amoxicillin Diarrhea  . Erythromycin Other (See Comments)    Gi upset  . Lipitor [Atorvastatin Calcium]   . Zetia [Ezetimibe]     Joint pain   Current Meds  Medication Sig  . acetaminophen (TYLENOL) 500 MG tablet Take 500 mg by mouth every 6 (six) hours as needed.  Marland Kitchen BIOTIN PO Take 5,000 mcg by mouth daily.   . budesonide (ENTOCORT EC) 3 MG 24 hr capsule Take 3 capsules (9 mg total) by mouth daily.  . diphenoxylate-atropine (LOMOTIL) 2.5-0.025 MG tablet Take 1 tablet by mouth 4 (four) times daily as needed for diarrhea or loose stools.  Marland Kitchen estradiol (ESTRACE) 0.1 MG/GM vaginal cream Place 1 Applicatorful vaginally at bedtime.  . flurazepam (DALMANE) 15 MG capsule Take 15 mg by mouth. 2-3 times a week.  . fluticasone (FLONASE) 50 MCG/ACT nasal spray Place 1 spray into the nose as needed. Sinus drainage  . GuaiFENesin (MUCINEX PO) Take 1 tablet by mouth every 12 (twelve) hours as needed (congestion).   Marland Kitchen ipratropium (ATROVENT) 0.03 % nasal spray As directed nasal spray  . Polyethyl Glycol-Propyl Glycol (SYSTANE OP) Apply to eye.  . ranitidine (ZANTAC) 150 MG tablet Take 1 tablet  (150 mg total) by mouth 2 (two) times daily. Replaces Omeprazole ( Prilosec)   Past Medical History:  Diagnosis Date  . Allergy   . Arthritis   . Back pain   . Candidal esophagitis ([place]) 2010  . Chest pain, atypical   . Collagenous colitis    Dr. Maurene Capes  . Diverticulosis   . GERD (gastroesophageal reflux disease)   . Heart murmur   . Hiatal hernia 2010  . History of fibromyalgia   . Hypercalcemia   . Hyperlipidemia   . Hypertension   . IBS (irritable bowel syndrome)   . Insomnia   . Osteoporosis   . Primary hyperparathyroidism ([place]) 03/11/2017  . Rectocele   . Scoliosis   . Shingles 1975   at waistline, no chronic pain.    Marland Kitchen Spinal stenosis   . Vaginal prolapse    Past Surgical History:  Procedure Laterality Date  . ABDOMINAL HYSTERECTOMY    . BREAST BIOPSY  1964   left benign  . CARDIOVASCULAR STRESS TEST  12/03/2007   EF 72%  . CATARACT EXTRACTION W/ INTRAOCULAR LENS  IMPLANT, BILATERAL    . COLONOSCOPY  04/24/2006   diverticulosis  . FLEXIBLE SIGMOIDOSCOPY  07/27/2010   diverticulosis, collagenous colitis  . TONSILLECTOMY AND ADENOIDECTOMY    . UPPER GASTROINTESTINAL ENDOSCOPY  01/29/2008   esophagitis, hiatal hernia, gastritis  . US ECHOCARDIOGRAPHY  10/30/2007    EF 55-60%   Social History   Social History Narrative   Social History      Diet? Regular (shouldn't have excessive amts of acid, hot &spicey foods)      Do you drink/eat things with caffeine? yes      Marital status?       widow                             What year were you married? [place] you live in a house, apartment, assisted living, condo, trailer, etc.? apartment      Is it one or more stories? 1      How many persons live in your home? 1 (self)      Do you have any pets in your home? (please list) no no !!      Highest level of education completed?      Current or past profession: hairdresser      Do you exercise?      little                                Type & how  often? Leg lifts and plank, stretching every day.      Advanced Directives      Do you have a living will?  yes      Do you have a DNR form?         yes                         If not, do you want to discuss one? yes      Do you have signed POA/HPOA for forms? yes      Functional Status      Do you have difficulty bathing or dressing yourself?      Do you have difficulty preparing food or eating?       Do you have difficulty managing your medications?      Do you have difficulty managing your finances?      Do you have difficulty affording your medications?   family history includes Breast cancer in her mother; Cancer - Other in her sister; Cirrhosis in her sister; Colitis in her sister; Heart disease in her brother, brother, father, and sister; Pneumonia in her sister; Stroke in her mother.   Review of Systems As above  Objective:   Physical Exam BP 110/70   Pulse 72   Ht 5\' 2"  (1.575 m)   Wt 126 lb (57.2 kg)   BMI 23.05 kg/m  NAD  15 minutes time spent with patient > half in counseling coordination of care

## 2017-07-05 ENCOUNTER — Telehealth: Payer: Self-pay | Admitting: Internal Medicine

## 2017-07-05 NOTE — Telephone Encounter (Signed)
Pt needs prescription for budisonide faxed to [facility]. Prescription should indicate customer ID: [id number] and order # [id number]. Pt stated that Dr. Carlean Purl told her about this pharmacy. She prefers 100 pills so she can get free shipping.

## 2017-07-08 ENCOUNTER — Ambulatory Visit (HOSPITAL_COMMUNITY): Payer: Medicare HMO | Attending: Cardiology

## 2017-07-08 ENCOUNTER — Telehealth: Payer: Self-pay | Admitting: Internal Medicine

## 2017-07-08 ENCOUNTER — Other Ambulatory Visit: Payer: Self-pay

## 2017-07-08 DIAGNOSIS — I083 Combined rheumatic disorders of mitral, aortic and tricuspid valves: Secondary | ICD-10-CM | POA: Diagnosis not present

## 2017-07-08 DIAGNOSIS — Z8249 Family history of ischemic heart disease and other diseases of the circulatory system: Secondary | ICD-10-CM | POA: Diagnosis not present

## 2017-07-08 DIAGNOSIS — E785 Hyperlipidemia, unspecified: Secondary | ICD-10-CM | POA: Insufficient documentation

## 2017-07-08 DIAGNOSIS — I359 Nonrheumatic aortic valve disorder, unspecified: Secondary | ICD-10-CM

## 2017-07-08 DIAGNOSIS — I10 Essential (primary) hypertension: Secondary | ICD-10-CM | POA: Insufficient documentation

## 2017-07-08 NOTE — Telephone Encounter (Signed)
See other phone message  

## 2017-07-08 NOTE — Telephone Encounter (Signed)
Spoke to patient and let her know that I will get Dr. Carlean Purl to sign the Rx we received it this morning, I will leave it on his desk. Patient states she may run out by the weekend but she will be sure to call back before then. I explained to her the importance of staying on this so that she does not have a flare up of her colitis. She verbalized understanding.   She also wanted to note that she is doing well on the budesonide and has only had one day that she has had to take 4 Lomotil and she has gone 1-2 days at most without any loose stools or diarrhea. She is concerned about an intense sharp, shooting pain she gets in her neck that radiates to her ear but denies any dizziness or chest pain. She is concerned this may be a side effect of medication but it has only happened twice.

## 2017-07-08 NOTE — Telephone Encounter (Signed)
If she needs some refills would give her a 2 week supply of Entocort while we wait for it to come from [place]  I doubt those pains are from the medication  If they persist she should check with PCP Dr. Jonni Sanger

## 2017-07-09 ENCOUNTER — Telehealth: Payer: Self-pay

## 2017-07-09 MED ORDER — BUDESONIDE 3 MG PO CPEP: 9.0000 mg | ORAL_CAPSULE | Freq: Every day | ORAL | 2 refills | Status: DC

## 2017-07-09 NOTE — Telephone Encounter (Signed)
[facility] Rx information if needed in the future: telephone: [phone] and fax # [phone].

## 2017-07-09 NOTE — Telephone Encounter (Signed)
Patient informed that Rx has been sent to pharmacy [facility] and a 2 week supply sent to her local pharmacy. Instructed to call Dr. Jonni Sanger if neck pain persists.

## 2017-07-16 ENCOUNTER — Telehealth: Payer: Self-pay | Admitting: Internal Medicine

## 2017-07-16 DIAGNOSIS — R197 Diarrhea, unspecified: Secondary | ICD-10-CM

## 2017-07-16 NOTE — Telephone Encounter (Signed)
She weighs 121 lbs on her scale yesterday.   She is notified of the recommendations about 6 lomotil a day.  She is taking 9 mg budesonide a day

## 2017-07-16 NOTE — Telephone Encounter (Signed)
Make sure she stays on 9 mg budesonide (3 capsules daily) Up to 6 Lomotil in a day  How much does she weigh now?  Was 126# last visit

## 2017-07-16 NOTE — Telephone Encounter (Signed)
Patient reports that she has had 2 bad days and lomotil is not helping.  She is asking how many lomotil that she can take a day. She has had 4 loose BMs today. She reports that she continues to lose weight. She is asking if you have any recommendations for a nutritional supplement.  She is asking if she should see a nutritionist?

## 2017-07-17 NOTE — Telephone Encounter (Signed)
She needs C diff and GI pathogen panel  Hydrate with ORS  Send her this by My Chart  ORAL REHYDRATION SOLUTION RECIPES   Sugar and salt water   . 1 quart water .  teaspoon salt . 6 teaspoons sugar . Optional: Crystal Light to taste (especially lemonade or orange-pineapple flavors)   Gatorade G2   . 4 cups Gatorade G2 (or one, 32 ounce bottle) . 1/2 teaspoon salt   Chicken Broth   . 4 cups water . 1 dry chicken broth cube .  teaspoon salt . 2 tablespoon sugar OR . 2 cups liquid broth . 2 cups water . 2 tablespoon sugar  Tomato Juice   . 2  cups tomato juice . 1  cups water  Homemade Cereal Based  .  cup dry, precooked baby rice cereal . 2 cups water .  teaspoon salt . Combine ingredients and mix until well dissolved and smooth. Refrigerate. Solution should be thick, but pourable and drinkable.

## 2017-07-17 NOTE — Telephone Encounter (Signed)
Patient called this am to report 3 watery BMs this am.  She has tried 4 lomotil today.

## 2017-07-17 NOTE — Telephone Encounter (Signed)
Patient notified

## 2017-07-17 NOTE — Telephone Encounter (Signed)
Pt is calling back about her watery stools.  They are more frequent with the Lomotil

## 2017-07-17 NOTE — Telephone Encounter (Signed)
Left message for patient to call back  

## 2017-07-18 ENCOUNTER — Other Ambulatory Visit: Payer: Medicare HMO

## 2017-07-18 DIAGNOSIS — R197 Diarrhea, unspecified: Secondary | ICD-10-CM

## 2017-07-19 ENCOUNTER — Telehealth: Payer: Self-pay | Admitting: Internal Medicine

## 2017-07-19 MED ORDER — DIPHENOXYLATE-ATROPINE 2.5-0.025 MG PO TABS: 1.0000 | ORAL_TABLET | Freq: Four times a day (QID) | ORAL | 0 refills | Status: DC | PRN

## 2017-07-19 NOTE — Telephone Encounter (Signed)
Lomotil rx faxed to [name] as requested. She weighted in at 122 with no shoes yesterday. Two days ago she had diarrhea 6 times and took 6 lomotil, yesterday she had 7 episodes of diarrhea and took 6 lomotil, today so far at 5:15pm she has had 3 episodes of diarrhea and has taken 3 lomotil. The diarrhea is "watery" and her stomach is "noisey" and bowel movements come on easily. She doesn't even feel the urge. She will go to urinate and stool comes out. She ask about her test results and I told her the GI pathogen panel is still pending but the C-Diff result was Negative.

## 2017-07-19 NOTE — Telephone Encounter (Signed)
May I refill Beth Wise? Thank you. 

## 2017-07-19 NOTE — Telephone Encounter (Signed)
OK to refill x 1 but also get a sx updtae on diarrhea and her weight

## 2017-07-19 NOTE — Telephone Encounter (Signed)
Pt needs rf for lomotil sent to [facility] on [address].

## 2017-07-22 ENCOUNTER — Ambulatory Visit (INDEPENDENT_AMBULATORY_CARE_PROVIDER_SITE_OTHER): Payer: Medicare HMO | Admitting: Emergency Medicine

## 2017-07-22 DIAGNOSIS — E538 Deficiency of other specified B group vitamins: Secondary | ICD-10-CM

## 2017-07-22 LAB — GASTROINTESTINAL PATHOGEN PANEL PCR
C. difficile Tox A/B, PCR: NOT DETECTED
Campylobacter, PCR: NOT DETECTED
Cryptosporidium, PCR: NOT DETECTED
E coli (ETEC) LT/ST PCR: NOT DETECTED
E coli (STEC) stx1/stx2, PCR: NOT DETECTED
E coli 0157, PCR: NOT DETECTED
Giardia lamblia, PCR: NOT DETECTED
Norovirus, PCR: NOT DETECTED
Rotavirus A, PCR: NOT DETECTED
Salmonella, PCR: NOT DETECTED
Shigella, PCR: NOT DETECTED

## 2017-07-22 LAB — CLOSTRIDIUM DIFFICILE TOXIN B, QUALITATIVE, REAL-TIME PCR: Toxigenic C. Difficile by PCR: NOT DETECTED

## 2017-07-22 MED ORDER — CYANOCOBALAMIN 1000 MCG/ML IJ SOLN
1000.0000 ug | Freq: Once | INTRAMUSCULAR | Status: AC
  Administered 2017-07-22: 1000 ug via INTRAMUSCULAR

## 2017-07-22 NOTE — Progress Notes (Signed)
Patient presents for B12 injection today. Patient received her B12 injection in Left  Deltoid. Patient tolerated injection well.  Documentation entered in MAR in EpicCare.   

## 2017-07-22 NOTE — Telephone Encounter (Signed)
Sent a Pharmacist, community message that they are still pending

## 2017-07-22 NOTE — Telephone Encounter (Signed)
Pt is calling back again for stool sample results best call back # [phone]. Pt states it is okay to leave a detailed voicemail or mychart message.

## 2017-07-23 NOTE — Progress Notes (Signed)
She does not have any infection Please get a symptom update re:  1) diarrhea 2) Weight loss 3) Lomotil use

## 2017-07-23 NOTE — Progress Notes (Signed)
Something is not right  If she is having sxs from microscopic colitis she should be better on the budesonide  She needs to have a colonoscopy  I could do 8/6 at 1000

## 2017-07-24 ENCOUNTER — Telehealth: Payer: Self-pay | Admitting: Internal Medicine

## 2017-07-24 NOTE — Telephone Encounter (Signed)
Spoke with patient's son Mr.Turlington. Discussed patient's recent visit, lab results and the current plan for colonoscopy on 08/06/17. He states he talked with his mother last night and they are wondering about the risk associated with a colonoscopy at her age. Also, want to know if there is any other test/labs, etc that could be done instead of a colonoscopy. He would like Dr. Carlean Purl to call him at this number to discuss [phone]. (he is aware MD is out of office today) Also, asking if the colonoscopy could be done on another day due to a conflict on 8/6.

## 2017-07-24 NOTE — Telephone Encounter (Signed)
Patient came in this morning and we went over her prepping instructions for her up coming colonoscopy. She verbalized understanding the instructions. She said she was in the shower when Dr Carlean Purl called her this AM and that she called and left him a message to call her back.

## 2017-07-25 NOTE — Telephone Encounter (Signed)
I spoke to her  She may be slightly better in that she thinks weight has levelled off and less nauseous  Still with watery diarrhea  She cannot easily make 8/6 colon appt and 130 8/7 now open so she will take that and need help modifying her prep instructions  We will call her and do this

## 2017-07-25 NOTE — Telephone Encounter (Signed)
Pt said she is returning Dr. Celesta Aver call from yesterday [phone]

## 2017-07-25 NOTE — Telephone Encounter (Signed)
I have booked the new date/time and will call her tomorrow to go over instructions and will mail them out to her tomorrow as well.

## 2017-07-26 NOTE — Telephone Encounter (Signed)
I spoke with Beth Wise and she will look for the instructions to come in the mail. She will call me with any questions.

## 2017-07-28 ENCOUNTER — Encounter: Payer: Self-pay | Admitting: Family Medicine

## 2017-07-29 ENCOUNTER — Telehealth: Payer: Self-pay | Admitting: Internal Medicine

## 2017-07-29 NOTE — Telephone Encounter (Signed)
FYI--   Patient states that she has noticed over the past week, feet and ankles have been swollen, swells more during the day and throughout the evening. She states that she has been sitting more and not as active due to the hot weather. However she tried some foot and toe exercise. And seems a little better this morning after the exercises. I advised patient to see Nurse @ [facility] and have her blood pressure checked. I advised to let us know if Blood Pressure is elevated. I advised patient to engage in inside exercise, advised to call back with questions or concerns. Patient verbalized understanding.    Doloris Hall,  LPN

## 2017-07-29 NOTE — Telephone Encounter (Signed)
Questions answered for her upcoming procedure.

## 2017-08-01 NOTE — Telephone Encounter (Signed)
Patient states she has been feeling nauseous and having trouble swallowing. Patient wanting to know if she can have endo done with colon on 8.7.19. Patient requesting to speak with PJ.

## 2017-08-01 NOTE — Telephone Encounter (Signed)
I spoke with Beth Wise and she is wondering if she should get the EGD with her colon next week. Several of her pills were hard to swallow for like 3 days last week. She also said her stomach "comes up thru her throat" at times when she has a bowel movement. No nausea. Please advise [name]. She's your 1:30pm procedure on 08/07/17. Thank you.

## 2017-08-02 ENCOUNTER — Telehealth: Payer: Self-pay

## 2017-08-02 ENCOUNTER — Other Ambulatory Visit: Payer: Self-pay | Admitting: Internal Medicine

## 2017-08-02 DIAGNOSIS — R197 Diarrhea, unspecified: Secondary | ICD-10-CM

## 2017-08-02 DIAGNOSIS — R1310 Dysphagia, unspecified: Secondary | ICD-10-CM

## 2017-08-02 NOTE — Telephone Encounter (Signed)
Received fax from [name] for Lomotil refill. When I spoke with her earlier she told me she only has #8 pills left. Will route to Dr Carlean Purl to advise.

## 2017-08-02 NOTE — Telephone Encounter (Signed)
Ask Beth Wise or Beth Wise if we can add an EGD to that slot dx: dysphagia  If ok we will do that

## 2017-08-02 NOTE — Telephone Encounter (Signed)
EGD add on approved with Lanelle Bal. Added to appointment, patient informed and I told her to come at 12:15pm instead of 1:30pm on 08/07/17. Left message for [facility] of the add on.

## 2017-08-04 ENCOUNTER — Encounter (INDEPENDENT_AMBULATORY_CARE_PROVIDER_SITE_OTHER): Payer: Self-pay

## 2017-08-04 ENCOUNTER — Other Ambulatory Visit: Payer: Self-pay | Admitting: Internal Medicine

## 2017-08-04 MED ORDER — DIPHENOXYLATE-ATROPINE 2.5-0.025 MG PO TABS: 1.0000 | ORAL_TABLET | Freq: Four times a day (QID) | ORAL | 0 refills | Status: DC | PRN

## 2017-08-06 ENCOUNTER — Telehealth: Payer: Self-pay | Admitting: Internal Medicine

## 2017-08-06 ENCOUNTER — Encounter: Payer: Medicare HMO | Admitting: Internal Medicine

## 2017-08-06 NOTE — Telephone Encounter (Signed)
Spoke with patient and she did states that she did fall yesterday and hurt her r hip.  States "just bruised" Did not have it X-rayed.  Denies pain.  Son aware of her fall. She stated that "he made" her call us.  Patient states that she "will be fine."

## 2017-08-06 NOTE — Telephone Encounter (Signed)
Dr Carlean Purl sent in the refill over the weekend for her.

## 2017-08-07 ENCOUNTER — Ambulatory Visit (AMBULATORY_SURGERY_CENTER): Payer: Medicare HMO | Admitting: Internal Medicine

## 2017-08-07 ENCOUNTER — Encounter: Payer: Self-pay | Admitting: Internal Medicine

## 2017-08-07 VITALS — BP 126/70 | HR 65 | Temp 97.8°F | Resp 17 | Ht 62.0 in | Wt 126.0 lb

## 2017-08-07 DIAGNOSIS — Z1211 Encounter for screening for malignant neoplasm of colon: Secondary | ICD-10-CM | POA: Diagnosis not present

## 2017-08-07 DIAGNOSIS — R1310 Dysphagia, unspecified: Secondary | ICD-10-CM | POA: Diagnosis not present

## 2017-08-07 DIAGNOSIS — K52831 Collagenous colitis: Secondary | ICD-10-CM

## 2017-08-07 DIAGNOSIS — K52832 Lymphocytic colitis: Secondary | ICD-10-CM | POA: Diagnosis not present

## 2017-08-07 DIAGNOSIS — R197 Diarrhea, unspecified: Secondary | ICD-10-CM

## 2017-08-07 DIAGNOSIS — K2970 Gastritis, unspecified, without bleeding: Secondary | ICD-10-CM

## 2017-08-07 DIAGNOSIS — K2950 Unspecified chronic gastritis without bleeding: Secondary | ICD-10-CM | POA: Diagnosis not present

## 2017-08-07 MED ORDER — SODIUM CHLORIDE 0.9 % IV SOLN: 500.0000 mL | Freq: Once | INTRAVENOUS | Status: DC

## 2017-08-07 NOTE — Op Note (Signed)
[place] Patient Name: Beth Wise Procedure Date: 08/07/2017 1:25 PM MRN: [id number] Endoscopist: Gatha Mayer , MD Age: 84 Referring MD:  Date of Birth: 21-Apr-1925 Gender: Female Account #: [id number] Procedure:                Upper GI endoscopy Indications:              Dysphagia Medicines:                Propofol per Anesthesia, Monitored Anesthesia Care Procedure:                Pre-Anesthesia Assessment:                           - Prior to the procedure, a History and Physical                            was performed, and patient medications and                            allergies were reviewed. The patient's tolerance of                            previous anesthesia was also reviewed. The risks                            and benefits of the procedure and the sedation                            options and risks were discussed with the patient.                            All questions were answered, and informed consent                            was obtained. Prior Anticoagulants: The patient has                            taken no previous anticoagulant or antiplatelet                            agents. ASA Grade Assessment: II - A patient with                            mild systemic disease. After reviewing the risks                            and benefits, the patient was deemed in                            satisfactory condition to undergo the procedure.                           After obtaining informed consent, the endoscope was  passed under direct vision. Throughout the                            procedure, the patient's blood pressure, pulse, and                            oxygen saturations were monitored continuously. The                            Endoscope was introduced through the mouth, and                            advanced to the second part of duodenum. The upper                            GI endoscopy was  accomplished without difficulty.                            The patient tolerated the procedure well. Scope In: Scope Out: Findings:                 One mild stenosis was found at the cricopharyngeus.                            The stenosis was traversed.                           Diffuse atrophic mucosa was found in the entire                            examined stomach. Biopsies were taken with a cold                            forceps for histology. Verification of patient                            identification for the specimen was done. Estimated                            blood loss was minimal.                           The exam was otherwise without abnormality.                           The cardia and gastric fundus were normal on                            retroflexion. Complications:            No immediate complications. Estimated Blood Loss:     Estimated blood loss was minimal. Impression:               - Esophageal stenosis. Mild stenosis at UES -  likely extrinsic from C spine - slight trauma with                            scope insertion.                           - Gastric mucosal atrophy. Biopsied.                           - The examination was otherwise normal. Recommendation:           - Patient has a contact number available for                            emergencies. The signs and symptoms of potential                            delayed complications were discussed with the                            patient. Return to normal activities tomorrow.                            Written discharge instructions were provided to the                            patient.                           - Resume previous diet.                           - Continue present medications.                           - Await pathology results.                           - See the other procedure note for documentation of                            additional  recommendations. Gatha Mayer, MD 08/07/2017 2:08:50 PM This report has been signed electronically.

## 2017-08-07 NOTE — Progress Notes (Signed)
A and O x3. Report to RN. Tolerated MAC anesthesia well.Teeth unchanged after procedure.

## 2017-08-07 NOTE — Progress Notes (Signed)
Called to room to assist during endoscopic procedure.  Patient ID and intended procedure confirmed with present staff. Received instructions for my participation in the procedure from the performing physician.  

## 2017-08-07 NOTE — Progress Notes (Signed)
Per Dr. Carlean Purl send pathology specimens [facility].  maw

## 2017-08-07 NOTE — Patient Instructions (Addendum)
The colon looked like it has - so suspect this is collagenous colitis that is unfortunately refractory to the steroid treatment.  I found some changes of gastritis in the stomach and took biopsies. Not worried about that just wanted to lear about it.  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  I should have information for you by Monday at the latest.  I appreciate the opportunity to care for you. Gatha Mayer, MD, [facility]  Handouts given on gastritis and diverticulosis.   YOU HAD AN ENDOSCOPIC PROCEDURE TODAY AT [facility]:   Refer to the procedure report that was given to you for any specific questions about what was found during the examination.  If the procedure report does not answer your questions, please call your gastroenterologist to clarify.  If you requested that your care partner not be given the details of your procedure findings, then the procedure report has been included in a sealed envelope for you to review at your convenience later.  YOU SHOULD EXPECT: Some feelings of bloating in the abdomen. Passage of more gas than usual.  Walking can help get rid of the air that was put into your GI tract during the procedure and reduce the bloating. If you had a lower endoscopy (such as a colonoscopy or flexible sigmoidoscopy) you may notice spotting of blood in your stool or on the toilet paper. If you underwent a bowel prep for your procedure, you may not have a normal bowel movement for a few days.  Please Note:  You might notice some irritation and congestion in your nose or some drainage.  This is from the oxygen used during your procedure.  There is no need for concern and it should clear up in a day or so.  SYMPTOMS TO REPORT IMMEDIATELY:   Following lower endoscopy (colonoscopy or flexible sigmoidoscopy):  Excessive amounts of blood in the  stool  Significant tenderness or worsening of abdominal pains  Swelling of the abdomen that is new, acute  Fever of 100F or higher   Following upper endoscopy (EGD)  Vomiting of blood or coffee ground material  New chest pain or pain under the shoulder blades  Painful or persistently difficult swallowing  New shortness of breath  Fever of 100F or higher  Black, tarry-looking stools  For urgent or emergent issues, a gastroenterologist can be reached at any hour by calling [phone].   DIET:  We do recommend a small meal at first, but then you may proceed to your regular diet.  Drink plenty of fluids but you should avoid alcoholic beverages for 24 hours.  ACTIVITY:  You should plan to take it easy for the rest of today and you should NOT DRIVE or use heavy machinery until tomorrow (because of the sedation medicines used during the test).    FOLLOW UP: Our staff will call the number listed on your records the next business day following your procedure to check on you and address any questions or concerns that you may have regarding the information given to you following your procedure. If we do not reach you, we will leave a message.  However, if you are feeling well and you are not experiencing any problems, there is no need to return our call.  We will assume that you have returned to your regular daily activities without incident.  If any biopsies were taken you will be contacted by phone or by letter within the next 1-3 weeks.  Please call us at [phone] if you have not heard about the biopsies in 3 weeks.    SIGNATURES/CONFIDENTIALITY: You and/or your care partner have signed paperwork which will be entered into your electronic medical record.  These signatures attest to the fact that that the information above on your After Visit Summary has been reviewed and is understood.  Full responsibility of the confidentiality of this discharge information lies with you and/or your  care-partner.

## 2017-08-07 NOTE — Op Note (Signed)
[place] Patient Name: Beth Wise Procedure Date: 08/07/2017 1:24 PM MRN: [id number] Endoscopist: Gatha Mayer , MD Age: 83 Referring MD:  Date of Birth: 08-03-1925 Gender: Female Account #: [id number] Procedure:                Colonoscopy Indications:              Chronic diarrhea Medicines:                Propofol per Anesthesia, Monitored Anesthesia Care Procedure:                Pre-Anesthesia Assessment:                           - Prior to the procedure, a History and Physical                            was performed, and patient medications and                            allergies were reviewed. The patient's tolerance of                            previous anesthesia was also reviewed. The risks                            and benefits of the procedure and the sedation                            options and risks were discussed with the patient.                            All questions were answered, and informed consent                            was obtained. Prior Anticoagulants: The patient has                            taken no previous anticoagulant or antiplatelet                            agents. ASA Grade Assessment: II - A patient with                            mild systemic disease. After reviewing the risks                            and benefits, the patient was deemed in                            satisfactory condition to undergo the procedure.                           After obtaining informed consent, the colonoscope  was passed under direct vision. Throughout the                            procedure, the patient's blood pressure, pulse, and                            oxygen saturations were monitored continuously. The                            Model PCF-H190DL [id number]) scope was introduced                            through the anus and advanced to the the cecum,                            identified by  appendiceal orifice and ileocecal                            valve. The colonoscopy was performed without                            difficulty. The patient tolerated the procedure                            well. The quality of the bowel preparation was                            good. The rectum was photographed. I forgot to                            photo the cecum Scope In: 1:41:10 PM Scope Out: 1:54:18 PM Scope Withdrawal Time: 0 hours 6 minutes 40 seconds  Total Procedure Duration: 0 hours 13 minutes 8 seconds  Findings:                 The perianal and digital rectal examinations were                            normal.                           A patchy area of mildly vascular-pattern-decreased                            mucosa was found in the entire colon. Biopsies for                            histology were taken with a cold forceps from the                            right colon, left colon and rectum for evaluation                            of microscopic colitis. Verification of patient  identification for the specimen was done. Estimated                            blood loss was minimal.                           Multiple diverticula were found in the sigmoid                            colon.                           The exam was otherwise without abnormality on                            direct and retroflexion views. Complications:            No immediate complications. Estimated Blood Loss:     Estimated blood loss was minimal. Impression:               - Vascular-pattern-decreased mucosa in the entire                            examined colon. Biopsied.                           - Diverticulosis in the sigmoid colon.                           - The examination was otherwise normal on direct                            and retroflexion views. Recommendation:           - Patient has a contact number available for                             emergencies. The signs and symptoms of potential                            delayed complications were discussed with the                            patient. Return to normal activities tomorrow.                            Written discharge instructions were provided to the                            patient.                           - Continue present medications.                           - No repeat colonoscopy.                           -  So the findings here suggest she has persistent                            diarrhea from collagenous colitis that is                            refractory to budesonuide and prednisone.                           I got a glimpse of TI at IC valve that looked ok                            but did not/could not enter.                           Await pathology results and will decide next steps Gatha Mayer, MD 08/07/2017 2:11:47 PM This report has been signed electronically.

## 2017-08-08 ENCOUNTER — Telehealth: Payer: Self-pay

## 2017-08-08 NOTE — Telephone Encounter (Signed)
  Follow up Call-  Call back number 08/07/2017 07/13/2015  Post procedure Call Back phone  # [phone]  Permission to leave phone message Yes Yes  Some recent data might be hidden     Patient questions:  Do you have a fever, pain , or abdominal swelling? No. Pain Score  0 *  Have you tolerated food without any problems? Yes.    Have you been able to return to your normal activities? Yes.    Do you have any questions about your discharge instructions: Diet   No. Medications  No. Follow up visit  No.  Do you have questions or concerns about your Care? No.  Actions: * If pain score is 4 or above: No action needed, pain <4.

## 2017-08-09 NOTE — Progress Notes (Signed)
Please call from office  Stomach biopsies ok Colon biopsies show persistent collagenous colitis  I will call her to discuss changing treatment when I am back next week

## 2017-08-13 ENCOUNTER — Telehealth: Payer: Self-pay | Admitting: Internal Medicine

## 2017-08-13 MED ORDER — DIPHENOXYLATE-ATROPINE 2.5-0.025 MG PO TABS: 1.0000 | ORAL_TABLET | Freq: Four times a day (QID) | ORAL | 3 refills | Status: DC | PRN

## 2017-08-13 NOTE — Telephone Encounter (Signed)
Patient informed that Lomotil rx faxed to [facility] and she said thank you. She said she has one more refill on her budesonide thru [facility].

## 2017-08-13 NOTE — Telephone Encounter (Signed)
Beth Wise is in need of a lomotil refill okay [name]? She is adjusting her dosage and doesn't want to run out. Her big 89th BD party is Saturday. She is calling [facility] now for her budesonide refill. She said she has her account already. She will call me back if their are any problems.

## 2017-08-13 NOTE — Telephone Encounter (Signed)
PJ please see note below regarding Budesonide from [facility]. I am not familiar with that.

## 2017-08-13 NOTE — Progress Notes (Signed)
Biopsies show collagenous colitis and mild gastritis My Chart  No letter/recall

## 2017-08-13 NOTE — Telephone Encounter (Signed)
Glad to hear she is doing better She needs to stay on the budesonide and so needs to get it from [facility]  I cannot send the rx until she registers with the website  [email or web address]

## 2017-08-13 NOTE — Telephone Encounter (Signed)
Pt called back with an update and states that her bowels are doing better. She started taking the lomotil before meals and at bedtime. She also increased to 2 tabs at times over the weekend. She has since decreased back to 1 tab QID as her last BM was Sunday at 3:15pm. She wants to know if Dr. Carlean Purl intends for her to continue taking budesonide as she is because if so she will need to get a refill from [facility].

## 2017-08-13 NOTE — Telephone Encounter (Signed)
Refill with # 120 and 3 refills

## 2017-08-14 ENCOUNTER — Telehealth: Payer: Self-pay | Admitting: Internal Medicine

## 2017-08-14 NOTE — Telephone Encounter (Signed)
Spoke with Beth Wise and told her I called the pharmacy and they have her Lomotil on file since she last got it filled on 08/11/17. She can get her refill this Friday or Saturday per the pharmacy. She is concerned about her memory and will talk to Dr Carlean Purl at her visit. She forgot a meeting she had yesterday. She is very excited about her upcoming 89th Birthday party on Saturday.

## 2017-08-14 NOTE — Telephone Encounter (Signed)
Spoke to her  She is probably same  Please schedule her to see me late AM 8/30 - open a slot please

## 2017-08-14 NOTE — Telephone Encounter (Signed)
I spoke to her and added her on for 08/30/17 at 2:30pm per Dr Carlean Purl. She cannot come in the AM.

## 2017-08-22 ENCOUNTER — Ambulatory Visit: Payer: Medicare HMO

## 2017-08-23 ENCOUNTER — Ambulatory Visit (INDEPENDENT_AMBULATORY_CARE_PROVIDER_SITE_OTHER): Payer: Medicare HMO | Admitting: Emergency Medicine

## 2017-08-23 ENCOUNTER — Telehealth: Payer: Self-pay | Admitting: Internal Medicine

## 2017-08-23 DIAGNOSIS — E538 Deficiency of other specified B group vitamins: Secondary | ICD-10-CM

## 2017-08-23 MED ORDER — CYANOCOBALAMIN 1000 MCG/ML IJ SOLN
1000.0000 ug | Freq: Once | INTRAMUSCULAR | Status: AC
  Administered 2017-08-23: 1000 ug via INTRAMUSCULAR

## 2017-08-23 NOTE — Telephone Encounter (Signed)
See message from the patient.  Is there another plan for her?

## 2017-08-23 NOTE — Telephone Encounter (Signed)
Patient states she has been taking lomotil but if she takes it as prescribed she will stop having BMs and have abd pain, but if she doesn't take enough she has lose stools. Patient requesting advice from Dr.Gessner or the nurse.

## 2017-08-23 NOTE — Telephone Encounter (Signed)
Clarify how many a day she is taking  She probably needs to reduce #/day

## 2017-08-23 NOTE — Telephone Encounter (Signed)
Left message for patient to call back  

## 2017-08-23 NOTE — Progress Notes (Signed)
Beth Wise is a 84 y.o. female presents to the office today for Cyanocobalamin 1000 injection, per physician's orders. Cyanocobalamin(med), 1000 mcg(dose),  IM (route) was administered Right deltoid (location) today. Patient tolerated injection.  Leonidas Romberg, CMA

## 2017-08-23 NOTE — Telephone Encounter (Signed)
She reports that she was taking 2 QID.  She has been titratitng it.  She was afraid that she needed to take two a day.  I explained that she should continue to titrate lomotil. She is concerned about lomotil causing alzheimer's .  She will discuss with you when she comes for OV next week.

## 2017-08-30 ENCOUNTER — Encounter: Payer: Self-pay | Admitting: Internal Medicine

## 2017-08-30 ENCOUNTER — Ambulatory Visit: Payer: Medicare HMO | Admitting: Internal Medicine

## 2017-08-30 VITALS — BP 110/60 | HR 64 | Ht 62.0 in | Wt 122.0 lb

## 2017-08-30 DIAGNOSIS — K52831 Collagenous colitis: Secondary | ICD-10-CM

## 2017-08-30 NOTE — Progress Notes (Signed)
Beth Wise 84 y.o. [birth year] [id number]  Assessment & Plan:   Encounter Diagnosis  Name Primary?  . Collagenous colitis Yes    I do not think she is under control but I want her to stop the Lomotil to see if she is or better.  I do not want her to take Lomotil chronically but it was the only thing that seemed to slow her diarrhea down before.  She does have some days where she does not have diarrhea but she takes Lomotil at least 1 or 2 every day.  When we remove that if she is having persistent problems the options are to treat with immunomodulators or Biologics and I did have a discussion about the risks benefits and indications of those in the testing that is needed to be done.  Mostly about Biologics I did give her literature about both of these.  She asked about taking a higher dose of budesonide and that might be an option though I have never tried that it might be worth it in a 84 year old person.  Another consideration would be a bile salt binder.  She is to contact me next week with the results of her trial off Lomotil if she can do that.  I appreciate the opportunity to care for this patient. CC: Leamon Arnt, MD   15 minutes time spent with patient > half in counseling coordination of care   Subjective:   Chief Complaint:  HPI Mrs. Beth Wise is here, she is now 83 as of August 19, she is still having some diarrheal symptoms but is using Lomotil perhaps up to as many as 6 a day though usually about 3 or 4.  She is on her budesonide at 9 mg daily.  Mother states she is okay but she is using some Lomotil as mentioned so we really do not know how she is doing.  I suspect from what she is saying she needs the Lomotil to help control her diarrhea.  She is worried about getting dementia and she feels a little bit fatigued and dull on the Lomotil but is pleased with how it helps her bowel movements. She wants to know if she can take lysine for cold sores and I told her yes.  Her  weight is down.  Wt Readings from Last 3 Encounters:  08/30/17 122 lb (55.3 kg)  08/07/17 126 lb (57.2 kg)  07/01/17 126 lb (57.2 kg)     Allergies  Allergen Reactions  . Actonel [Risedronate Sodium] Other (See Comments)    Joint pain  . Amoxicillin Diarrhea  . Erythromycin Other (See Comments)    Gi upset  . Lipitor [Atorvastatin Calcium]   . Zetia [Ezetimibe]     Joint pain   Current Meds  Medication Sig  . acetaminophen (TYLENOL) 500 MG tablet Take 500 mg by mouth every 6 (six) hours as needed.  Marland Kitchen BIOTIN PO Take 5,000 mcg by mouth daily.   . budesonide (ENTOCORT EC) 3 MG 24 hr capsule Take 3 capsules (9 mg total) by mouth daily.  Marland Kitchen denosumab (PROLIA) 60 MG/ML SOSY injection Inject into the skin.  Marland Kitchen diphenoxylate-atropine (LOMOTIL) 2.5-0.025 MG tablet Take 1 tablet by mouth 4 (four) times daily as needed for diarrhea or loose stools.  Marland Kitchen estradiol (ESTRACE) 0.1 MG/GM vaginal cream Place 1 Applicatorful vaginally at bedtime.  . flurazepam (DALMANE) 15 MG capsule Take 15 mg by mouth. 2-3 times a week.  . fluticasone (FLONASE) 50 MCG/ACT nasal spray Place  1 spray into the nose as needed. Sinus drainage  . GuaiFENesin (MUCINEX PO) Take 1 tablet by mouth every 12 (twelve) hours as needed (congestion).   Marland Kitchen ipratropium (ATROVENT) 0.03 % nasal spray As directed nasal spray  . Polyethyl Glycol-Propyl Glycol (SYSTANE OP) Apply to eye.  . ranitidine (ZANTAC) 150 MG tablet Take 1 tablet (150 mg total) by mouth 2 (two) times daily. Replaces Omeprazole ( Prilosec)   Past Medical History:  Diagnosis Date  . Allergy   . Arthritis   . Back pain   . Cancer ([place])    basal cell on face  . Candidal esophagitis ([place]) 2010  . Cataract   . Chest pain, atypical   . Collagenous colitis    Dr. Maurene Capes  . Diverticulosis   . GERD (gastroesophageal reflux disease)   . Heart murmur   . Hiatal hernia 2010  . History of fibromyalgia   . Hypercalcemia   . Hyperlipidemia   . Hypertension   . IBS  (irritable bowel syndrome)   . Insomnia   . Osteoporosis   . Primary hyperparathyroidism ([place]) 03/11/2017  . Rectocele   . Scoliosis   . Shingles 1975   at waistline, no chronic pain.    Marland Kitchen Spinal stenosis   . Vaginal prolapse    Past Surgical History:  Procedure Laterality Date  . ABDOMINAL HYSTERECTOMY    . BREAST BIOPSY  1964   left benign  . CARDIOVASCULAR STRESS TEST  12/03/2007   EF 72%  . CATARACT EXTRACTION W/ INTRAOCULAR LENS  IMPLANT, BILATERAL    . COLONOSCOPY  04/24/2006   diverticulosis  . FLEXIBLE SIGMOIDOSCOPY  07/27/2010   diverticulosis, collagenous colitis  . TONSILLECTOMY AND ADENOIDECTOMY    . UPPER GASTROINTESTINAL ENDOSCOPY  01/29/2008   esophagitis, hiatal hernia, gastritis  . US ECHOCARDIOGRAPHY  10/30/2007    EF 55-60%   Social History   Social History Narrative   Social History      Diet? Regular (shouldn't have excessive amts of acid, hot &spicey foods)      Do you drink/eat things with caffeine? yes      Marital status?       widow                             What year were you married? [place] you live in a house, apartment, assisted living, condo, trailer, etc.? apartment      Is it one or more stories? 1      How many persons live in your home? 1 (self)      Do you have any pets in your home? (please list) no no !!      Highest level of education completed?      Current or past profession: hairdresser      Do you exercise?      little                                Type & how often? Leg lifts and plank, stretching every day.      Advanced Directives      Do you have a living will?  yes      Do you have a DNR form?         yes  If not, do you want to discuss one? yes      Do you have signed POA/HPOA for forms? yes      Functional Status      Do you have difficulty bathing or dressing yourself?      Do you have difficulty preparing food or eating?       Do you have difficulty managing your  medications?      Do you have difficulty managing your finances?      Do you have difficulty affording your medications?   family history includes Breast cancer in her mother; Cancer - Other in her sister; Cirrhosis in her sister; Colitis in her sister; Heart disease in her brother, brother, father, and sister; Pneumonia in her sister; Stomach cancer in her sister; Stroke in her mother.   Review of Systems  As above Objective:   Physical Exam BP 110/60   Pulse 64   Ht 5\' 2"  (1.575 m)   Wt 122 lb (55.3 kg)   BMI 22.31 kg/m  No acute distress Looks younger than stated age

## 2017-08-30 NOTE — Patient Instructions (Signed)
  Please try and come off your Lomotil and let us know how that works out for you.   We are giving you information on Biologics and Immunomodulators to read over. Dr Carlean Purl circled the possibilities.    I appreciate the opportunity to care for you. Silvano Rusk, MD, [facility]

## 2017-09-03 ENCOUNTER — Ambulatory Visit: Payer: Medicare HMO | Admitting: Family Medicine

## 2017-09-05 ENCOUNTER — Telehealth: Payer: Self-pay | Admitting: Internal Medicine

## 2017-09-05 NOTE — Telephone Encounter (Signed)
I informed her and she wants to know if you think she will be able to come off the budesonide if she keeps doing well. And she said does she have to continue taking the lomotil one every few days since she's doing so well and has not taken any in 4 days now. Please advise and I'll pass it on.

## 2017-09-05 NOTE — Telephone Encounter (Signed)
Patient returned phone call. °

## 2017-09-05 NOTE — Telephone Encounter (Signed)
Beth Wise called in to report that she took 2 Lomotil on Saturday and 1 on Sunday. She said she is doing well with one bowel movement a day, it's soft but not watery. She wanted to update Dr Carlean Purl and see what future plans are.

## 2017-09-05 NOTE — Telephone Encounter (Signed)
She needs to stay on the budesonide until further notice  Let's get her a f/u next available and we can discuss more then

## 2017-09-05 NOTE — Telephone Encounter (Signed)
Keep trying to reduce and stop Lomotil 1 at a time every few days

## 2017-09-05 NOTE — Telephone Encounter (Signed)
Left her a message on her voice mail to call me back.

## 2017-09-06 ENCOUNTER — Telehealth: Payer: Self-pay | Admitting: Internal Medicine

## 2017-09-06 NOTE — Telephone Encounter (Signed)
Beth Wise doesn't understand why she has to stay on the Budesonide when she's doing so well. She said she has a supply and if you let her stop it and she had problems she could go right back on it. She has not even had a bowel movement today. She said she thinks the budesonide is making her" tired in her legs". She is reluctant to book another appointment at this time. Please advise [name], thank you.

## 2017-09-06 NOTE — Telephone Encounter (Signed)
Have her reduce to 1 capsule a day and see how that does if she will I would like her to have a longer period of time without diarrhea before we stop it completely

## 2017-09-06 NOTE — Telephone Encounter (Signed)
See phone note from yesterday for additional details.  

## 2017-09-06 NOTE — Telephone Encounter (Signed)
I spoke to Beth Wise and she is happy to try doing just one budesonide pill a day. She will let us know how she's doing.

## 2017-09-09 DIAGNOSIS — Z803 Family history of malignant neoplasm of breast: Secondary | ICD-10-CM | POA: Diagnosis not present

## 2017-09-09 DIAGNOSIS — Z1231 Encounter for screening mammogram for malignant neoplasm of breast: Secondary | ICD-10-CM | POA: Diagnosis not present

## 2017-09-09 DIAGNOSIS — M810 Age-related osteoporosis without current pathological fracture: Secondary | ICD-10-CM | POA: Diagnosis not present

## 2017-09-09 DIAGNOSIS — M8588 Other specified disorders of bone density and structure, other site: Secondary | ICD-10-CM | POA: Diagnosis not present

## 2017-09-09 LAB — HM DEXA SCAN

## 2017-09-09 LAB — HM MAMMOGRAPHY

## 2017-09-10 ENCOUNTER — Encounter: Payer: Self-pay | Admitting: Emergency Medicine

## 2017-09-12 ENCOUNTER — Encounter: Payer: Self-pay | Admitting: Emergency Medicine

## 2017-09-12 NOTE — Progress Notes (Signed)
Please call patient: I have reviewed his/her lab results. Her bone density is stable. Continue prolia.

## 2017-09-13 ENCOUNTER — Telehealth: Payer: Self-pay | Admitting: Internal Medicine

## 2017-09-13 NOTE — Telephone Encounter (Signed)
Pt wants to know how long she needs to take new dose of budesonide, she stated that dose was decreased.

## 2017-09-13 NOTE — Telephone Encounter (Signed)
Patient states she was decreased to one pill a day of budesonide and is doing much better. She is only having one BM a day. Patient would like to know how long to continue the budesonide. Please advise Dr. Carlean Purl.

## 2017-09-16 ENCOUNTER — Ambulatory Visit: Payer: Medicare HMO | Admitting: Family Medicine

## 2017-09-16 ENCOUNTER — Telehealth: Payer: Self-pay | Admitting: Internal Medicine

## 2017-09-16 NOTE — Telephone Encounter (Signed)
She is concerned about new FDA concerns about zantac that came out over the weekend.  She would like to know your opinion on this .  Low level of [place] levels.

## 2017-09-16 NOTE — Telephone Encounter (Signed)
Patient states she heard medication zantac has carcinogens in it and wants to know what Dr.Gessner thinks about this and if there is something else he would recommend.

## 2017-09-16 NOTE — Telephone Encounter (Signed)
Informed patient to continue budesonide once daily x 2 weeks and stop. Also to call with diarrhea symptoms after stopping the medication. Patient agreed and verbalized understanding.

## 2017-09-16 NOTE — Telephone Encounter (Signed)
Have her take 1 a day for 2 weeks and then stop  If she gets diarrhea again let me know soon

## 2017-09-17 ENCOUNTER — Ambulatory Visit: Payer: Medicare HMO | Admitting: Family Medicine

## 2017-09-17 ENCOUNTER — Telehealth: Payer: Self-pay | Admitting: Emergency Medicine

## 2017-09-17 NOTE — Telephone Encounter (Signed)
Copied from [place]. Topic: General - Other >> Sep 16, 2017  4:42 PM Bea Graff, NT wrote: Reason for CRM: Pt needing x-rays done of her back in order to go to a chiropractor and pt would like advice on where she could go that insurance would be willing to cover?  Spoke with Patient and she states that has an appointment and they advised her they could do xrays there.   Doloris Hall,  LPN

## 2017-09-18 NOTE — Telephone Encounter (Signed)
Patient notified of the response.  She feels that zantac is fine and will continue as she is.

## 2017-09-18 NOTE — Telephone Encounter (Signed)
There are very small amounts of [place] in some not all Zantac preparations. [place] has been linked to cancer but does not mean we know it will cause cancer in humans especially in the low levels.  She can change to famotidine 40 mg bid if she wants to.

## 2017-09-19 DIAGNOSIS — Z85828 Personal history of other malignant neoplasm of skin: Secondary | ICD-10-CM | POA: Diagnosis not present

## 2017-09-19 DIAGNOSIS — L661 Lichen planopilaris: Secondary | ICD-10-CM | POA: Diagnosis not present

## 2017-09-19 DIAGNOSIS — D1801 Hemangioma of skin and subcutaneous tissue: Secondary | ICD-10-CM | POA: Diagnosis not present

## 2017-09-19 DIAGNOSIS — D225 Melanocytic nevi of trunk: Secondary | ICD-10-CM | POA: Diagnosis not present

## 2017-09-19 DIAGNOSIS — K130 Diseases of lips: Secondary | ICD-10-CM | POA: Diagnosis not present

## 2017-09-20 DIAGNOSIS — M9902 Segmental and somatic dysfunction of thoracic region: Secondary | ICD-10-CM | POA: Diagnosis not present

## 2017-09-20 DIAGNOSIS — M62830 Muscle spasm of back: Secondary | ICD-10-CM | POA: Diagnosis not present

## 2017-09-20 DIAGNOSIS — M546 Pain in thoracic spine: Secondary | ICD-10-CM | POA: Diagnosis not present

## 2017-09-20 DIAGNOSIS — M9901 Segmental and somatic dysfunction of cervical region: Secondary | ICD-10-CM | POA: Diagnosis not present

## 2017-09-23 ENCOUNTER — Other Ambulatory Visit: Payer: Self-pay | Admitting: Internal Medicine

## 2017-09-23 ENCOUNTER — Ambulatory Visit (INDEPENDENT_AMBULATORY_CARE_PROVIDER_SITE_OTHER): Payer: Medicare HMO | Admitting: Family Medicine

## 2017-09-23 ENCOUNTER — Encounter: Payer: Self-pay | Admitting: Family Medicine

## 2017-09-23 ENCOUNTER — Other Ambulatory Visit: Payer: Self-pay

## 2017-09-23 VITALS — BP 120/70 | HR 75 | Temp 97.6°F | Ht 62.0 in | Wt 123.0 lb

## 2017-09-23 DIAGNOSIS — M4135 Thoracogenic scoliosis, thoracolumbar region: Secondary | ICD-10-CM | POA: Diagnosis not present

## 2017-09-23 DIAGNOSIS — D638 Anemia in other chronic diseases classified elsewhere: Secondary | ICD-10-CM | POA: Diagnosis not present

## 2017-09-23 DIAGNOSIS — M810 Age-related osteoporosis without current pathological fracture: Secondary | ICD-10-CM

## 2017-09-23 DIAGNOSIS — Z23 Encounter for immunization: Secondary | ICD-10-CM | POA: Diagnosis not present

## 2017-09-23 DIAGNOSIS — M542 Cervicalgia: Secondary | ICD-10-CM

## 2017-09-23 DIAGNOSIS — M62830 Muscle spasm of back: Secondary | ICD-10-CM | POA: Diagnosis not present

## 2017-09-23 DIAGNOSIS — E210 Primary hyperparathyroidism: Secondary | ICD-10-CM | POA: Diagnosis not present

## 2017-09-23 DIAGNOSIS — E538 Deficiency of other specified B group vitamins: Secondary | ICD-10-CM | POA: Diagnosis not present

## 2017-09-23 DIAGNOSIS — I10 Essential (primary) hypertension: Secondary | ICD-10-CM | POA: Diagnosis not present

## 2017-09-23 DIAGNOSIS — K52831 Collagenous colitis: Secondary | ICD-10-CM | POA: Diagnosis not present

## 2017-09-23 DIAGNOSIS — M9902 Segmental and somatic dysfunction of thoracic region: Secondary | ICD-10-CM | POA: Diagnosis not present

## 2017-09-23 DIAGNOSIS — Z7952 Long term (current) use of systemic steroids: Secondary | ICD-10-CM | POA: Diagnosis not present

## 2017-09-23 DIAGNOSIS — M546 Pain in thoracic spine: Secondary | ICD-10-CM | POA: Diagnosis not present

## 2017-09-23 DIAGNOSIS — M9901 Segmental and somatic dysfunction of cervical region: Secondary | ICD-10-CM | POA: Diagnosis not present

## 2017-09-23 LAB — COMPREHENSIVE METABOLIC PANEL
ALT: 9 U/L (ref 0–35)
AST: 15 U/L (ref 0–37)
Albumin: 4.1 g/dL (ref 3.5–5.2)
Alkaline Phosphatase: 31 U/L — ABNORMAL LOW (ref 39–117)
BUN: 25 mg/dL — ABNORMAL HIGH (ref 6–23)
CO2: 28 mEq/L (ref 19–32)
Calcium: 10.2 mg/dL (ref 8.4–10.5)
Chloride: 97 mEq/L (ref 96–112)
Creatinine, Ser: 1.01 mg/dL (ref 0.40–1.20)
GFR: 54.72 mL/min — ABNORMAL LOW (ref >60.00)
Glucose, Bld: 101 mg/dL — ABNORMAL HIGH (ref 70–99)
Potassium: 5.3 mEq/L — ABNORMAL HIGH (ref 3.5–5.1)
Sodium: 130 mEq/L — ABNORMAL LOW (ref 135–145)
Total Bilirubin: 0.2 mg/dL (ref 0.2–1.2)
Total Protein: 6.8 g/dL (ref 6.0–8.3)

## 2017-09-23 LAB — CBC WITH DIFFERENTIAL/PLATELET
Basophils Absolute: 0.1 10*3/uL (ref 0.0–0.1)
Basophils Relative: 0.7 % (ref 0.0–3.0)
Eosinophils Absolute: 0 10*3/uL (ref 0.0–0.7)
Eosinophils Relative: 0.6 % (ref 0.0–5.0)
HCT: 31.8 % — ABNORMAL LOW (ref 36.0–46.0)
Hemoglobin: 10.6 g/dL — ABNORMAL LOW (ref 12.0–15.0)
Lymphocytes Relative: 18.3 % (ref 12.0–46.0)
Lymphs Abs: 1.3 10*3/uL (ref 0.7–4.0)
MCHC: 33.2 g/dL (ref 30.0–36.0)
MCV: 94.1 fl (ref 78.0–100.0)
Monocytes Absolute: 0.7 10*3/uL (ref 0.1–1.0)
Monocytes Relative: 9.2 % (ref 3.0–12.0)
Neutro Abs: 5 10*3/uL (ref 1.4–7.7)
Neutrophils Relative %: 71.2 % (ref 43.0–77.0)
Platelets: 245 10*3/uL (ref 150.0–400.0)
RBC: 3.38 Mil/uL — ABNORMAL LOW (ref 3.87–5.11)
RDW: 14.6 % (ref 11.5–15.5)
WBC: 7.1 10*3/uL (ref 4.0–10.5)

## 2017-09-23 LAB — POCT GLYCOSYLATED HEMOGLOBIN (HGB A1C): HbA1c POC (<> result, manual entry): 5.4 % (ref 4.0–5.6)

## 2017-09-23 LAB — VITAMIN B12: Vitamin B-12: >1500 pg/mL — ABNORMAL HIGH (ref 211–911)

## 2017-09-23 MED ORDER — DENOSUMAB 60 MG/ML ~~LOC~~ SOSY
60.0000 mg | PREFILLED_SYRINGE | Freq: Once | SUBCUTANEOUS | Status: AC
  Administered 2017-09-23: 60 mg via SUBCUTANEOUS

## 2017-09-23 MED ORDER — CYANOCOBALAMIN 1000 MCG/ML IJ SOLN
1000.0000 ug | Freq: Once | INTRAMUSCULAR | Status: AC
  Administered 2017-09-23: 1000 ug via INTRAMUSCULAR

## 2017-09-23 NOTE — Progress Notes (Signed)
Subjective  CC:  Chief Complaint  Patient presents with  . Spasms    in legs and feet x few weeks, declines flu shots   . Prolia Injection    last one in March   . Ear Pain    wants to discuss Medications about opening up her ears  . Neck Pain    unusual pain that starts near ear and moves into pain, not constant     HPI: Beth Wise is a 83 y.o. female who presents to the office today to address the problems listed above in the chief complaint.  Hypertension f/u: Control is good . Pt reports she is doing well. taking medications as instructed, no medication side effects noted, no TIAs, no chest pain on exertion, no dyspnea on exertion, no swelling of ankles. She denies adverse effects from his BP medications. Compliance with medication is good.   Patient complains of intermittent ankle stiffness and occasional lower extremity muscle spasms.  She worries that her calcium is getting low because she is no longer taking it.  History of primary hyperparathyroidism.  Last calcium checked in June was normal.  In March it was minimally elevated.  Complains of left-sided intermittent sharp shooting pain in the posterior scalp that wraps anteriorly.  Pain is fleeting.  Not associated with neurologic deficits.  No neck pain.  Chronic back pain is mild and stable  Collagenous colitis is finally improved.  Now having normal bowel movements for about 2 weeks.  Is finally weaning off of the budesonide.  She does admit to some symptoms of hyperglycemia including polyuria and polydipsia.  No history of diabetes  He has allergy symptoms and fullness in ears at time.  She thinks this worsens her hearing.  She is on allergy medicines  Reviewed osteoporosis bone density scan results, continue Prolia.  Improved.  Anemia and chronic disease and vitamin B12 deficiency with persistent shortness of breath and fatigue.  Not feeling a ton better but no worse.  Recent EGD and colonoscopy revealed colitis but  no other abnormalities.  Health maintenance: Due for influenza vaccination  Assessment  1. Benign essential hypertension   2. Senile osteoporosis   3. Collagenous colitis   4. Anemia of chronic disease   5. B12 deficiency   6. Thoracogenic scoliosis of thoracolumbar region   7. Primary hyperparathyroidism ([place])   8. Cervicalgia   9. Current use of steroid medication   10. Osteoporosis, unspecified osteoporosis type, unspecified pathological fracture presence   11. Need for influenza vaccination      Plan    Hypertension f/u: BP control is well controlled.  Continue current medications  Primary hyperparathyroidism: Recheck calcium today.  Reassured  Osteoporosis: Prolia injection given today.  Recheck vitamin D levels in 6 months.  Recheck calcium today.  Continue supplements.  Cervicalgia: Educated.  Fleeting symptoms.  Reassured.  No treatment for now.  Advil if needed.  Muscle spasms: Recommend hydration and stretching.  Recheck electrolytes  Osteoarthritis and lumbar scoliosis/stenosis: Stable takes Tylenol daily to manage pain.  Monitor liver function.  Colitis: Improved.  Chronic steroid use over the last several months: Recheck sugar levels and A1c  Fatigue and vitamin deficiency anemia: Recheck levels today.  She may be persistently tired due to her continued inflammatory colitis that is now improving.  Recheck 3 months  High-dose influenza vaccination given today Education regarding management of these chronic disease states was given. Management strategies discussed on successive visits include dietary and exercise recommendations,  goals of achieving and maintaining IBW, and lifestyle modifications aiming for adequate sleep and minimizing stressors.   Follow up: Return in about 3 months (around 12/23/2017) for complete physical, AWV.  Orders Placed This Encounter  Procedures  . Flu vaccine HIGH DOSE PF  . CBC with Differential/Platelet  . PTH, Intact and Calcium   . Vitamin B12  . Comprehensive metabolic panel  . POCT glycosylated hemoglobin (Hb A1C)   Meds ordered this encounter  Medications  . denosumab (PROLIA) injection 60 mg  . cyanocobalamin ((VITAMIN B-12)) injection 1,000 mcg      BP Readings from Last 3 Encounters:  09/23/17 120/70  08/30/17 110/60  08/07/17 126/70   Wt Readings from Last 3 Encounters:  09/23/17 123 lb (55.8 kg)  08/30/17 122 lb (55.3 kg)  08/07/17 126 lb (57.2 kg)    Lab Results  Component Value Date   CHOL 205 (A) 11/13/2016   CHOL 185 08/16/2010   Lab Results  Component Value Date   HDL 74 (A) 11/13/2016   HDL 91.90 08/16/2010   Lab Results  Component Value Date   LDLCALC 111 11/13/2016   LDLCALC 78 08/16/2010   Lab Results  Component Value Date   TRIG 101 11/13/2016   TRIG 76.0 08/16/2010   Lab Results  Component Value Date   CHOLHDL 2 08/16/2010   No results found for: LDLDIRECT Lab Results  Component Value Date   CREATININE 0.98 06/17/2017   BUN 15 06/17/2017   NA 136 06/17/2017   K 3.7 06/17/2017   CL 104 06/17/2017   CO2 25 06/17/2017   Lab Results  Component Value Date   WBC 4.7 06/17/2017   HGB 9.9 (L) 06/17/2017   HCT 29.1 (L) 06/17/2017   MCV 93.5 06/17/2017   PLT 213.0 06/17/2017   Lab Results  Component Value Date   IRON 31 (L) 06/17/2017   TIBC 244 (L) 06/17/2017   FERRITIN 649 (H) 06/17/2017    The ASCVD Risk score (Goff DC Jr., et al., 2013) failed to calculate for the following reasons:   The 2013 ASCVD risk score is only valid for ages 21 to 62  I reviewed the patients updated PMH, FH, and SocHx.    Patient Active Problem List   Diagnosis Date Noted  . Spinal stenosis of lumbar region without neurogenic claudication 11/26/2016    Priority: High  . Benign essential hypertension 11/26/2016    Priority: High  . Senile osteoporosis 11/26/2016    Priority: High  . Chronic diastolic CHF (congestive heart failure), NYHA class 1 ([place]) 11/26/2016     Priority: High  . GERD 04/03/2007    Priority: High  . B12 deficiency 09/23/2017    Priority: Medium  . Primary hyperparathyroidism ([place]) 03/11/2017    Priority: Medium  . Primary insomnia 11/26/2016    Priority: Medium  . Thoracogenic scoliosis of thoracolumbar region 11/26/2016    Priority: Medium  . Postmenopause atrophic vaginitis 11/26/2016    Priority: Medium  . Rectocele 08/30/2016    Priority: Medium  . Anemia of chronic disease 10/18/2010    Priority: Medium  . Collagenous colitis 04/03/2007    Priority: Medium  . Dry mouth 11/26/2016    Priority: Low  . Mitral regurgitation 02/22/2011    Priority: Low  . Cervicalgia 09/23/2017    Allergies: Actonel [risedronate sodium]; Amoxicillin; Erythromycin; Lipitor [atorvastatin calcium]; and Zetia [ezetimibe]  Social History: Patient  reports that she has never smoked. She has never used smokeless tobacco. She reports  that she drinks alcohol. She reports that she does not use drugs.  Current Meds  Medication Sig  . acetaminophen (TYLENOL) 500 MG tablet Take 500 mg by mouth every 6 (six) hours as needed.  Marland Kitchen BIOTIN PO Take 5,000 mcg by mouth daily.   . budesonide (ENTOCORT EC) 3 MG 24 hr capsule Take 3 capsules (9 mg total) by mouth daily.  Marland Kitchen denosumab (PROLIA) 60 MG/ML SOSY injection Inject into the skin.  Marland Kitchen estradiol (ESTRACE) 0.1 MG/GM vaginal cream Place 1 Applicatorful vaginally at bedtime.  . GuaiFENesin (MUCINEX PO) Take 1 tablet by mouth every 12 (twelve) hours as needed (congestion).   [name] Glycol-Propyl Glycol (SYSTANE OP) Apply to eye.  . ranitidine (ZANTAC) 150 MG tablet Take 1 tablet (150 mg total) by mouth 2 (two) times daily. Replaces Omeprazole ( Prilosec)   Current Facility-Administered Medications for the 09/23/17 encounter (Office Visit) with Leamon Arnt, MD  Medication  . denosumab (PROLIA) injection 60 mg    Review of Systems: Cardiovascular: negative for chest pain, palpitations, leg  swelling, orthopnea Respiratory: negative for SOB, wheezing or persistent cough Gastrointestinal: negative for abdominal pain Genitourinary: negative for dysuria or gross hematuria  Objective  Vitals: BP 120/70   Pulse 75   Temp 97.6 F (36.4 C)   Ht 5\' 2"  (1.575 m)   Wt 123 lb (55.8 kg)   SpO2 98%   BMI 22.50 kg/m  General: no acute distress, continues to look amazing Psych:  Alert and oriented, normal mood and affect HEENT:  Normocephalic, atraumatic, supple neck, TMs clear bilaterally Cardiovascular:  RRR 2/6 systolic murmur, no edema  respiratory:  Good breath sounds bilaterally, CTAB with normal respiratory effort Skin:  Warm, no rashes Neurologic:   Mental status is normal  Commons side effects, risks, benefits, and alternatives for medications and treatment plan prescribed today were discussed, and the patient expressed understanding of the given instructions. Patient is instructed to call or message via MyChart if he/she has any questions or concerns regarding our treatment plan. No barriers to understanding were identified. We discussed Red Flag symptoms and signs in detail. Patient expressed understanding regarding what to do in case of urgent or emergency type symptoms.   Medication list was reconciled, printed and provided to the patient in AVS. Patient instructions and summary information was reviewed with the patient as documented in the AVS. This note was prepared with assistance of Dragon voice recognition software. Occasional wrong-word or sound-a-like substitutions may have occurred due to the inherent limitations of voice recognition software

## 2017-09-23 NOTE — Patient Instructions (Addendum)
Please return in 6 months for your annual complete physical; please come fasting.  I will let you know about your lab results: vit b12, calcium, and blood counts.  Today you were given the flu shot, prolia and Vitamin B12.   Medicare recommends an Annual Wellness Visit for all patients. Please schedule this to be done with our Nurse Educator, Beth Wise. This is an informative "talk" visit; it's goals are to ensure that your health care needs are being met and to give you education regarding avoiding falls, ensuring you are not suffering from depression or problems with memory or thinking, and to educate you on Advance Care Planning. It helps me take good care of you!  If you have any questions or concerns, please don't hesitate to send me a message via MyChart or call the office at [phone]. Thank you for visiting with [place] today! It's our pleasure caring for you.

## 2017-09-23 NOTE — Telephone Encounter (Signed)
Left a message for patient to return my call. 

## 2017-09-24 ENCOUNTER — Telehealth: Payer: Self-pay | Admitting: Internal Medicine

## 2017-09-24 LAB — PTH, INTACT AND CALCIUM
Calcium: 10.4 mg/dL (ref 8.6–10.4)
PTH: 92 pg/mL — ABNORMAL HIGH (ref 14–64)

## 2017-09-24 MED ORDER — FAMOTIDINE 40 MG PO TABS: 40.0000 mg | ORAL_TABLET | Freq: Two times a day (BID) | ORAL | 0 refills | Status: DC

## 2017-09-24 NOTE — Progress Notes (Signed)
Please call patient: I have reviewed his/her lab results. Vit B12 levels are much improved . Anemia is also improved. Can change to every other month Vit 12 injections and recheck in 6 months.  Calcium levels are normal.  However, sodium is low and potassium is high: no clear reason for this. Ensure not taking any otc potassium supplements. Please return on 9/27, early am if possible for repeat BMP. Thanks.     (PTH remains elevated) will monitor.

## 2017-09-24 NOTE — Telephone Encounter (Signed)
I spoke to Beth Wise and her rx has been sent in as she requested. She other phone note.

## 2017-09-24 NOTE — Telephone Encounter (Signed)
I spoke with Beth Wise and she wants to try the generic pepcid 40mg  BID as Dr Carlean Purl approved of to replace her generic Zantac. Confirmed pharmacy. She wants to try a month's worth and will let us know if she wants to continue it.

## 2017-09-25 ENCOUNTER — Other Ambulatory Visit: Payer: Self-pay | Admitting: Emergency Medicine

## 2017-09-25 DIAGNOSIS — M546 Pain in thoracic spine: Secondary | ICD-10-CM | POA: Diagnosis not present

## 2017-09-25 DIAGNOSIS — E875 Hyperkalemia: Secondary | ICD-10-CM

## 2017-09-25 DIAGNOSIS — M62830 Muscle spasm of back: Secondary | ICD-10-CM | POA: Diagnosis not present

## 2017-09-25 DIAGNOSIS — M9902 Segmental and somatic dysfunction of thoracic region: Secondary | ICD-10-CM | POA: Diagnosis not present

## 2017-09-25 DIAGNOSIS — E871 Hypo-osmolality and hyponatremia: Secondary | ICD-10-CM

## 2017-09-25 DIAGNOSIS — M9901 Segmental and somatic dysfunction of cervical region: Secondary | ICD-10-CM | POA: Diagnosis not present

## 2017-09-27 ENCOUNTER — Other Ambulatory Visit (INDEPENDENT_AMBULATORY_CARE_PROVIDER_SITE_OTHER): Payer: Medicare HMO

## 2017-09-27 DIAGNOSIS — M9901 Segmental and somatic dysfunction of cervical region: Secondary | ICD-10-CM | POA: Diagnosis not present

## 2017-09-27 DIAGNOSIS — M9902 Segmental and somatic dysfunction of thoracic region: Secondary | ICD-10-CM | POA: Diagnosis not present

## 2017-09-27 DIAGNOSIS — M62830 Muscle spasm of back: Secondary | ICD-10-CM | POA: Diagnosis not present

## 2017-09-27 DIAGNOSIS — E875 Hyperkalemia: Secondary | ICD-10-CM

## 2017-09-27 DIAGNOSIS — E871 Hypo-osmolality and hyponatremia: Secondary | ICD-10-CM | POA: Diagnosis not present

## 2017-09-27 DIAGNOSIS — M546 Pain in thoracic spine: Secondary | ICD-10-CM | POA: Diagnosis not present

## 2017-09-27 LAB — BASIC METABOLIC PANEL
BUN: 20 mg/dL (ref 6–23)
CO2: 28 mEq/L (ref 19–32)
Calcium: 10.1 mg/dL (ref 8.4–10.5)
Chloride: 97 mEq/L (ref 96–112)
Creatinine, Ser: 0.99 mg/dL (ref 0.40–1.20)
GFR: 55.99 mL/min — ABNORMAL LOW (ref >60.00)
Glucose, Bld: 73 mg/dL (ref 70–99)
Potassium: 5.1 mEq/L (ref 3.5–5.1)
Sodium: 131 mEq/L — ABNORMAL LOW (ref 135–145)

## 2017-09-27 NOTE — Progress Notes (Signed)
Please call patient: I have reviewed his/her lab results. Sodium remains a little low but no need for further eval right now. Will recheck in 3 months with urine studies. Potassium is now normal.

## 2017-09-30 DIAGNOSIS — M9901 Segmental and somatic dysfunction of cervical region: Secondary | ICD-10-CM | POA: Diagnosis not present

## 2017-09-30 DIAGNOSIS — M9902 Segmental and somatic dysfunction of thoracic region: Secondary | ICD-10-CM | POA: Diagnosis not present

## 2017-09-30 DIAGNOSIS — M62830 Muscle spasm of back: Secondary | ICD-10-CM | POA: Diagnosis not present

## 2017-09-30 DIAGNOSIS — M546 Pain in thoracic spine: Secondary | ICD-10-CM | POA: Diagnosis not present

## 2017-10-01 ENCOUNTER — Telehealth: Payer: Self-pay | Admitting: Internal Medicine

## 2017-10-01 NOTE — Telephone Encounter (Signed)
Left message for patient to call back  

## 2017-10-03 NOTE — Telephone Encounter (Signed)
Patient notified that Dr. Carlean Purl does not feel her lack of energy has a GI cause.  She verbalized understanding and states that she feels much better today

## 2017-10-04 DIAGNOSIS — M9901 Segmental and somatic dysfunction of cervical region: Secondary | ICD-10-CM | POA: Diagnosis not present

## 2017-10-04 DIAGNOSIS — M62830 Muscle spasm of back: Secondary | ICD-10-CM | POA: Diagnosis not present

## 2017-10-04 DIAGNOSIS — M546 Pain in thoracic spine: Secondary | ICD-10-CM | POA: Diagnosis not present

## 2017-10-04 DIAGNOSIS — M9902 Segmental and somatic dysfunction of thoracic region: Secondary | ICD-10-CM | POA: Diagnosis not present

## 2017-10-07 DIAGNOSIS — M62830 Muscle spasm of back: Secondary | ICD-10-CM | POA: Diagnosis not present

## 2017-10-07 DIAGNOSIS — M9902 Segmental and somatic dysfunction of thoracic region: Secondary | ICD-10-CM | POA: Diagnosis not present

## 2017-10-07 DIAGNOSIS — M546 Pain in thoracic spine: Secondary | ICD-10-CM | POA: Diagnosis not present

## 2017-10-07 DIAGNOSIS — M9901 Segmental and somatic dysfunction of cervical region: Secondary | ICD-10-CM | POA: Diagnosis not present

## 2017-10-07 NOTE — Progress Notes (Signed)
Please call patient: I have reviewed his/her lab results. Please let her know she is recovering from a prolonged colitis; energy will build back slowly and may or may not return to normal levels. Vit d and Vit B12 levels are now normal. Continue good nutrition and rest and hydration. Remind her that at age 84, things can take longer to resolve. Thanks.  NO further testing recommended at this time.

## 2017-10-10 ENCOUNTER — Telehealth: Payer: Self-pay | Admitting: Internal Medicine

## 2017-10-10 DIAGNOSIS — M62830 Muscle spasm of back: Secondary | ICD-10-CM | POA: Diagnosis not present

## 2017-10-10 DIAGNOSIS — M546 Pain in thoracic spine: Secondary | ICD-10-CM | POA: Diagnosis not present

## 2017-10-10 DIAGNOSIS — M9901 Segmental and somatic dysfunction of cervical region: Secondary | ICD-10-CM | POA: Diagnosis not present

## 2017-10-10 DIAGNOSIS — M9902 Segmental and somatic dysfunction of thoracic region: Secondary | ICD-10-CM | POA: Diagnosis not present

## 2017-10-10 NOTE — Telephone Encounter (Signed)
Pt feel that she's having colitis flare up

## 2017-10-10 NOTE — Telephone Encounter (Signed)
Left message for patient to call back  

## 2017-10-10 NOTE — Telephone Encounter (Signed)
Patient notified that she should resume her lomotil and if her symptoms do not resolve after 2 days she should resume her budesonide 9 mg.  This was discussed with Dr. Carlean Purl

## 2017-10-14 DIAGNOSIS — M9901 Segmental and somatic dysfunction of cervical region: Secondary | ICD-10-CM | POA: Diagnosis not present

## 2017-10-14 DIAGNOSIS — M9902 Segmental and somatic dysfunction of thoracic region: Secondary | ICD-10-CM | POA: Diagnosis not present

## 2017-10-14 DIAGNOSIS — M62830 Muscle spasm of back: Secondary | ICD-10-CM | POA: Diagnosis not present

## 2017-10-14 DIAGNOSIS — M546 Pain in thoracic spine: Secondary | ICD-10-CM | POA: Diagnosis not present

## 2017-10-17 DIAGNOSIS — M9901 Segmental and somatic dysfunction of cervical region: Secondary | ICD-10-CM | POA: Diagnosis not present

## 2017-10-17 DIAGNOSIS — M9902 Segmental and somatic dysfunction of thoracic region: Secondary | ICD-10-CM | POA: Diagnosis not present

## 2017-10-17 DIAGNOSIS — M62830 Muscle spasm of back: Secondary | ICD-10-CM | POA: Diagnosis not present

## 2017-10-17 DIAGNOSIS — M546 Pain in thoracic spine: Secondary | ICD-10-CM | POA: Diagnosis not present

## 2017-10-21 ENCOUNTER — Telehealth: Payer: Self-pay | Admitting: Internal Medicine

## 2017-10-21 DIAGNOSIS — M9901 Segmental and somatic dysfunction of cervical region: Secondary | ICD-10-CM | POA: Diagnosis not present

## 2017-10-21 DIAGNOSIS — M9902 Segmental and somatic dysfunction of thoracic region: Secondary | ICD-10-CM | POA: Diagnosis not present

## 2017-10-21 DIAGNOSIS — M62830 Muscle spasm of back: Secondary | ICD-10-CM | POA: Diagnosis not present

## 2017-10-21 DIAGNOSIS — M546 Pain in thoracic spine: Secondary | ICD-10-CM | POA: Diagnosis not present

## 2017-10-21 NOTE — Telephone Encounter (Signed)
Patient advised that she needs to continue on budesonide until the office visit on 11/27/17

## 2017-10-21 NOTE — Telephone Encounter (Signed)
Patient states her symptoms have subsided from the colitis but she needs to know when to stop taking medication budesonide.

## 2017-10-24 DIAGNOSIS — M546 Pain in thoracic spine: Secondary | ICD-10-CM | POA: Diagnosis not present

## 2017-10-24 DIAGNOSIS — M9901 Segmental and somatic dysfunction of cervical region: Secondary | ICD-10-CM | POA: Diagnosis not present

## 2017-10-24 DIAGNOSIS — M9902 Segmental and somatic dysfunction of thoracic region: Secondary | ICD-10-CM | POA: Diagnosis not present

## 2017-10-24 DIAGNOSIS — M62830 Muscle spasm of back: Secondary | ICD-10-CM | POA: Diagnosis not present

## 2017-10-28 DIAGNOSIS — M9901 Segmental and somatic dysfunction of cervical region: Secondary | ICD-10-CM | POA: Diagnosis not present

## 2017-10-28 DIAGNOSIS — M546 Pain in thoracic spine: Secondary | ICD-10-CM | POA: Diagnosis not present

## 2017-10-28 DIAGNOSIS — M9902 Segmental and somatic dysfunction of thoracic region: Secondary | ICD-10-CM | POA: Diagnosis not present

## 2017-10-28 DIAGNOSIS — M62830 Muscle spasm of back: Secondary | ICD-10-CM | POA: Diagnosis not present

## 2017-10-31 ENCOUNTER — Telehealth: Payer: Self-pay | Admitting: Internal Medicine

## 2017-10-31 ENCOUNTER — Telehealth: Payer: Self-pay | Admitting: Family Medicine

## 2017-10-31 DIAGNOSIS — M9902 Segmental and somatic dysfunction of thoracic region: Secondary | ICD-10-CM | POA: Diagnosis not present

## 2017-10-31 DIAGNOSIS — I10 Essential (primary) hypertension: Secondary | ICD-10-CM | POA: Diagnosis not present

## 2017-10-31 DIAGNOSIS — M810 Age-related osteoporosis without current pathological fracture: Secondary | ICD-10-CM | POA: Diagnosis not present

## 2017-10-31 DIAGNOSIS — Z6822 Body mass index (BMI) 22.0-22.9, adult: Secondary | ICD-10-CM | POA: Diagnosis not present

## 2017-10-31 DIAGNOSIS — M25551 Pain in right hip: Secondary | ICD-10-CM | POA: Diagnosis not present

## 2017-10-31 DIAGNOSIS — M549 Dorsalgia, unspecified: Secondary | ICD-10-CM | POA: Diagnosis not present

## 2017-10-31 DIAGNOSIS — M62830 Muscle spasm of back: Secondary | ICD-10-CM | POA: Diagnosis not present

## 2017-10-31 DIAGNOSIS — M546 Pain in thoracic spine: Secondary | ICD-10-CM | POA: Diagnosis not present

## 2017-10-31 DIAGNOSIS — M419 Scoliosis, unspecified: Secondary | ICD-10-CM | POA: Diagnosis not present

## 2017-10-31 DIAGNOSIS — M9901 Segmental and somatic dysfunction of cervical region: Secondary | ICD-10-CM | POA: Diagnosis not present

## 2017-10-31 MED ORDER — FAMOTIDINE 40 MG PO TABS: 40.0000 mg | ORAL_TABLET | Freq: Two times a day (BID) | ORAL | 1 refills | Status: DC

## 2017-10-31 NOTE — Telephone Encounter (Signed)
Tylenol does not come in 800mg  tablets.  She is on the correct medication, Tylenol ES twice a day.   Ibuprofen comes in 800mg ; I do not recommend taking that.

## 2017-10-31 NOTE — Telephone Encounter (Signed)
Patient informed and verbalized understanding  Doloris Hall,  LPN

## 2017-10-31 NOTE — Telephone Encounter (Signed)
Copied from [place] [id number]. Topic: General - Other >> Oct 31, 2017 10:14 AM Leward Quan A wrote: Reason for CRM: Patient called to say that she has been taking 2 500 mg of Tylenol and stated that she was told that there is an 800 mg that can be prescribed and she would like an Rx sent to the pharmacy please so she can take one pill instead of two for her back pain. Please advise.    Ph# [phone]

## 2017-10-31 NOTE — Telephone Encounter (Signed)
It doesn't come any higher. There is no prescription tylenol.

## 2017-10-31 NOTE — Telephone Encounter (Signed)
Pepcid refill sent in as requested.

## 2017-10-31 NOTE — Telephone Encounter (Signed)
Copied from [place] [phone]. Topic: General - Other >> Oct 31, 2017 10:14 AM Leward Quan A wrote: Reason for CRM: Patient called to say that she has been taking 2 500 mg of Tylenol and stated that she was told that there is an 800 mg that can be prescribed and she would like an Rx sent to the pharmacy please so she can take one pill instead of two for her back pain. Please advise.    Ph# [phone]

## 2017-10-31 NOTE — Telephone Encounter (Signed)
Patient was given Dr. Tamela Oddi message but she still wants a prescription for Tylenol if they have a dosage higher than 500mg .

## 2017-11-01 ENCOUNTER — Telehealth: Payer: Self-pay | Admitting: Internal Medicine

## 2017-11-01 NOTE — Telephone Encounter (Signed)
Tried to reach patient and her voicemail says her mailbox has not been set up. Will try again on Monday.

## 2017-11-01 NOTE — Telephone Encounter (Signed)
Pt would like to speak with you regarding some medications, she did not want to specified.

## 2017-11-04 DIAGNOSIS — M62830 Muscle spasm of back: Secondary | ICD-10-CM | POA: Diagnosis not present

## 2017-11-04 DIAGNOSIS — M9901 Segmental and somatic dysfunction of cervical region: Secondary | ICD-10-CM | POA: Diagnosis not present

## 2017-11-04 DIAGNOSIS — M546 Pain in thoracic spine: Secondary | ICD-10-CM | POA: Diagnosis not present

## 2017-11-04 DIAGNOSIS — M9902 Segmental and somatic dysfunction of thoracic region: Secondary | ICD-10-CM | POA: Diagnosis not present

## 2017-11-04 NOTE — Telephone Encounter (Signed)
Beth Wise wanted Dr Carlean Purl to know that she is on a prednisone 5mg  dose pak for 6 days for her back. She fell in August. Dx- Bursitis per Dr Patrice Paradise. She is coming to see Dr Carlean Purl at the end of the month and she is doing well and she wants to be off or at least cut down on her budesonide before her late November appointment.  Please advise [name].

## 2017-11-05 NOTE — Telephone Encounter (Signed)
Patient informed and will go from 9mg  to 6mg  daily of her budesonide and she will see [place] on Nov. 26th to discuss any further changes.

## 2017-11-05 NOTE — Telephone Encounter (Signed)
I suggest she reduce the budesonide by 3 mg a day (If on 9 take 6 and if on 6 take 3) but Would like to assess her after that change and not go lower

## 2017-11-06 NOTE — Telephone Encounter (Signed)
Pt has concern about the budesonide.

## 2017-11-06 NOTE — Telephone Encounter (Signed)
Left her a message to call me back. 

## 2017-11-07 DIAGNOSIS — M9901 Segmental and somatic dysfunction of cervical region: Secondary | ICD-10-CM | POA: Diagnosis not present

## 2017-11-07 DIAGNOSIS — M9902 Segmental and somatic dysfunction of thoracic region: Secondary | ICD-10-CM | POA: Diagnosis not present

## 2017-11-07 DIAGNOSIS — M546 Pain in thoracic spine: Secondary | ICD-10-CM | POA: Diagnosis not present

## 2017-11-07 DIAGNOSIS — M62830 Muscle spasm of back: Secondary | ICD-10-CM | POA: Diagnosis not present

## 2017-11-07 NOTE — Telephone Encounter (Signed)
Beth Wise had questions about the budesonide. She is finishing her prednisone. She is keeping a journal of her bowel movements for her upcoming office visit with Dr Carlean Purl. She is concerned that the budesonide is causing "brain fog".

## 2017-11-08 NOTE — Telephone Encounter (Signed)
She sent [place] some pictures of her arms and per Dr Carlean Purl she should go see her PCP or Dermatologist for a dx. She understood.

## 2017-11-11 DIAGNOSIS — M62830 Muscle spasm of back: Secondary | ICD-10-CM | POA: Diagnosis not present

## 2017-11-11 DIAGNOSIS — M9901 Segmental and somatic dysfunction of cervical region: Secondary | ICD-10-CM | POA: Diagnosis not present

## 2017-11-11 DIAGNOSIS — M9902 Segmental and somatic dysfunction of thoracic region: Secondary | ICD-10-CM | POA: Diagnosis not present

## 2017-11-11 DIAGNOSIS — M546 Pain in thoracic spine: Secondary | ICD-10-CM | POA: Diagnosis not present

## 2017-11-14 DIAGNOSIS — M546 Pain in thoracic spine: Secondary | ICD-10-CM | POA: Diagnosis not present

## 2017-11-14 DIAGNOSIS — M9901 Segmental and somatic dysfunction of cervical region: Secondary | ICD-10-CM | POA: Diagnosis not present

## 2017-11-14 DIAGNOSIS — M9902 Segmental and somatic dysfunction of thoracic region: Secondary | ICD-10-CM | POA: Diagnosis not present

## 2017-11-14 DIAGNOSIS — M62830 Muscle spasm of back: Secondary | ICD-10-CM | POA: Diagnosis not present

## 2017-11-18 ENCOUNTER — Other Ambulatory Visit: Payer: Self-pay | Admitting: Family Medicine

## 2017-11-18 NOTE — Telephone Encounter (Signed)
Copied from [place] [id number]. Topic: Quick Communication - Rx Refill/Question >> Nov 18, 2017 10:38 AM Antonieta Iba C wrote: Medication: ipratropium (ATROVENT) 0.03 % nasal spray, fluticasone (FLONASE) 50 MCG/ACT nasal spray  and also Hydrocodone cream 2 1/2% - pt says that these are Rx that she had with her previous provider.   Has the patient contacted their pharmacy? No   (Agent: If no, request that the patient contact the pharmacy for the refill.) (Agent: If yes, when and what did the pharmacy advise?)  Preferred Pharmacy (with phone number or street name): [facility] [id number] - [place], [place]  Agent: Please be advised that RX refills may take up to 3 business days. We ask that you follow-up with your pharmacy.

## 2017-11-19 MED ORDER — IPRATROPIUM BROMIDE 0.03 % NA SOLN: 1.0000 | Freq: Two times a day (BID) | NASAL | 1 refills | Status: DC

## 2017-11-19 MED ORDER — FLUTICASONE PROPIONATE 50 MCG/ACT NA SUSP: 2.0000 | Freq: Every day | NASAL | 3 refills | Status: DC

## 2017-11-19 NOTE — Telephone Encounter (Signed)
Requested medication (s) are due for refill today: flonase ?  Requested medication (s) are on the active medication list: yes  Last refill:  ?  Future visit scheduled: yes  Notes to clinic: prescription written by historical provider 07/02/10   Requested medication (s) are due for refill today: ipratropium ?  Requested medication (s) are on the active medication list: yes  Last refill:  ?  Future visit scheduled: yes  Notes to clinic: prescription written by historical provider 01/14/13    Requested Prescriptions  Pending Prescriptions Disp Refills   ipratropium (ATROVENT) 0.03 % nasal spray 30 mL     Sig: As directed nasal spray     Off-Protocol Failed - 11/19/2017 11:37 AM      Failed - Medication not assigned to a protocol, review manually.      Passed - Valid encounter within last 12 months    Recent Outpatient Visits          1 month ago Benign essential hypertension   [facility]rap[facility], [facility] [place], [place]   5 months ago Dyspnea on exertion   [place] [facility] [place], [place]   6 months ago [place] [facility] [place], [place]   8 months ago Benign essential hypertension   [place] [facility] [place], MD   1 year ago Vaginal irritation   Therapist, [facility] at [employer], Nickola Major, DO      Future Appointments            In 2 weeks  [facility], [place]   In 2 weeks Leamon Arnt, MD [place] [facility] [place], [facility]          fluticasone [facility]) 50 MCG/ACT nasal spray      Sig: 1 spray as needed. Sinus drainage     Ear, Nose, and Throat: Nasal Preparations - Corticosteroids Passed - 11/19/2017 11:37 AM      Passed - Valid encounter within last 12 months    Recent Outpatient Visits          1 month ago Benign essential hypertension   [place] [facility] [place], [place]   5 months ago Dyspnea on exertion   [place] [facility] [place], [place]   6 months ago [place] [facility] [place], MD   8 months ago Benign essential hypertension   Therapist, [facility] [place], MD   1 year ago Vaginal irritation   Therapist, music at [employer], Nickola Major, DO      Future Appointments            In 2 weeks  [facility] [place], [place]   In 2 weeks Leamon Arnt, MD [place] [facility] [place], [facility]

## 2017-11-19 NOTE — Telephone Encounter (Signed)
Requested medication (s) are due for refill today: yes  Requested medication (s) are on the active medication list: yes  Last refill:  Previously filled by a different provider  Future visit scheduled: yes  Notes to clinic:  Pt requesting refills on medications previously filled by a different provider    Requested Prescriptions  Pending Prescriptions Disp Refills   ipratropium (ATROVENT) 0.03 % nasal spray 30 mL     Sig: As directed nasal spray     Off-Protocol Failed - 11/19/2017 11:37 AM      Failed - Medication not assigned to a protocol, review manually.      Passed - Valid encounter within last 12 months    Recent Outpatient Visits          1 month ago Benign essential hypertension   [facility]rap[facility] [place], [place]   5 months ago Dyspnea on exertion   [place] [facility] [place], MD   6 months ago [place] [facility] [place], MD   8 months ago Benign essential hypertension   [place] [facility] [place], MD   1 year ago Vaginal irritation   Therapist, [facility] at [employer], Nickola Major, DO      Future Appointments            In 2 weeks  [facility], [place]   In 2 weeks Leamon Arnt, MD [place] [facility] [place], [facility]          fluticasone [facility]) 50 MCG/ACT nasal spray      Sig: 1 spray as needed. Sinus drainage     Ear, Nose, and Throat: Nasal Preparations - Corticosteroids Passed - 11/19/2017 11:37 AM      Passed - Valid encounter within last 12 months    Recent Outpatient Visits          1 month ago Benign essential hypertension   [place] [facility] [place], MD   5 months ago Dyspnea on exertion   [place] [facility] [place], MD   6 months  ago [place] [facility] [place], MD   8 months ago Benign essential hypertension   Therapist, [facility] [place], MD   1 year ago Vaginal irritation   Therapist, music at [employer], Nickola Major, DO      Future Appointments            In 2 weeks  [facility] [place], [place]   In 2 weeks Leamon Arnt, MD [place] [facility] [place], [facility]

## 2017-11-21 DIAGNOSIS — M9902 Segmental and somatic dysfunction of thoracic region: Secondary | ICD-10-CM | POA: Diagnosis not present

## 2017-11-21 DIAGNOSIS — M546 Pain in thoracic spine: Secondary | ICD-10-CM | POA: Diagnosis not present

## 2017-11-21 DIAGNOSIS — M9901 Segmental and somatic dysfunction of cervical region: Secondary | ICD-10-CM | POA: Diagnosis not present

## 2017-11-21 DIAGNOSIS — M62830 Muscle spasm of back: Secondary | ICD-10-CM | POA: Diagnosis not present

## 2017-11-27 ENCOUNTER — Encounter: Payer: Self-pay | Admitting: Internal Medicine

## 2017-11-27 ENCOUNTER — Ambulatory Visit: Payer: Medicare HMO | Admitting: Internal Medicine

## 2017-11-27 VITALS — BP 132/70 | HR 88 | Ht 62.0 in | Wt 123.1 lb

## 2017-11-27 DIAGNOSIS — R233 Spontaneous ecchymoses: Secondary | ICD-10-CM

## 2017-11-27 DIAGNOSIS — Z8669 Personal history of other diseases of the nervous system and sense organs: Secondary | ICD-10-CM

## 2017-11-27 DIAGNOSIS — H538 Other visual disturbances: Secondary | ICD-10-CM | POA: Diagnosis not present

## 2017-11-27 DIAGNOSIS — K52831 Collagenous colitis: Secondary | ICD-10-CM | POA: Diagnosis not present

## 2017-11-27 DIAGNOSIS — H1851 Endothelial corneal dystrophy: Secondary | ICD-10-CM | POA: Diagnosis not present

## 2017-11-27 DIAGNOSIS — R238 Other skin changes: Secondary | ICD-10-CM

## 2017-11-27 DIAGNOSIS — H18519 Endothelial corneal dystrophy, unspecified eye: Secondary | ICD-10-CM

## 2017-11-27 NOTE — Assessment & Plan Note (Addendum)
She is in remission right now on 3 mg daily.  She will try to taper that every other day for the next month and she wants to see if she can get off of the medication.  She did have a long run without problems or perhaps she can be in remission again without medication though I have my doubts.  If that does not work we will restart it.  She is complained of blurry vision which I am not convinced is related to the collagenous colitis treatment with budesonide though she relates it to that.  I have asked her to call her ophthalmologist, Dr. Ellie Lunch to be seen for a follow-up visit regarding this blurry vision.

## 2017-11-27 NOTE — Patient Instructions (Addendum)
Decrease Budesonide to 1 capsule (3mg ) every other day. Then stop after I month  Please give [place] a call back with any problems.  Call Dr Benna Dunks office and get an appointment for your blurred vision  I appreciate the opportunity to care for you. Silvano Rusk, MD, [facility]

## 2017-11-27 NOTE — Progress Notes (Signed)
Beth Wise 84 y.o. 02-25-1925 [id number]  Assessment & Plan:   Encounter Diagnoses  Name Primary?  . Collagenous colitis Yes  . Blurry vision   . Easy bruising   . Fuchs' corneal dystrophy   . History of blepharitis    Collagenous colitis She is in remission right now on 3 mg daily.  She will try to taper that every other day for the next month and she wants to see if she can get off of the medication.  She did have a long run without problems or perhaps she can be in remission again without medication though I have my doubts.  If that does not work we will restart it.  She is complained of blurry vision which I am not convinced is related to the collagenous colitis treatment with budesonide though she relates it to that.  I have asked her to call her ophthalmologist, Dr. Ellie Lunch to be seen for a follow-up visit regarding this blurry vision.   I appreciate the opportunity to care for this patient. CC: Leamon Arnt, MD Dr. Luberta Mutter Subjective:   Chief Complaint: Follow-up of collagenous colitis  HPI Beth Wise is here reporting that she is having formed bowel movements and doing well on 3 mg budesonide daily.  She had a rash or some easy bruising that not so much of an issue now.  She thinks her vision has been blurry since restarting budesonide.  She has a history of blepharitis and Fuchs corneal dystrophy.  Recently thought that [place] eyedrops were burning and had an antibiotic steroid eyedrop called in by ophthalmology and she was concerned because she had not seen anybody over there and they made the change over the phone.  I asked her if she had called to make an appointment and she said she did not have an appointment till April and I explained that that was her routine follow-up appointment.  I have explained that I do not often see blurry vision from budesonide treatment and that it makes sense for her to follow-up with ophthalmology.  Wt Readings from Last 3  Encounters:  11/27/17 123 lb 2 oz (55.8 kg)  09/23/17 123 lb (55.8 kg)  08/30/17 122 lb (55.3 kg)    Allergies  Allergen Reactions  . Actonel [Risedronate Sodium] Other (See Comments)    Joint pain  . Amoxicillin Diarrhea  . Erythromycin Other (See Comments)    Gi upset  . Lipitor [Atorvastatin Calcium]   . Zetia [Ezetimibe]     Joint pain   Current Meds  Medication Sig  . acetaminophen (TYLENOL) 500 MG tablet Take 500 mg by mouth every 6 (six) hours as needed.  Marland Kitchen BIOTIN PO Take 5,000 mcg by mouth daily.   . budesonide (ENTOCORT EC) 3 MG 24 hr capsule Take 3 mg by mouth every other day.  . Cholecalciferol (VITAMIN D) 2000 units CAPS Take by mouth.  . denosumab (PROLIA) 60 MG/ML SOSY injection Inject into the skin.  Marland Kitchen estradiol (ESTRACE) 0.1 MG/GM vaginal cream Place 1 Applicatorful vaginally at bedtime.  . famotidine (PEPCID) 40 MG tablet Take 1 tablet (40 mg total) by mouth 2 (two) times daily.  . flurazepam (DALMANE) 15 MG capsule Take 15 mg by mouth. 2-3 times a week.  . fluticasone (FLONASE) 50 MCG/ACT nasal spray Place 2 sprays into both nostrils daily. Sinus drainage  . GuaiFENesin (MUCINEX PO) Take 1 tablet by mouth every 12 (twelve) hours as needed (congestion).   Marland Kitchen ipratropium (ATROVENT)  0.03 % nasal spray Place 1 spray into both nostrils 2 (two) times daily. As directed nasal spray  . Melatonin 5 MG TABS Take by mouth. Take 1 tab 4-5 times a week  . tobramycin-dexamethasone (TOBRADEX) ophthalmic solution Place 1 drop into both eyes as needed.   Past Medical History:  Diagnosis Date  . Allergy   . Arthritis   . Back pain   . Blepharitis   . Cancer ([place])    basal cell on face  . Candidal esophagitis ([place]) 2010  . Cataract   . Chest pain, atypical   . Collagenous colitis    Dr. Maurene Capes  . Diverticulosis   . Fuchs' corneal dystrophy   . GERD (gastroesophageal reflux disease)   . Heart murmur   . Hiatal hernia 2010  . History of fibromyalgia   . Hypercalcemia     . Hyperlipidemia   . Hypertension   . IBS (irritable bowel syndrome)   . Insomnia   . Osteoporosis   . Primary hyperparathyroidism ([place]) 03/11/2017  . Rectocele   . Scoliosis   . Shingles 1975   at waistline, no chronic pain.    Marland Kitchen Spinal stenosis   . Vaginal prolapse    Past Surgical History:  Procedure Laterality Date  . ABDOMINAL HYSTERECTOMY    . BREAST BIOPSY  1964   left benign  . CARDIOVASCULAR STRESS TEST  12/03/2007   EF 72%  . CATARACT EXTRACTION W/ INTRAOCULAR LENS  IMPLANT, BILATERAL    . COLONOSCOPY  04/24/2006   diverticulosis  . FLEXIBLE SIGMOIDOSCOPY  07/27/2010   diverticulosis, collagenous colitis  . TONSILLECTOMY AND ADENOIDECTOMY    . UPPER GASTROINTESTINAL ENDOSCOPY  01/29/2008   esophagitis, hiatal hernia, gastritis  . US ECHOCARDIOGRAPHY  10/30/2007    EF 55-60%   Review of Systems As above  Objective:   Physical Exam BP 132/70 (BP Location: Left Arm, Patient Position: Sitting, Cuff Size: Normal)   Pulse 88   Ht 5\' 2"  (1.575 m)   Wt 123 lb 2 oz (55.8 kg)   BMI 22.52 kg/m  No acute distress looking younger than stated age  68 minutes time spent with patient > half in counseling coordination of care

## 2017-12-02 ENCOUNTER — Telehealth: Payer: Self-pay | Admitting: Internal Medicine

## 2017-12-02 NOTE — Telephone Encounter (Signed)
There is a sell on Budesonide from [place] today only , $25 off. She wants to know if you think she should order. She said if you think you may change her therapy down the road she won't stock up. She took 2 yesterday and today having a good day. She is currently suppose to be on every other day for a month and stop according to the last office note. Please advise [name]? Thank you.

## 2017-12-02 NOTE — Telephone Encounter (Signed)
Patient informed. 

## 2017-12-02 NOTE — Telephone Encounter (Signed)
I think it makes sense to purchase more but it is up to her

## 2017-12-03 DIAGNOSIS — H40013 Open angle with borderline findings, low risk, bilateral: Secondary | ICD-10-CM | POA: Diagnosis not present

## 2017-12-03 DIAGNOSIS — H1851 Endothelial corneal dystrophy: Secondary | ICD-10-CM | POA: Diagnosis not present

## 2017-12-03 DIAGNOSIS — Z961 Presence of intraocular lens: Secondary | ICD-10-CM | POA: Diagnosis not present

## 2017-12-04 DIAGNOSIS — M9902 Segmental and somatic dysfunction of thoracic region: Secondary | ICD-10-CM | POA: Diagnosis not present

## 2017-12-04 DIAGNOSIS — M62830 Muscle spasm of back: Secondary | ICD-10-CM | POA: Diagnosis not present

## 2017-12-04 DIAGNOSIS — M9901 Segmental and somatic dysfunction of cervical region: Secondary | ICD-10-CM | POA: Diagnosis not present

## 2017-12-04 DIAGNOSIS — M546 Pain in thoracic spine: Secondary | ICD-10-CM | POA: Diagnosis not present

## 2017-12-05 NOTE — Progress Notes (Signed)
Subjective:   Beth Wise is a 84 y.o. female who presents for Medicare Annual (Subsequent) preventive examination.  Review of Systems:  No ROS.  Medicare Wellness Visit. Additional risk factors are reflected in the social history.  Cardiac Risk Factors include: advanced age (>29men, >69 women);hypertension;dyslipidemia   Sleep patterns: Sleeps 4-5 hours.  Home Safety/Smoke Alarms: Feels safe in home. Smoke alarms in place.  Living environment; residence and Firearm Safety: Lives alone in first floor [facility].  Seat Belt Safety/Bike Helmet: Wears seat belt.   Female:   Pap-N/A       Mammo-09/09/2017, negative.       Dexa scan-09/09/2017, Osteoporosis.        CCS-Colonoscopy 08/07/2017, normal.        Objective:     Vitals: BP 120/64 (BP Location: Left Arm, Patient Position: Sitting, Cuff Size: Normal)   Pulse 96   Temp 97.7 F (36.5 C) (Temporal)   Resp 16   Ht 5\' 2"  (1.575 m)   Wt 122 lb 6 oz (55.5 kg)   SpO2 97%   BMI 22.38 kg/m   Body mass index is 22.38 kg/m.  Advanced Directives 12/06/2017 11/27/2016 11/07/2016  Does Patient Have a Medical Advance Directive? Yes Yes Yes  Type of Paramedic of [place];Living will [place];Out of facility DNR (pink MOST or yellow form) [place];Living will  Does patient want to make changes to medical advance directive? - No - Patient declined -  Copy of [place] in Chart? Yes - validated most recent copy scanned in chart (See row information) Yes No - copy requested  Pre-existing out of facility DNR order (yellow form or pink MOST form) - Pink MOST form placed in chart (order not valid for inpatient use);Yellow form placed in chart (order not valid for inpatient use) Pink MOST form placed in chart (order not valid for inpatient use)    Tobacco Social History   Tobacco Use  Smoking Status Never Smoker  Smokeless Tobacco Never Used       Counseling given: Not Answered   Past Medical History:  Diagnosis Date  . Allergy   . Arthritis   . Back pain   . Blepharitis   . Cancer ([place])    basal cell on face  . Candidal esophagitis ([place]) 2010  . Cataract   . Chest pain, atypical   . Collagenous colitis    Dr. Maurene Capes  . Diverticulosis   . Fuchs' corneal dystrophy   . GERD (gastroesophageal reflux disease)   . Heart murmur   . Hiatal hernia 2010  . History of fibromyalgia   . Hypercalcemia   . Hyperlipidemia   . Hypertension   . IBS (irritable bowel syndrome)   . Insomnia   . Osteoporosis   . Primary hyperparathyroidism ([place]) 03/11/2017  . Rectocele   . Scoliosis   . Shingles 1975   at waistline, no chronic pain.    Marland Kitchen Spinal stenosis   . Vaginal prolapse    Past Surgical History:  Procedure Laterality Date  . ABDOMINAL HYSTERECTOMY    . BREAST BIOPSY  1964   left benign  . CARDIOVASCULAR STRESS TEST  12/03/2007   EF 72%  . CATARACT EXTRACTION W/ INTRAOCULAR LENS  IMPLANT, BILATERAL    . COLONOSCOPY  04/24/2006   diverticulosis  . FLEXIBLE SIGMOIDOSCOPY  07/27/2010   diverticulosis, collagenous colitis  . TONSILLECTOMY AND ADENOIDECTOMY    . UPPER GASTROINTESTINAL ENDOSCOPY  01/29/2008   esophagitis, hiatal hernia, gastritis  . US ECHOCARDIOGRAPHY  10/30/2007    EF 55-60%   Family History  Problem Relation Age of Onset  . Breast cancer Mother        died at 10  . Stroke Mother   . Heart disease Father        died at 84  . Cirrhosis Sister        died at 44  . Heart disease Brother        died at 92  . Pneumonia Sister        died at 42 months  . Heart disease Sister        died at 41  . Cancer - Other Sister        stomach; died at 34  . Stomach cancer Sister   . Heart disease Brother        c-diff and sepsis; died at 53  . Colon cancer Neg Hx   . Diabetes Neg Hx   . Rectal cancer Neg Hx   . Esophageal cancer Neg Hx    Social History   Socioeconomic History  . Marital status: Widowed     Spouse name: Not on file  . Number of children: 2  . Years of education: Not on file  . Highest education level: Not on file  Occupational History  . Occupation: retired    Comment: owned Human resources officer  Social Needs  . Financial resource strain: Not hard at all  . Food insecurity:    Worry: Never true    Inability: Never true  . Transportation needs:    Medical: No    Non-medical: No  Tobacco Use  . Smoking status: Never Smoker  . Smokeless tobacco: Never Used  Substance and Sexual Activity  . Alcohol use: Yes    Comment: rarely  . Drug use: No  . Sexual activity: Never  Lifestyle  . Physical activity:    Days per week: 7 days    Minutes per session: 10 min  . Stress: Only a little  Relationships  . Social connections:    Talks on phone: More than three times a week    Gets together: More than three times a week    Attends religious service: 1 to 4 times per year    Active member of club or organization: Yes    Attends meetings of clubs or organizations: Never    Relationship status: Widowed  Other Topics Concern  . Not on file  Social History Narrative   Social History      Diet? Regular (shouldn't have excessive amts of acid, hot &spicey foods)      Do you drink/eat things with caffeine? yes      Marital status?       widow                             What year were you married? [place] you live in a house, apartment, assisted living, condo, trailer, etc.? apartment      Is it one or more stories? 1      How many persons live in your home? 1 (self)      Do you have any pets in your home? (please list) no no !!      Highest level of education completed?      Current or past  profession: hairdresser      Do you exercise?      little                                Type & how often? Leg lifts and plank, stretching every day.      Advanced Directives      Do you have a living will?  yes      Do you have a DNR form?         yes                          If not, do you want to discuss one? yes      Do you have signed POA/HPOA for forms? yes      Functional Status      Do you have difficulty bathing or dressing yourself?      Do you have difficulty preparing food or eating?       Do you have difficulty managing your medications?      Do you have difficulty managing your finances?      Do you have difficulty affording your medications?    Outpatient Encounter Medications as of 12/06/2017  Medication Sig  . acetaminophen (TYLENOL) 500 MG tablet Take 500 mg by mouth every 6 (six) hours as needed.  Marland Kitchen BIOTIN PO Take 5,000 mcg by mouth daily.   . budesonide (ENTOCORT EC) 3 MG 24 hr capsule Take 3 mg by mouth every other day.  . Cholecalciferol (VITAMIN D) 2000 units CAPS Take by mouth.  . denosumab (PROLIA) 60 MG/ML SOSY injection Inject into the skin.  Marland Kitchen estradiol (ESTRACE) 0.1 MG/GM vaginal cream Place 1 Applicatorful vaginally at bedtime.  . famotidine (PEPCID) 40 MG tablet Take 1 tablet (40 mg total) by mouth 2 (two) times daily.  . flurazepam (DALMANE) 15 MG capsule Take 15 mg by mouth. 2-3 times a week.  . fluticasone (FLONASE) 50 MCG/ACT nasal spray Place 2 sprays into both nostrils daily. Sinus drainage  . GuaiFENesin (MUCINEX PO) Take 1 tablet by mouth every 12 (twelve) hours as needed (congestion).   Marland Kitchen ipratropium (ATROVENT) 0.03 % nasal spray Place 1 spray into both nostrils 2 (two) times daily. As directed nasal spray  . Melatonin 5 MG TABS Take by mouth. Take 1 tab 4-5 times a week  . tobramycin-dexamethasone (TOBRADEX) ophthalmic solution Place 1 drop into both eyes as needed.   No facility-administered encounter medications on file as of 12/06/2017.     Activities of Daily Living In your present state of health, do you have any difficulty performing the following activities: 12/06/2017 03/04/2017  Hearing? N N  Vision? N Y  Difficulty concentrating or making decisions? N N  Walking or climbing stairs? N N  Dressing  or bathing? N N  Doing errands, shopping? N N  Preparing Food and eating ? N -  Using the Toilet? N -  In the past six months, have you accidently leaked urine? N -  Do you have problems with loss of bowel control? N -  Managing your Medications? N -  Managing your Finances? N -  Housekeeping or managing your Housekeeping? N -  Some recent data might be hidden    Patient Care Team: Leamon Arnt, MD as PCP - General (Family Medicine) Rolm Bookbinder, MD as Consulting Physician (Dermatology) Marti Sleigh, MD as Consulting  Physician (Gynecology) Stanford Breed, Denice Bors, MD as Consulting Physician (Cardiology) Luberta Mutter, MD as Consulting Physician (Ophthalmology) Jola Baptist, [place] as Referring Physician (Chiropractic Medicine) Neomia Dear (Chiropractic Medicine) Starling Manns, MD (Orthopedic Surgery)    Assessment:   This is a routine wellness examination for Aaliyha.  Exercise Activities and Dietary recommendations Current Exercise Habits: The patient does not participate in regular exercise at present(stretches in bed), Exercise limited by: None identified   Diet (meal preparation, eat out, water intake, caffeinated beverages, dairy products, fruits and vegetables): Drinks water  Breakfast: rice cereal, blueberries, coffee Lunch: flounder with crab; chicken dish; soup; vegetables Dinner: protein and vegetables  Goals    . DIET - INCREASE WATER INTAKE     Patient will use 3 thermoses to drink more water.    . Patient Stated     Stay active.        Fall Risk Fall Risk  12/06/2017 03/04/2017 11/27/2016 11/07/2016  Falls in the past year? 1 No No No  Number falls in past yr: 0 - - -  Injury with Fall? 0 - - -  Risk for fall due to : Impaired balance/gait - - -  Follow up Falls prevention discussed - - -    Depression Screen PHQ 2/9 Scores 12/06/2017 03/04/2017 11/27/2016 11/07/2016  PHQ - 2 Score 0 0 0 0  PHQ- 9 Score - 0 - -     Cognitive Function MMSE -  Mini Mental State Exam 12/06/2017 11/27/2016  Orientation to time 5 5  Orientation to Place 5 5  Registration 3 3  Attention/ Calculation 5 5  Recall 2 2  Language- name 2 objects 2 2  Language- repeat 1 1  Language- follow 3 step command 3 3  Language- read & follow direction 1 1  Write a sentence 1 1  Copy design 1 1  Total score 29 29        Immunization History  Administered Date(s) Administered  . Influenza, High Dose Seasonal PF 09/23/2017  . Influenza-Unspecified 09/01/2015, 10/22/2016  . Pneumococcal Conjugate-13 11/07/2016  . Pneumococcal-Unspecified 01/02/2015  . Td 01/01/2010  . Zoster Recombinat (Shingrix) 02/14/2017, 04/17/2017     Screening Tests Health Maintenance  Topic Date Due  . MAMMOGRAM  09/10/2018  . DEXA SCAN  09/10/2019  . TETANUS/TDAP  01/02/2020  . INFLUENZA VACCINE  Completed  . PNA vac Low Risk Adult  Completed        Plan:    Continue doing brain stimulating activities (puzzles, reading, adult coloring books, staying active) to keep memory sharp.   I have personally reviewed and noted the following in the patient's chart:   . Medical and social history . Use of alcohol, tobacco or illicit drugs  . Current medications and supplements . Functional ability and status . Nutritional status . Physical activity . Advanced directives . List of other physicians . Hospitalizations, surgeries, and ER visits in previous 12 months . Vitals . Screenings to include cognitive, depression, and falls . Referrals and appointments  In addition, I have reviewed and discussed with patient certain preventive protocols, quality metrics, and best practice recommendations. A written personalized care plan for preventive services as well as general preventive health recommendations were provided to patient.     Gerilyn Nestle, RN  12/06/2017

## 2017-12-06 ENCOUNTER — Other Ambulatory Visit: Payer: Self-pay

## 2017-12-06 ENCOUNTER — Ambulatory Visit (INDEPENDENT_AMBULATORY_CARE_PROVIDER_SITE_OTHER): Payer: Medicare HMO

## 2017-12-06 ENCOUNTER — Ambulatory Visit (INDEPENDENT_AMBULATORY_CARE_PROVIDER_SITE_OTHER): Payer: Medicare HMO | Admitting: Family Medicine

## 2017-12-06 ENCOUNTER — Encounter: Payer: Self-pay | Admitting: Family Medicine

## 2017-12-06 VITALS — BP 120/64 | HR 96 | Temp 97.7°F | Resp 16 | Ht 62.0 in | Wt 122.4 lb

## 2017-12-06 DIAGNOSIS — F5101 Primary insomnia: Secondary | ICD-10-CM

## 2017-12-06 DIAGNOSIS — Z0000 Encounter for general adult medical examination without abnormal findings: Secondary | ICD-10-CM

## 2017-12-06 DIAGNOSIS — I10 Essential (primary) hypertension: Secondary | ICD-10-CM

## 2017-12-06 DIAGNOSIS — E559 Vitamin D deficiency, unspecified: Secondary | ICD-10-CM | POA: Diagnosis not present

## 2017-12-06 DIAGNOSIS — E538 Deficiency of other specified B group vitamins: Secondary | ICD-10-CM

## 2017-12-06 DIAGNOSIS — E210 Primary hyperparathyroidism: Secondary | ICD-10-CM | POA: Diagnosis not present

## 2017-12-06 DIAGNOSIS — Z66 Do not resuscitate: Secondary | ICD-10-CM | POA: Insufficient documentation

## 2017-12-06 DIAGNOSIS — K52831 Collagenous colitis: Secondary | ICD-10-CM | POA: Diagnosis not present

## 2017-12-06 DIAGNOSIS — R69 Illness, unspecified: Secondary | ICD-10-CM | POA: Diagnosis not present

## 2017-12-06 DIAGNOSIS — M810 Age-related osteoporosis without current pathological fracture: Secondary | ICD-10-CM

## 2017-12-06 MED ORDER — FLURAZEPAM HCL 15 MG PO CAPS: 15.0000 mg | ORAL_CAPSULE | Freq: Every evening | ORAL | 2 refills | Status: DC | PRN

## 2017-12-06 MED ORDER — HYDROCORTISONE 2.5 % EX CREA: TOPICAL_CREAM | Freq: Two times a day (BID) | CUTANEOUS | 1 refills | Status: DC

## 2017-12-06 MED ORDER — CYANOCOBALAMIN 1000 MCG/ML IJ SOLN
1000.0000 ug | Freq: Once | INTRAMUSCULAR | Status: AC
  Administered 2017-12-06: 1000 ug via INTRAMUSCULAR

## 2017-12-06 NOTE — Progress Notes (Signed)
I have reviewed the documentation from the recent AWV done by Kim Broome; I agree with the documentation and will follow up on any recommendations or abnormal findings as suggested.  

## 2017-12-06 NOTE — Patient Instructions (Addendum)
Continue doing brain stimulating activities (puzzles, reading, adult coloring books, staying active) to keep memory sharp.   Health Maintenance, Female Adopting a healthy lifestyle and getting preventive care can go a long way to promote health and wellness. Talk with your health care provider about what schedule of regular examinations is right for you. This is a good chance for you to check in with your provider about disease prevention and staying healthy. In between checkups, there are plenty of things you can do on your own. Experts have done a lot of research about which lifestyle changes and preventive measures are most likely to keep you healthy. Ask your health care provider for more information. Weight and diet Eat a healthy diet  Be sure to include plenty of vegetables, fruits, low-fat dairy products, and lean protein.  Do not eat a lot of foods high in solid fats, added sugars, or salt.  Get regular exercise. This is one of the most important things you can do for your health. ? Most adults should exercise for at least 150 minutes each week. The exercise should increase your heart rate and make you sweat (moderate-intensity exercise). ? Most adults should also do strengthening exercises at least twice a week. This is in addition to the moderate-intensity exercise.  Maintain a healthy weight  Body mass index (BMI) is a measurement that can be used to identify possible weight problems. It estimates body fat based on height and weight. Your health care provider can help determine your BMI and help you achieve or maintain a healthy weight.  For females 67 years of age and older: ? A BMI below 18.5 is considered underweight. ? A BMI of 18.5 to 24.9 is normal. ? A BMI of 25 to 29.9 is considered overweight. ? A BMI of 30 and above is considered obese.  Watch levels of cholesterol and blood lipids  You should start having your blood tested for lipids and cholesterol at 82 years of  age, then have this test every 5 years.  You may need to have your cholesterol levels checked more often if: ? Your lipid or cholesterol levels are high. ? You are older than 82 years of age. ? You are at high risk for heart disease.  Cancer screening Lung Cancer  Lung cancer screening is recommended for adults 6-47 years old who are at high risk for lung cancer because of a history of smoking.  A yearly low-dose CT scan of the lungs is recommended for people who: ? Currently smoke. ? Have quit within the past 15 years. ? Have at least a 30-pack-year history of smoking. A pack year is smoking an average of one pack of cigarettes a day for 1 year.  Yearly screening should continue until it has been 15 years since you quit.  Yearly screening should stop if you develop a health problem that would prevent you from having lung cancer treatment.  Breast Cancer  Practice breast self-awareness. This means understanding how your breasts normally appear and feel.  It also means doing regular breast self-exams. Let your health care provider know about any changes, no matter how small.  If you are in your 20s or 30s, you should have a clinical breast exam (CBE) by a health care provider every 1-3 years as part of a regular health exam.  If you are 21 or older, have a CBE every year. Also consider having a breast X-ray (mammogram) every year.  If you have a family history of  of breast cancer, talk to your health care provider about genetic screening.  If you are at high risk for breast cancer, talk to your health care provider about having an MRI and a mammogram every year.  Breast cancer gene (BRCA) assessment is recommended for women who have family members with BRCA-related cancers. BRCA-related cancers include: ? Breast. ? Ovarian. ? Tubal. ? Peritoneal cancers.  Results of the assessment will determine the need for genetic counseling and BRCA1 and BRCA2 testing.  Cervical  Cancer Your health care provider may recommend that you be screened regularly for cancer of the pelvic organs (ovaries, uterus, and vagina). This screening involves a pelvic examination, including checking for microscopic changes to the surface of your cervix (Pap test). You may be encouraged to have this screening done every 3 years, beginning at age 21.  For women ages 30-65, health care providers may recommend pelvic exams and Pap testing every 3 years, or they may recommend the Pap and pelvic exam, combined with testing for human papilloma virus (HPV), every 5 years. Some types of HPV increase your risk of cervical cancer. Testing for HPV may also be done on women of any age with unclear Pap test results.  Other health care providers may not recommend any screening for nonpregnant women who are considered low risk for pelvic cancer and who do not have symptoms. Ask your health care provider if a screening pelvic exam is right for you.  If you have had past treatment for cervical cancer or a condition that could lead to cancer, you need Pap tests and screening for cancer for at least 20 years after your treatment. If Pap tests have been discontinued, your risk factors (such as having a new sexual partner) need to be reassessed to determine if screening should resume. Some women have medical problems that increase the chance of getting cervical cancer. In these cases, your health care provider may recommend more frequent screening and Pap tests.  Colorectal Cancer  This type of cancer can be detected and often prevented.  Routine colorectal cancer screening usually begins at 82 years of age and continues through 82 years of age.  Your health care provider may recommend screening at an earlier age if you have risk factors for colon cancer.  Your health care provider may also recommend using home test kits to check for hidden blood in the stool.  A small camera at the end of a tube can be used to  examine your colon directly (sigmoidoscopy or colonoscopy). This is done to check for the earliest forms of colorectal cancer.  Routine screening usually begins at age 50.  Direct examination of the colon should be repeated every 5-10 years through 82 years of age. However, you may need to be screened more often if early forms of precancerous polyps or small growths are found.  Skin Cancer  Check your skin from head to toe regularly.  Tell your health care provider about any new moles or changes in moles, especially if there is a change in a mole's shape or color.  Also tell your health care provider if you have a mole that is larger than the size of a pencil eraser.  Always use sunscreen. Apply sunscreen liberally and repeatedly throughout the day.  Protect yourself by wearing long sleeves, pants, a wide-brimmed hat, and sunglasses whenever you are outside.  Heart disease, diabetes, and high blood pressure  High blood pressure causes heart disease and increases the risk of stroke. High   blood pressure is more likely to develop in: ? People who have blood pressure in the high end of the normal range (130-139/85-89 mm Hg). ? People who are overweight or obese. ? People who are African American.  If you are 18-39 years of age, have your blood pressure checked every 3-5 years. If you are 40 years of age or older, have your blood pressure checked every year. You should have your blood pressure measured twice-once when you are at a hospital or clinic, and once when you are not at a hospital or clinic. Record the average of the two measurements. To check your blood pressure when you are not at a hospital or clinic, you can use: ? An automated blood pressure machine at a pharmacy. ? A home blood pressure monitor.  If you are between 55 years and 79 years old, ask your health care provider if you should take aspirin to prevent strokes.  Have regular diabetes screenings. This involves taking a  blood sample to check your fasting blood sugar level. ? If you are at a normal weight and have a low risk for diabetes, have this test once every three years after 82 years of age. ? If you are overweight and have a high risk for diabetes, consider being tested at a younger age or more often. Preventing infection Hepatitis B  If you have a higher risk for hepatitis B, you should be screened for this virus. You are considered at high risk for hepatitis B if: ? You were born in a country where hepatitis B is common. Ask your health care provider which countries are considered high risk. ? Your parents were born in a high-risk country, and you have not been immunized against hepatitis B (hepatitis B vaccine). ? You have HIV or AIDS. ? You use needles to inject street drugs. ? You live with someone who has hepatitis B. ? You have had sex with someone who has hepatitis B. ? You get hemodialysis treatment. ? You take certain medicines for conditions, including cancer, organ transplantation, and autoimmune conditions.  Hepatitis C  Blood testing is recommended for: ? Everyone born from 1945 through 1965. ? Anyone with known risk factors for hepatitis C.  Sexually transmitted infections (STIs)  You should be screened for sexually transmitted infections (STIs) including gonorrhea and chlamydia if: ? You are sexually active and are younger than 82 years of age. ? You are older than 82 years of age and your health care provider tells you that you are at risk for this type of infection. ? Your sexual activity has changed since you were last screened and you are at an increased risk for chlamydia or gonorrhea. Ask your health care provider if you are at risk.  If you do not have HIV, but are at risk, it may be recommended that you take a prescription medicine daily to prevent HIV infection. This is called pre-exposure prophylaxis (PrEP). You are considered at risk if: ? You are sexually active and  do not regularly use condoms or know the HIV status of your partner(s). ? You take drugs by injection. ? You are sexually active with a partner who has HIV.  Talk with your health care provider about whether you are at high risk of being infected with HIV. If you choose to begin PrEP, you should first be tested for HIV. You should then be tested every 3 months for as long as you are taking PrEP. Pregnancy  If you are   and you may become pregnant, ask your health care provider about preconception counseling.  If you may become pregnant, take 400 to 800 micrograms (mcg) of folic acid every day.  If you want to prevent pregnancy, talk to your health care provider about birth control (contraception). Osteoporosis and menopause  Osteoporosis is a disease in which the bones lose minerals and strength with aging. This can result in serious bone fractures. Your risk for osteoporosis can be identified using a bone density scan.  If you are 76 years of age or older, or if you are at risk for osteoporosis and fractures, ask your health care provider if you should be screened.  Ask your health care provider whether you should take a calcium or vitamin D supplement to lower your risk for osteoporosis.  Menopause may have certain physical symptoms and risks.  Hormone replacement therapy may reduce some of these symptoms and risks. Talk to your health care provider about whether hormone replacement therapy is right for you. Follow these instructions at home:  Schedule regular health, dental, and eye exams.  Stay current with your immunizations.  Do not use any tobacco products including cigarettes, chewing tobacco, or electronic cigarettes.  If you are pregnant, do not drink alcohol.  If you are breastfeeding, limit how much and how often you drink alcohol.  Limit alcohol intake to no more than 1 drink per day for nonpregnant women. One drink equals 12 ounces of beer, 5 ounces of  wine, or 1 ounces of hard liquor.  Do not use street drugs.  Do not share needles.  Ask your health care provider for help if you need support or information about quitting drugs.  Tell your health care provider if you often feel depressed.  Tell your health care provider if you have ever been abused or do not feel safe at home. This information is not intended to replace advice given to you by your health care provider. Make sure you discuss any questions you have with your health care provider. Document Released: 07/03/2010 Document Revised: 05/26/2015 Document Reviewed: 09/21/2014 Elsevier Interactive Patient Education  [employer].

## 2017-12-06 NOTE — Patient Instructions (Addendum)
Please return in 6 months for your annual complete physical; please come fasting.  Please schedule Vitamin B12 for every other month, next due end of February, nurse visit.   If you have any questions or concerns, please don't hesitate to send me a message via MyChart or call the office at [phone]. Thank you for visiting with [place] today! It's our pleasure caring for you.

## 2017-12-06 NOTE — Progress Notes (Signed)
Subjective  Chief Complaint  Patient presents with  . Annual Exam    HPI: Beth Wise is a 84 y.o. female who presents to [place] at [facility] today for a Female Wellness Visit. She also has the concerns and/or needs as listed above in the chief complaint. These will be addressed in addition to the Health Maintenance Visit.   Wellness Visit: annual visit with health maintenance review and exam.  She had annual wellness visit today as well  No concerns on annual wellness visit.  Health maintenance items are up-to-date Chronic disease f/u and/or acute problem visit: (deemed necessary to be done in addition to the wellness visit):  Vitamin B12 deficiency is due for vitamin B12 injection.  She should be getting them every month.  Will recheck levels today  Hypertension is well controlled.  She feels well without chest pain  Collagenous colitis: Improving and finally weaning down from steroids.  Primary insomnia maintained on chronic PRN benzos.  Request refills.  She understands risk versus benefits.  History of vitamin D deficiency: Requesting recheck.  She does have osteoporosis on Prolia.  Appetite and diet are healthy  Assessment  1. Annual physical exam   2. Benign essential hypertension   3. Collagenous colitis   4. Senile osteoporosis   5. Primary insomnia   6. Vitamin D deficiency   7. Vitamin B12 deficiency      Plan  Female Wellness Visit:  Age appropriate Health Maintenance and Prevention measures were discussed with patient. Included topics are cancer screening recommendations, ways to keep healthy (see AVS) including dietary and exercise recommendations, regular eye and dental care, use of seat belts, and avoidance of moderate alcohol use and tobacco use.   BMI: discussed patient's BMI and encouraged positive lifestyle modifications to help get to or maintain a target BMI.  HM needs and immunizations were addressed and ordered. See below for  orders. See HM and immunization section for updates.  Routine labs and screening tests ordered including cmp, cbc and lipids where appropriate.  Discussed recommendations regarding Vit D and calcium supplementation (see AVS)  Chronic disease management visit and/or acute problem visit:  Hypertension is well controlled  Continue Prolia for osteoporosis.  DEXA is up-to-date this year.  Mildly improved.  Check vitamin D levels while on Prolia.  Check calcium levels.    Recheck vitamin B12 levels and give injection today.  Insomnia on chronic benzos unlikely to wean.  Uses intermittently. Follow up: Return in about 6 months (around 06/07/2018) for follow up Hypertension.  Orders Placed This Encounter  Procedures  . Anemia Profile B  . VITAMIN D 25 Hydroxy (Vit-D Deficiency, Fractures)  . Lipid panel  . Comprehensive metabolic panel  . TSH   Meds ordered this encounter  Medications  . flurazepam (DALMANE) 15 MG capsule    Sig: Take 1 capsule (15 mg total) by mouth at bedtime as needed for sleep. 2-3 times a week.    Dispense:  30 capsule    Refill:  2  . hydrocortisone 2.5 % cream    Sig: Apply topically 2 (two) times daily.    Dispense:  30 g    Refill:  1  . cyanocobalamin ((VITAMIN B-12)) injection 1,000 mcg      Lifestyle: Body mass index is 22.38 kg/m. Wt Readings from Last 3 Encounters:  12/06/17 122 lb 6 oz (55.5 kg)  12/06/17 122 lb 6 oz (55.5 kg)  11/27/17 123 lb 2 oz (55.8 kg)  Diet: general    Patient Active Problem List   Diagnosis Date Noted  . Spinal stenosis of lumbar region without neurogenic claudication 11/26/2016    Priority: High  . Benign essential hypertension 11/26/2016    Priority: High  . Senile osteoporosis 11/26/2016    Priority: High    Dexa 09/2017: L 1/3 Radius:-3.00 R Femur Neck: -1.80 L Femur Neck: -2.20 R Total Femur: -1.50 L Total Femur:-1.90   . Chronic diastolic CHF (congestive heart failure), NYHA class 1 ([place]) 11/26/2016      Priority: High    per echo 2018   . GERD 04/03/2007    Priority: High    Qualifier: Diagnosis of  By: Julaine Hua CMA (AAMA), Estill Bamberg     . Vitamin B12 deficiency 09/23/2017    Priority: Medium  . Primary hyperparathyroidism ([place]) 03/11/2017    Priority: Medium  . Primary insomnia 11/26/2016    Priority: Medium  . Thoracogenic scoliosis of thoracolumbar region 11/26/2016    Priority: Medium  . Postmenopause atrophic vaginitis 11/26/2016    Priority: Medium  . Rectocele 08/30/2016    Priority: Medium  . Anemia of chronic disease 10/18/2010    Priority: Medium    Iron studies at PCP show TIBC 264 with sat 10% and ferritin 108    . Collagenous colitis 04/03/2007    Priority: Medium     Diagnosed 2008 colonoscopy treated with Lucrezia Starch and Immodium Diarrhea problems and gas-bloating in 2010 - started after antibiotics Prevacid discontinued late 2010 Recurrent problems 2010-2012 - did seem to respond to prednisone with maximal dose 20 mg but recurrent sxs Flex sigs 2010 and 2012 continue to confirm collagenous colitis Colonoscopy 2019 again confirms it    . Dry mouth 11/26/2016    Priority: Low  . Mitral regurgitation 02/22/2011    Priority: Low  . DNR (do not resuscitate) 12/06/2017  . Vitamin D deficiency 12/06/2017  . Cervicalgia 09/23/2017   Health Maintenance  Topic Date Due  . MAMMOGRAM  09/10/2018  . DEXA SCAN  09/10/2019  . TETANUS/TDAP  01/02/2020  . INFLUENZA VACCINE  Completed  . PNA vac Low Risk Adult  Completed   Immunization History  Administered Date(s) Administered  . Influenza, High Dose Seasonal PF 09/23/2017  . Influenza-Unspecified 09/01/2015, 10/22/2016  . Pneumococcal Conjugate-13 11/07/2016  . Pneumococcal-Unspecified 01/02/2015  . Td 01/01/2010  . Zoster Recombinat (Shingrix) 02/14/2017, 04/17/2017   We updated and reviewed the patient's past history in detail and it is documented below. Allergies: Patient is allergic to actonel  [risedronate sodium]; amoxicillin; erythromycin; lipitor [atorvastatin calcium]; and zetia [ezetimibe]. Past Medical History Patient  has a past medical history of Allergy, Arthritis, Back pain, Blepharitis, Cancer ([place]), Candidal esophagitis ([place]) (2010), Cataract, Chest pain, atypical, Collagenous colitis, Diverticulosis, Fuchs' corneal dystrophy, GERD (gastroesophageal reflux disease), Heart murmur, Hiatal hernia (2010), History of fibromyalgia, Hypercalcemia, Hyperlipidemia, Hypertension, IBS (irritable bowel syndrome), Insomnia, Osteoporosis, Primary hyperparathyroidism ([place]) (03/11/2017), Rectocele, Scoliosis, Shingles (1975), Spinal stenosis, and Vaginal prolapse. Past Surgical History Patient  has a past surgical history that includes Cataract extraction w/ intraocular lens  implant, bilateral; Upper gastrointestinal endoscopy (01/29/2008); Colonoscopy (04/24/2006); Abdominal hysterectomy; Breast biopsy (1964); Tonsillectomy and adenoidectomy; US ECHOCARDIOGRAPHY (10/30/2007); Cardiovascular stress test (12/03/2007); and Flexible sigmoidoscopy (07/27/2010). Family History: Patient family history includes Breast cancer in her mother; Cancer - Other in her sister; Cirrhosis in her sister; Heart disease in her brother, brother, father, and sister; Pneumonia in her sister; Stomach cancer in her sister; Stroke in her mother. Social History:  Patient  reports that she has never smoked. She has never used smokeless tobacco. She reports that she drinks alcohol. She reports that she does not use drugs.  Review of Systems: Constitutional: negative for fever or malaise Ophthalmic: negative for photophobia, double vision or loss of vision Cardiovascular: negative for chest pain, dyspnea on exertion, or new LE swelling Respiratory: negative for SOB or persistent cough Gastrointestinal: negative for abdominal pain, change in bowel habits or melena Genitourinary: negative for dysuria or gross hematuria, no  abnormal uterine bleeding or disharge Musculoskeletal: negative for new gait disturbance or muscular weakness Integumentary: negative for new or persistent rashes, no breast lumps Neurological: negative for TIA or stroke symptoms Psychiatric: negative for SI or delusions Allergic/Immunologic: negative for hives  Patient Care Team    Relationship Specialty Notifications Start End  Leamon Arnt, MD PCP - General Family Medicine  03/05/17   Rolm Bookbinder, MD Consulting Physician Dermatology  10/26/16   Marti Sleigh, MD Consulting Physician Gynecology  10/26/16   Lelon Perla, MD Consulting Physician Cardiology  10/26/16   Luberta Mutter, MD Consulting Physician Ophthalmology  10/26/16   Jola Baptist, [place] Referring Physician Chiropractic Medicine  10/26/16   Neomia Dear  Chiropractic Medicine  12/06/17   Starling Manns, MD  Orthopedic Surgery  12/06/17     Objective  Vitals: BP 120/64 (BP Location: Left Arm, Patient Position: Sitting, Cuff Size: Normal)   Pulse 96   Temp 97.7 F (36.5 C) (Temporal)   Resp 16   Ht 5\' 2"  (1.575 m)   Wt 122 lb 6 oz (55.5 kg)   SpO2 97%   BMI 22.38 kg/m  General:  Well developed, well nourished, no acute distress, appears younger than stated age Psych:  Alert and orientedx3,normal mood and affect HEENT:  Normocephalic, atraumatic, non-icteric sclera, PERRL, oropharynx is clear without mass or exudate, supple neck without adenopathy, mass or thyromegaly Cardiovascular:  Normal S1, S2, RRR without gallop, rub or murmur, nondisplaced PMI Respiratory:  Good breath sounds bilaterally, CTAB with normal respiratory effort Gastrointestinal: normal bowel sounds, soft, non-tender, no noted masses. No HSM MSK: no deformities, contusions. Joints are without erythema or swelling. Spine and CVA region are nontender Skin:  Warm, no rashes or suspicious lesions noted Neurologic:    Mental status is normal. CN 2-11 are normal. Gross motor and sensory  exams are normal. Normal gait. No tremor   Commons side effects, risks, benefits, and alternatives for medications and treatment plan prescribed today were discussed, and the patient expressed understanding of the given instructions. Patient is instructed to call or message via MyChart if he/she has any questions or concerns regarding our treatment plan. No barriers to understanding were identified. We discussed Red Flag symptoms and signs in detail. Patient expressed understanding regarding what to do in case of urgent or emergency type symptoms.   Medication list was reconciled, printed and provided to the patient in AVS. Patient instructions and summary information was reviewed with the patient as documented in the AVS. This note was prepared with assistance of Dragon voice recognition software. Occasional wrong-word or sound-a-like substitutions may have occurred due to the inherent limitations of voice recognition software

## 2017-12-07 LAB — ANEMIA PROFILE B
%SAT: 19 % (calc) (ref 16–45)
ABS Retic: 28710 cells/uL (ref 20000–8000)
Basophils Absolute: 31 cells/uL (ref 0–200)
Basophils Relative: 0.6 %
Eosinophils Absolute: 10 cells/uL — ABNORMAL LOW (ref 15–500)
Eosinophils Relative: 0.2 %
Ferritin: 608 ng/mL — ABNORMAL HIGH (ref 16–288)
Folate: 9.1 ng/mL
HCT: 29.4 % — ABNORMAL LOW (ref 35.0–45.0)
Hemoglobin: 9.8 g/dL — ABNORMAL LOW (ref 11.7–15.5)
Iron: 46 ug/dL (ref 45–160)
Lymphs Abs: 1253 cells/uL (ref 850–3900)
MCH: 30.7 pg (ref 27.0–33.0)
MCHC: 33.3 g/dL (ref 32.0–36.0)
MCV: 92.2 fL (ref 80.0–100.0)
MPV: 9.9 fL (ref 7.5–12.5)
Monocytes Relative: 9.4 %
Neutro Abs: 3416 cells/uL (ref 1500–7800)
Neutrophils Relative %: 65.7 %
Platelets: 243 10*3/uL (ref 140–400)
RBC: 3.19 10*6/uL — ABNORMAL LOW (ref 3.80–5.10)
RDW: 13.6 % (ref 11.0–15.0)
Retic Ct Pct: 0.9 %
TIBC: 239 mcg/dL (calc) — ABNORMAL LOW (ref 250–450)
Total Lymphocyte: 24.1 %
Vitamin B-12: >2000 pg/mL — ABNORMAL HIGH (ref 200–1100)
WBC mixed population: 489 cells/uL (ref 200–950)
WBC: 5.2 10*3/uL (ref 3.8–10.8)

## 2017-12-07 LAB — LIPID PANEL
Cholesterol: 189 mg/dL (ref <200)
HDL: 79 mg/dL (ref >50)
LDL Cholesterol (Calc): 89 mg/dL (calc)
Non-HDL Cholesterol (Calc): 110 mg/dL (calc) (ref <130)
Total CHOL/HDL Ratio: 2.4 (calc) (ref <5.0)
Triglycerides: 112 mg/dL (ref <150)

## 2017-12-07 LAB — COMPREHENSIVE METABOLIC PANEL
AG Ratio: 2 (calc) (ref 1.0–2.5)
ALT: 9 U/L (ref 6–29)
AST: 17 U/L (ref 10–35)
Albumin: 4.1 g/dL (ref 3.6–5.1)
Alkaline phosphatase (APISO): 30 U/L — ABNORMAL LOW (ref 33–130)
BUN/Creatinine Ratio: 24 (calc) — ABNORMAL HIGH (ref 6–22)
BUN: 22 mg/dL (ref 7–25)
CO2: 23 mmol/L (ref 20–32)
Calcium: 9.8 mg/dL (ref 8.6–10.4)
Chloride: 104 mmol/L (ref 98–110)
Creat: 0.92 mg/dL — ABNORMAL HIGH (ref 0.60–0.88)
Globulin: 2.1 g/dL (calc) (ref 1.9–3.7)
Glucose, Bld: 104 mg/dL — ABNORMAL HIGH (ref 65–99)
Potassium: 4.7 mmol/L (ref 3.5–5.3)
Sodium: 137 mmol/L (ref 135–146)
Total Bilirubin: 0.3 mg/dL (ref 0.2–1.2)
Total Protein: 6.2 g/dL (ref 6.1–8.1)

## 2017-12-07 LAB — VITAMIN D 25 HYDROXY (VIT D DEFICIENCY, FRACTURES): Vit D, 25-Hydroxy: 31 ng/mL (ref 30–100)

## 2017-12-07 LAB — TSH: TSH: 1.48 mIU/L (ref 0.40–4.50)

## 2017-12-18 DIAGNOSIS — M9901 Segmental and somatic dysfunction of cervical region: Secondary | ICD-10-CM | POA: Diagnosis not present

## 2017-12-18 DIAGNOSIS — M62830 Muscle spasm of back: Secondary | ICD-10-CM | POA: Diagnosis not present

## 2017-12-18 DIAGNOSIS — M546 Pain in thoracic spine: Secondary | ICD-10-CM | POA: Diagnosis not present

## 2017-12-18 DIAGNOSIS — M9902 Segmental and somatic dysfunction of thoracic region: Secondary | ICD-10-CM | POA: Diagnosis not present

## 2017-12-27 ENCOUNTER — Encounter: Payer: Self-pay | Admitting: Physician Assistant

## 2017-12-27 ENCOUNTER — Other Ambulatory Visit: Payer: Self-pay

## 2017-12-27 ENCOUNTER — Ambulatory Visit (INDEPENDENT_AMBULATORY_CARE_PROVIDER_SITE_OTHER): Payer: Medicare HMO | Admitting: Physician Assistant

## 2017-12-27 VITALS — BP 130/68 | HR 72 | Temp 98.3°F | Resp 14 | Ht 62.0 in | Wt 121.0 lb

## 2017-12-27 DIAGNOSIS — N9489 Other specified conditions associated with female genital organs and menstrual cycle: Secondary | ICD-10-CM

## 2017-12-27 DIAGNOSIS — N949 Unspecified condition associated with female genital organs and menstrual cycle: Secondary | ICD-10-CM

## 2017-12-27 LAB — POCT URINALYSIS DIPSTICK
Bilirubin, UA: NEGATIVE
Glucose, UA: NEGATIVE
Ketones, UA: NEGATIVE
Leukocytes, UA: NEGATIVE
Nitrite, UA: NEGATIVE
Protein, UA: NEGATIVE
Spec Grav, UA: <=1.005 — AB (ref 1.010–1.025)
Urobilinogen, UA: 0.2 E.U./dL
pH, UA: 5 (ref 5.0–8.0)

## 2017-12-27 NOTE — Patient Instructions (Signed)
No more castille soap! Clean the vaginal tissue with warm (not hot!) clean water and small amount of Dove soap! Pat dry. Get an over the counter vaginal moisturizer to apply as directed. Symptoms should continue to resolve.  Please follow-up with PCP for any ongoing issues.

## 2017-12-27 NOTE — Progress Notes (Signed)
Patient presents to clinic today c/o 1 week of vaginal irritation. Denies any urinary urgency, frequency or dysuria. Denies vaginal pruritus or discharge. Notes some burning sensation of the vagina. Notes she has been applying crisco to the area as a moisturizer over the past few days which has helped some. Is currently on vaginal estrogens, using prn currently. Notes she did start using Castille soap on the area a couple of days before symptom onset.  Past Medical History:  Diagnosis Date  . Allergy   . Arthritis   . Back pain   . Blepharitis   . Cancer ([place])    basal cell on face  . Candidal esophagitis ([place]) 2010  . Cataract   . Chest pain, atypical   . Collagenous colitis    Dr. Maurene Capes  . Diverticulosis   . Fuchs' corneal dystrophy   . GERD (gastroesophageal reflux disease)   . Heart murmur   . Hiatal hernia 2010  . History of fibromyalgia   . Hypercalcemia   . Hyperlipidemia   . Hypertension   . IBS (irritable bowel syndrome)   . Insomnia   . Osteoporosis   . Primary hyperparathyroidism ([place]) 03/11/2017  . Rectocele   . Scoliosis   . Shingles 1975   at waistline, no chronic pain.    Marland Kitchen Spinal stenosis   . Vaginal prolapse     Current Outpatient Medications on File Prior to Visit  Medication Sig Dispense Refill  . acetaminophen (TYLENOL) 500 MG tablet Take 500 mg by mouth every 6 (six) hours as needed.    Marland Kitchen BIOTIN PO Take 5,000 mcg by mouth daily.     . budesonide (ENTOCORT EC) 3 MG 24 hr capsule Take 3 mg by mouth every other day.    . Cholecalciferol (VITAMIN D) 2000 units CAPS Take by mouth.    . denosumab (PROLIA) 60 MG/ML SOSY injection Inject into the skin.    Marland Kitchen estradiol (ESTRACE) 0.1 MG/GM vaginal cream Place 1 Applicatorful vaginally at bedtime.    . famotidine (PEPCID) 40 MG tablet Take 1 tablet (40 mg total) by mouth 2 (two) times daily. 60 tablet 1  . flurazepam (DALMANE) 15 MG capsule Take 1 capsule (15 mg total) by mouth at bedtime as needed for sleep.  2-3 times a week. 30 capsule 2  . fluticasone (FLONASE) 50 MCG/ACT nasal spray Place 2 sprays into both nostrils daily. Sinus drainage 16 g 3  . GuaiFENesin (MUCINEX PO) Take 1 tablet by mouth every 12 (twelve) hours as needed (congestion).     . hydrocortisone 2.5 % cream Apply topically 2 (two) times daily. 30 g 1  . ipratropium (ATROVENT) 0.03 % nasal spray Place 1 spray into both nostrils 2 (two) times daily. As directed nasal spray 30 mL 1  . Melatonin 5 MG TABS Take by mouth. Take 1 tab 4-5 times a week    . tobramycin-dexamethasone (TOBRADEX) ophthalmic solution Place 1 drop into both eyes as needed.     No current facility-administered medications on file prior to visit.     Allergies  Allergen Reactions  . Actonel [Risedronate Sodium] Other (See Comments)    Joint pain  . Amoxicillin Diarrhea  . Erythromycin Other (See Comments)    Gi upset  . Lipitor [Atorvastatin Calcium]   . Zetia [Ezetimibe]     Joint pain    Family History  Problem Relation Age of Onset  . Breast cancer Mother        died at 70  .  Stroke Mother   . Heart disease Father        died at 43  . Cirrhosis Sister        died at 79  . Heart disease Brother        died at 47  . Pneumonia Sister        died at 75 months  . Heart disease Sister        died at 21  . Cancer - Other Sister        stomach; died at 46  . Stomach cancer Sister   . Heart disease Brother        c-diff and sepsis; died at 24  . Colon cancer Neg Hx   . Diabetes Neg Hx   . Rectal cancer Neg Hx   . Esophageal cancer Neg Hx     Social History   Socioeconomic History  . Marital status: Widowed    Spouse name: Not on file  . Number of children: 2  . Years of education: Not on file  . Highest education level: Not on file  Occupational History  . Occupation: retired    Comment: owned Human resources officer  Social Needs  . Financial resource strain: Not hard at all  . Food insecurity:    Worry: Never true    Inability: Never true   . Transportation needs:    Medical: No    Non-medical: No  Tobacco Use  . Smoking status: Never Smoker  . Smokeless tobacco: Never Used  Substance and Sexual Activity  . Alcohol use: Yes    Comment: rarely  . Drug use: No  . Sexual activity: Never  Lifestyle  . Physical activity:    Days per week: 7 days    Minutes per session: 10 min  . Stress: Only a little  Relationships  . Social connections:    Talks on phone: More than three times a week    Gets together: More than three times a week    Attends religious service: 1 to 4 times per year    Active member of club or organization: Yes    Attends meetings of clubs or organizations: Never    Relationship status: Widowed  Other Topics Concern  . Not on file  Social History Narrative   Social History      Diet? Regular (shouldn't have excessive amts of acid, hot &spicey foods)      Do you drink/eat things with caffeine? yes      Marital status?       widow                             What year were you married? [place] you live in a house, apartment, assisted living, condo, trailer, etc.? apartment      Is it one or more stories? 1      How many persons live in your home? 1 (self)      Do you have any pets in your home? (please list) no no !!      Highest level of education completed?      Current or past profession: hairdresser      Do you exercise?      little  Type & how often? Leg lifts and plank, stretching every day.      Advanced Directives      Do you have a living will?  yes      Do you have a DNR form?         yes                         If not, do you want to discuss one? yes      Do you have signed POA/HPOA for forms? yes      Functional Status      Do you have difficulty bathing or dressing yourself?      Do you have difficulty preparing food or eating?       Do you have difficulty managing your medications?      Do you have difficulty managing your  finances?      Do you have difficulty affording your medications?   Review of Systems - See HPI.  All other ROS are negative.  BP 130/68   Pulse 72   Temp 98.3 F (36.8 C) (Oral)   Resp 14   Ht 5\' 2"  (1.575 m)   Wt 121 lb (54.9 kg)   SpO2 98%   BMI 22.13 kg/m   Physical Exam Vitals signs reviewed. Exam conducted with a chaperone present.  Constitutional:      Appearance: Normal appearance.  HENT:     Head: Normocephalic and atraumatic.  Neck:     Musculoskeletal: Neck supple.  Cardiovascular:     Rate and Rhythm: Regular rhythm. Tachycardia present.  Genitourinary:    Exam position: Supine.     Pubic Area: No rash.      Comments: Erythema and mild irritation of vulva bilaterally without noted lesion or swelling. Vagina mucosa within normal limits. Chronic rectocele noted on examination.  Neurological:     Mental Status: She is alert.     Recent Results (from the past 2160 hour(s))  Anemia Profile B     Status: Abnormal   Collection Time: 12/06/17  3:16 PM  Result Value Ref Range   Ferritin 608 (H) 16 - 288 ng/mL   Folate 9.1 ng/mL    Comment:                            Reference Range                            Low:           <3.4                            Borderline:    3.4-5.4                            Normal:        >5.4 .    Retic Ct Pct 0.9 %   ABS Retic 28,710 20,000 - 8,000 cells/uL   Vitamin B-12 >2,000 (H) 200 - 1,100 pg/mL   WBC 5.2 3.8 - 10.8 Thousand/uL   RBC 3.19 (L) 3.80 - 5.10 Million/uL   Hemoglobin 9.8 (L) 11.7 - 15.5 g/dL   HCT 29.4 (L) 35.0 - 45.0 %   MCV 92.2 80.0 - 100.0 fL   MCH 30.7 27.0 - 33.0  pg   MCHC 33.3 32.0 - 36.0 g/dL   RDW 13.6 11.0 - 15.0 %   Platelets 243 140 - 400 Thousand/uL   MPV 9.9 7.5 - 12.5 fL   Neutro Abs 3,416 1,500 - 7,800 cells/uL   Lymphs Abs 1,253 850 - 3,900 cells/uL   WBC mixed population 489 200 - 950 cells/uL   Eosinophils Absolute 10 (L) 15 - 500 cells/uL   Basophils Absolute 31 0 - 200 cells/uL    Neutrophils Relative % 65.7 %   Total Lymphocyte 24.1 %   Monocytes Relative 9.4 %   Eosinophils Relative 0.2 %   Basophils Relative 0.6 %   Iron 46 45 - 160 mcg/dL   TIBC 239 (L) 250 - 450 mcg/dL (calc)   %SAT 19 16 - 45 % (calc)  VITAMIN D 25 Hydroxy (Vit-D Deficiency, Fractures)     Status: None   Collection Time: 12/06/17  3:16 PM  Result Value Ref Range   Vit D, 25-Hydroxy 31 30 - 100 ng/mL    Comment: Vitamin D Status         25-OH Vitamin D: . Deficiency:                    <20 ng/mL Insufficiency:             20 - 29 ng/mL Optimal:                 > or = 30 ng/mL . For 25-OH Vitamin D testing on patients on  D2-supplementation and patients for whom quantitation  of D2 and D3 fractions is required, the QuestAssureD(TM) 25-OH VIT D, (D2,D3), LC/MS/MS is recommended: order  code [phone] (patients >20yrs). . For more information on this test, go to: http://education.quest[facility].com/faq/FAQ163 (This link is being provided for  informational/educational purposes only.)   Lipid panel     Status: None   Collection Time: 12/06/17  3:16 PM  Result Value Ref Range   Cholesterol 189 <200 mg/dL   HDL 79 >50 mg/dL   Triglycerides 112 <150 mg/dL   LDL Cholesterol (Calc) 89 mg/dL (calc)    Comment: Reference range: <100 . Desirable range <100 mg/dL for primary prevention;   <70 mg/dL for patients with CHD or diabetic patients  with > or = 2 CHD risk factors. Marland Kitchen LDL-C is now calculated using the Marchello Rothgeb-Hopkins  calculation, which is a validated novel method providing  better accuracy than the Friedewald equation in the  estimation of LDL-C.  Cresenciano Genre et al. Annamaria Helling. 4627;035(00): 2061-2068  (http://education.[facility].com/faq/FAQ164)    Total CHOL/HDL Ratio 2.4 <5.0 (calc)   Non-HDL Cholesterol (Calc) 110 <130 mg/dL (calc)    Comment: For patients with diabetes plus 1 major ASCVD risk  factor, treating to a non-HDL-C goal of <100 mg/dL  (LDL-C of <70 mg/dL) is  considered a therapeutic  option.   Comprehensive metabolic panel     Status: Abnormal   Collection Time: 12/06/17  3:16 PM  Result Value Ref Range   Glucose, Bld 104 (H) 65 - 99 mg/dL    Comment: .            Fasting reference interval . For someone without known diabetes, a glucose value between 100 and 125 mg/dL is consistent with prediabetes and should be confirmed with a follow-up test. .    BUN 22 7 - 25 mg/dL   Creat 0.92 (H) 0.60 - 0.88 mg/dL    Comment: For patients >63 years of age,  the reference limit for Creatinine is approximately 13% higher for people identified as African-American. .    BUN/Creatinine Ratio 24 (H) 6 - 22 (calc)   Sodium 137 135 - 146 mmol/L   Potassium 4.7 3.5 - 5.3 mmol/L   Chloride 104 98 - 110 mmol/L   CO2 23 20 - 32 mmol/L   Calcium 9.8 8.6 - 10.4 mg/dL   Total Protein 6.2 6.1 - 8.1 g/dL   Albumin 4.1 3.6 - 5.1 g/dL   Globulin 2.1 1.9 - 3.7 g/dL (calc)   AG Ratio 2.0 1.0 - 2.5 (calc)   Total Bilirubin 0.3 0.2 - 1.2 mg/dL   Alkaline phosphatase (APISO) 30 (L) 33 - 130 U/L   AST 17 10 - 35 U/L   ALT 9 6 - 29 U/L  TSH     Status: None   Collection Time: 12/06/17  3:16 PM  Result Value Ref Range   TSH 1.48 0.40 - 4.50 mIU/L    Assessment/Plan: 1. Vaginal burning Urine dip negative for glucose or sign of infection. Chaperone present for exam. External structures with erythema and irritation. No lesions present. Examination of introitus and labia minora reveal vaginal atrophy. No irritation of vaginal walls. Chronic rectocele noted on examination. Suspect use of Castille soap to clean vaginal structures contributed to inflammation. Proper hygiene measures reviewed. Cetaphil moisturizer recommended for external skin. OTC vaginal moisturizers recommended for the vagina. Continue topical estrogens as directed by PCP. Follow-up if not continuing to improve/resolve. - POCT Urinalysis Dipstick   Leeanne Rio, PA-C

## 2017-12-30 ENCOUNTER — Telehealth: Payer: Self-pay | Admitting: Family Medicine

## 2017-12-30 NOTE — Telephone Encounter (Signed)
Copied from [phone]. Topic: Quick Communication - See Telephone Encounter >> Dec 30, 2017  9:13 AM Blase Mess A wrote: CRM for notification. See Telephone encounter for: 12/30/17.  Patient came in on 12/27/17 for vaginal itching. She saw Elyn Aquas. Patient become irratated to what Einar Pheasant gave her. She took benadryl she is wanting to know does she need to come in to see Dr. Jonni Sanger or can Dr. Jonni Sanger call her in some medication. Please advise [phone]

## 2017-12-30 NOTE — Telephone Encounter (Signed)
I recommend using her hydrocortisone nightly for the next 3 days. If not improving, office visit with me. Thanks

## 2017-12-30 NOTE — Telephone Encounter (Signed)
She was not given anything. An OTC vaginal moisturizer was recommended. Need to call patient to see what she has used and what symptoms she is currently having?

## 2017-12-30 NOTE — Telephone Encounter (Signed)
Patient states that she used Replense Vaginal cream and it caused burning and itching.  She took Benadryl and it relieved those symptoms.  Patient has been advised not to use the replense anymore,  but she now wants to know what she can use for her vaginal dryness/burning.  She asked that I refer her question to her PCP.  Routed to Dr. Jonni Sanger.

## 2018-01-02 NOTE — Telephone Encounter (Signed)
She may use nightly for 5-7 days, then can stop and use only as needed. thanks

## 2018-01-02 NOTE — Telephone Encounter (Signed)
Pt states that she has used the hydrocortisone cream for the last 3 days and it has helped her vaginal itching a lot. Pt states she also took benadryl as well. Pt would like to know if Dr. Jonni Sanger thinks she is ok to continue to use the cream for one more night? Please advise.

## 2018-01-06 DIAGNOSIS — M546 Pain in thoracic spine: Secondary | ICD-10-CM | POA: Diagnosis not present

## 2018-01-06 DIAGNOSIS — M9902 Segmental and somatic dysfunction of thoracic region: Secondary | ICD-10-CM | POA: Diagnosis not present

## 2018-01-06 DIAGNOSIS — M9901 Segmental and somatic dysfunction of cervical region: Secondary | ICD-10-CM | POA: Diagnosis not present

## 2018-01-06 DIAGNOSIS — M62830 Muscle spasm of back: Secondary | ICD-10-CM | POA: Diagnosis not present

## 2018-01-14 ENCOUNTER — Other Ambulatory Visit: Payer: Self-pay

## 2018-01-14 ENCOUNTER — Ambulatory Visit (INDEPENDENT_AMBULATORY_CARE_PROVIDER_SITE_OTHER): Payer: PPO

## 2018-01-14 ENCOUNTER — Other Ambulatory Visit: Payer: Self-pay | Admitting: *Deleted

## 2018-01-14 ENCOUNTER — Encounter: Payer: Self-pay | Admitting: Family Medicine

## 2018-01-14 ENCOUNTER — Ambulatory Visit (INDEPENDENT_AMBULATORY_CARE_PROVIDER_SITE_OTHER): Payer: PPO | Admitting: Family Medicine

## 2018-01-14 VITALS — BP 130/78 | HR 76 | Temp 98.6°F | Resp 14 | Ht 62.0 in | Wt 120.6 lb

## 2018-01-14 DIAGNOSIS — E538 Deficiency of other specified B group vitamins: Secondary | ICD-10-CM | POA: Diagnosis not present

## 2018-01-14 DIAGNOSIS — M48061 Spinal stenosis, lumbar region without neurogenic claudication: Secondary | ICD-10-CM | POA: Diagnosis not present

## 2018-01-14 DIAGNOSIS — M160 Bilateral primary osteoarthritis of hip: Secondary | ICD-10-CM | POA: Diagnosis not present

## 2018-01-14 DIAGNOSIS — M79605 Pain in left leg: Secondary | ICD-10-CM | POA: Diagnosis not present

## 2018-01-14 DIAGNOSIS — M810 Age-related osteoporosis without current pathological fracture: Secondary | ICD-10-CM

## 2018-01-14 DIAGNOSIS — M25652 Stiffness of left hip, not elsewhere classified: Secondary | ICD-10-CM

## 2018-01-14 NOTE — Patient Instructions (Addendum)
Please follow up as scheduled for your next visit with me: 06/06/2018   If you have any questions or concerns, please don't hesitate to send me a message via MyChart or call the office at [phone]. Thank you for visiting with [place] today! It's our pleasure caring for you.  Please go to our [facility] office to get your xrays done. You can walk in M-F between 8am and 5pm. Tell them you are there for xrays ordered by me. They will send me the results, then I will let you know the results with instructions.   Address: [place], [place], [place]  (office sits at [address] at [address] intersection; from here, turn left onto [place] [address]), take to [address], turn right and go for a mile or so, office will be on left across form [employer] )  We will call you with information regarding your referral appointment. Physical Therapy with Thamas Jaegers at [facility].  If you do not hear from [place] within the next 2 weeks, please let me know. It can take 1-2 weeks to get appointments set up with the specialists.

## 2018-01-14 NOTE — Progress Notes (Signed)
Subjective  CC:  Chief Complaint  Patient presents with  . Fatigue  . Leg Pain    Left leg     HPI: Beth Wise is a 85 y.o. female who presents to the office today to address the problems listed above in the chief complaint.  85 year old female presents for left leg pain and hip stiffness.  She reports she has an aching pain in the mid femur.  It is not tender to touch.  No rash.  No radicular shooting pains.  Not related to positions or walking or standing.  Improved with Tylenol which she has been taking fairly regularly over the last few days.  No injury.  She does have a chiropractor and an orthopedic surgeon who treated her for low back pain and spinal stenosis of the lumbar region.  She denies bowel or bladder incontinence.  She also reports that she cannot pull her left knee up to her chest like she cannot on the right.  She also has pain in the left leg when she is doing planks.  She would like physical therapy for pain and balance and strengthening.  Follow-up osteoporosis: Due for her next Prolia injection in March.  Chart reviewed.  Chronic disease anemia and vitamin B12 deficiency: Last injection given in December.  Levels were high at that time.  Recommend every 3 month injections. Assessment  1. Left leg pain   2. Spinal stenosis of lumbar region without neurogenic claudication   3. Hip stiffness, left   4. Vitamin B12 deficiency   5. Senile osteoporosis      Plan   Left leg pain and hip stiffness: Could be related to spinal stenosis and coming from the lumbar region, but will check hip x-rays and femur to be sure.  She does have decreased range of motion albeit mild on the left.  Continue Tylenol as needed.  Reassured.  Vitamin B12 deficiency: Recommend rechecking vitamin B12 level in 3 months, March.  If indicated will continue vitamin B12 injections every 3 months.  Osteoporosis: Next Prolia injection due in March as well.  Physical therapy ordered for left leg  pain and hip stiffness.  Also for balance and strengthening.  Follow up: Return in about 6 months (around 07/15/2018) for follow up Hypertension, recheck.  02/10/2018  Orders Placed This Encounter  Procedures  . DG HIPS BILAT W OR W/O PELVIS 2V  . DG FEMUR MIN 2 VIEWS LEFT  . Ambulatory referral to Physical Therapy   No orders of the defined types were placed in this encounter.     I reviewed the patients updated PMH, FH, and SocHx.    Patient Active Problem List   Diagnosis Date Noted  . Spinal stenosis of lumbar region without neurogenic claudication 11/26/2016    Priority: High  . Benign essential hypertension 11/26/2016    Priority: High  . Senile osteoporosis 11/26/2016    Priority: High  . Chronic diastolic CHF (congestive heart failure), NYHA class 1 ([place]) 11/26/2016    Priority: High  . GERD 04/03/2007    Priority: High  . Vitamin B12 deficiency 09/23/2017    Priority: Medium  . Primary hyperparathyroidism ([place]) 03/11/2017    Priority: Medium  . Primary insomnia 11/26/2016    Priority: Medium  . Thoracogenic scoliosis of thoracolumbar region 11/26/2016    Priority: Medium  . Postmenopause atrophic vaginitis 11/26/2016    Priority: Medium  . Rectocele 08/30/2016    Priority: Medium  . Anemia of chronic disease  10/18/2010    Priority: Medium  . Collagenous colitis 04/03/2007    Priority: Medium  . Dry mouth 11/26/2016    Priority: Low  . Mitral regurgitation 02/22/2011    Priority: Low  . DNR (do not resuscitate) 12/06/2017  . Vitamin D deficiency 12/06/2017  . Cervicalgia 09/23/2017   Current Meds  Medication Sig  . acetaminophen (TYLENOL) 500 MG tablet Take 500 mg by mouth every 6 (six) hours as needed.  Marland Kitchen BIOTIN PO Take 5,000 mcg by mouth daily.   . budesonide (ENTOCORT EC) 3 MG 24 hr capsule Take 3 mg by mouth every other day.  . Cholecalciferol (VITAMIN D) 2000 units CAPS Take by mouth.  . denosumab (PROLIA) 60 MG/ML SOSY injection Inject into the  skin.  Marland Kitchen estradiol (ESTRACE) 0.1 MG/GM vaginal cream Place 1 Applicatorful vaginally at bedtime.  . famotidine (PEPCID) 40 MG tablet Take 1 tablet (40 mg total) by mouth 2 (two) times daily.  . flurazepam (DALMANE) 15 MG capsule Take 1 capsule (15 mg total) by mouth at bedtime as needed for sleep. 2-3 times a week.  . fluticasone (FLONASE) 50 MCG/ACT nasal spray Place 2 sprays into both nostrils daily. Sinus drainage  . GuaiFENesin (MUCINEX PO) Take 1 tablet by mouth every 12 (twelve) hours as needed (congestion).   . hydrocortisone 2.5 % cream Apply topically 2 (two) times daily.  Marland Kitchen ipratropium (ATROVENT) 0.03 % nasal spray Place 1 spray into both nostrils 2 (two) times daily. As directed nasal spray  . Melatonin 5 MG TABS Take by mouth. Take 1 tab 4-5 times a week  . tobramycin-dexamethasone (TOBRADEX) ophthalmic solution Place 1 drop into both eyes as needed.    Allergies: Patient is allergic to actonel [risedronate sodium]; amoxicillin; erythromycin; lipitor [atorvastatin calcium]; and zetia [ezetimibe]. Family History: Patient family history includes Breast cancer in her mother; Cancer - Other in her sister; Cirrhosis in her sister; Heart disease in her brother, brother, father, and sister; Pneumonia in her sister; Stomach cancer in her sister; Stroke in her mother. Social History:  Patient  reports that she has never smoked. She has never used smokeless tobacco. She reports current alcohol use. She reports that she does not use drugs.  Review of Systems: Constitutional: Negative for fever malaise or anorexia Cardiovascular: negative for chest pain Respiratory: negative for SOB or persistent cough Gastrointestinal: negative for abdominal pain  Objective  Vitals: BP 130/78   Pulse 76   Temp 98.6 F (37 C) (Oral)   Resp 14   Ht 5\' 2"  (1.575 m)   Wt 120 lb 9.6 oz (54.7 kg)   SpO2 98%   BMI 22.06 kg/m  General: no acute distress , A&Ox3 HEENT: PEERL, conjunctiva normal,  Oropharynx moist,neck is supple Cardiovascular:  RRR without murmur or gallop.  Respiratory:  Good breath sounds bilaterally, CTAB with normal respiratory effort Skin:  Warm, no rashes Musculoskeletal: Right hip with full range of motion without pain.  Left hip with mildly decreased flexion.  Normal external rotation and internal rotation.  No lateral hip tenderness.  Thigh without tenderness or rash.  Normal gait.     Commons side effects, risks, benefits, and alternatives for medications and treatment plan prescribed today were discussed, and the patient expressed understanding of the given instructions. Patient is instructed to call or message via MyChart if he/she has any questions or concerns regarding our treatment plan. No barriers to understanding were identified. We discussed Red Flag symptoms and signs in detail. Patient expressed understanding  regarding what to do in case of urgent or emergency type symptoms.   Medication list was reconciled, printed and provided to the patient in AVS. Patient instructions and summary information was reviewed with the patient as documented in the AVS. This note was prepared with assistance of Dragon voice recognition software. Occasional wrong-word or sound-a-like substitutions may have occurred due to the inherent limitations of voice recognition software

## 2018-01-29 DIAGNOSIS — M25652 Stiffness of left hip, not elsewhere classified: Secondary | ICD-10-CM | POA: Diagnosis not present

## 2018-01-29 DIAGNOSIS — R278 Other lack of coordination: Secondary | ICD-10-CM | POA: Diagnosis not present

## 2018-01-29 DIAGNOSIS — M79605 Pain in left leg: Secondary | ICD-10-CM | POA: Diagnosis not present

## 2018-01-29 DIAGNOSIS — R293 Abnormal posture: Secondary | ICD-10-CM | POA: Diagnosis not present

## 2018-01-29 DIAGNOSIS — M48061 Spinal stenosis, lumbar region without neurogenic claudication: Secondary | ICD-10-CM | POA: Diagnosis not present

## 2018-01-29 DIAGNOSIS — M25552 Pain in left hip: Secondary | ICD-10-CM | POA: Diagnosis not present

## 2018-01-29 DIAGNOSIS — R2689 Other abnormalities of gait and mobility: Secondary | ICD-10-CM | POA: Diagnosis not present

## 2018-01-29 DIAGNOSIS — M545 Low back pain: Secondary | ICD-10-CM | POA: Diagnosis not present

## 2018-01-30 ENCOUNTER — Telehealth: Payer: Self-pay | Admitting: Family Medicine

## 2018-01-30 NOTE — Telephone Encounter (Signed)
Papers completed and faxed back.

## 2018-01-30 NOTE — Telephone Encounter (Signed)
Papers in Dr. Jonni Sanger folder to be signed

## 2018-01-30 NOTE — Telephone Encounter (Signed)
Received plan of care forms for pt. Placed in bin upfront w/charge sheet.

## 2018-01-31 DIAGNOSIS — M545 Low back pain: Secondary | ICD-10-CM | POA: Diagnosis not present

## 2018-01-31 DIAGNOSIS — M79605 Pain in left leg: Secondary | ICD-10-CM | POA: Diagnosis not present

## 2018-01-31 DIAGNOSIS — R2689 Other abnormalities of gait and mobility: Secondary | ICD-10-CM | POA: Diagnosis not present

## 2018-01-31 DIAGNOSIS — R293 Abnormal posture: Secondary | ICD-10-CM | POA: Diagnosis not present

## 2018-01-31 DIAGNOSIS — M25652 Stiffness of left hip, not elsewhere classified: Secondary | ICD-10-CM | POA: Diagnosis not present

## 2018-01-31 DIAGNOSIS — R278 Other lack of coordination: Secondary | ICD-10-CM | POA: Diagnosis not present

## 2018-01-31 DIAGNOSIS — M48061 Spinal stenosis, lumbar region without neurogenic claudication: Secondary | ICD-10-CM | POA: Diagnosis not present

## 2018-01-31 DIAGNOSIS — M25552 Pain in left hip: Secondary | ICD-10-CM | POA: Diagnosis not present

## 2018-02-03 ENCOUNTER — Other Ambulatory Visit: Payer: Self-pay

## 2018-02-03 MED ORDER — FAMOTIDINE 40 MG PO TABS: 40.0000 mg | ORAL_TABLET | Freq: Two times a day (BID) | ORAL | 5 refills | Status: DC

## 2018-02-03 NOTE — Telephone Encounter (Signed)
Famotidine refilled as pharmacy requested. Sarabelle is up to date on her office visits.

## 2018-02-04 DIAGNOSIS — M545 Low back pain: Secondary | ICD-10-CM | POA: Diagnosis not present

## 2018-02-04 DIAGNOSIS — M48061 Spinal stenosis, lumbar region without neurogenic claudication: Secondary | ICD-10-CM | POA: Diagnosis not present

## 2018-02-04 DIAGNOSIS — R278 Other lack of coordination: Secondary | ICD-10-CM | POA: Diagnosis not present

## 2018-02-04 DIAGNOSIS — R293 Abnormal posture: Secondary | ICD-10-CM | POA: Diagnosis not present

## 2018-02-04 DIAGNOSIS — R2689 Other abnormalities of gait and mobility: Secondary | ICD-10-CM | POA: Diagnosis not present

## 2018-02-04 DIAGNOSIS — M25552 Pain in left hip: Secondary | ICD-10-CM | POA: Diagnosis not present

## 2018-02-04 DIAGNOSIS — M25652 Stiffness of left hip, not elsewhere classified: Secondary | ICD-10-CM | POA: Diagnosis not present

## 2018-02-04 DIAGNOSIS — M79605 Pain in left leg: Secondary | ICD-10-CM | POA: Diagnosis not present

## 2018-02-06 ENCOUNTER — Telehealth: Payer: Self-pay

## 2018-02-06 DIAGNOSIS — M25552 Pain in left hip: Secondary | ICD-10-CM | POA: Diagnosis not present

## 2018-02-06 DIAGNOSIS — R278 Other lack of coordination: Secondary | ICD-10-CM | POA: Diagnosis not present

## 2018-02-06 DIAGNOSIS — M79605 Pain in left leg: Secondary | ICD-10-CM | POA: Diagnosis not present

## 2018-02-06 DIAGNOSIS — R2689 Other abnormalities of gait and mobility: Secondary | ICD-10-CM | POA: Diagnosis not present

## 2018-02-06 DIAGNOSIS — M25652 Stiffness of left hip, not elsewhere classified: Secondary | ICD-10-CM | POA: Diagnosis not present

## 2018-02-06 DIAGNOSIS — M545 Low back pain: Secondary | ICD-10-CM | POA: Diagnosis not present

## 2018-02-06 DIAGNOSIS — M48061 Spinal stenosis, lumbar region without neurogenic claudication: Secondary | ICD-10-CM | POA: Diagnosis not present

## 2018-02-06 DIAGNOSIS — R293 Abnormal posture: Secondary | ICD-10-CM | POA: Diagnosis not present

## 2018-02-06 NOTE — Telephone Encounter (Signed)
Copied from [place] [phone]. Topic: General - Other >> Feb 06, 2018  3:16 PM Percell Belt A wrote: Reason for CRM:   Pt called in and and is confused as to how often she was suppose to be getting the b12 injections.  She was told that she was getting them monthly but she said that she has not been.  She has on on 2/10 and wants to know if she really needs this injection?  She thought that her number were good this last time?    Please advise -[phone]

## 2018-02-06 NOTE — Telephone Encounter (Signed)
Pt is aware that we will check B12 level on Monday 02/10/18 and decide if she needs to continue injections at the moment or not.

## 2018-02-10 ENCOUNTER — Ambulatory Visit (INDEPENDENT_AMBULATORY_CARE_PROVIDER_SITE_OTHER): Payer: PPO

## 2018-02-10 DIAGNOSIS — E538 Deficiency of other specified B group vitamins: Secondary | ICD-10-CM | POA: Diagnosis not present

## 2018-02-10 LAB — VITAMIN B12: Vitamin B-12: 343 pg/mL (ref 211–911)

## 2018-02-11 ENCOUNTER — Telehealth: Payer: Self-pay | Admitting: Family Medicine

## 2018-02-11 DIAGNOSIS — M25552 Pain in left hip: Secondary | ICD-10-CM | POA: Diagnosis not present

## 2018-02-11 DIAGNOSIS — M545 Low back pain: Secondary | ICD-10-CM | POA: Diagnosis not present

## 2018-02-11 DIAGNOSIS — M48061 Spinal stenosis, lumbar region without neurogenic claudication: Secondary | ICD-10-CM | POA: Diagnosis not present

## 2018-02-11 DIAGNOSIS — R2689 Other abnormalities of gait and mobility: Secondary | ICD-10-CM | POA: Diagnosis not present

## 2018-02-11 DIAGNOSIS — M25652 Stiffness of left hip, not elsewhere classified: Secondary | ICD-10-CM | POA: Diagnosis not present

## 2018-02-11 DIAGNOSIS — R293 Abnormal posture: Secondary | ICD-10-CM | POA: Diagnosis not present

## 2018-02-11 DIAGNOSIS — R278 Other lack of coordination: Secondary | ICD-10-CM | POA: Diagnosis not present

## 2018-02-11 DIAGNOSIS — M79605 Pain in left leg: Secondary | ICD-10-CM | POA: Diagnosis not present

## 2018-02-11 NOTE — Telephone Encounter (Signed)
Pt aware of results 

## 2018-02-11 NOTE — Telephone Encounter (Signed)
Copied from [place]. Topic: Quick Communication - See Telephone Encounter >> Feb 11, 2018 10:47 AM Ahmed Prima L wrote: CRM for notification. See Telephone encounter for: 02/11/18. Pt is requesting her results from yesterday. Please advise.

## 2018-02-11 NOTE — Progress Notes (Signed)
Please call patient: I have reviewed his/her lab results. Vit B12 now low normal again. Will restart Vit B12 injections; please schedule for every other month. Thank you.

## 2018-02-17 ENCOUNTER — Ambulatory Visit (INDEPENDENT_AMBULATORY_CARE_PROVIDER_SITE_OTHER): Payer: PPO

## 2018-02-17 ENCOUNTER — Ambulatory Visit: Payer: Self-pay | Admitting: *Deleted

## 2018-02-17 DIAGNOSIS — E538 Deficiency of other specified B group vitamins: Secondary | ICD-10-CM | POA: Diagnosis not present

## 2018-02-17 MED ORDER — CYANOCOBALAMIN 1000 MCG/ML IJ SOLN
1000.0000 ug | Freq: Once | INTRAMUSCULAR | Status: AC
  Administered 2018-02-17: 1000 ug via INTRAMUSCULAR

## 2018-02-17 NOTE — Telephone Encounter (Signed)
Please advise 

## 2018-02-17 NOTE — Progress Notes (Signed)
Administered B12 injection IM left arm. Patient tolerated well.

## 2018-02-17 NOTE — Telephone Encounter (Signed)
Needs an evaluation in the office to be sure how best to treat.

## 2018-02-17 NOTE — Telephone Encounter (Signed)
Pt reports nasal drainage, greenish x 4-5 weeks. Also reports "Not a cough but I am spitting up greenish- yellow phlegm." Denies SOB. States "Ears feel clogged at times" denies sinus tenderness. States afebrile. Has been taking Mucinex; states "I take that all the time for my allergies." States helps for a while. Pt was in for B12 shot today and states she forgot to mention these symptoms. Requesting "Something called in for infection or to dry me up." Declined appt. Specifically requesting Z-Pack.  Reiterated need for appt.  Informed pt Dr. Jonni Sanger would not likely call in antibiotic without seeing her. Pt verbalizes understanding. Please advise:  [phone]  Reason for Disposition . [1] Nasal discharge AND [2] present > 10 days  Answer Assessment - Initial Assessment Questions 1. LOCATION: "Where does it hurt?"      Slightly both  ears 2. ONSET: "When did the sinus pain start?"  (e.g., hours, days)      4-5 weeks ago 3. SEVERITY: "How bad is the pain?"   (Scale 1-10; mild, moderate or severe)   - MILD (1-3): doesn't interfere with normal activities    - MODERATE (4-7): interferes with normal activities (e.g., work or school) or awakens from sleep   - SEVERE (8-10): excruciating pain and patient unable to do any normal activities        mild 4. RECURRENT SYMPTOM: "Have you ever had sinus problems before?" If so, ask: "When was the last time?" and "What happened that time?"      *No Answer* 5. NASAL CONGESTION: "Is the nose blocked?" If so, ask, "Can you open it or must you breathe through the mouth?"     At times 6. NASAL DISCHARGE: "Do you have discharge from your nose?" If so ask, "What color?"     greenish 7. FEVER: "Do you have a fever?" If so, ask: "What is it, how was it measured, and when did it start?"      no 8. OTHER SYMPTOMS: "Do you have any other symptoms?" (e.g., sore throat, cough, earache, difficulty breathing)     Mild sore throat at times  Protocols used: SINUS PAIN OR  CONGESTION-A-AH

## 2018-02-18 ENCOUNTER — Encounter: Payer: Self-pay | Admitting: Family Medicine

## 2018-02-18 ENCOUNTER — Ambulatory Visit (INDEPENDENT_AMBULATORY_CARE_PROVIDER_SITE_OTHER): Payer: PPO | Admitting: Family Medicine

## 2018-02-18 VITALS — BP 116/70 | HR 74 | Temp 98.0°F | Resp 16 | Ht 62.0 in | Wt 122.4 lb

## 2018-02-18 DIAGNOSIS — R682 Dry mouth, unspecified: Secondary | ICD-10-CM

## 2018-02-18 DIAGNOSIS — J301 Allergic rhinitis due to pollen: Secondary | ICD-10-CM

## 2018-02-18 DIAGNOSIS — E538 Deficiency of other specified B group vitamins: Secondary | ICD-10-CM | POA: Diagnosis not present

## 2018-02-18 DIAGNOSIS — K529 Noninfective gastroenteritis and colitis, unspecified: Secondary | ICD-10-CM

## 2018-02-18 NOTE — Patient Instructions (Addendum)
Start allegra once daily and keep taking the mucinex twice a day as needed.   Use Vaseline on your teeth to help the soreness in your mouth.

## 2018-02-18 NOTE — Progress Notes (Signed)
Subjective  CC:  Chief Complaint  Patient presents with  . Sinusitis    has greenish-yellow mucous    HPI: Beth Wise is a 85 y.o. female who presents to the office today to address the problems listed above in the chief complaint.  85 yo with 3 week h/o nasal congestion and PND with repetitive sneezing. Denies fevers, sinus pain, ear pain, ST, cough or sob. Has fatigue but this is unchanged over the last several months with negative workup; chronic colitis on prednisone. Has chronic allergies on nasal sprays. No dental pain  C/o dry mouth and sore gums. No bleeding or swelling. Uses biotin rinse  Colitis: stable.   Had vit B12 injection yesterday for vit B12 deficiency; scheduled for qomonth injections.    Assessment  1. Seasonal allergic rhinitis due to pollen   2. Dry mouth   3. Vitamin B12 deficiency   4. Colitis      Plan   History most c/w SAR:  Reassured. Add allegra back and continue nasal sprays. Call for fever, sinus pain or dental pain.   Supportive care for dry mouth; multifactorial  Discussed vit b12 deficiency and associated sxs. Not anemic.   Colitis; reassured. Continue meds per GI.  Follow up: Return if symptoms worsen or fail to improve.  03/17/2018  No orders of the defined types were placed in this encounter.  No orders of the defined types were placed in this encounter.     I reviewed the patients updated PMH, FH, and SocHx.    Patient Active Problem List   Diagnosis Date Noted  . Spinal stenosis of lumbar region without neurogenic claudication 11/26/2016    Priority: High  . Benign essential hypertension 11/26/2016    Priority: High  . Senile osteoporosis 11/26/2016    Priority: High  . Chronic diastolic CHF (congestive heart failure), NYHA class 1 ([place]) 11/26/2016    Priority: High  . GERD 04/03/2007    Priority: High  . Vitamin B12 deficiency 09/23/2017    Priority: Medium  . Primary hyperparathyroidism ([place]) 03/11/2017   Priority: Medium  . Primary insomnia 11/26/2016    Priority: Medium  . Thoracogenic scoliosis of thoracolumbar region 11/26/2016    Priority: Medium  . Postmenopause atrophic vaginitis 11/26/2016    Priority: Medium  . Rectocele 08/30/2016    Priority: Medium  . Anemia of chronic disease 10/18/2010    Priority: Medium  . Collagenous colitis 04/03/2007    Priority: Medium  . Dry mouth 11/26/2016    Priority: Low  . Mitral regurgitation 02/22/2011    Priority: Low  . DNR (do not resuscitate) 12/06/2017  . Vitamin D deficiency 12/06/2017  . Cervicalgia 09/23/2017   Current Meds  Medication Sig  . acetaminophen (TYLENOL) 500 MG tablet Take 500 mg by mouth every 6 (six) hours as needed.  Marland Kitchen BIOTIN PO Take 5,000 mcg by mouth daily.   . budesonide (ENTOCORT EC) 3 MG 24 hr capsule Take 3 mg by mouth every other day.  . Cholecalciferol (VITAMIN D) 2000 units CAPS Take by mouth.  . denosumab (PROLIA) 60 MG/ML SOSY injection Inject into the skin.  Marland Kitchen estradiol (ESTRACE) 0.1 MG/GM vaginal cream Place 1 Applicatorful vaginally at bedtime.  . famotidine (PEPCID) 40 MG tablet Take 1 tablet (40 mg total) by mouth 2 (two) times daily.  . flurazepam (DALMANE) 15 MG capsule Take 1 capsule (15 mg total) by mouth at bedtime as needed for sleep. 2-3 times a week.  Marland Kitchen  fluticasone (FLONASE) 50 MCG/ACT nasal spray Place 2 sprays into both nostrils daily. Sinus drainage  . GuaiFENesin (MUCINEX PO) Take 1 tablet by mouth every 12 (twelve) hours as needed (congestion).   . hydrocortisone 2.5 % cream Apply topically 2 (two) times daily.  Marland Kitchen ipratropium (ATROVENT) 0.03 % nasal spray Place 1 spray into both nostrils 2 (two) times daily. As directed nasal spray  . Melatonin 5 MG TABS Take by mouth. Take 1 tab 4-5 times a week  . tobramycin-dexamethasone (TOBRADEX) ophthalmic solution Place 1 drop into both eyes as needed.    Allergies: Patient is allergic to actonel [risedronate sodium]; amoxicillin;  erythromycin; lipitor [atorvastatin calcium]; and zetia [ezetimibe]. Family History: Patient family history includes Breast cancer in her mother; Cancer - Other in her sister; Cirrhosis in her sister; Heart disease in her brother, brother, father, and sister; Pneumonia in her sister; Stomach cancer in her sister; Stroke in her mother. Social History:  Patient  reports that she has never smoked. She has never used smokeless tobacco. She reports current alcohol use. She reports that she does not use drugs.  Review of Systems: Constitutional: Negative for fever malaise or anorexia Cardiovascular: negative for chest pain Respiratory: negative for SOB or persistent cough Gastrointestinal: negative for abdominal pain  Objective  Vitals: BP 116/70   Pulse 74   Temp 98 F (36.7 C) (Oral)   Resp 16   Ht 5\' 2"  (1.575 m)   Wt 122 lb 6.4 oz (55.5 kg)   SpO2 97%   BMI 22.39 kg/m  General: no acute distress , A&Ox3 HEENT: PEERL, conjunctiva normal, Oropharynx dry with mild gingival recession, no sores, no sinus ttp, nasal mucosa is pale with clear discharge, no cervical LAD,neck is supple Cardiovascular:  RRR Respiratory:  Good breath sounds bilaterally, CTAB with normal respiratory effort Skin:  Warm, no rashes     Commons side effects, risks, benefits, and alternatives for medications and treatment plan prescribed today were discussed, and the patient expressed understanding of the given instructions. Patient is instructed to call or message via MyChart if he/she has any questions or concerns regarding our treatment plan. No barriers to understanding were identified. We discussed Red Flag symptoms and signs in detail. Patient expressed understanding regarding what to do in case of urgent or emergency type symptoms.   Medication list was reconciled, printed and provided to the patient in AVS. Patient instructions and summary information was reviewed with the patient as documented in the  AVS. This note was prepared with assistance of Dragon voice recognition software. Occasional wrong-word or sound-a-like substitutions may have occurred due to the inherent limitations of voice recognition software

## 2018-02-18 NOTE — Telephone Encounter (Signed)
Spoke with pt yesterday, she has been scheduled for 2/20

## 2018-02-20 ENCOUNTER — Ambulatory Visit: Payer: PPO | Admitting: Family Medicine

## 2018-02-20 DIAGNOSIS — M25552 Pain in left hip: Secondary | ICD-10-CM | POA: Diagnosis not present

## 2018-02-20 DIAGNOSIS — R278 Other lack of coordination: Secondary | ICD-10-CM | POA: Diagnosis not present

## 2018-02-20 DIAGNOSIS — M79605 Pain in left leg: Secondary | ICD-10-CM | POA: Diagnosis not present

## 2018-02-20 DIAGNOSIS — M48061 Spinal stenosis, lumbar region without neurogenic claudication: Secondary | ICD-10-CM | POA: Diagnosis not present

## 2018-02-20 DIAGNOSIS — M545 Low back pain: Secondary | ICD-10-CM | POA: Diagnosis not present

## 2018-02-20 DIAGNOSIS — R2689 Other abnormalities of gait and mobility: Secondary | ICD-10-CM | POA: Diagnosis not present

## 2018-02-20 DIAGNOSIS — M25652 Stiffness of left hip, not elsewhere classified: Secondary | ICD-10-CM | POA: Diagnosis not present

## 2018-02-20 DIAGNOSIS — R293 Abnormal posture: Secondary | ICD-10-CM | POA: Diagnosis not present

## 2018-02-21 ENCOUNTER — Ambulatory Visit: Payer: Self-pay

## 2018-02-21 NOTE — Telephone Encounter (Signed)
Noted  

## 2018-02-21 NOTE — Telephone Encounter (Signed)
  Pt c/o sore throat with white coating on tonsils and back of throat. No fever, mild pain. Pt also has sinus drainage and congestion with green secretions. Pt was seen in office 02/18/18 and dx with seasonal allergic rhinitis. Pt was advised to take allegra daily and Mucinex and nasal sprays.Pt given care advice and pt verbalized understanding. Pt refused appointment. Pt stated that she is not running fever and doesn't think she has an infection. Advised pt that if there is pus on tonsils and green nasal secretions, she needs to be evaluated in case of strep throat or bacterial sinusitis per protocol.  Pt stated [facility] [place] and she will go to the nurse for evaluation and if the nurse thinks she needs to make an appointment  Or if she worsens or develops a fever, pt advised to make appt or go to [facility].  Reason for Disposition . [1] Pus on tonsils (back of throat) AND [2]  fever AND [3] swollen neck lymph nodes ("glands")  Answer Assessment - Initial Assessment Questions 1. ONSET: "When did the throat start hurting?" (Hours or days ago)      Late yesterday 2. SEVERITY: "How bad is the sore throat?" (Scale 1-10; mild, moderate or severe)   - MILD (1-3):  doesn't interfere with eating or normal activities   - MODERATE (4-7): interferes with eating some solids and normal activities   - SEVERE (8-10):  excruciating pain, interferes with most normal activities   - SEVERE DYSPHAGIA: can't swallow liquids, drooling     mild 3. STREP EXPOSURE: "Has there been any exposure to strep within the past week?" If so, ask: "What type of contact occurred?"      no 4.  VIRAL SYMPTOMS: "Are there any symptoms of a cold, such as a runny nose, cough, hoarse voice or red eyes?"      Sinus congestion runny nose with green secretion congested sinuses, right ear hearing is muffled 5. FEVER: "Do you have a fever?" If so, ask: "What is your temperature, how was it measured, and when did it  start?"     no 6. PUS ON THE TONSILS: "Is there pus on the tonsils in the back of your throat?"     yes 7. OTHER SYMPTOMS: "Do you have any other symptoms?" (e.g., difficulty breathing, headache, rash)     no 8. PREGNANCY: "Is there any chance you are pregnant?" "When was your last menstrual period?"     n/a  Protocols used: SORE THROAT-A-AH

## 2018-02-24 NOTE — Progress Notes (Signed)
Reviewed and agree. F/u every other month for b12 injections.

## 2018-02-25 ENCOUNTER — Ambulatory Visit: Payer: Self-pay

## 2018-02-25 NOTE — Telephone Encounter (Signed)
Ret'd call to pt.  Reported she has had increased pain in left hip and leg.  Rated the pain at 10/10.  Denied any swelling, warmth, or redness in left hip/ leg.  Continues to take PT for her back and legs.  Also, c/o pain in shoulders and back. She stated the pain is interfering with her sleep, and in having the energy to do her usual activities.  Takes Tylenol 1000 mg. about every 6 hrs., for pain.  Feels that the Tylenol helps, but is asking if there is anything stronger to manage the pain.   Does not want to schedule an appt. at this time.  Stated she doesn't really like to take  medication.  Stated she took Tramadol at one time, and felt it affected her brain; she did not like how it affected her.  Advised will send triage note to Dr. Jonni Sanger and request further recommendations.    Reason for Disposition . [1] MODERATE pain (e.g., interferes with normal activities, limping) AND [2] present > 3 days  Answer Assessment - Initial Assessment Questions 1. ONSET: "When did the pain start?"      Has been ongoing but is worse in past several days 2. LOCATION: "Where is the pain located?"      Left hip and leg 3. PAIN: "How bad is the pain?"    (Scale 1-10; or mild, moderate, severe)   -  MILD (1-3): doesn't interfere with normal activities    -  MODERATE (4-7): interferes with normal activities (e.g., work or school) or awakens from sleep, limping    -  SEVERE (8-10): excruciating pain, unable to do any normal activities, unable to walk     States pain is at 10/10 at times; takes Tylenol 1000 mg about every 6 hrs. 4. WORK OR EXERCISE: "Has there been any recent work or exercise that involved this part of the body?"      Is taking PT for her leg and back pain  5. CAUSE: "What do you think is causing the leg pain?"     Known arthritis 6. OTHER SYMPTOMS: "Do you have any other symptoms?" (e.g., chest pain, back pain, breathing difficulty, swelling, rash, fever, numbness, weakness)     Feels pain in back  shoulders left hip and leg 7. PREGNANCY: "Is there any chance you are pregnant?" "When was your last menstrual period?"    N/a  Protocols used: LEG PAIN-A-AH Message from Bea Graff, NT sent at 02/25/2018 3:18 PM EST   Summary: left leg, hip and low back pain   Pt states she was seen a few weeks ago for left leg, hip and low back pain and was dx with arthritis. Pt states the last few days the pain has been worse and would like to see if something stronger for pain other than tylenol can can be prescribed.

## 2018-02-26 NOTE — Telephone Encounter (Signed)
Pt is aware is verbalized understanding. She reports at this time does not want to give up PT.

## 2018-02-26 NOTE — Telephone Encounter (Signed)
Please advise 

## 2018-02-26 NOTE — Telephone Encounter (Signed)
Nothing stronger recommended at this time.  I recommend taking a break from PT if she thinks this is causing or worsening her soreness.

## 2018-02-27 DIAGNOSIS — M48061 Spinal stenosis, lumbar region without neurogenic claudication: Secondary | ICD-10-CM | POA: Diagnosis not present

## 2018-02-27 DIAGNOSIS — M545 Low back pain: Secondary | ICD-10-CM | POA: Diagnosis not present

## 2018-02-27 DIAGNOSIS — R2689 Other abnormalities of gait and mobility: Secondary | ICD-10-CM | POA: Diagnosis not present

## 2018-02-27 DIAGNOSIS — M25552 Pain in left hip: Secondary | ICD-10-CM | POA: Diagnosis not present

## 2018-02-27 DIAGNOSIS — M79605 Pain in left leg: Secondary | ICD-10-CM | POA: Diagnosis not present

## 2018-02-27 DIAGNOSIS — M25652 Stiffness of left hip, not elsewhere classified: Secondary | ICD-10-CM | POA: Diagnosis not present

## 2018-02-27 DIAGNOSIS — R278 Other lack of coordination: Secondary | ICD-10-CM | POA: Diagnosis not present

## 2018-02-27 DIAGNOSIS — R293 Abnormal posture: Secondary | ICD-10-CM | POA: Diagnosis not present

## 2018-03-12 ENCOUNTER — Non-Acute Institutional Stay: Payer: PPO | Admitting: Internal Medicine

## 2018-03-12 ENCOUNTER — Encounter: Payer: Self-pay | Admitting: Internal Medicine

## 2018-03-12 VITALS — BP 132/70 | HR 75 | Temp 98.3°F | Ht 62.0 in | Wt 123.0 lb

## 2018-03-12 DIAGNOSIS — M4135 Thoracogenic scoliosis, thoracolumbar region: Secondary | ICD-10-CM

## 2018-03-12 DIAGNOSIS — I10 Essential (primary) hypertension: Secondary | ICD-10-CM | POA: Diagnosis not present

## 2018-03-12 DIAGNOSIS — M159 Polyosteoarthritis, unspecified: Secondary | ICD-10-CM | POA: Diagnosis not present

## 2018-03-12 DIAGNOSIS — M810 Age-related osteoporosis without current pathological fracture: Secondary | ICD-10-CM | POA: Diagnosis not present

## 2018-03-12 DIAGNOSIS — E210 Primary hyperparathyroidism: Secondary | ICD-10-CM | POA: Diagnosis not present

## 2018-03-12 DIAGNOSIS — K52831 Collagenous colitis: Secondary | ICD-10-CM

## 2018-03-12 DIAGNOSIS — M48061 Spinal stenosis, lumbar region without neurogenic claudication: Secondary | ICD-10-CM

## 2018-03-12 DIAGNOSIS — L988 Other specified disorders of the skin and subcutaneous tissue: Secondary | ICD-10-CM | POA: Diagnosis not present

## 2018-03-12 DIAGNOSIS — E538 Deficiency of other specified B group vitamins: Secondary | ICD-10-CM | POA: Diagnosis not present

## 2018-03-12 NOTE — Progress Notes (Signed)
Provider:  Rexene Edison. Mariea Clonts, D.O., C.M.D. Location:  Occupational psychologist of Service:  Clinic (12)  Previous PCP: Leamon Arnt, MD Patient Care Team: Leamon Arnt, MD as PCP - General (Family Medicine) Rolm Bookbinder, MD as Consulting Physician (Dermatology) Marti Sleigh, MD as Consulting Physician (Gynecology) Stanford Breed, Denice Bors, MD as Consulting Physician (Cardiology) Luberta Mutter, MD as Consulting Physician (Ophthalmology) Jola Baptist, Bearden as Referring Physician (Chiropractic Medicine) Neomia Dear (Chiropractic Medicine) Starling Manns, MD (Orthopedic Surgery) Gatha Mayer, MD as Consulting Physician (Gastroenterology)  Extended Emergency Contact Information Primary Emergency Contact: Turlington,Lanelle Address: [address]          [place], [place] [address] Johnnette Litter of [place] Phone: [phone] Relation: Daughter Secondary Emergency Contact: Turlington,Ed  Johnnette Litter of [place] Phone: [phone] Mobile Phone: [phone] Relation: Son  Goals of Care: Advanced Directive information Advanced Directives 03/12/2018  Does Patient Have a Medical Advance Directive? Yes  Type of Paramedic of [place];Living will;Out of facility DNR (pink MOST or yellow form)  Does patient want to make changes to medical advance directive? No - Patient declined  Copy of [place] in Chart? Yes - validated most recent copy scanned in chart (See row information)  Pre-existing out of facility DNR order (yellow form or pink MOST form) Yellow form placed in chart (order not valid for inpatient use);Pink MOST form placed in chart (order not valid for inpatient use)      Chief Complaint  Patient presents with  . Medical Management of Chronic Issues    re-establish care    HPI: Patient is a 85 y.o. female seen today to establish with [facility].  Records have been requested from  Dr. Jonni Sanger.  Pt is reestablishing here at [facility] because it's easier to get over here than taking transportation across town.    She is having severe arthritis all through her body.  Thinks she's typically taken enough tylenol by noon to feel better.  Its been bothersome since around Christmas and for sure around the first of the year.  Her left thigh was hurting overnight.  Dr. Jonni Sanger had sent her for xrays and lower back, both hips, both knees, legs.  Shoulders hurt, too.  Has scoliosis and stenosis also.  Didn't really get bothered until lately.  Using tylenol 548m--will wake up and take a couple, then take a couple more when she actually gets up.  This am had 10063mat 6am and then after 9am, two more.   Getting better results with two at a time.  She will take another two before lunch and supper.  Most to take should be 6 per day.    She's bought some supplements for her pain also.  She got glucosamine-chondroitin and cherry juice capsules.  Too early to tell if they're helping her.  She is now taking a small capsule of cbd oil with her lunch (with main meal with most fat).  Has natural alternatives brand.  Has a topical as well that she uses, as well.  Says she may be past water aerobics and exercises.  She did work with PT and is still doing those exercises.  Does miss the pool, but she gave herself permission to accept what she can and cannot do.  She was depressed at first.   She was taking aleve at first when she had colitis.  There were concerns that it caused her colitis.  We discussed the other risks of nsaids and also opioids.  She asked about tramadol.  She had some and deposited them at home b/c she was afraid at first.  May have tried them if she'd had them recently.    Also using MVI now.    She also had collagenous colitis since we met last time.  Lost weight and does not want to lose more.  Felt cured a time or two and has not been able to get off the budesonide.  Dr. Carlean Purl suggested  steroids every other day to keep things under control.  May take two at times.  Previously was cured by prednisone before by her report.  Discussed risks though of ongoing therapy.    Is getting prolia for her bone density.  Taking vitamin D.    She has not had a B12 injection b/w sept and feb.  She had appts for monthly B12 injections.    Gets really fatigued w/o them.  Last level 343. April 16th b12  Had AWV in Dec.    Also due for prolia 3/16  Using a walker now at times.  Past Medical History:  Diagnosis Date  . Allergy   . Arthritis   . Back pain   . Blepharitis   . Cancer ([place])    basal cell on face  . Candidal esophagitis ([place]) 2010  . Cataract   . Chest pain, atypical   . Collagenous colitis    Dr. Maurene Capes  . Diverticulosis   . Fuchs' corneal dystrophy   . GERD (gastroesophageal reflux disease)   . Heart murmur   . Hiatal hernia 2010  . History of fibromyalgia   . Hypercalcemia   . Hyperlipidemia   . Hypertension   . IBS (irritable bowel syndrome)   . Insomnia   . Osteoporosis   . Primary hyperparathyroidism ([place]) 03/11/2017  . Rectocele   . Scoliosis   . Shingles 1975   at waistline, no chronic pain.    Marland Kitchen Spinal stenosis   . Vaginal prolapse    Past Surgical History:  Procedure Laterality Date  . ABDOMINAL HYSTERECTOMY    . BREAST BIOPSY  1964   left benign  . CARDIOVASCULAR STRESS TEST  12/03/2007   EF 72%  . CATARACT EXTRACTION W/ INTRAOCULAR LENS  IMPLANT, BILATERAL    . COLONOSCOPY  04/24/2006   diverticulosis  . FLEXIBLE SIGMOIDOSCOPY  07/27/2010   diverticulosis, collagenous colitis  . TONSILLECTOMY AND ADENOIDECTOMY    . UPPER GASTROINTESTINAL ENDOSCOPY  01/29/2008   esophagitis, hiatal hernia, gastritis  . US ECHOCARDIOGRAPHY  10/30/2007    EF 55-60%    Social History   Socioeconomic History  . Marital status: Widowed    Spouse name: Not on file  . Number of children: 2  . Years of education: Not on file  . Highest education level: Not  on file  Occupational History  . Occupation: retired    Comment: owned Human resources officer  Social Needs  . Financial resource strain: Not hard at all  . Food insecurity:    Worry: Never true    Inability: Never true  . Transportation needs:    Medical: No    Non-medical: No  Tobacco Use  . Smoking status: Never Smoker  . Smokeless tobacco: Never Used  Substance and Sexual Activity  . Alcohol use: Yes    Comment: rarely  . Drug use: No  . Sexual activity: Never  Lifestyle  . Physical activity:  Days per week: 7 days    Minutes per session: 10 min  . Stress: Only a little  Relationships  . Social connections:    Talks on phone: More than three times a week    Gets together: More than three times a week    Attends religious service: 1 to 4 times per year    Active member of club or organization: Yes    Attends meetings of clubs or organizations: Never    Relationship status: Widowed  Other Topics Concern  . Not on file  Social History Narrative   Social History      Diet? Regular (shouldn't have excessive amts of acid, hot &spicey foods)      Do you drink/eat things with caffeine? yes      Marital status?       widow                             What year were you married? [place] you live in a house, apartment, assisted living, condo, trailer, etc.? apartment      Is it one or more stories? 1      How many persons live in your home? 1 (self)      Do you have any pets in your home? (please list) no no !!      Highest level of education completed?      Current or past profession: hairdresser      Do you exercise?      little                                Type & how often? Leg lifts and plank, stretching every day.      Advanced Directives      Do you have a living will?  yes      Do you have a DNR form?         yes                         If not, do you want to discuss one? yes      Do you have signed POA/HPOA for forms? yes      Functional Status       Do you have difficulty bathing or dressing yourself?      Do you have difficulty preparing food or eating?       Do you have difficulty managing your medications?      Do you have difficulty managing your finances?      Do you have difficulty affording your medications?    reports that she has never smoked. She has never used smokeless tobacco. She reports current alcohol use. She reports that she does not use drugs.  Functional Status Survey:    Family History  Problem Relation Age of Onset  . Breast cancer Mother        died at 27  . Stroke Mother   . Heart disease Father        died at 71  . Cirrhosis Sister        died at 46  . Heart disease Brother        died at 52  . Pneumonia Sister        died at 86 months  .  Heart disease Sister        died at 65  . Cancer - Other Sister        stomach; died at 26  . Stomach cancer Sister   . Heart disease Brother        c-diff and sepsis; died at 45  . Colon cancer Neg Hx   . Diabetes Neg Hx   . Rectal cancer Neg Hx   . Esophageal cancer Neg Hx     Health Maintenance  Topic Date Due  . MAMMOGRAM  09/10/2018  . DEXA SCAN  09/10/2019  . TETANUS/TDAP  01/02/2020  . INFLUENZA VACCINE  Completed  . PNA vac Low Risk Adult  Completed    Allergies  Allergen Reactions  . Actonel [Risedronate Sodium] Other (See Comments)    Joint pain  . Amoxicillin Diarrhea  . Erythromycin Other (See Comments)    Gi upset  . Lipitor [Atorvastatin Calcium]   . Zetia [Ezetimibe]     Joint pain    Outpatient Encounter Medications as of 03/12/2018  Medication Sig  . acetaminophen (TYLENOL) 500 MG tablet Take 500 mg by mouth every 6 (six) hours as needed.  . budesonide (ENTOCORT EC) 3 MG 24 hr capsule Take 3 mg by mouth every other day.  . Cholecalciferol (VITAMIN D) 2000 units CAPS Take by mouth daily.   Marland Kitchen denosumab (PROLIA) 60 MG/ML SOSY injection Inject into the skin.  Marland Kitchen estradiol (ESTRACE) 0.1 MG/GM vaginal cream Place 1  Applicatorful vaginally at bedtime.  . famotidine (PEPCID) 40 MG tablet Take 1 tablet (40 mg total) by mouth 2 (two) times daily.  . flurazepam (DALMANE) 15 MG capsule Take 1 capsule (15 mg total) by mouth at bedtime as needed for sleep. 2-3 times a week.  . fluticasone (FLONASE) 50 MCG/ACT nasal spray Place 2 sprays into both nostrils daily. Sinus drainage  . GuaiFENesin (MUCINEX PO) Take 1 tablet by mouth every 12 (twelve) hours as needed (congestion).   . hydrocortisone 2.5 % cream Apply topically 2 (two) times daily.  Marland Kitchen ipratropium (ATROVENT) 0.03 % nasal spray Place 1 spray into both nostrils 2 (two) times daily. As directed nasal spray  . Melatonin 5 MG TABS Take by mouth. Take 1 tab 4-5 times a week  . Soft Lens Products (SALINE SENSITIVE EYES) SOLN Place 1 drop into both eyes 3 (three) times daily.  Marland Kitchen tobramycin-dexamethasone (TOBRADEX) ophthalmic solution Place 1 drop into both eyes as needed.  . [DISCONTINUED] BIOTIN PO Take 5,000 mcg by mouth daily.    No facility-administered encounter medications on file as of 03/12/2018.     Review of Systems  Constitutional: Negative for chills, fever and malaise/fatigue.  Eyes: Positive for blurred vision.       Glasses  Respiratory: Negative for cough and shortness of breath.   Cardiovascular: Negative for chest pain, palpitations and leg swelling.  Gastrointestinal: Positive for diarrhea. Negative for abdominal pain, blood in stool, constipation and melena.       Colitis improved, but still has some bad days  Genitourinary: Negative for dysuria and hematuria.  Musculoskeletal: Positive for back pain and joint pain. Negative for falls and neck pain.  Skin: Positive for rash.       Small patches of scaly skin that are circular on arms, legs and shoulders  Neurological: Negative for dizziness and loss of consciousness.  Endo/Heme/Allergies: Bruises/bleeds easily.  Psychiatric/Behavioral: Negative for depression. The patient is not  nervous/anxious and does not have insomnia.  Vitals:   03/12/18 1018  BP: 132/70  Pulse: 75  Temp: 98.3 F (36.8 C)  TempSrc: Oral  SpO2: 97%  Weight: 123 lb (55.8 kg)  Height: 5' 2"  (1.575 m)   Body mass index is 22.5 kg/m. Physical Exam Vitals signs reviewed.  Constitutional:      General: She is not in acute distress.    Appearance: Normal appearance. She is normal weight. She is not toxic-appearing.  HENT:     Head: Normocephalic and atraumatic.     Right Ear: Tympanic membrane, ear canal and external ear normal.     Left Ear: Tympanic membrane, ear canal and external ear normal.     Nose: Congestion present.     Mouth/Throat:     Mouth: Mucous membranes are moist.     Pharynx: Oropharynx is clear.     Comments: Postnasal drip which appears white Eyes:     Extraocular Movements: Extraocular movements intact.     Conjunctiva/sclera: Conjunctivae normal.     Pupils: Pupils are equal, round, and reactive to light.     Comments: glasses  Cardiovascular:     Rate and Rhythm: Normal rate and regular rhythm.     Pulses: Normal pulses.     Heart sounds: Normal heart sounds.  Pulmonary:     Effort: Pulmonary effort is normal.     Breath sounds: Normal breath sounds.  Abdominal:     General: Bowel sounds are normal. There is no distension.     Palpations: Abdomen is soft. There is no mass.     Tenderness: There is no abdominal tenderness. There is no guarding or rebound.  Musculoskeletal: Normal range of motion.     Comments: Kyphoscoliosis with decreased costoiliac space right more than left; walked inn w/o her rollator walker today (got ride over from security)  Skin:    General: Skin is warm and dry.     Capillary Refill: Capillary refill takes less than 2 seconds.     Comments: Multiple cm sized round scaly areas on arms, legs, right shoulder area   Neurological:     General: No focal deficit present.     Mental Status: She is alert and oriented to person,  place, and time.     Cranial Nerves: No cranial nerve deficit.     Motor: No weakness.     Gait: Gait normal.  Psychiatric:        Mood and Affect: Mood normal.        Behavior: Behavior normal.     Labs reviewed: Basic Metabolic Panel: Recent Labs    09/23/17 1427 09/27/17 0904 12/06/17 1516  NA 130* 131* 137  K 5.3* 5.1 4.7  CL 97 97 104  CO2 28 28 23   GLUCOSE 101* 73 104*  BUN 25* 20 22  CREATININE 1.01 0.99 0.92*  CALCIUM 10.4  10.2 10.1 9.8   Liver Function Tests: Recent Labs    05/03/17 1016 06/17/17 1539 09/23/17 1427 12/06/17 1516  AST 15 15 15 17   ALT 7 8 9 9   ALKPHOS 39 28* 31*  --   BILITOT 0.3 0.3 0.2 0.3  PROT 6.5 6.5 6.8 6.2  ALBUMIN 3.9 4.1 4.1  --    No results for input(s): LIPASE, AMYLASE in the last 8760 hours. No results for input(s): AMMONIA in the last 8760 hours. CBC: Recent Labs    06/17/17 1539 09/23/17 1427 12/06/17 1516  WBC 4.7 7.1 5.2  NEUTROABS 2.5 5.0 3,416  HGB 9.9*  10.6* 9.8*  HCT 29.1* 31.8* 29.4*  MCV 93.5 94.1 92.2  PLT 213.0 245.0 243   Cardiac Enzymes: No results for input(s): CKTOTAL, CKMB, CKMBINDEX, TROPONINI in the last 8760 hours. BNP: Invalid input(s): POCBNP Lab Results  Component Value Date   HGBA1C 5.4 09/23/2017   Lab Results  Component Value Date   TSH 1.48 12/06/2017   Lab Results  Component Value Date   VITAMINB12 343 02/10/2018   Lab Results  Component Value Date   FOLATE 9.1 12/06/2017   Lab Results  Component Value Date   IRON 46 12/06/2017   TIBC 239 (L) 12/06/2017   FERRITIN 608 (H) 12/06/2017   Assessment/Plan 1. Senile osteoporosis -cont prolia, due next week, cont vitamin D and some minimal weight bearing exercise with walking as tolerated and PT exercises  2. Collagenous colitis -ongoing, cont budesonide -if bothering her more days than not with loose bms, she will call Dr. Bridgette Habermann al and request oral prednisone therapy which previously "knocked it out"  3. Benign  essential hypertension -bp at goal with current regimen, monitor  4. Spinal stenosis of lumbar region without neurogenic claudication -cont tylenol use, but not to exceed 3067m per day as reviewed with her today; is using glucosamine-chondroitin and tart cherry which just recently started as well as cbd capsules all of which she thinks may be starting to help -if none of these are helpful and derm does not think there's a psoriatic component, would begin prn tramadol possibly for pain  5. Thoracogenic scoliosis of thoracolumbar region -ongoing for her, also contributes to pain, see #4  6. Skin plaque -multiple, small round plaques--? Guttate or nummular psoriasis vs just areas of dermatitis -she will check with derm about this in case there's any relation to here arthritis  7. Generalized osteoarthritis of multiple sites -suspect this is what she's got not psoriatic arthritis which is rare -pain regimen as discussed above, avoid tramadol unless topicals and otcs are not working  8. B12 deficiency -cont repletion with monthly injections--next was to be given in April so we will follow that pattern also  9. Primary hyperparathyroidism (HOval -check pth with next labs  Labs/tests ordered:  Pth, cbc, cmp, b12 before 3 mo check up  Tage Feggins L. Nasirah Sachs, D.O. [facility] [address] [address]Cell Phone (Mon-Fri 8am-5pm):  [phone] Call:  [phone]& follow prompts after 5pm & weekends Office Phone:  [phone] Fax:  [phone]

## 2018-03-14 ENCOUNTER — Telehealth: Payer: Self-pay | Admitting: *Deleted

## 2018-03-14 DIAGNOSIS — M2550 Pain in unspecified joint: Secondary | ICD-10-CM

## 2018-03-14 DIAGNOSIS — L988 Other specified disorders of the skin and subcutaneous tissue: Secondary | ICD-10-CM

## 2018-03-14 NOTE — Telephone Encounter (Signed)
Pt has changed her mind about seeing a rheumatologist and would like a referral to see one ASAP, whoever Dr. Mariea Clonts suggests.

## 2018-03-14 NOTE — Telephone Encounter (Signed)
Referral placed to Dr. Gavin Pound (also close to [place]).

## 2018-03-17 ENCOUNTER — Ambulatory Visit: Payer: PPO

## 2018-03-19 ENCOUNTER — Other Ambulatory Visit: Payer: Self-pay

## 2018-03-19 ENCOUNTER — Ambulatory Visit: Payer: PPO | Admitting: *Deleted

## 2018-03-19 DIAGNOSIS — M810 Age-related osteoporosis without current pathological fracture: Secondary | ICD-10-CM

## 2018-03-19 MED ORDER — DENOSUMAB 60 MG/ML ~~LOC~~ SOSY
60.0000 mg | PREFILLED_SYRINGE | Freq: Once | SUBCUTANEOUS | Status: AC
  Administered 2018-03-19: 60 mg via SUBCUTANEOUS

## 2018-03-26 ENCOUNTER — Encounter: Payer: Medicare HMO | Admitting: Internal Medicine

## 2018-03-26 ENCOUNTER — Telehealth: Payer: Self-pay | Admitting: *Deleted

## 2018-03-26 NOTE — Telephone Encounter (Signed)
Patient called and left message on Clinical intake stating that there was a miscommunication between her and [facility]. Stated that she wants her Vitamin B12 injection there at [facility] today. Patient would like to speak with [facility]

## 2018-03-27 DIAGNOSIS — D649 Anemia, unspecified: Secondary | ICD-10-CM | POA: Diagnosis not present

## 2018-03-27 DIAGNOSIS — M48061 Spinal stenosis, lumbar region without neurogenic claudication: Secondary | ICD-10-CM | POA: Diagnosis not present

## 2018-03-27 DIAGNOSIS — M2550 Pain in unspecified joint: Secondary | ICD-10-CM | POA: Diagnosis not present

## 2018-03-27 DIAGNOSIS — Z6822 Body mass index (BMI) 22.0-22.9, adult: Secondary | ICD-10-CM | POA: Diagnosis not present

## 2018-03-27 DIAGNOSIS — R5383 Other fatigue: Secondary | ICD-10-CM | POA: Diagnosis not present

## 2018-03-27 DIAGNOSIS — M150 Primary generalized (osteo)arthritis: Secondary | ICD-10-CM | POA: Diagnosis not present

## 2018-03-27 DIAGNOSIS — K52831 Collagenous colitis: Secondary | ICD-10-CM | POA: Diagnosis not present

## 2018-03-27 DIAGNOSIS — R21 Rash and other nonspecific skin eruption: Secondary | ICD-10-CM | POA: Diagnosis not present

## 2018-03-27 NOTE — Telephone Encounter (Signed)
B12 was not listed as one of the labs checked by rheumatology.  Let's wait on her level in her labs before we meet next.  Then we will have a good baseline to see how deficient she gets.  It will help [place] to know how often she should receive injections.

## 2018-03-27 NOTE — Telephone Encounter (Signed)
Pt calling again confused on when she should get her b12 and if she should be tested, I advised pt that she will be getting labs before her next visit with Dr. Mariea Clonts. Also pt is wondering if her b12 level could be included with Dr. Lenna Gilford labs ( office note in your folder) pt was seen today by Dr. Lenna Gilford Rheumatology

## 2018-03-28 NOTE — Telephone Encounter (Signed)
Spoke with patient and advised results, pt will wait to get labs. Pt also states she has no energy and thinks she needs all the energy she needs right now.

## 2018-04-01 LAB — BASIC METABOLIC PANEL
BUN: 19 (ref 4–21)
Creatinine: 0.8 (ref 0.5–1.1)
Glucose: 108
Potassium: 4.9 (ref 3.4–5.3)
Sodium: 132 — AB (ref 137–147)

## 2018-04-01 LAB — HEPATIC FUNCTION PANEL
ALT: 12 (ref 7–35)
AST: 20 (ref 13–35)
Alkaline Phosphatase: 38 (ref 25–125)
Bilirubin, Total: 0.2

## 2018-04-01 LAB — CBC AND DIFFERENTIAL
HCT: 26 — AB (ref 36–46)
Hemoglobin: 8.5 — AB (ref 12.0–16.0)
Neutrophils Absolute: 5
Platelets: 307 (ref 150–399)
WBC: 7.8

## 2018-04-01 LAB — POCT ERYTHROCYTE SEDIMENTATION RATE, NON-AUTOMATED: Sed Rate: 80

## 2018-04-02 ENCOUNTER — Encounter: Payer: Self-pay | Admitting: *Deleted

## 2018-04-05 LAB — IRON,TIBC AND FERRITIN PANEL
%SAT: 10
Iron: 25
TIBC: 250
UIBC: 225

## 2018-04-07 ENCOUNTER — Telehealth: Payer: Self-pay | Admitting: *Deleted

## 2018-04-07 NOTE — Telephone Encounter (Signed)
Patient called and stated that she saw the Rheumatologist last week, Dr. Lenna Gilford. Stated that she was going to send you the labs she took. Stated she reported her Hemoglobin was 8.5 and she was going to check her Iron but hasn't heard anything. Patient wanted to know if you have received anything. Patient has been unsteady on her feet and tired. Patient has an appointment next week with [facility] for a Vitamin B12 injection. Please Advise.

## 2018-04-07 NOTE — Telephone Encounter (Signed)
I have gotten all of the tests from Dr. Trudie ' office except the iron panel which it appears was added later to the previously drawn blood.  Likely her anemia is worse due to missing her B12 injections.   She should keep her appt at [facility] with [facility] for her B12 injection.  Dr. Trudie  had asked her to take prednisone for her aches and pains in her shoulders and hips thinking she may have polymyalgia rheumatica.  Is she feeling any better on the prednisone?

## 2018-04-07 NOTE — Telephone Encounter (Signed)
Noted.  We'll see how that iron panel turned out when Dr. Trudie  gets it back.

## 2018-04-07 NOTE — Telephone Encounter (Signed)
Patient stated that she was doing much better, stated the Prednisone was a miracle. Stated she will be there to get her B12 inj. Patient stated that she had to get iron drips in the past.

## 2018-04-08 ENCOUNTER — Encounter: Payer: Self-pay | Admitting: *Deleted

## 2018-04-10 NOTE — Telephone Encounter (Signed)
I thought I remembered that.  Not all iron supplements have the same side effect.  Ferrous sulfate often does, but nu-iron is a different formulation. I'd like her to try it.

## 2018-04-10 NOTE — Telephone Encounter (Signed)
Received iron panel (ferritin still pending it appears).  The portion that was back appears consistent with iron deficiency also.  So, her anemia is a mixture of iron deficiency AND B12 deficiency.  I recommend she start on nu-iron 150mg  po daily.  We will monitor her blood counts over time as she takes this and gets her b12 shots consistently.

## 2018-04-10 NOTE — Telephone Encounter (Signed)
Spoke with patient and she can't take supplements, she has taken a iron drip before. Per pt the supplements give her diarrhea. Please advise

## 2018-04-14 MED ORDER — POLYSACCHARIDE IRON COMPLEX 150 MG PO CAPS: 150.0000 mg | ORAL_CAPSULE | Freq: Every day | ORAL | 1 refills | Status: DC

## 2018-04-14 NOTE — Addendum Note (Signed)
Addended by: Despina Hidden on: 04/14/2018 08:51 AM   Modules accepted: Orders

## 2018-04-14 NOTE — Telephone Encounter (Signed)
rx sent to pharmacy by e-script Spoke with patient and advised results   

## 2018-04-16 ENCOUNTER — Other Ambulatory Visit: Payer: Self-pay

## 2018-04-16 ENCOUNTER — Ambulatory Visit (INDEPENDENT_AMBULATORY_CARE_PROVIDER_SITE_OTHER): Payer: PPO | Admitting: *Deleted

## 2018-04-16 DIAGNOSIS — E538 Deficiency of other specified B group vitamins: Secondary | ICD-10-CM

## 2018-04-16 DIAGNOSIS — Z85828 Personal history of other malignant neoplasm of skin: Secondary | ICD-10-CM | POA: Diagnosis not present

## 2018-04-16 DIAGNOSIS — D485 Neoplasm of uncertain behavior of skin: Secondary | ICD-10-CM | POA: Diagnosis not present

## 2018-04-16 DIAGNOSIS — G301 Alzheimer's disease with late onset: Secondary | ICD-10-CM | POA: Diagnosis not present

## 2018-04-16 DIAGNOSIS — F0281 Dementia in other diseases classified elsewhere with behavioral disturbance: Secondary | ICD-10-CM | POA: Diagnosis not present

## 2018-04-16 DIAGNOSIS — D1801 Hemangioma of skin and subcutaneous tissue: Secondary | ICD-10-CM | POA: Diagnosis not present

## 2018-04-16 DIAGNOSIS — L821 Other seborrheic keratosis: Secondary | ICD-10-CM | POA: Diagnosis not present

## 2018-04-16 DIAGNOSIS — F331 Major depressive disorder, recurrent, moderate: Secondary | ICD-10-CM | POA: Diagnosis not present

## 2018-04-16 DIAGNOSIS — F064 Anxiety disorder due to known physiological condition: Secondary | ICD-10-CM | POA: Diagnosis not present

## 2018-04-16 DIAGNOSIS — Z08 Encounter for follow-up examination after completed treatment for malignant neoplasm: Secondary | ICD-10-CM | POA: Diagnosis not present

## 2018-04-16 DIAGNOSIS — F5101 Primary insomnia: Secondary | ICD-10-CM | POA: Diagnosis not present

## 2018-04-16 MED ORDER — CYANOCOBALAMIN 1000 MCG/ML IJ SOLN
1000.0000 ug | Freq: Once | INTRAMUSCULAR | Status: AC
  Administered 2018-04-16: 1000 ug via INTRAMUSCULAR

## 2018-04-17 ENCOUNTER — Ambulatory Visit: Payer: PPO

## 2018-04-22 DIAGNOSIS — D509 Iron deficiency anemia, unspecified: Secondary | ICD-10-CM | POA: Diagnosis not present

## 2018-04-23 DIAGNOSIS — M150 Primary generalized (osteo)arthritis: Secondary | ICD-10-CM | POA: Diagnosis not present

## 2018-04-23 DIAGNOSIS — M2550 Pain in unspecified joint: Secondary | ICD-10-CM | POA: Diagnosis not present

## 2018-04-23 DIAGNOSIS — K52831 Collagenous colitis: Secondary | ICD-10-CM | POA: Diagnosis not present

## 2018-04-23 DIAGNOSIS — R21 Rash and other nonspecific skin eruption: Secondary | ICD-10-CM | POA: Diagnosis not present

## 2018-04-23 DIAGNOSIS — Z1589 Genetic susceptibility to other disease: Secondary | ICD-10-CM | POA: Diagnosis not present

## 2018-04-23 DIAGNOSIS — M48061 Spinal stenosis, lumbar region without neurogenic claudication: Secondary | ICD-10-CM | POA: Diagnosis not present

## 2018-04-23 DIAGNOSIS — D509 Iron deficiency anemia, unspecified: Secondary | ICD-10-CM | POA: Diagnosis not present

## 2018-04-23 DIAGNOSIS — M353 Polymyalgia rheumatica: Secondary | ICD-10-CM | POA: Diagnosis not present

## 2018-04-28 ENCOUNTER — Telehealth: Payer: Self-pay | Admitting: *Deleted

## 2018-04-28 DIAGNOSIS — F419 Anxiety disorder, unspecified: Secondary | ICD-10-CM

## 2018-04-28 MED ORDER — FLURAZEPAM HCL 15 MG PO CAPS: 15.0000 mg | ORAL_CAPSULE | Freq: Every evening | ORAL | 1 refills | Status: DC | PRN

## 2018-04-28 NOTE — Telephone Encounter (Signed)
Pt may take maximum one capsule daily of the flurazepam.  More than that is not safe for her due to her age and fall risk.

## 2018-04-28 NOTE — Telephone Encounter (Signed)
Patient called and stated that she needs a refill on her flurazepam 15mg . Stated that Dr. Jonni Sanger (Previous PCP) changed her to 1 capsule 2-3 times/ week but patient stated that she is taking more due to taking Prednisone. Patient wants it changed back to 1-2 capsules daily with a 3 month supply. Stated that she is use to getting it 3 months at a time. Please Advise.

## 2018-04-28 NOTE — Telephone Encounter (Signed)
Patient Notified and agreed. 

## 2018-05-06 DIAGNOSIS — D509 Iron deficiency anemia, unspecified: Secondary | ICD-10-CM | POA: Diagnosis not present

## 2018-05-21 ENCOUNTER — Other Ambulatory Visit: Payer: Self-pay

## 2018-05-21 ENCOUNTER — Ambulatory Visit: Payer: PPO | Admitting: *Deleted

## 2018-05-21 DIAGNOSIS — E538 Deficiency of other specified B group vitamins: Secondary | ICD-10-CM

## 2018-05-21 DIAGNOSIS — H40013 Open angle with borderline findings, low risk, bilateral: Secondary | ICD-10-CM | POA: Diagnosis not present

## 2018-05-21 DIAGNOSIS — H40053 Ocular hypertension, bilateral: Secondary | ICD-10-CM | POA: Diagnosis not present

## 2018-05-21 MED ORDER — CYANOCOBALAMIN 1000 MCG/ML IJ SOLN
1000.0000 ug | Freq: Once | INTRAMUSCULAR | Status: AC
  Administered 2018-05-21: 14:00:00 1000 ug via INTRAMUSCULAR

## 2018-05-23 ENCOUNTER — Telehealth: Payer: Self-pay

## 2018-05-23 NOTE — Telephone Encounter (Addendum)
Patient states her and [name] had a conversation about doing a random blood sugar check. Patient states [name] did not have the equipment at the time of her B12 visit last week and she would like for [facility] to check her blood sugar on next Wednesday if possible.   Patient will also check with Mliss Sax or Katharine Look (Independent Nurses at [facility]) to see if they would be willing to check and if so she will call and let [facility] know.   Patient aware [name] is out of office today and will return Tuesday 05/27/2018

## 2018-05-27 ENCOUNTER — Telehealth: Payer: Self-pay | Admitting: *Deleted

## 2018-05-27 NOTE — Telephone Encounter (Signed)
A message was left, re: follow up visit. 

## 2018-06-06 ENCOUNTER — Ambulatory Visit: Payer: Medicare HMO | Admitting: Family Medicine

## 2018-06-10 ENCOUNTER — Encounter: Payer: Self-pay | Admitting: Internal Medicine

## 2018-06-10 DIAGNOSIS — D519 Vitamin B12 deficiency anemia, unspecified: Secondary | ICD-10-CM | POA: Diagnosis not present

## 2018-06-10 DIAGNOSIS — K52832 Lymphocytic colitis: Secondary | ICD-10-CM | POA: Diagnosis not present

## 2018-06-10 DIAGNOSIS — R5383 Other fatigue: Secondary | ICD-10-CM | POA: Diagnosis not present

## 2018-06-10 DIAGNOSIS — I10 Essential (primary) hypertension: Secondary | ICD-10-CM | POA: Diagnosis not present

## 2018-06-10 DIAGNOSIS — E559 Vitamin D deficiency, unspecified: Secondary | ICD-10-CM | POA: Diagnosis not present

## 2018-06-10 DIAGNOSIS — L988 Other specified disorders of the skin and subcutaneous tissue: Secondary | ICD-10-CM | POA: Diagnosis not present

## 2018-06-10 DIAGNOSIS — D649 Anemia, unspecified: Secondary | ICD-10-CM | POA: Diagnosis not present

## 2018-06-10 DIAGNOSIS — M810 Age-related osteoporosis without current pathological fracture: Secondary | ICD-10-CM | POA: Diagnosis not present

## 2018-06-10 LAB — CBC AND DIFFERENTIAL
HCT: 33 — AB (ref 36–46)
Hemoglobin: 11.4 — AB (ref 12.0–16.0)
Platelets: 219 (ref 150–399)
WBC: 6.6

## 2018-06-10 LAB — BASIC METABOLIC PANEL
BUN: 18 (ref 4–21)
Creatinine: 0.7 (ref 0.5–1.1)
Glucose: 81
Potassium: 4.9 (ref 3.4–5.3)
Sodium: 138 (ref 137–147)

## 2018-06-10 LAB — HEPATIC FUNCTION PANEL
ALT: 16 (ref 7–35)
AST: 20 (ref 13–35)
Alkaline Phosphatase: 31 (ref 25–125)
Bilirubin, Total: 0.3

## 2018-06-10 LAB — VITAMIN D 25 HYDROXY (VIT D DEFICIENCY, FRACTURES): Vit D, 25-Hydroxy: 43.89

## 2018-06-10 LAB — VITAMIN B12: Vitamin B-12: 655

## 2018-06-10 LAB — IRON,TIBC AND FERRITIN PANEL: Iron: 119

## 2018-06-11 ENCOUNTER — Encounter: Payer: Self-pay | Admitting: Internal Medicine

## 2018-06-18 ENCOUNTER — Other Ambulatory Visit: Payer: Self-pay

## 2018-06-18 ENCOUNTER — Encounter: Payer: Self-pay | Admitting: Internal Medicine

## 2018-06-18 ENCOUNTER — Non-Acute Institutional Stay: Payer: PPO | Admitting: Internal Medicine

## 2018-06-18 VITALS — BP 122/60 | HR 86 | Temp 98.2°F | Ht 62.0 in | Wt 126.0 lb

## 2018-06-18 DIAGNOSIS — D692 Other nonthrombocytopenic purpura: Secondary | ICD-10-CM

## 2018-06-18 DIAGNOSIS — M353 Polymyalgia rheumatica: Secondary | ICD-10-CM | POA: Diagnosis not present

## 2018-06-18 DIAGNOSIS — E210 Primary hyperparathyroidism: Secondary | ICD-10-CM | POA: Diagnosis not present

## 2018-06-18 DIAGNOSIS — I5032 Chronic diastolic (congestive) heart failure: Secondary | ICD-10-CM

## 2018-06-18 DIAGNOSIS — M810 Age-related osteoporosis without current pathological fracture: Secondary | ICD-10-CM

## 2018-06-18 DIAGNOSIS — M5412 Radiculopathy, cervical region: Secondary | ICD-10-CM | POA: Diagnosis not present

## 2018-06-18 NOTE — Patient Instructions (Addendum)
Please check which vitamin D you are taking and how much and let me know.   Restart your multivitamin.

## 2018-06-18 NOTE — Progress Notes (Signed)
Location:  Occupational psychologist of Service:  Clinic (12)  Provider: Parag Dorton L. Mariea Clonts, D.O., C.M.D.  Code Status: DNR Goals of Care:  Advanced Directives 03/12/2018  Does Patient Have a Medical Advance Directive? Yes  Type of Paramedic of [place];Living will;Out of facility DNR (pink MOST or yellow form)  Does patient want to make changes to medical advance directive? No - Patient declined  Copy of [place] in Chart? Yes - validated most recent copy scanned in chart (See row information)  Pre-existing out of facility DNR order (yellow form or pink MOST form) Yellow form placed in chart (order not valid for inpatient use);Pink MOST form placed in chart (order not valid for inpatient use)     Chief Complaint  Patient presents with  . Medical Management of Chronic Issues    24mth follow-up    HPI: Patient is a 85 y.o. female seen today for medical management of chronic diseases.    She has not had pain since she took prednisone after her rheumatology appt.  She'd been struggling just to get out of bed.  She's been on a monthly reduction--she will be on 7.5mg  in the morning.  Dr. Trudie Geralda Baumgardner could describe her problem better than Ms. Allebach could.    She does have pain that runs up her left neck.  She wonders about a nerve.  It goes and comes.  She would not want to take medication for it.    When she squeezes dish cloths, she feels a discomfort from her wrists.    She notes small bruises on her arms--she bumped the left arm b/w her rooms.  Says she drifted to the side.  She is w/o her MVI for a week and wonders if she should restart.    We discussed her hyperparathyroidism and s/s of hypercalcemia.    They did not have nu-iron on hand.  She got two infusions instead with Dr. Trudie Darlin Stenseth' office.    Past Medical History:  Diagnosis Date  . Allergy   . Arthritis   . Back pain   . Blepharitis   . Cancer ([place])    basal cell  on face  . Candidal esophagitis ([place]) 2010  . Cataract   . Chest pain, atypical   . Collagenous colitis    Dr. Maurene Capes  . Diverticulosis   . Fuchs' corneal dystrophy   . GERD (gastroesophageal reflux disease)   . Heart murmur   . Hiatal hernia 2010  . History of fibromyalgia   . Hypercalcemia   . Hyperlipidemia   . Hypertension   . IBS (irritable bowel syndrome)   . Insomnia   . Osteoporosis   . Primary hyperparathyroidism ([place]) 03/11/2017  . Rectocele   . Scoliosis   . Shingles 1975   at waistline, no chronic pain.    Marland Kitchen Spinal stenosis   . Vaginal prolapse     Past Surgical History:  Procedure Laterality Date  . ABDOMINAL HYSTERECTOMY    . BREAST BIOPSY  1964   left benign  . CARDIOVASCULAR STRESS TEST  12/03/2007   EF 72%  . CATARACT EXTRACTION W/ INTRAOCULAR LENS  IMPLANT, BILATERAL    . COLONOSCOPY  04/24/2006   diverticulosis  . FLEXIBLE SIGMOIDOSCOPY  07/27/2010   diverticulosis, collagenous colitis  . TONSILLECTOMY AND ADENOIDECTOMY    . UPPER GASTROINTESTINAL ENDOSCOPY  01/29/2008   esophagitis, hiatal hernia, gastritis  . US ECHOCARDIOGRAPHY  10/30/2007    EF 55-60%  Allergies  Allergen Reactions  . Actonel [Risedronate Sodium] Other (See Comments)    Joint pain  . Amoxicillin Diarrhea  . Erythromycin Other (See Comments)    Gi upset  . Lipitor [Atorvastatin Calcium]   . Zetia [Ezetimibe]     Joint pain    Outpatient Encounter Medications as of 06/18/2018  Medication Sig  . acetaminophen (TYLENOL) 500 MG tablet Take 500 mg by mouth 3 (three) times daily as needed.  . Cholecalciferol (VITAMIN D) 2000 units CAPS Take 1 capsule by mouth daily.   Marland Kitchen denosumab (PROLIA) 60 MG/ML SOSY injection Inject into the skin.  Marland Kitchen estradiol (ESTRACE) 0.1 MG/GM vaginal cream Place 1 Applicatorful vaginally at bedtime.  . famotidine (PEPCID) 40 MG tablet Take 1 tablet (40 mg total) by mouth 2 (two) times daily.  . fexofenadine (ALLEGRA) 180 MG tablet Take 180 mg by mouth  daily.  . flurazepam (DALMANE) 15 MG capsule Take 1 capsule (15 mg total) by mouth at bedtime as needed for sleep.  . GuaiFENesin (MUCINEX PO) Take 1 tablet by mouth every 12 (twelve) hours as needed (congestion).   . hydrocortisone 2.5 % cream Apply topically 2 (two) times daily.  Marland Kitchen ipratropium (ATROVENT) 0.03 % nasal spray Place 1 spray into both nostrils 2 (two) times daily. As directed nasal spray  . iron polysaccharides (NU-IRON) 150 MG capsule Take 1 capsule (150 mg total) by mouth daily.  . Melatonin 5 MG TABS Take 1 tablet by mouth at bedtime as needed.   . Misc Natural Products (TART CHERRY ADVANCED PO) Take 1 tablet by mouth daily.   . Multiple Vitamin (MULTIVITAMIN) tablet Take 1 tablet by mouth daily.  . predniSONE (DELTASONE) 5 MG tablet Take 7.5 mg by mouth daily.  . Soft Lens Products (SALINE SENSITIVE EYES) SOLN Place 1 drop into both eyes 3 (three) times daily.  Marland Kitchen UNABLE TO FIND Med Name: CBD take daily  . [DISCONTINUED] budesonide (ENTOCORT EC) 3 MG 24 hr capsule Take 3 mg by mouth every other day.  . [DISCONTINUED] fluticasone (FLONASE) 50 MCG/ACT nasal spray Place 2 sprays into both nostrils daily. Sinus drainage  . [DISCONTINUED] Misc Natural Products (GLUCOSAMINE CHONDROITIN ADV) TABS Take 1 tablet by mouth daily.   . [DISCONTINUED] tobramycin-dexamethasone (TOBRADEX) ophthalmic solution Place 1 drop into both eyes as needed.   No facility-administered encounter medications on file as of 06/18/2018.     Review of Systems:  Review of Systems  Constitutional: Negative for chills, fever and malaise/fatigue.  HENT: Negative for congestion.   Eyes: Negative for blurred vision.       Glasses  Respiratory: Negative for cough and shortness of breath.   Cardiovascular: Negative for chest pain, palpitations and leg swelling.  Gastrointestinal: Negative for abdominal pain.  Genitourinary: Negative for dysuria.  Musculoskeletal: Positive for neck pain.  Skin: Negative for  itching and rash.       Easy bruising on arms  Neurological: Positive for tingling and sensory change. Negative for dizziness and loss of consciousness.       Left neck and into side of her head  Endo/Heme/Allergies: Bruises/bleeds easily.  Psychiatric/Behavioral: Negative for depression and memory loss. The patient is not nervous/anxious and does not have insomnia.     Health Maintenance  Topic Date Due  . INFLUENZA VACCINE  08/02/2018  . MAMMOGRAM  09/10/2018  . DEXA SCAN  09/10/2019  . TETANUS/TDAP  01/02/2020  . PNA vac Low Risk Adult  Completed    Physical Exam: Vitals:  06/18/18 1356  BP: 122/60  Pulse: 86  Temp: 98.2 F (36.8 C)  TempSrc: Oral  SpO2: 99%  Weight: 126 lb (57.2 kg)  Height: 5\' 2"  (1.575 m)   Body mass index is 23.05 kg/m. Physical Exam Vitals signs reviewed.  Constitutional:      General: She is not in acute distress.    Appearance: Normal appearance. She is not toxic-appearing.  HENT:     Head: Normocephalic and atraumatic.  Cardiovascular:     Rate and Rhythm: Normal rate and regular rhythm.     Heart sounds: Murmur present.  Pulmonary:     Effort: Pulmonary effort is normal.     Breath sounds: Normal breath sounds. No wheezing, rhonchi or rales.  Abdominal:     General: Bowel sounds are normal.  Musculoskeletal: Normal range of motion.     Comments: Tenderness over left cervical paravertebral muscles   Skin:    General: Skin is warm and dry.     Comments: A few small ecchymoses of forearms  Neurological:     General: No focal deficit present.     Mental Status: She is alert and oriented to person, place, and time. Mental status is at baseline.  Psychiatric:        Mood and Affect: Mood normal.        Behavior: Behavior normal.        Thought Content: Thought content normal.        Judgment: Judgment normal.     Labs reviewed: Basic Metabolic Panel: Recent Labs    09/23/17 1427 09/27/17 0904 12/06/17 1516 04/01/18  06/10/18 0400  NA 130* 131* 137 132* 138  K 5.3* 5.1 4.7 4.9 4.9  CL 97 97 104  --   --   CO2 28 28 23   --   --   GLUCOSE 101* 73 104*  --   --   BUN 25* 20 22 19 18   CREATININE 1.01 0.99 0.92* 0.8 0.7  CALCIUM 10.4  10.2 10.1 9.8  --   --   TSH  --   --  1.48  --   --    Liver Function Tests: Recent Labs    09/23/17 1427 12/06/17 1516 04/01/18 06/10/18 0400  AST 15 17 20 20   ALT 9 9 12 16   ALKPHOS 31*  --  38 31  BILITOT 0.2 0.3  --   --   PROT 6.8 6.2  --   --   ALBUMIN 4.1  --   --   --    No results for input(s): LIPASE, AMYLASE in the last 8760 hours. No results for input(s): AMMONIA in the last 8760 hours. CBC: Recent Labs    09/23/17 1427 12/06/17 1516 03/27/18 06/10/18 0400  WBC 7.1 5.2 7.8 6.6  NEUTROABS 5.0 3,416 5  --   HGB 10.6* 9.8* 8.5* 11.4*  HCT 31.8* 29.4* 26* 33*  MCV 94.1 92.2  --   --   PLT 245.0 243 307 219   Lipid Panel: Recent Labs    12/06/17 1516  CHOL 189  HDL 79  LDLCALC 89  TRIG 112  CHOLHDL 2.4   Lab Results  Component Value Date   HGBA1C 5.4 09/23/2017    Assessment/Plan 1. Primary hyperparathyroidism (HCC) -PTH has been trending up though she's not taking calcium -calcium level in normal range -cont vitamin D3 -continue to monitor PTH and calcium levels--if calcium staying high, would consider starting on calcitriol; however, I'm not entirely certain  she has primary hyperpara--could have secondary from her osteoporosis--monitor at this time  2. Chronic diastolic CHF (congestive heart failure), NYHA class 1 (HCC) -no signs of acute exacerbation, not on regular medication for this -follows with Dr. Stanford Breed and says she is going to contact their office about appt  3. Senile osteoporosis -cont prolia, vitamin D3 2000 units daily (she does not recall what type of vitamin D she has or how much and will call our office with information)--I thought we clarified this last time, but she may have forgotten  4. Polymyalgia  rheumatica (HCC) -continue tapering prednisone thru Dr. Trudie Narelle Schoening  5. Left cervical radiculopathy -suspect from OA in neck--goes up left side of head -does not want medication for it as it comes and goes--monitor  6. Senile purpura ([place]) -plus steroid effect on arms--educated on terminology  Labs/tests ordered:  Intact PTH, bmp, 25 OH vitamin D before  Next appt:  06/25/2018 b12, sept prolia and October see me with the labs before  Yuan Gann L. Dorthea Maina, D.O. [facility] [address], [place] [address] Cell Phone (Mon-Fri 8am-5pm):  [phone] On Call:  [phone] & follow prompts after 5pm & weekends Office Phone:  [phone] Office Fax:  [phone]

## 2018-06-19 ENCOUNTER — Telehealth: Payer: Self-pay | Admitting: *Deleted

## 2018-06-19 NOTE — Telephone Encounter (Signed)
Patient stated that she saw you yesterday and you wanted her to call back with Clarification on her Vitamin D.   Vitamin D3 1107mcg (5000iu) takes 3 tablets weekly.

## 2018-06-19 NOTE — Telephone Encounter (Signed)
It's ok for her to continue to take her vitamin D as she is taking it.  Please update her medication list.

## 2018-06-23 ENCOUNTER — Telehealth: Payer: Self-pay | Admitting: *Deleted

## 2018-06-23 MED ORDER — CYANOCOBALAMIN 1000 MCG/ML IJ SOLN: 1000.0000 ug | INTRAMUSCULAR | 0 refills | Status: DC

## 2018-06-23 NOTE — Telephone Encounter (Signed)
Called to speak with patient about her getting charged for the b12 injection at [facility]. Pt's insurance does not cover her b12 being given thru the clinic at [place], pt will not get b12 thru [facility] clinic at [place] , the clinic nurse at [place] will now give the b12. I have sent the rx for b12 to pt's pharmacy.  Spoke with clinic nurse at b12 and they are agreeable to this. Order faxed over to [facility].

## 2018-06-25 ENCOUNTER — Ambulatory Visit: Payer: Self-pay

## 2018-06-26 ENCOUNTER — Ambulatory Visit (INDEPENDENT_AMBULATORY_CARE_PROVIDER_SITE_OTHER): Payer: PPO

## 2018-06-26 ENCOUNTER — Other Ambulatory Visit: Payer: Self-pay

## 2018-06-26 DIAGNOSIS — E538 Deficiency of other specified B group vitamins: Secondary | ICD-10-CM | POA: Diagnosis not present

## 2018-06-26 MED ORDER — CYANOCOBALAMIN 1000 MCG/ML IJ SOLN
1000.0000 ug | Freq: Once | INTRAMUSCULAR | Status: AC
  Administered 2018-06-26: 1000 ug via INTRAMUSCULAR

## 2018-07-08 ENCOUNTER — Telehealth: Payer: Self-pay

## 2018-07-08 NOTE — Telephone Encounter (Addendum)
.  left message to have patient return my call.   Pt would need to speak with billing to get the ok to have the injection at [facility], if not pt will get a bill if given at [facility]. I also spoke with office manager Faythe Dingwall to try and explain to pt.

## 2018-07-08 NOTE — Telephone Encounter (Addendum)
Patient called to schedule an appointment for B12 injection at [facility]. I advised patient that per verbal conversation that I had with [name] she will need to get injection here at [facility] or we can send rx to her pharmacy and the independent nurse at [facility] can administer.  Patient stated this is all a mixup and she needs to speak directly with [facility].  Patient aware message will be forward to [facility]

## 2018-07-08 NOTE — Telephone Encounter (Signed)
Patient called office asking for [name], informed patient that [name] is working remotely today and I would inform [name] of her call. Patient stated she did not answer due to unrecognizable number. Let patient know someone would call her back.  Informed [name] that patient called office to speak with her. [name] stated that she needs to speak with [name] or [name], there nothing I can do about a bill at [facility] for her b12. She could no longer assist her with questions and needed to be handled by upper management.   Routing messages to both [name].

## 2018-07-09 NOTE — Telephone Encounter (Signed)
Spoke with Misti, DON at [facility] and she also spoke with patient and advised that pt can get b12 at the satellite office at [place] with no problem. I advised to Misti that I sent the b12 to [facility] and all pt needs to do is give to the independent nurse to administer.

## 2018-07-23 DIAGNOSIS — M48061 Spinal stenosis, lumbar region without neurogenic claudication: Secondary | ICD-10-CM | POA: Diagnosis not present

## 2018-07-23 DIAGNOSIS — D509 Iron deficiency anemia, unspecified: Secondary | ICD-10-CM | POA: Diagnosis not present

## 2018-07-23 DIAGNOSIS — M150 Primary generalized (osteo)arthritis: Secondary | ICD-10-CM | POA: Diagnosis not present

## 2018-07-23 DIAGNOSIS — Z1589 Genetic susceptibility to other disease: Secondary | ICD-10-CM | POA: Diagnosis not present

## 2018-07-23 DIAGNOSIS — M353 Polymyalgia rheumatica: Secondary | ICD-10-CM | POA: Diagnosis not present

## 2018-07-23 DIAGNOSIS — K52831 Collagenous colitis: Secondary | ICD-10-CM | POA: Diagnosis not present

## 2018-07-23 DIAGNOSIS — M2550 Pain in unspecified joint: Secondary | ICD-10-CM | POA: Diagnosis not present

## 2018-07-23 LAB — HEPATIC FUNCTION PANEL
ALT: 18 (ref 7–35)
AST: 20 (ref 13–35)
Alkaline Phosphatase: 28 (ref 25–125)

## 2018-07-23 LAB — BASIC METABOLIC PANEL
BUN: 23 — AB (ref 4–21)
Creatinine: 1.1 (ref 0.5–1.1)
Glucose: 89
Potassium: 4.5 (ref 3.4–5.3)
Sodium: 134 — AB (ref 137–147)

## 2018-07-23 LAB — POCT ERYTHROCYTE SEDIMENTATION RATE, NON-AUTOMATED: Sed Rate: 19

## 2018-07-23 LAB — CBC AND DIFFERENTIAL
HCT: 34 — AB (ref 36–46)
Hemoglobin: 11.4 — AB (ref 12.0–16.0)
Neutrophils Absolute: 8
Platelets: 206 (ref 150–399)
WBC: 10.9

## 2018-07-29 ENCOUNTER — Other Ambulatory Visit: Payer: Self-pay

## 2018-07-29 ENCOUNTER — Ambulatory Visit (INDEPENDENT_AMBULATORY_CARE_PROVIDER_SITE_OTHER): Payer: PPO

## 2018-07-29 ENCOUNTER — Encounter: Payer: Self-pay | Admitting: *Deleted

## 2018-07-29 DIAGNOSIS — E538 Deficiency of other specified B group vitamins: Secondary | ICD-10-CM

## 2018-07-29 DIAGNOSIS — F419 Anxiety disorder, unspecified: Secondary | ICD-10-CM

## 2018-07-29 MED ORDER — CYANOCOBALAMIN 1000 MCG/ML IJ SOLN
1000.0000 ug | Freq: Once | INTRAMUSCULAR | Status: AC
  Administered 2018-07-29: 1000 ug via INTRAMUSCULAR

## 2018-07-29 MED ORDER — FLURAZEPAM HCL 15 MG PO CAPS: 15.0000 mg | ORAL_CAPSULE | Freq: Every evening | ORAL | 1 refills | Status: DC | PRN

## 2018-07-29 NOTE — Telephone Encounter (Signed)
Patient was seen today for B12 injection. Patient requested refill while in exam room for Flurazepam 15 mg tab and would like a 3 month supply. Verified patient's pharmacy [name], last filled 05/01/18 according to [place] data base for 90 tablets with 1 refill. Last appointment 06/18/2018 at [place] with Dr. Mariea Clonts. Next appointment 10/22/2018 with Dr. Mariea Clonts. Routing to provider for approval.

## 2018-08-07 ENCOUNTER — Telehealth: Payer: Self-pay

## 2018-08-07 NOTE — Telephone Encounter (Signed)
Patient called today and stated she had received several calls from restricted number and believed it was someone from [facility]. She states she spoke with [name] yesterday and was told she would receive a call back in the next day or so. She states she does not need a return call unless someone called her.   Faythe Dingwall was unavailable to speak with patient. So took message to report to [name].

## 2018-08-26 ENCOUNTER — Telehealth: Payer: Self-pay | Admitting: *Deleted

## 2018-08-26 NOTE — Telephone Encounter (Signed)
Patient called and stated that she wants an appointment tomorrow at [facility] to get her Vitamin B12 Injection with [facility].   I reviewed patient's chart and it stated:  Beth Wise, CMA     4:08 PM Note   Spoke with Beth Wise, DON at [facility] and she also spoke with patient and advised that pt can get b12 at the satellite office at [facility] with no problem. I advised to Beth Wise that I sent the b12 to [facility] and all pt needs to do is give to the independent nurse to administer.         I tried to explain to the patient that the injection goes through the independent nurse there at [facility] or she can come to our office and patient got upset with me and stated that she spoke to her Concierge through [facility] [employer], Beth Wise and he stated that it was no problem/issue for patient to get her injection through Dr. Mariea Wise at [facility] and that we are coding it wrong, stated that we code a 12 not an 11. She stated that if we can't get this right she will just get her injection somewhere else and hung up.

## 2018-08-26 NOTE — Telephone Encounter (Signed)
I'm not sure what to do at this point, everyone has talked to her. I DO NOT mind giving her the shot but she will get a bill and then call us to complain. [facility] is not [place] office, the code is correct because I'm not giving it at [place] is her home so it's coded like that.

## 2018-08-27 ENCOUNTER — Other Ambulatory Visit: Payer: Self-pay | Admitting: *Deleted

## 2018-08-27 MED ORDER — CYANOCOBALAMIN 1000 MCG/ML IJ SOLN: 1000.0000 ug | INTRAMUSCULAR | 0 refills | Status: DC

## 2018-08-27 NOTE — Telephone Encounter (Signed)
Faxed info for auth to [employer] 08/27/18. Once we receive auth approval from [employer], pt can sch inj.   If she wants to get her inj prior to waiting for this auth, please inform her that she may receive a bill again. We have confirmed with [employer] that code(12) is correct & does require auth IF given at [facility].  Thanks,  Kathyrn Lass Referral Coordinator

## 2018-08-27 NOTE — Telephone Encounter (Signed)
This patient has been around and around everyone at [facility] and at [place], I spoke with another nurse manager Ebony Hail her at [facility] to let her know she can get the b12 here at [facility] just NOT [place]!! Can someone give me a price to see that the cost will be in the clinic

## 2018-08-27 NOTE — Telephone Encounter (Signed)
I spoke with Beth Wise, Engineer, building services and she stated that Beth Wise has spoken to [employer] and they stated that patient would need a prior authorization for the B12 injections monthly.  Beth Wise stated that we can send this to [name] to obtain the authorization and then she can make the appointment and place in the appointment note that it was approved.   Message forwarded to [facility] for authorization.

## 2018-08-28 NOTE — Telephone Encounter (Signed)
Bryson Ha, DON at [place], called me last evening and said pt decided after many discussions that she will continue to come to the main office due to paying $2.50 rather than the 5.00 she has to pay to get it done at [place] (her home).

## 2018-08-29 ENCOUNTER — Ambulatory Visit (INDEPENDENT_AMBULATORY_CARE_PROVIDER_SITE_OTHER): Payer: PPO

## 2018-08-29 ENCOUNTER — Other Ambulatory Visit: Payer: Self-pay

## 2018-08-29 DIAGNOSIS — E538 Deficiency of other specified B group vitamins: Secondary | ICD-10-CM | POA: Diagnosis not present

## 2018-08-29 MED ORDER — CYANOCOBALAMIN 1000 MCG/ML IJ SOLN
1000.0000 ug | Freq: Once | INTRAMUSCULAR | Status: AC
  Administered 2018-08-29: 1000 ug via INTRAMUSCULAR

## 2018-09-01 ENCOUNTER — Ambulatory Visit: Payer: PPO

## 2018-09-12 ENCOUNTER — Telehealth: Payer: Self-pay

## 2018-09-12 NOTE — Telephone Encounter (Signed)
Beth Wise with [employer] called on 3-way with Beth  Loma Wise and Beth Wise was calling to see if we could schedule patient for an appointment at [facility] for b12 injection. Per Beth Wise we were suppose to do a prior authfroization to get her approved to have injection at [place] vs coming to the [facility] location on [address] and Beth Wise in our billing department told her it was approved.  I placed patient and Loma Wise on a brief hold while I consulted with Beth Wise. Per Beth Wise and Beth Wise, petroleum over heard conversation) a PA was initiated by Beth Wise (Psychologist, forensic).   Beth Wise reviewed papers on Beth Wise's (out of office today) desk and seen approval for PA from 08/27/2018-11/25/2018.   I reviewed patients appointment desk prior to resuming call and noticed that she was scheduled already and a note was placed regarding B12 PA approval.   I resumed call and informed patient and Loma Wise she was approved and an appointment was already scheduled for 10/01/2018. Patient then asked why no one had called her to inform her that she was approved to get at [facility].   I apologized patient was not contacted about this and told her the good news is she is approved.   Patient then questioned why did we bill it wrong in the first place and cause all this drama around her getting B12 at [facility] vs [place], I advise patient that I was unable to further assist with billing questions and offered to transfer her to the billing department. Beth Wise with [employer] stated there was no need to transfer for she would further converse with patient and answer any billing questions or clarifications that she needs.

## 2018-09-15 ENCOUNTER — Telehealth: Payer: Self-pay | Admitting: *Deleted

## 2018-09-15 DIAGNOSIS — Z803 Family history of malignant neoplasm of breast: Secondary | ICD-10-CM | POA: Diagnosis not present

## 2018-09-15 DIAGNOSIS — Z1231 Encounter for screening mammogram for malignant neoplasm of breast: Secondary | ICD-10-CM | POA: Diagnosis not present

## 2018-09-15 LAB — HM MAMMOGRAPHY

## 2018-09-15 NOTE — Telephone Encounter (Signed)
Patient called and stated that she is due her Prolia injection on Wednesday. Patient is wanting to know if the Prolia can be called into her pharmacy and she pick it up and bring it so it will save her money.  Please Advise.

## 2018-09-15 NOTE — Telephone Encounter (Signed)
Spoke with Ms Measel & she agreed that since her prolia has been ordered, approved & received for injection here at [facility] by [name] at [facility]),  I will make note for her prolia to be called into [facility] in March 2021 for pick up.  Ms Perrigo was informed by [employer] rep that her copay for prolia thru pharmacy would be $180 vs [facility] billing her $255 copay provided by [employer].  Thanks, Kathyrn Lass

## 2018-09-15 NOTE — Telephone Encounter (Signed)
[name] can you please explain the Prolia process and the pt's co-pay.

## 2018-09-17 ENCOUNTER — Encounter: Payer: Self-pay | Admitting: *Deleted

## 2018-09-24 ENCOUNTER — Ambulatory Visit: Payer: PPO

## 2018-09-24 ENCOUNTER — Other Ambulatory Visit: Payer: Self-pay

## 2018-09-24 DIAGNOSIS — M810 Age-related osteoporosis without current pathological fracture: Secondary | ICD-10-CM | POA: Diagnosis not present

## 2018-09-24 MED ORDER — DENOSUMAB 60 MG/ML ~~LOC~~ SOSY
60.0000 mg | PREFILLED_SYRINGE | Freq: Once | SUBCUTANEOUS | Status: AC
  Administered 2018-09-24: 60 mg via SUBCUTANEOUS

## 2018-09-29 DIAGNOSIS — M2550 Pain in unspecified joint: Secondary | ICD-10-CM | POA: Diagnosis not present

## 2018-09-29 DIAGNOSIS — M353 Polymyalgia rheumatica: Secondary | ICD-10-CM | POA: Diagnosis not present

## 2018-09-29 DIAGNOSIS — K52831 Collagenous colitis: Secondary | ICD-10-CM | POA: Diagnosis not present

## 2018-09-29 DIAGNOSIS — D509 Iron deficiency anemia, unspecified: Secondary | ICD-10-CM | POA: Diagnosis not present

## 2018-09-29 DIAGNOSIS — M48061 Spinal stenosis, lumbar region without neurogenic claudication: Secondary | ICD-10-CM | POA: Diagnosis not present

## 2018-09-29 DIAGNOSIS — M150 Primary generalized (osteo)arthritis: Secondary | ICD-10-CM | POA: Diagnosis not present

## 2018-10-01 ENCOUNTER — Ambulatory Visit (INDEPENDENT_AMBULATORY_CARE_PROVIDER_SITE_OTHER): Payer: PPO | Admitting: *Deleted

## 2018-10-01 ENCOUNTER — Other Ambulatory Visit: Payer: Self-pay

## 2018-10-01 ENCOUNTER — Ambulatory Visit: Payer: PPO

## 2018-10-01 VITALS — BP 112/60 | HR 74 | Temp 98.5°F

## 2018-10-01 DIAGNOSIS — E538 Deficiency of other specified B group vitamins: Secondary | ICD-10-CM | POA: Diagnosis not present

## 2018-10-01 MED ORDER — CYANOCOBALAMIN 1000 MCG/ML IJ SOLN: 1000.0000 ug | INTRAMUSCULAR | 0 refills | Status: DC

## 2018-10-01 MED ORDER — CYANOCOBALAMIN 1000 MCG/ML IJ SOLN
1000.0000 ug | Freq: Once | INTRAMUSCULAR | Status: AC
  Administered 2018-10-01: 1000 ug via INTRAMUSCULAR

## 2018-10-08 ENCOUNTER — Ambulatory Visit: Payer: PPO

## 2018-10-08 ENCOUNTER — Other Ambulatory Visit: Payer: Self-pay

## 2018-10-14 DIAGNOSIS — I10 Essential (primary) hypertension: Secondary | ICD-10-CM | POA: Diagnosis not present

## 2018-10-14 DIAGNOSIS — E559 Vitamin D deficiency, unspecified: Secondary | ICD-10-CM | POA: Diagnosis not present

## 2018-10-14 DIAGNOSIS — E210 Primary hyperparathyroidism: Secondary | ICD-10-CM | POA: Diagnosis not present

## 2018-10-14 DIAGNOSIS — D649 Anemia, unspecified: Secondary | ICD-10-CM | POA: Diagnosis not present

## 2018-10-14 DIAGNOSIS — R5383 Other fatigue: Secondary | ICD-10-CM | POA: Diagnosis not present

## 2018-10-14 LAB — BASIC METABOLIC PANEL
BUN: 23 — AB (ref 4–21)
Creatinine: 1 (ref 0.5–1.1)
Glucose: 91
Potassium: 4.7 (ref 3.4–5.3)
Sodium: 136 — AB (ref 137–147)

## 2018-10-14 LAB — VITAMIN D 25 HYDROXY (VIT D DEFICIENCY, FRACTURES): Vit D, 25-Hydroxy: 52.2

## 2018-10-15 ENCOUNTER — Encounter: Payer: Self-pay | Admitting: *Deleted

## 2018-10-22 ENCOUNTER — Other Ambulatory Visit: Payer: Self-pay

## 2018-10-22 ENCOUNTER — Non-Acute Institutional Stay: Payer: PPO | Admitting: Internal Medicine

## 2018-10-22 ENCOUNTER — Encounter: Payer: Self-pay | Admitting: Internal Medicine

## 2018-10-22 VITALS — BP 130/70 | HR 96 | Temp 98.4°F | Ht 62.0 in | Wt 134.0 lb

## 2018-10-22 DIAGNOSIS — E538 Deficiency of other specified B group vitamins: Secondary | ICD-10-CM | POA: Diagnosis not present

## 2018-10-22 DIAGNOSIS — M810 Age-related osteoporosis without current pathological fracture: Secondary | ICD-10-CM | POA: Diagnosis not present

## 2018-10-22 DIAGNOSIS — M353 Polymyalgia rheumatica: Secondary | ICD-10-CM | POA: Diagnosis not present

## 2018-10-22 DIAGNOSIS — J300 Vasomotor rhinitis: Secondary | ICD-10-CM | POA: Diagnosis not present

## 2018-10-22 DIAGNOSIS — Z66 Do not resuscitate: Secondary | ICD-10-CM

## 2018-10-22 DIAGNOSIS — F5101 Primary insomnia: Secondary | ICD-10-CM

## 2018-10-22 DIAGNOSIS — I5032 Chronic diastolic (congestive) heart failure: Secondary | ICD-10-CM

## 2018-10-22 MED ORDER — IPRATROPIUM BROMIDE 0.03 % NA SOLN: 2.0000 | Freq: Two times a day (BID) | NASAL | 1 refills | Status: DC | PRN

## 2018-10-22 NOTE — Progress Notes (Signed)
Location:  Occupational psychologist of Service:  Clinic (12)  Provider: Aracelys Glade L. Mariea Clonts, D.O., C.M.D.  Code Status: DNR Goals of Care:  Advanced Directives 10/22/2018  Does Patient Have a Medical Advance Directive? Yes  Type of Paramedic of [place];Out of facility DNR (pink MOST or yellow form)  Does patient want to make changes to medical advance directive? No - Patient declined  Copy of [place] in Chart? Yes - validated most recent copy scanned in chart (See row information)  Pre-existing out of facility DNR order (yellow form or pink MOST form) Yellow form placed in chart (order not valid for inpatient use);Pink MOST form placed in chart (order not valid for inpatient use)     Chief Complaint  Patient presents with  . Medical Management of Chronic Issues    4 month follow-up. Patient c/o SOB   . Medication Management    Discuss nasal sprays flonase vs atrovent, which one would you recommend. Sleep medication ineffective, patient wakes up in the middle of the night.   . Leg Swelling    Leg swelling off/on.     HPI: Patient is a 85 y.o. female seen today for medical management of chronic diseases.    She feels tired in her upper legs and kind of breathless to do much.  She has a slight bit of swelling below the knee by the time she gets ready for bed.  Does have a bunion.  Ankles do get a little stiff by night. Mentions oxygen was 94% at [facility] and she was sob, but not anymore.    Right MCP arthritis tolerable with tylenol.    A few years ago when she was going to another doctor, both atrovent and flonase were prescribed.  She wonders which one she should use.  Her problem has not been congestion, but rhinitis alone.  So we opted not to renew any flonase.    She skipped her sleep pill two nights in a row.  She went to bed early and planned to get up to urinate before sleeping, but fell asleep and woke up 2 hrs later.   Got up to urinate and then could not go back to sleep.  Wound up drinking sleepytime tea.  She was awake about three hours.    Has only three swallows of water with her tylenol plus or minus her sleeping pill.  The benzo only lasts 5 hrs.    Past Medical History:  Diagnosis Date  . Allergy   . Arthritis   . Back pain   . Blepharitis   . Cancer ([place])    basal cell on face  . Candidal esophagitis ([place]) 2010  . Cataract   . Chest pain, atypical   . Collagenous colitis    Dr. Maurene Capes  . Diverticulosis   . Fuchs' corneal dystrophy   . GERD (gastroesophageal reflux disease)   . Heart murmur   . Hiatal hernia 2010  . History of fibromyalgia   . Hypercalcemia   . Hyperlipidemia   . Hypertension   . IBS (irritable bowel syndrome)   . Insomnia   . Osteoporosis   . Primary hyperparathyroidism ([place]) 03/11/2017  . Rectocele   . Scoliosis   . Shingles 1975   at waistline, no chronic pain.    Marland Kitchen Spinal stenosis   . Vaginal prolapse     Past Surgical History:  Procedure Laterality Date  . ABDOMINAL HYSTERECTOMY    .  BREAST BIOPSY  1964   left benign  . CARDIOVASCULAR STRESS TEST  12/03/2007   EF 72%  . CATARACT EXTRACTION W/ INTRAOCULAR LENS  IMPLANT, BILATERAL    . COLONOSCOPY  04/24/2006   diverticulosis  . FLEXIBLE SIGMOIDOSCOPY  07/27/2010   diverticulosis, collagenous colitis  . TONSILLECTOMY AND ADENOIDECTOMY    . UPPER GASTROINTESTINAL ENDOSCOPY  01/29/2008   esophagitis, hiatal hernia, gastritis  . US ECHOCARDIOGRAPHY  10/30/2007    EF 55-60%    Allergies  Allergen Reactions  . Actonel [Risedronate Sodium] Other (See Comments)    Joint pain  . Amoxicillin Diarrhea  . Erythromycin Other (See Comments)    Gi upset  . Lipitor [Atorvastatin Calcium]   . Zetia [Ezetimibe]     Joint pain    Outpatient Encounter Medications as of 10/22/2018  Medication Sig  . acetaminophen (TYLENOL) 500 MG tablet Take 500 mg by mouth 3 (three) times daily as needed.  .  Cholecalciferol (VITAMIN D) 125 MCG (5000 UT) CAPS Take 1 tablet by mouth 3 times weekly  . denosumab (PROLIA) 60 MG/ML SOSY injection Inject into the skin.  Marland Kitchen estradiol (ESTRACE) 0.1 MG/GM vaginal cream Place 1 Applicatorful vaginally at bedtime.  . famotidine (PEPCID) 40 MG tablet Take 1 tablet (40 mg total) by mouth 2 (two) times daily.  . flurazepam (DALMANE) 15 MG capsule Take 1 capsule (15 mg total) by mouth at bedtime as needed for sleep.  . GuaiFENesin (MUCINEX PO) Take 1 tablet by mouth every 12 (twelve) hours as needed (congestion).   . hydrocortisone 2.5 % cream Apply topically 2 (two) times daily.  Marland Kitchen ipratropium (ATROVENT) 0.03 % nasal spray Place 1 spray into both nostrils 2 (two) times daily. As directed nasal spray  . LYSINE PO Take 3 tablets by mouth daily. 1500 mg  . Melatonin 5 MG TABS Take 1 tablet by mouth at bedtime as needed.   . Misc Natural Products (TART CHERRY ADVANCED PO) Take 1 tablet by mouth daily.   . Multiple Vitamin (MULTIVITAMIN) tablet Take 1 tablet by mouth daily.  . predniSONE (DELTASONE) 1 MG tablet Take 2 mg by mouth daily with breakfast. Along with a 5 mg for a total of 7 mg daily  . predniSONE (DELTASONE) 5 MG tablet Take 5 mg by mouth daily. Along with a 1 mg  . Soft Lens Products (SALINE SENSITIVE EYES) SOLN Place 1 drop into both eyes 3 (three) times daily.  . [DISCONTINUED] fexofenadine (ALLEGRA) 180 MG tablet Take 180 mg by mouth daily.  . [DISCONTINUED] iron polysaccharides (NU-IRON) 150 MG capsule Take 1 capsule (150 mg total) by mouth daily. (Patient not taking: Reported on 10/22/2018)  . [DISCONTINUED] UNABLE TO FIND Med Name: CBD take daily   No facility-administered encounter medications on file as of 10/22/2018.     Review of Systems:  Review of Systems  Constitutional: Negative for chills, fever and malaise/fatigue.  HENT: Positive for hearing loss.   Eyes: Negative for blurred vision.       Glasses  Respiratory: Negative for cough and  shortness of breath.   Cardiovascular: Positive for leg swelling. Negative for chest pain and palpitations.       Some edema end of days (not at present)  Gastrointestinal: Negative for abdominal pain, blood in stool, constipation, diarrhea and melena.  Genitourinary: Negative for dysuria.  Musculoskeletal: Positive for joint pain. Negative for falls.  Skin: Negative for itching and rash.  Neurological: Negative for dizziness and loss of consciousness.  Endo/Heme/Allergies:  Bruises/bleeds easily.  Psychiatric/Behavioral: Negative for depression and memory loss. The patient is nervous/anxious and has insomnia.     Health Maintenance  Topic Date Due  . DEXA SCAN  09/10/2019  . MAMMOGRAM  09/15/2019  . TETANUS/TDAP  01/02/2020  . INFLUENZA VACCINE  Completed  . PNA vac Low Risk Adult  Completed    Physical Exam: Vitals:   10/22/18 1405  BP: 130/70  Pulse: 96  Temp: 98.4 F (36.9 C)  TempSrc: Oral  SpO2: 98%  Weight: 134 lb (60.8 kg)  Height: 5\' 2"  (1.575 m)   Body mass index is 24.51 kg/m. Physical Exam Vitals signs reviewed.  Constitutional:      General: She is not in acute distress.    Appearance: Normal appearance. She is not ill-appearing or toxic-appearing.  HENT:     Head: Normocephalic and atraumatic.  Cardiovascular:     Rate and Rhythm: Normal rate and regular rhythm.  Pulmonary:     Effort: Pulmonary effort is normal.     Breath sounds: Normal breath sounds. No wheezing, rhonchi or rales.  Abdominal:     General: Bowel sounds are normal.  Musculoskeletal: Normal range of motion.     Right lower leg: No edema.     Left lower leg: No edema.  Skin:    General: Skin is warm and dry.     Capillary Refill: Capillary refill takes less than 2 seconds.  Neurological:     General: No focal deficit present.     Mental Status: She is alert and oriented to person, place, and time.  Psychiatric:        Mood and Affect: Mood normal.     Labs reviewed: Basic  Metabolic Panel: Recent Labs    12/06/17 1516  06/10/18 0400 07/23/18 10/14/18  NA 137   < > 138 134* 136*  K 4.7   < > 4.9 4.5 4.7  CL 104  --   --   --   --   CO2 23  --   --   --   --   GLUCOSE 104*  --   --   --   --   BUN 22   < > 18 23* 23*  CREATININE 0.92*   < > 0.7 1.1 1.0  CALCIUM 9.8  --   --   --   --   TSH 1.48  --   --   --   --    < > = values in this interval not displayed.   Liver Function Tests: Recent Labs    12/06/17 1516 04/01/18 06/10/18 0400 07/23/18  AST 17 20 20 20   ALT 9 12 16 18   ALKPHOS  --  38 31 28  BILITOT 0.3  --   --   --   PROT 6.2  --   --   --    No results for input(s): LIPASE, AMYLASE in the last 8760 hours. No results for input(s): AMMONIA in the last 8760 hours. CBC: Recent Labs    12/06/17 1516 03/27/18 06/10/18 0400 07/23/18  WBC 5.2 7.8 6.6 10.9  NEUTROABS 3,416 5  --  8  HGB 9.8* 8.5* 11.4* 11.4*  HCT 29.4* 26* 33* 34*  MCV 92.2  --   --   --   PLT 243 307 219 206   Lipid Panel: Recent Labs    12/06/17 1516  CHOL 189  HDL 79  LDLCALC 89  TRIG 112  CHOLHDL 2.4  Lab Results  Component Value Date   HGBA1C 5.4 09/23/2017   Assessment/Plan 1. Chronic diastolic CHF (congestive heart failure), NYHA class 1 (HCC) -sounds like she may have been having an exacerbation of CHF when seen at [facility], but it has resolved now--edema and breathing improved, sats normal  2. Senile osteoporosis -continues on prolia therapy, vitamin D level is at goal now, continue it, does have hyperparathyroid also  3. Vitamin B12 deficiency -continue b12 monthly injections  4. Polymyalgia rheumatica (HCC) -continue gradual prednisone weaning through [facility] -is doing well  5. Vasomotor rhinitis -no further flonase -cont ipratropium nasal spray  6. Primary insomnia -I have previously educated her on risks of benzos for sleep -she is trying not to take it every night, is using sleepytime tea at times -reminded importance of daytime  activity and she is active   7. DNR (do not resuscitate) - Do not attempt resuscitation (DNR)  Labs/tests ordered:  No new as has visit with [facility] also around the same time and likely will have labs there Next appt:  12/10/2018 with me Keep regular b12 and prolia injection appts as scheduled  Dandria Griego L. Argel Pablo, D.O. [facility] [address], [place] [address] Cell Phone (Mon-Fri 8am-5pm):  [phone] On Call:  [phone] & follow prompts after 5pm & weekends Office Phone:  [phone] Office Fax:  [phone]

## 2018-11-05 ENCOUNTER — Ambulatory Visit: Payer: PPO

## 2018-11-05 ENCOUNTER — Other Ambulatory Visit: Payer: Self-pay

## 2018-11-05 DIAGNOSIS — E538 Deficiency of other specified B group vitamins: Secondary | ICD-10-CM | POA: Diagnosis not present

## 2018-11-05 MED ORDER — CYANOCOBALAMIN 1000 MCG/ML IJ SOLN
1000.0000 ug | Freq: Once | INTRAMUSCULAR | Status: AC
  Administered 2018-11-05: 1000 ug via INTRAMUSCULAR

## 2018-11-25 ENCOUNTER — Telehealth: Payer: Self-pay | Admitting: Internal Medicine

## 2018-11-25 NOTE — Telephone Encounter (Signed)
Spoke with Ms Leverton regarding B12 & prolia injs & ins changes within our facility.  She would like to continue receiving injs at [facility]. I have explained that getting B12 & prolia at [facility] will not require auth.  She is asking if Dr Mariea Clonts would call in a rx of B12 & prolia to [name] so they can giver her a estimated amount of what her pharmacy copay would be for these medications.  I'm passing on this message. She's not due for prolia until 03/25/19!   Thanks, [name].

## 2018-11-26 ENCOUNTER — Other Ambulatory Visit: Payer: Self-pay | Admitting: Internal Medicine

## 2018-11-26 ENCOUNTER — Telehealth: Payer: Self-pay | Admitting: Internal Medicine

## 2018-11-26 DIAGNOSIS — E538 Deficiency of other specified B group vitamins: Secondary | ICD-10-CM

## 2018-11-26 DIAGNOSIS — M810 Age-related osteoporosis without current pathological fracture: Secondary | ICD-10-CM

## 2018-11-26 MED ORDER — DENOSUMAB 60 MG/ML ~~LOC~~ SOSY: 60.0000 mg | PREFILLED_SYRINGE | SUBCUTANEOUS | 0 refills | Status: DC

## 2018-11-26 MED ORDER — CYANOCOBALAMIN 1000 MCG/ML IJ SOLN: 1000.0000 ug | INTRAMUSCULAR | 11 refills | Status: DC

## 2018-11-26 NOTE — Telephone Encounter (Signed)
Ms Beth Wise has called today bc she's concerned about continuing her prolia bc what she's reading about her immune system, other issues that she has that she feels may contribute to the long term of being on prolia.  Ms Beth Wise would like a call from Dr Beth Wise when available to discuss before appt on 12/10/18. She's also unhappy with ins arrangements for injections too.  We have explained the ins benefits to Ms [facility] & what they expect numerous times.  Thanks, [name]

## 2018-11-26 NOTE — Telephone Encounter (Signed)
I sent them both in for her with a note on the prolia about when it's actually due.  My concern is that it will not be authorized and they'll tell her she owes the full amount b/c it's not time yet.  I hope that does not happen.

## 2018-11-26 NOTE — Telephone Encounter (Signed)
The prolia is not going to have any negative effects on her immune system.  She's been tolerating it well so far and, as long as it is affordable for her, I'd like for her to continue.

## 2018-12-10 ENCOUNTER — Non-Acute Institutional Stay: Payer: PPO | Admitting: Internal Medicine

## 2018-12-10 ENCOUNTER — Other Ambulatory Visit: Payer: Self-pay

## 2018-12-10 ENCOUNTER — Ambulatory Visit: Payer: Self-pay

## 2018-12-10 ENCOUNTER — Encounter: Payer: Self-pay | Admitting: Internal Medicine

## 2018-12-10 VITALS — BP 126/82 | HR 84 | Temp 97.3°F | Ht 62.0 in | Wt 132.4 lb

## 2018-12-10 DIAGNOSIS — M810 Age-related osteoporosis without current pathological fracture: Secondary | ICD-10-CM

## 2018-12-10 DIAGNOSIS — M353 Polymyalgia rheumatica: Secondary | ICD-10-CM | POA: Diagnosis not present

## 2018-12-10 DIAGNOSIS — Z0000 Encounter for general adult medical examination without abnormal findings: Secondary | ICD-10-CM

## 2018-12-10 DIAGNOSIS — H04123 Dry eye syndrome of bilateral lacrimal glands: Secondary | ICD-10-CM | POA: Diagnosis not present

## 2018-12-10 DIAGNOSIS — N816 Rectocele: Secondary | ICD-10-CM

## 2018-12-10 DIAGNOSIS — D692 Other nonthrombocytopenic purpura: Secondary | ICD-10-CM | POA: Diagnosis not present

## 2018-12-10 NOTE — Progress Notes (Signed)
Provider:  Rexene Edison. Mariea Clonts, D.O., C.M.D. Location:  Occupational psychologist of Service:  Clinic (12)  Previous PCP: Gayland Curry, DO Patient Care Team: Gayland Curry, DO as PCP - General (Geriatric Medicine) Rolm Bookbinder, MD as Consulting Physician (Dermatology) Marti Sleigh, MD as Consulting Physician (Gynecology) Stanford Breed, Denice Bors, MD as Consulting Physician (Cardiology) Luberta Mutter, MD as Consulting Physician (Ophthalmology) Jola Baptist, [place] as Referring Physician (Chiropractic Medicine) Neomia Dear (Chiropractic Medicine) Starling Manns, MD (Orthopedic Surgery) Gatha Mayer, MD as Consulting Physician (Gastroenterology)  Extended Emergency Contact Information Primary Emergency Contact: Turlington,Lanelle Address: [address]          [place], [place] [address] Johnnette Litter of [place] Phone: [phone] Relation: Daughter Secondary Emergency Contact: Turlington,Ed  Johnnette Litter of [place] Phone: [phone] Mobile Phone: [phone] Relation: Son  Code Status: DNR Goals of Care: Advanced Directive information Advanced Directives 10/22/2018  Does Patient Have a Medical Advance Directive? Yes  Type of Paramedic of [place];Out of facility DNR (pink MOST or yellow form)  Does patient want to make changes to medical advance directive? No - Patient declined  Copy of [place] in Chart? Yes - validated most recent copy scanned in chart (See row information)  Pre-existing out of facility DNR order (yellow form or pink MOST form) Yellow form placed in chart (order not valid for inpatient use);Pink MOST form placed in chart (order not valid for inpatient use)   Chief Complaint  Patient presents with  . Annual Exam    Physical  . Acute Visit    questions about rectocele, skin concerns, bone density    HPI: Patient is a 85 y.o. female seen today for an annual physical exam.   She also has several questions for me.    Uses the ipratropium in her nose to dry up the secretions and she's read it can cause blurry vision as can prednisone and her fuchs dystrophy.  Advised that if she can do w/o the ipratropium, to try it.    Wants to occasionally use artificial tears.  Has prescription saline solution she is supposed to use and does not use regularly as directed.  She asks about prolia--bone density did improve from 2017 to 2019--does remain in osteoporosis range.    Has been to derm about her lip.  It was felt to be a change in her skin. She was on budesonide for her colitis, but came off it for colitis and then got started on prednisone.  Has one above lip, below lip, and on legs and arms.   Thinks they are from there steroids.  Discussed they may be age and sun-induced.  She then reported very good sun protection all her life to avoid getting freckles.  She also brought up her rectocele--reports she has seen several doctors for it, but none for about a year.  She has been having more irritation in her vulvar area and up inside due to the rectocele.  She's been tried on pessaries, but says that was 10 years ago so I suggested she reach out to her gyn about this b/c they should have improved considerably and may fit and work better for her now.  Past Medical History:  Diagnosis Date  . Allergy   . Arthritis   . Back pain   . Blepharitis   . Cancer ([facility])    basal cell on face  . Candidal esophagitis ([facility]) 2010  .  Cataract   . Chest pain, atypical   . Collagenous colitis    Dr. Maurene Capes  . Diverticulosis   . Fuchs' corneal dystrophy   . GERD (gastroesophageal reflux disease)   . Heart murmur   . Hiatal hernia 2010  . History of fibromyalgia   . Hypercalcemia   . Hyperlipidemia   . Hypertension   . IBS (irritable bowel syndrome)   . Insomnia   . Osteoporosis   . Primary hyperparathyroidism ([facility]) 03/11/2017  . Rectocele   . Scoliosis   . Shingles 1975   at  waistline, no chronic pain.    Marland Kitchen Spinal stenosis   . Vaginal prolapse    Past Surgical History:  Procedure Laterality Date  . ABDOMINAL HYSTERECTOMY    . BREAST BIOPSY  1964   left benign  . CARDIOVASCULAR STRESS TEST  12/03/2007   EF 72%  . CATARACT EXTRACTION W/ INTRAOCULAR LENS  IMPLANT, BILATERAL    . COLONOSCOPY  04/24/2006   diverticulosis  . FLEXIBLE SIGMOIDOSCOPY  07/27/2010   diverticulosis, collagenous colitis  . TONSILLECTOMY AND ADENOIDECTOMY    . UPPER GASTROINTESTINAL ENDOSCOPY  01/29/2008   esophagitis, hiatal hernia, gastritis  . US ECHOCARDIOGRAPHY  10/30/2007    EF 55-60%    reports that she has never smoked. She has never used smokeless tobacco. She reports current alcohol use. She reports that she does not use drugs.  Functional Status Survey:    Family History  Problem Relation Age of Onset  . Breast cancer Mother        died at 83  . Stroke Mother   . Heart disease Father        died at 62  . Cirrhosis Sister        died at 2  . Heart disease Brother        died at 29  . Pneumonia Sister        died at 20 months  . Heart disease Sister        died at 30  . Cancer - Other Sister        stomach; died at 39  . Stomach cancer Sister   . Heart disease Brother        c-diff and sepsis; died at 12  . Colon cancer Neg Hx   . Diabetes Neg Hx   . Rectal cancer Neg Hx   . Esophageal cancer Neg Hx     Health Maintenance  Topic Date Due  . DEXA SCAN  09/10/2019  . MAMMOGRAM  09/15/2019  . TETANUS/TDAP  01/02/2020  . INFLUENZA VACCINE  Completed  . PNA vac Low Risk Adult  Completed    Allergies  Allergen Reactions  . Actonel [Risedronate Sodium] Other (See Comments)    Joint pain  . Amoxicillin Diarrhea  . Erythromycin Other (See Comments)    Gi upset  . Lipitor [Atorvastatin Calcium]   . Zetia [Ezetimibe]     Joint pain    Outpatient Encounter Medications as of 12/10/2018  Medication Sig  . acetaminophen (TYLENOL) 500 MG tablet Take 500  mg by mouth 3 (three) times daily as needed.  . Cholecalciferol (VITAMIN D) 125 MCG (5000 UT) CAPS Take 1 tablet by mouth 3 times weekly  . cyanocobalamin (,VITAMIN B-12,) 1000 MCG/ML injection Inject 1 mL (1,000 mcg total) into the muscle every 30 (thirty) days.  Derrill Memo ON 03/25/2019] denosumab (PROLIA) 60 MG/ML SOSY injection Inject 60 mg into the skin every 6 (six) months.  Marland Kitchen  estradiol (ESTRACE) 0.1 MG/GM vaginal cream Place 1 Applicatorful vaginally at bedtime.  . famotidine (PEPCID) 40 MG tablet Take 1 tablet (40 mg total) by mouth 2 (two) times daily.  . flurazepam (DALMANE) 15 MG capsule Take 1 capsule (15 mg total) by mouth at bedtime as needed for sleep.  . GuaiFENesin (MUCINEX PO) Take 1 tablet by mouth every 12 (twelve) hours as needed (congestion).   . hydrocortisone 2.5 % cream Apply topically 2 (two) times daily.  Marland Kitchen ipratropium (ATROVENT) 0.03 % nasal spray Place 2 sprays into both nostrils 2 (two) times daily as needed for rhinitis. As directed nasal spray  . Melatonin 5 MG TABS Take 1 tablet by mouth at bedtime as needed (Takes an additional as needed when wakes up).   . Misc Natural Products (TART CHERRY ADVANCED PO) Take 1 tablet by mouth daily.   . Multiple Vitamin (MULTIVITAMIN) tablet Take 1 tablet by mouth daily.  . predniSONE (DELTASONE) 1 MG tablet Take 2 mg by mouth daily with breakfast. Along with a 5 mg for a total of 7 mg daily  . predniSONE (DELTASONE) 5 MG tablet Take 5 mg by mouth daily. Along with a 1 mg  . Soft Lens Products (SALINE SENSITIVE EYES) SOLN Place 1 drop into both eyes 3 (three) times daily.  . [DISCONTINUED] LYSINE PO Take 3 tablets by mouth daily. 1500 mg   No facility-administered encounter medications on file as of 12/10/2018.    Review of Systems  Constitutional: Negative for chills, fever and malaise/fatigue.  Eyes: Positive for blurred vision.       Glasses, fuch's dystrophy  Respiratory: Negative for cough and shortness of breath.    Cardiovascular: Negative for chest pain, palpitations, orthopnea, leg swelling and PND.       Reports her legs swell and feel tight end of day, tries to keep them up  Gastrointestinal: Negative for abdominal pain, blood in stool, constipation and melena.  Genitourinary: Negative for dysuria.       Has rectocele  Musculoskeletal: Positive for myalgias. Negative for falls.       Have been much better (PMR) with prednisone  Skin: Negative for itching and rash.       Several small red areas that peel on arms, legs     Vitals:   12/10/18 1448  BP: 126/82  Pulse: 84  Temp: (!) 97.3 F (36.3 C)  TempSrc: Oral  SpO2: 95%  Weight: 132 lb 6.4 oz (60.1 kg)  Height: 5\' 2"  (1.575 m)   Body mass index is 24.22 kg/m. Physical Exam Vitals reviewed.  Constitutional:      General: She is not in acute distress.    Appearance: Normal appearance. She is not toxic-appearing.  HENT:     Head: Normocephalic.     Right Ear: Tympanic membrane, ear canal and external ear normal.     Left Ear: Tympanic membrane, ear canal and external ear normal.     Nose: Nose normal.     Mouth/Throat:     Pharynx: Oropharynx is clear.  Eyes:     Extraocular Movements: Extraocular movements intact.     Conjunctiva/sclera: Conjunctivae normal.     Pupils: Pupils are equal, round, and reactive to light.     Comments: glasses  Cardiovascular:     Rate and Rhythm: Normal rate and regular rhythm.     Pulses: Normal pulses.  Pulmonary:     Effort: Pulmonary effort is normal.     Breath sounds: Normal breath  sounds. No wheezing, rhonchi or rales.  Chest:     Breasts:        Right: Normal. No swelling, bleeding, inverted nipple, mass, nipple discharge, skin change or tenderness.        Left: Normal. No swelling, bleeding, inverted nipple, mass, nipple discharge, skin change or tenderness.  Abdominal:     General: Bowel sounds are normal. There is no distension.     Palpations: Abdomen is soft. There is no mass.      Tenderness: There is no abdominal tenderness. There is no guarding or rebound.  Musculoskeletal:        General: Normal range of motion.     Cervical back: Normal range of motion and neck supple.  Lymphadenopathy:     Cervical: No cervical adenopathy.     Upper Body:     Right upper body: No supraclavicular, axillary or pectoral adenopathy.     Left upper body: No supraclavicular, axillary or pectoral adenopathy.  Skin:    General: Skin is warm and dry.     Capillary Refill: Capillary refill takes less than 2 seconds.     Comments: Slight erythema on top lip, red flaky spots on arms and legs  Neurological:     General: No focal deficit present.     Mental Status: She is alert and oriented to person, place, and time. Mental status is at baseline.     Cranial Nerves: No cranial nerve deficit.     Sensory: No sensory deficit.     Motor: No weakness.     Coordination: Coordination normal.     Gait: Gait normal.     Deep Tendon Reflexes: Reflexes normal.  Psychiatric:        Mood and Affect: Mood normal.        Behavior: Behavior normal.        Thought Content: Thought content normal.        Judgment: Judgment normal.     Labs reviewed: Basic Metabolic Panel: Recent Labs    06/10/18 0400 07/23/18 0000 10/14/18 0000  NA 138 134* 136*  K 4.9 4.5 4.7  BUN 18 23* 23*  CREATININE 0.7 1.1 1.0   Liver Function Tests: Recent Labs    04/01/18 0000 06/10/18 0400 07/23/18 0000  AST 20 20 20   ALT 12 16 18   ALKPHOS 38 31 28   No results for input(s): LIPASE, AMYLASE in the last 8760 hours. No results for input(s): AMMONIA in the last 8760 hours. CBC: Recent Labs    03/27/18 0000 06/10/18 0400 07/23/18 0000  WBC 7.8 6.6 10.9  NEUTROABS 5  --  8  HGB 8.5* 11.4* 11.4*  HCT 26* 33* 34*  PLT 307 219 206   Cardiac Enzymes: No results for input(s): CKTOTAL, CKMB, CKMBINDEX, TROPONINI in the last 8760 hours. BNP: Invalid input(s): POCBNP Lab Results  Component Value  Date   HGBA1C 5.4 09/23/2017   Lab Results  Component Value Date   TSH 1.48 12/06/2017   Lab Results  Component Value Date   [id number] 06/10/2018   Lab Results  Component Value Date   FOLATE 9.1 12/06/2017   Lab Results  Component Value Date   IRON 119 06/10/2018   TIBC 250 03/27/2018   FERRITIN 608 (H) 12/06/2017   Reviewed readings of bone density studies with her today that did show some slight improvement 2017 to 2019.  Assessment/Plan 1. Annual physical exam -performed today  2. Polymyalgia rheumatica (HCC) -continues on gradual  steroid taper--on 7mg  at present  -being weaned by Dr. Trudie Karia Ehresman  3. Senile osteoporosis -cont prolia injections but to get through pharmacy benefit and receive thru [facility] -continue vitamin D3 supplement and walking for weightbearing exercise  4. Senile purpura ([facility]) -noted some of arms--puts concealer on them  5. Dry eyes -use saline from ophtho as directed, RARE use of artificial tears should not be harmful  6. Rectocele -recommended f/u with her gyn to see if she could try a pessary again at this point b/c prior attempts, they did not fit her well, but it was 10 years ago by her recall  Labs/tests ordered:  No new Next appt:  04/15/2019  Yovany Clock L. Tajon Moring, D.O. [facility] [address], [place] [address] Cell Phone (Mon-Fri 8am-5pm):  [phone] On Call:  [phone] & follow prompts after 5pm & weekends Office Phone:  [phone] Office Fax:  [phone]

## 2018-12-11 DIAGNOSIS — D692 Other nonthrombocytopenic purpura: Secondary | ICD-10-CM | POA: Insufficient documentation

## 2018-12-11 DIAGNOSIS — H04123 Dry eye syndrome of bilateral lacrimal glands: Secondary | ICD-10-CM | POA: Insufficient documentation

## 2018-12-16 ENCOUNTER — Telehealth: Payer: Self-pay | Admitting: Internal Medicine

## 2018-12-16 DIAGNOSIS — M150 Primary generalized (osteo)arthritis: Secondary | ICD-10-CM | POA: Diagnosis not present

## 2018-12-16 DIAGNOSIS — M48061 Spinal stenosis, lumbar region without neurogenic claudication: Secondary | ICD-10-CM | POA: Diagnosis not present

## 2018-12-16 DIAGNOSIS — M2550 Pain in unspecified joint: Secondary | ICD-10-CM | POA: Diagnosis not present

## 2018-12-16 DIAGNOSIS — M353 Polymyalgia rheumatica: Secondary | ICD-10-CM | POA: Diagnosis not present

## 2018-12-16 DIAGNOSIS — Z7952 Long term (current) use of systemic steroids: Secondary | ICD-10-CM | POA: Diagnosis not present

## 2018-12-16 DIAGNOSIS — K52831 Collagenous colitis: Secondary | ICD-10-CM | POA: Diagnosis not present

## 2018-12-16 DIAGNOSIS — Z1589 Genetic susceptibility to other disease: Secondary | ICD-10-CM | POA: Diagnosis not present

## 2018-12-17 NOTE — Telephone Encounter (Signed)
Patient reports that she has been off Budesonide since March.  The loose stool returned again a few weeks ago.  After stopping the budesonide in March she had diffuse joint pains.  She saw rheumatology and they started her on prednisone. She was on 15 mg and over the last several months she has been weaned down to 6 mg.  In January she will go down to 5 mg.rheumatology is okay with her resuming budesonide if you feel that is necessary.  Please advise

## 2018-12-17 NOTE — Telephone Encounter (Signed)
Patient notified she wants to call back to schedule follow up .  She has budesonide at home and will take those until she comes in for follow up

## 2018-12-17 NOTE — Telephone Encounter (Signed)
Saw Dr. Trudie Reed' note also  Plan: 1) restart budesonide at 9 mg daily 2) Return visit me next available in Jan/Feb 3) Stay on budesnoide at that dose until she sees me  4) Call back sooner prn

## 2018-12-19 DIAGNOSIS — D485 Neoplasm of uncertain behavior of skin: Secondary | ICD-10-CM | POA: Diagnosis not present

## 2018-12-19 DIAGNOSIS — C44722 Squamous cell carcinoma of skin of right lower limb, including hip: Secondary | ICD-10-CM | POA: Diagnosis not present

## 2018-12-19 DIAGNOSIS — B0089 Other herpesviral infection: Secondary | ICD-10-CM | POA: Diagnosis not present

## 2018-12-19 DIAGNOSIS — L570 Actinic keratosis: Secondary | ICD-10-CM | POA: Diagnosis not present

## 2018-12-19 DIAGNOSIS — R58 Hemorrhage, not elsewhere classified: Secondary | ICD-10-CM | POA: Diagnosis not present

## 2018-12-19 DIAGNOSIS — Z85828 Personal history of other malignant neoplasm of skin: Secondary | ICD-10-CM | POA: Diagnosis not present

## 2018-12-29 ENCOUNTER — Telehealth: Payer: Self-pay | Admitting: Internal Medicine

## 2018-12-29 MED ORDER — FAMOTIDINE 40 MG PO TABS: 40.0000 mg | ORAL_TABLET | Freq: Two times a day (BID) | ORAL | 2 refills | Status: DC

## 2018-12-29 NOTE — Telephone Encounter (Signed)
Patient reports that her stools are now normal and and has been since about the 21st.  She is still taking the 9 mg of budesonide.  When can she start to taper?

## 2018-12-29 NOTE — Telephone Encounter (Signed)
Patient notified of budesonide taper and rx for pepcid sent for 90 day supply

## 2018-12-29 NOTE — Telephone Encounter (Signed)
AT 4 WEEKS OR 1 MONTH SHE CAN GO TO 6 MG

## 2018-12-30 ENCOUNTER — Encounter: Payer: Self-pay | Admitting: Nurse Practitioner

## 2018-12-30 ENCOUNTER — Ambulatory Visit (INDEPENDENT_AMBULATORY_CARE_PROVIDER_SITE_OTHER): Payer: PPO | Admitting: Nurse Practitioner

## 2018-12-30 ENCOUNTER — Other Ambulatory Visit: Payer: Self-pay

## 2018-12-30 VITALS — BP 120/60 | Ht 62.0 in | Wt 132.0 lb

## 2018-12-30 DIAGNOSIS — Z0000 Encounter for general adult medical examination without abnormal findings: Secondary | ICD-10-CM

## 2018-12-30 NOTE — Progress Notes (Signed)
Subjective:   Beth Wise is a 85 y.o. female who presents for Medicare Annual (Subsequent) preventive examination.  Review of Systems:   Cardiac Risk Factors include: hypertension;dyslipidemia;advanced age (>73men, >48 women)     Objective:     Vitals: BP 120/60   Ht 5\' 2"  (1.575 m)   Wt 132 lb (59.9 kg)   BMI 24.14 kg/m   Body mass index is 24.14 kg/m.  Advanced Directives 12/30/2018 12/30/2018 10/22/2018 03/12/2018 12/06/2017 11/27/2016 11/07/2016  Does Patient Have a Medical Advance Directive? Yes Yes Yes Yes Yes Yes Yes  Type of Advance Directive Out of facility DNR (pink MOST or yellow form) Out of facility DNR (pink MOST or yellow form) [place];Out of facility DNR (pink MOST or yellow form) [place];Living will;Out of facility DNR (pink MOST or yellow form) [place];Living will [place];Out of facility DNR (pink MOST or yellow form) [place];Living will  Does patient want to make changes to medical advance directive? No - Patient declined No - Patient declined No - Patient declined No - Patient declined - No - Patient declined -  Copy of [place] in Chart? - - Yes - validated most recent copy scanned in chart (See row information) Yes - validated most recent copy scanned in chart (See row information) Yes - validated most recent copy scanned in chart (See row information) Yes No - copy requested  Pre-existing out of facility DNR order (yellow form or pink MOST form) Pink MOST/Yellow Form most recent copy in chart - Physician notified to receive inpatient order - Yellow form placed in chart (order not valid for inpatient use);Pink MOST form placed in chart (order not valid for inpatient use) Yellow form placed in chart (order not valid for inpatient use);Pink MOST form placed in chart (order not valid for inpatient use) - Pink MOST form placed in chart (order not  valid for inpatient use);Yellow form placed in chart (order not valid for inpatient use) Pink MOST form placed in chart (order not valid for inpatient use)    Tobacco Social History   Tobacco Use  Smoking Status Never Smoker  Smokeless Tobacco Never Used     Counseling given: Not Answered   Clinical Intake:  Pre-visit preparation completed: Yes  Pain : No/denies pain     BMI - recorded: 24.14 Nutritional Status: BMI of 19-24  Normal Diabetes: No  How often do you need to have someone help you when you read instructions, pamphlets, or other written materials from your doctor or pharmacy?: 1 - Never What is the last grade level you completed in school?: 12th grade        Past Medical History:  Diagnosis Date  . Allergy   . Arthritis   . Back pain   . Blepharitis   . Cancer ([place])    basal cell on face  . Candidal esophagitis ([place]) 2010  . Cataract   . Chest pain, atypical   . Collagenous colitis    Dr. Maurene Capes  . Diverticulosis   . Fuchs' corneal dystrophy   . GERD (gastroesophageal reflux disease)   . Heart murmur   . Hiatal hernia 2010  . History of fibromyalgia   . Hypercalcemia   . Hyperlipidemia   . Hypertension   . IBS (irritable bowel syndrome)   . Insomnia   . Osteoporosis   . Primary hyperparathyroidism ([place]) 03/11/2017  . Rectocele   .  Scoliosis   . Shingles 1975   at waistline, no chronic pain.    Marland Kitchen Spinal stenosis   . Vaginal prolapse    Past Surgical History:  Procedure Laterality Date  . ABDOMINAL HYSTERECTOMY    . BREAST BIOPSY  1964   left benign  . CARDIOVASCULAR STRESS TEST  12/03/2007   EF 72%  . CATARACT EXTRACTION W/ INTRAOCULAR LENS  IMPLANT, BILATERAL    . COLONOSCOPY  04/24/2006   diverticulosis  . FLEXIBLE SIGMOIDOSCOPY  07/27/2010   diverticulosis, collagenous colitis  . TONSILLECTOMY AND ADENOIDECTOMY    . UPPER GASTROINTESTINAL ENDOSCOPY  01/29/2008   esophagitis, hiatal hernia, gastritis  . US ECHOCARDIOGRAPHY   10/30/2007    EF 55-60%   Family History  Problem Relation Age of Onset  . Breast cancer Mother        died at 78  . Stroke Mother   . Heart disease Father        died at 27  . Cirrhosis Sister        died at 50  . Heart disease Brother        died at 33  . Pneumonia Sister        died at 14 months  . Heart disease Sister        died at 41  . Cancer - Other Sister        stomach; died at 83  . Stomach cancer Sister   . Heart disease Brother        c-diff and sepsis; died at 2  . Colon cancer Neg Hx   . Diabetes Neg Hx   . Rectal cancer Neg Hx   . Esophageal cancer Neg Hx    Social History   Socioeconomic History  . Marital status: Widowed    Spouse name: Not on file  . Number of children: 2  . Years of education: Not on file  . Highest education level: Not on file  Occupational History  . Occupation: retired    Comment: owned Human resources officer  Tobacco Use  . Smoking status: Never Smoker  . Smokeless tobacco: Never Used  Substance and Sexual Activity  . Alcohol use: Yes    Comment: rarely  . Drug use: No  . Sexual activity: Never  Other Topics Concern  . Not on file  Social History Narrative   Social History      Diet? Regular (shouldn't have excessive amts of acid, hot &spicey foods)      Do you drink/eat things with caffeine? yes      Marital status?       widow                             What year were you married? [place] you live in a house, apartment, assisted living, condo, trailer, etc.? apartment      Is it one or more stories? 1      How many persons live in your home? 1 (self)      Do you have any pets in your home? (please list) no no !!      Highest level of education completed?      Current or past profession: hairdresser      Do you exercise?      little  Type & how often? Leg lifts and plank, stretching every day.      Advanced Directives      Do you have a living will?  yes      Do you  have a DNR form?         yes                         If not, do you want to discuss one? yes      Do you have signed POA/HPOA for forms? yes      Functional Status      Do you have difficulty bathing or dressing yourself?      Do you have difficulty preparing food or eating?       Do you have difficulty managing your medications?      Do you have difficulty managing your finances?      Do you have difficulty affording your medications?   Social Determinants of Health   Financial Resource Strain:   . Difficulty of Paying Living Expenses: Not on file  Food Insecurity:   . Worried About Charity fundraiser in the Last Year: Not on file  . Ran Out of Food in the Last Year: Not on file  Transportation Needs:   . Lack of Transportation (Medical): Not on file  . Lack of Transportation (Non-Medical): Not on file  Physical Activity:   . Days of Exercise per Week: Not on file  . Minutes of Exercise per Session: Not on file  Stress:   . Feeling of Stress : Not on file  Social Connections:   . Frequency of Communication with Friends and Family: Not on file  . Frequency of Social Gatherings with Friends and Family: Not on file  . Attends Religious Services: Not on file  . Active Member of Clubs or Organizations: Not on file  . Attends Archivist Meetings: Not on file  . Marital Status: Not on file    Outpatient Encounter Medications as of 12/30/2018  Medication Sig  . acetaminophen (TYLENOL) 500 MG tablet Take 500 mg by mouth 3 (three) times daily as needed.  . Cholecalciferol (VITAMIN D) 125 MCG (5000 UT) CAPS Take 1 tablet by mouth 3 times weekly  . cyanocobalamin (,VITAMIN B-12,) 1000 MCG/ML injection Inject 1 mL (1,000 mcg total) into the muscle every 30 (thirty) days.  Derrill Memo ON 03/25/2019] denosumab (PROLIA) 60 MG/ML SOSY injection Inject 60 mg into the skin every 6 (six) months.  Marland Kitchen estradiol (ESTRACE) 0.1 MG/GM vaginal cream Place 1 Applicatorful vaginally at  bedtime.  . famotidine (PEPCID) 40 MG tablet Take 1 tablet (40 mg total) by mouth 2 (two) times daily.  . flurazepam (DALMANE) 15 MG capsule Take 1 capsule (15 mg total) by mouth at bedtime as needed for sleep.  . GuaiFENesin (MUCINEX PO) Take 1 tablet by mouth every 12 (twelve) hours as needed (congestion).   . hydrocortisone 2.5 % cream Apply topically 2 (two) times daily.  Marland Kitchen ipratropium (ATROVENT) 0.03 % nasal spray Place 2 sprays into both nostrils 2 (two) times daily as needed for rhinitis. As directed nasal spray  . Melatonin 5 MG TABS Take 1 tablet by mouth at bedtime as needed (Takes an additional as needed when wakes up).   . Multiple Vitamin (MULTIVITAMIN) tablet Take 1 tablet by mouth daily.  . predniSONE (DELTASONE) 1 MG tablet Take 1 mg by mouth daily with breakfast.   .  predniSONE (DELTASONE) 5 MG tablet Take 5 mg by mouth daily. Along with a 1 mg  . Soft Lens Products (SALINE SENSITIVE EYES) SOLN Place 1 drop into both eyes 3 (three) times daily.  . [DISCONTINUED] Misc Natural Products (TART CHERRY ADVANCED PO) Take 1 tablet by mouth daily.    No facility-administered encounter medications on file as of 12/30/2018.    Activities of Daily Living In your present state of health, do you have any difficulty performing the following activities: 12/30/2018  Hearing? N  Vision? N  Difficulty concentrating or making decisions? Y  Walking or climbing stairs? Y  Dressing or bathing? N  Doing errands, shopping? Y  Comment [facility] help transport her  Preparing Food and eating ? N  Using the Toilet? N  In the past six months, have you accidently leaked urine? Y  Do you have problems with loss of bowel control? Y  Managing your Medications? N  Managing your Finances? N  Housekeeping or managing your Housekeeping? N  Some recent data might be hidden    Patient Care Team: Gayland Curry, DO as PCP - General (Geriatric Medicine) Rolm Bookbinder, MD as Consulting Physician  (Dermatology) Marti Sleigh, MD as Consulting Physician (Gynecology) Stanford Breed, Denice Bors, MD as Consulting Physician (Cardiology) Luberta Mutter, MD as Consulting Physician (Ophthalmology) Jola Baptist, [place] as Referring Physician (Chiropractic Medicine) Neomia Dear (Chiropractic Medicine) Starling Manns, MD (Orthopedic Surgery) Gatha Mayer, MD as Consulting Physician (Gastroenterology)    Assessment:   This is a routine wellness examination for Kaisa.  Exercise Activities and Dietary recommendations Current Exercise Habits: The patient does not participate in regular exercise at present  Goals    . DIET - INCREASE WATER INTAKE     Patient will use 3 thermoses to drink more water.    . Patient Stated     Stay active.        Fall Risk Fall Risk  12/30/2018 12/10/2018 10/22/2018 06/18/2018 03/12/2018  Falls in the past year? 0 0 0 0 0  Number falls in past yr: 0 0 0 0 0  Injury with Fall? 0 - 0 0 0  Risk for fall due to : - - - - -  Follow up - - - - -   Is the patient's home free of loose throw rugs in walkways, pet beds, electrical cords, etc?   yes      Grab bars in the bathroom? yes      Handrails on the stairs?   no stairs      Adequate lighting?   yes  Timed Get Up and Go performed: na  Depression Screen PHQ 2/9 Scores 12/30/2018 10/22/2018 06/18/2018 03/12/2018  PHQ - 2 Score 0 0 0 0  PHQ- 9 Score - - - -     Cognitive Function MMSE - Mini Mental State Exam 12/10/2018 12/06/2017 11/27/2016  Not completed: (No Data) - -  Orientation to time 5 5 5   Orientation to Place 5 5 5   Registration 3 3 3   Attention/ Calculation 5 5 5   Recall 3 2 2   Language- name 2 objects 2 2 2   Language- repeat 1 1 1   Language- follow 3 step command 3 3 3   Language- read & follow direction 1 1 1   Write a sentence 1 1 1   Copy design 1 1 1   Total score 30 29 29      6CIT Screen 12/30/2018  What Year? 0 points  What month? 0 points  What time? 0 points  Count back from  20 0 points  Months in reverse 0 points  Repeat phrase 0 points  Total Score 0    Immunization History  Administered Date(s) Administered  . Influenza, High Dose Seasonal PF 09/23/2017, 10/10/2018  . Influenza-Unspecified 09/01/2015, 10/22/2016  . Pneumococcal Conjugate-13 11/07/2016  . Pneumococcal-Unspecified 01/02/2015  . Td 01/01/2010  . Zoster Recombinat (Shingrix) 02/14/2017, 04/17/2017    Qualifies for Shingles Vaccine?completed  Screening Tests Health Maintenance  Topic Date Due  . DEXA SCAN  09/10/2019  . MAMMOGRAM  09/15/2019  . TETANUS/TDAP  01/02/2020  . INFLUENZA VACCINE  Completed  . PNA vac Low Risk Adult  Completed    Cancer Screenings: Lung: Low Dose CT Chest recommended if Age 36-80 years, 30 pack-year currently smoking OR have quit w/in 15years. Patient does not qualify. Breast:  Up to date on Mammogram? Aged out- still getting mammogram Up to date of Bone Density/Dexa? Yes Colorectal: aged out  Additional Screenings: na Hepatitis C Screening:      Plan:      I have personally reviewed and noted the following in the patient's chart:   . Medical and social history . Use of alcohol, tobacco or illicit drugs  . Current medications and supplements . Functional ability and status . Nutritional status . Physical activity . Advanced directives . List of other physicians . Hospitalizations, surgeries, and ER visits in previous 12 months . Vitals . Screenings to include cognitive, depression, and falls . Referrals and appointments  In addition, I have reviewed and discussed with patient certain preventive protocols, quality metrics, and best practice recommendations. A written personalized care plan for preventive services as well as general preventive health recommendations were provided to patient.     Lauree Chandler, NP  12/30/2018

## 2018-12-30 NOTE — Patient Instructions (Signed)
Beth Wise , Thank you for taking time to come for your Medicare Wellness Visit. I appreciate your ongoing commitment to your health goals. Please review the following plan we discussed and let me know if I can assist you in the future.   Screening recommendations/referrals: Colonoscopy aged out Mammogram up to date Bone Density up to date Recommended yearly ophthalmology/optometry visit for glaucoma screening and checkup Recommended yearly dental visit for hygiene and checkup  Vaccinations: Influenza vaccine up to date Pneumococcal vaccine up to date Tdap vaccine up to date Shingles vaccine up to date    Advanced directives: on file  Conditions/risks identified: none.   Next appointment: 1 year.    Preventive Care 7 Years and Older, Female Preventive care refers to lifestyle choices and visits with your health care provider that can promote health and wellness. What does preventive care include?  A yearly physical exam. This is also called an annual well check.  Dental exams once or twice a year.  Routine eye exams. Ask your health care provider how often you should have your eyes checked.  Personal lifestyle choices, including:  Daily care of your teeth and gums.  Regular physical activity.  Eating a healthy diet.  Avoiding tobacco and drug use.  Limiting alcohol use.  Practicing safe sex.  Taking low-dose aspirin every day.  Taking vitamin and mineral supplements as recommended by your health care provider. What happens during an annual well check? The services and screenings done by your health care provider during your annual well check will depend on your age, overall health, lifestyle risk factors, and family history of disease. Counseling  Your health care provider may ask you questions about your:  Alcohol use.  Tobacco use.  Drug use.  Emotional well-being.  Home and relationship well-being.  Sexual activity.  Eating habits.  History of  falls.  Memory and ability to understand (cognition).  Work and work Statistician.  Reproductive health. Screening  You may have the following tests or measurements:  Height, weight, and BMI.  Blood pressure.  Lipid and cholesterol levels. These may be checked every 5 years, or more frequently if you are over 71 years old.  Skin check.  Lung cancer screening. You may have this screening every year starting at age 72 if you have a 30-pack-year history of smoking and currently smoke or have quit within the past 15 years.  Fecal occult blood test (FOBT) of the stool. You may have this test every year starting at age 83.  Flexible sigmoidoscopy or colonoscopy. You may have a sigmoidoscopy every 5 years or a colonoscopy every 10 years starting at age 20.  Hepatitis C blood test.  Hepatitis B blood test.  Sexually transmitted disease (STD) testing.  Diabetes screening. This is done by checking your blood sugar (glucose) after you have not eaten for a while (fasting). You may have this done every 1-3 years.  Bone density scan. This is done to screen for osteoporosis. You may have this done starting at age 52.  Mammogram. This may be done every 1-2 years. Talk to your health care provider about how often you should have regular mammograms. Talk with your health care provider about your test results, treatment options, and if necessary, the need for more tests. Vaccines  Your health care provider may recommend certain vaccines, such as:  Influenza vaccine. This is recommended every year.  Tetanus, diphtheria, and acellular pertussis (Tdap, Td) vaccine. You may need a Td booster every 10 years.  Zoster vaccine. You may need this after age 50.  Pneumococcal 13-valent conjugate (PCV13) vaccine. One dose is recommended after age 37.  Pneumococcal polysaccharide (PPSV23) vaccine. One dose is recommended after age 29. Talk to your health care provider about which screenings and  vaccines you need and how often you need them. This information is not intended to replace advice given to you by your health care provider. Make sure you discuss any questions you have with your health care provider. Document Released: 01/14/2015 Document Revised: 09/07/2015 Document Reviewed: 10/19/2014 Elsevier Interactive Patient Education  2017 [place] Prevention in the Home Falls can cause injuries. They can happen to people of all ages. There are many things you can do to make your home safe and to help prevent falls. What can I do on the outside of my home?  Regularly fix the edges of walkways and driveways and fix any cracks.  Remove anything that might make you trip as you walk through a door, such as a raised step or threshold.  Trim any bushes or trees on the path to your home.  Use bright outdoor lighting.  Clear any walking paths of anything that might make someone trip, such as rocks or tools.  Regularly check to see if handrails are loose or broken. Make sure that both sides of any steps have handrails.  Any raised decks and porches should have guardrails on the edges.  Have any leaves, snow, or ice cleared regularly.  Use sand or salt on walking paths during winter.  Clean up any spills in your garage right away. This includes oil or grease spills. What can I do in the bathroom?  Use night lights.  Install grab bars by the toilet and in the tub and shower. Do not use towel bars as grab bars.  Use non-skid mats or decals in the tub or shower.  If you need to sit down in the shower, use a plastic, non-slip stool.  Keep the floor dry. Clean up any water that spills on the floor as soon as it happens.  Remove soap buildup in the tub or shower regularly.  Attach bath mats securely with double-sided non-slip rug tape.  Do not have throw rugs and other things on the floor that can make you trip. What can I do in the bedroom?  Use night  lights.  Make sure that you have a light by your bed that is easy to reach.  Do not use any sheets or blankets that are too big for your bed. They should not hang down onto the floor.  Have a firm chair that has side arms. You can use this for support while you get dressed.  Do not have throw rugs and other things on the floor that can make you trip. What can I do in the kitchen?  Clean up any spills right away.  Avoid walking on wet floors.  Keep items that you use a lot in easy-to-reach places.  If you need to reach something above you, use a strong step stool that has a grab bar.  Keep electrical cords out of the way.  Do not use floor polish or wax that makes floors slippery. If you must use wax, use non-skid floor wax.  Do not have throw rugs and other things on the floor that can make you trip. What can I do with my stairs?  Do not leave any items on the stairs.  Make sure that there are  handrails on both sides of the stairs and use them. Fix handrails that are broken or loose. Make sure that handrails are as long as the stairways.  Check any carpeting to make sure that it is firmly attached to the stairs. Fix any carpet that is loose or worn.  Avoid having throw rugs at the top or bottom of the stairs. If you do have throw rugs, attach them to the floor with carpet tape.  Make sure that you have a light switch at the top of the stairs and the bottom of the stairs. If you do not have them, ask someone to add them for you. What else can I do to help prevent falls?  Wear shoes that:  Do not have high heels.  Have rubber bottoms.  Are comfortable and fit you well.  Are closed at the toe. Do not wear sandals.  If you use a stepladder:  Make sure that it is fully opened. Do not climb a closed stepladder.  Make sure that both sides of the stepladder are locked into place.  Ask someone to hold it for you, if possible.  Clearly mark and make sure that you can  see:  Any grab bars or handrails.  First and last steps.  Where the edge of each step is.  Use tools that help you move around (mobility aids) if they are needed. These include:  Canes.  Walkers.  Scooters.  Crutches.  Turn on the lights when you go into a dark area. Replace any light bulbs as soon as they burn out.  Set up your furniture so you have a clear path. Avoid moving your furniture around.  If any of your floors are uneven, fix them.  If there are any pets around you, be aware of where they are.  Review your medicines with your doctor. Some medicines can make you feel dizzy. This can increase your chance of falling. Ask your doctor what other things that you can do to help prevent falls. This information is not intended to replace advice given to you by your health care provider. Make sure you discuss any questions you have with your health care provider. Document Released: 10/14/2008 Document Revised: 05/26/2015 Document Reviewed: 01/22/2014 Elsevier Interactive Patient Education  2017 [employer].

## 2018-12-30 NOTE — Progress Notes (Signed)
Patient ID: Beth Wise, female   DOB: [birth year], 85 y.o.   MRN: [id number]  This service is provided via telemedicine  No vital signs collected/recorded due to the encounter was a telemedicine visit.   Location of patient (ex: home, work): Home  Patient consents to a telephone visit:  Yes  Location of the provider (ex: office, home): [facility]  Name of any referring provider:  Dr. Hollace Kinnier  Names of all persons participating in the telemedicine service and their role in the encounter:  Patient, [name], RMA, Sherrie Mustache, NP.  Time spent on call:  13:13

## 2018-12-30 NOTE — Telephone Encounter (Signed)
Patient notified of the recommendations .  All questions answered

## 2018-12-31 ENCOUNTER — Other Ambulatory Visit: Payer: Self-pay

## 2018-12-31 MED ORDER — BUDESONIDE 3 MG PO CPEP: 9.0000 mg | ORAL_CAPSULE | Freq: Every day | ORAL | 3 refills | Status: DC

## 2018-12-31 NOTE — Telephone Encounter (Signed)
We received a fax from [facility], fax # [phone], phone # [phone] for a refill on patient's budesonide. The form was filled out signed and faxed to them. Patient informed that this has been done.

## 2019-01-01 DIAGNOSIS — D692 Other nonthrombocytopenic purpura: Secondary | ICD-10-CM | POA: Diagnosis not present

## 2019-01-01 DIAGNOSIS — L570 Actinic keratosis: Secondary | ICD-10-CM | POA: Diagnosis not present

## 2019-01-01 DIAGNOSIS — Z85828 Personal history of other malignant neoplasm of skin: Secondary | ICD-10-CM | POA: Diagnosis not present

## 2019-01-07 ENCOUNTER — Non-Acute Institutional Stay: Payer: PPO | Admitting: Internal Medicine

## 2019-01-07 ENCOUNTER — Encounter: Payer: Self-pay | Admitting: Internal Medicine

## 2019-01-07 ENCOUNTER — Other Ambulatory Visit: Payer: Self-pay

## 2019-01-07 VITALS — BP 118/60 | HR 86 | Temp 97.1°F | Ht 60.0 in | Wt 134.0 lb

## 2019-01-07 DIAGNOSIS — Z7189 Other specified counseling: Secondary | ICD-10-CM | POA: Diagnosis not present

## 2019-01-07 DIAGNOSIS — F5101 Primary insomnia: Secondary | ICD-10-CM | POA: Diagnosis not present

## 2019-01-07 NOTE — Progress Notes (Signed)
Location:  Occupational psychologist of Service:  Clinic (12)  Provider: Gaby Harney L. Mariea Clonts, D.O., C.M.D.  Code Status: DNR, reviewed and discussed MOST form today--she requests no changes--updated back of form and copied for scanning  Goals of Care:  Advanced Directives 01/07/2019  Does Patient Have a Medical Advance Directive? No  Type of Advance Directive -  Does patient want to make changes to medical advance directive? -  Copy of [place] in Chart? -  Pre-existing out of facility DNR order (yellow form or pink MOST form) -     Chief Complaint  Patient presents with  . Medication Management    Pain contract signed, wants to talk about most form     HPI: Patient is a 86 y.o. female seen today for medical management of chronic diseases.    MOST form reviewed:  She does not want to change anything on it:  DNR, comfort care unless comfort needs cannot be met here at [facility] rehab, no tube feeding, no antibiotics and IVFs for defined time only.  We discussed that that time is typically 1-2 days to determine if someone will turn around and then it's stopped b/c the body does not tolerate fluids longer at the end of life.  Fluids do not typically bring comfort as is often thought.  We also printed out and had her sign the non-opioid controlled substance contract for her dalmane benzo she rarely uses for sleep.  Gets 90 and lasts for months and months.    Past Medical History:  Diagnosis Date  . Allergy   . Arthritis   . Back pain   . Blepharitis   . Cancer ([place])    basal cell on face  . Candidal esophagitis ([place]) 2010  . Cataract   . Chest pain, atypical   . Collagenous colitis    Dr. Maurene Capes  . Diverticulosis   . Fuchs' corneal dystrophy   . GERD (gastroesophageal reflux disease)   . Heart murmur   . Hiatal hernia 2010  . History of fibromyalgia   . Hypercalcemia   . Hyperlipidemia   . Hypertension   . IBS (irritable bowel syndrome)   .  Insomnia   . Osteoporosis   . Primary hyperparathyroidism ([place]) 03/11/2017  . Rectocele   . Scoliosis   . Shingles 1975   at waistline, no chronic pain.    Marland Kitchen Spinal stenosis   . Vaginal prolapse     Past Surgical History:  Procedure Laterality Date  . ABDOMINAL HYSTERECTOMY    . BREAST BIOPSY  1964   left benign  . CARDIOVASCULAR STRESS TEST  12/03/2007   EF 72%  . CATARACT EXTRACTION W/ INTRAOCULAR LENS  IMPLANT, BILATERAL    . COLONOSCOPY  04/24/2006   diverticulosis  . FLEXIBLE SIGMOIDOSCOPY  07/27/2010   diverticulosis, collagenous colitis  . TONSILLECTOMY AND ADENOIDECTOMY    . UPPER GASTROINTESTINAL ENDOSCOPY  01/29/2008   esophagitis, hiatal hernia, gastritis  . US ECHOCARDIOGRAPHY  10/30/2007    EF 55-60%    Allergies  Allergen Reactions  . Actonel [Risedronate Sodium] Other (See Comments)    Joint pain  . Amoxicillin Diarrhea  . Erythromycin Other (See Comments)    Gi upset  . Lipitor [Atorvastatin Calcium]   . Zetia [Ezetimibe]     Joint pain    Outpatient Encounter Medications as of 01/07/2019  Medication Sig  . acetaminophen (TYLENOL) 500 MG tablet Take 500 mg by mouth 3 (three) times  daily as needed.  . budesonide (ENTOCORT EC) 3 MG 24 hr capsule Take 3 capsules (9 mg total) by mouth daily.  . Cholecalciferol (VITAMIN D) 125 MCG (5000 UT) CAPS Take 1 tablet by mouth 3 times weekly  . cyanocobalamin (,VITAMIN B-12,) 1000 MCG/ML injection Inject 1 mL (1,000 mcg total) into the muscle every 30 (thirty) days.  Derrill Memo ON 03/25/2019] denosumab (PROLIA) 60 MG/ML SOSY injection Inject 60 mg into the skin every 6 (six) months.  Marland Kitchen estradiol (ESTRACE) 0.1 MG/GM vaginal cream Place 1 Applicatorful vaginally at bedtime.  . famotidine (PEPCID) 40 MG tablet Take 1 tablet (40 mg total) by mouth 2 (two) times daily.  . flurazepam (DALMANE) 15 MG capsule Take 1 capsule (15 mg total) by mouth at bedtime as needed for sleep.  . GuaiFENesin (MUCINEX PO) Take 1 tablet by mouth  every 12 (twelve) hours as needed (congestion).   . hydrocortisone 2.5 % cream Apply topically 2 (two) times daily.  Marland Kitchen ipratropium (ATROVENT) 0.03 % nasal spray Place 2 sprays into both nostrils 2 (two) times daily as needed for rhinitis. As directed nasal spray  . Melatonin 5 MG TABS Take 1 tablet by mouth at bedtime as needed (Takes an additional as needed when wakes up).   . Multiple Vitamin (MULTIVITAMIN) tablet Take 1 tablet by mouth daily.  . predniSONE (DELTASONE) 1 MG tablet Take 1 mg by mouth daily with breakfast.   . predniSONE (DELTASONE) 5 MG tablet Take 5 mg by mouth daily. Along with a 1 mg  . Soft Lens Products (SALINE SENSITIVE EYES) SOLN Place 1 drop into both eyes 3 (three) times daily.   No facility-administered encounter medications on file as of 01/07/2019.    Review of Systems:  Review of Systems  Constitutional: Negative for chills, fever and malaise/fatigue.  Eyes: Negative for blurred vision.  Respiratory: Negative for cough and shortness of breath.   Cardiovascular: Negative for chest pain, palpitations and leg swelling.  Gastrointestinal: Negative for abdominal pain, blood in stool, constipation, diarrhea, heartburn, melena, nausea and vomiting.  Genitourinary: Negative for dysuria.  Musculoskeletal: Positive for joint pain. Negative for falls and myalgias.  Skin: Negative for itching and rash.  Neurological: Negative for dizziness and loss of consciousness.  Endo/Heme/Allergies: Positive for environmental allergies. Bruises/bleeds easily.  Psychiatric/Behavioral: Negative for depression and memory loss. The patient is nervous/anxious and has insomnia.     Health Maintenance  Topic Date Due  . DEXA SCAN  09/10/2019  . MAMMOGRAM  09/15/2019  . TETANUS/TDAP  01/02/2020  . INFLUENZA VACCINE  Completed  . PNA vac Low Risk Adult  Completed    Physical Exam: Vitals:   01/07/19 1358  BP: 118/60  Pulse: 86  Temp: (!) 97.1 F (36.2 C)  TempSrc: Temporal    SpO2: 99%  Weight: 134 lb (60.8 kg)  Height: 5' (1.524 m)   Body mass index is 26.17 kg/m. Physical Exam Vitals reviewed.  Constitutional:      Appearance: Normal appearance.  HENT:     Head: Normocephalic and atraumatic.  Eyes:     Comments: glasses  Cardiovascular:     Rate and Rhythm: Normal rate and regular rhythm.     Pulses: Normal pulses.     Heart sounds: Normal heart sounds.  Pulmonary:     Effort: Pulmonary effort is normal. No respiratory distress.     Breath sounds: Normal breath sounds.  Abdominal:     General: Bowel sounds are normal.  Musculoskeletal:  General: Normal range of motion.     Right lower leg: No edema.     Left lower leg: No edema.  Skin:    General: Skin is warm and dry.     Capillary Refill: Capillary refill takes less than 2 seconds.  Neurological:     General: No focal deficit present.     Mental Status: She is alert and oriented to person, place, and time. Mental status is at baseline.  Psychiatric:        Mood and Affect: Mood normal.        Behavior: Behavior normal.        Thought Content: Thought content normal.        Judgment: Judgment normal.     Labs reviewed: Basic Metabolic Panel: Recent Labs    06/10/18 0400 07/23/18 0000 10/14/18 0000  NA 138 134* 136*  K 4.9 4.5 4.7  BUN 18 23* 23*  CREATININE 0.7 1.1 1.0   Liver Function Tests: Recent Labs    04/01/18 0000 06/10/18 0400 07/23/18 0000  AST 20 20 20   ALT 12 16 18   ALKPHOS 38 31 28   No results for input(s): LIPASE, AMYLASE in the last 8760 hours. No results for input(s): AMMONIA in the last 8760 hours. CBC: Recent Labs    03/27/18 0000 06/10/18 0400 07/23/18 0000  WBC 7.8 6.6 10.9  NEUTROABS 5  --  8  HGB 8.5* 11.4* 11.4*  HCT 26* 33* 34*  PLT 307 219 206   Lipid Panel: No results for input(s): CHOL, HDL, LDLCALC, TRIG, CHOLHDL, LDLDIRECT in the last 8760 hours. Lab Results  Component Value Date   HGBA1C 5.4 09/23/2017     Assessment/Plan 1. ACP (advance care planning) -MOST form reviewed and she requested no changes at this time -she seemed to be questioning whether she would even want a trial of fluids end of life, but opted to leave the option the same at this time -copy made of updated form and sent to scan in vynca -17 mins spent on ACP  2. Primary insomnia -continues on her dalmane that she's used for years--does not take each and every night, but as needed--understands risks of chronic benzos -signed non-opioid controlled substance agreement  Next appt:  Keep April routine appt  Savier Trickett L. Terriann Difonzo, D.O. [facility] [address], [place] [address] Cell Phone (Mon-Fri 8am-5pm):  [phone] On Call:  [phone] & follow prompts after 5pm & weekends Office Phone:  [phone] Office Fax:  [phone]

## 2019-01-14 DIAGNOSIS — N816 Rectocele: Secondary | ICD-10-CM | POA: Diagnosis not present

## 2019-01-14 DIAGNOSIS — Z4689 Encounter for fitting and adjustment of other specified devices: Secondary | ICD-10-CM | POA: Diagnosis not present

## 2019-01-14 DIAGNOSIS — N952 Postmenopausal atrophic vaginitis: Secondary | ICD-10-CM | POA: Diagnosis not present

## 2019-01-14 DIAGNOSIS — K469 Unspecified abdominal hernia without obstruction or gangrene: Secondary | ICD-10-CM | POA: Diagnosis not present

## 2019-01-26 ENCOUNTER — Ambulatory Visit: Payer: PPO | Admitting: Internal Medicine

## 2019-01-26 ENCOUNTER — Encounter: Payer: Self-pay | Admitting: Internal Medicine

## 2019-01-26 VITALS — Ht 62.0 in | Wt 133.0 lb

## 2019-01-26 DIAGNOSIS — K52831 Collagenous colitis: Secondary | ICD-10-CM

## 2019-01-26 DIAGNOSIS — M353 Polymyalgia rheumatica: Secondary | ICD-10-CM | POA: Diagnosis not present

## 2019-01-26 NOTE — Progress Notes (Signed)
Beth Wise 86 y.o. Nov 01, 1925 [id number]  Assessment & Plan:   Collagenous colitis She is the type of patient that apparently needs episodic treatment for her collagenous colitis.  Budesonide clearly works better based upon the literature and in her.  She will stop it as no taper is needed.  She knows to call me if she has a flare again but I have advised her if she has diarrhea for several days in a row it is reasonable to start it if she is unable to contact me.  I will see her in follow-up as needed.  PMR (polymyalgia rheumatica) (HCC) Doing well.  Tapering prednisone is fine with me.  Plans per Dr. Trudie Reed   Cc: Gavin Pound MD Gayland Curry, DO  Subjective:   Chief Complaint: Follow-up of collagenous colitis  HPI Beth Wise is here today for follow-up of her collagenous colitis which flared late last year again.  She was started on budesonide 9 mg daily and had a rapid response to where she was much better days or not more than a week or 2 in.  She continues to feel well and she has reduced to 6 mg daily.  Her polymyalgia rheumatica remains in good shape she feels so much better and is grateful to Dr. Trudie Reed for her care.  She has been slowly tapering on prednisone and she reports that Dr. Trudie Reed wants to know if I am okay with reducing prednisone from 5 to 4 mg. Allergies  Allergen Reactions  . Actonel [Risedronate Sodium] Other (See Comments)    Joint pain  . Amoxicillin Diarrhea  . Erythromycin Other (See Comments)    Gi upset  . Lipitor [Atorvastatin Calcium]   . Zetia [Ezetimibe]     Joint pain   Current Meds  Medication Sig  . acetaminophen (TYLENOL) 500 MG tablet Take 500 mg by mouth 3 (three) times daily as needed.  . budesonide (ENTOCORT EC) 3 MG 24 hr capsule Take 3 capsules (9 mg total) by mouth daily.  . Cholecalciferol (VITAMIN D) 125 MCG (5000 UT) CAPS Take 1 tablet by mouth 3 times weekly  . cyanocobalamin (,VITAMIN B-12,) 1000 MCG/ML injection Inject  1 mL (1,000 mcg total) into the muscle every 30 (thirty) days.  Derrill Memo ON 03/25/2019] denosumab (PROLIA) 60 MG/ML SOSY injection Inject 60 mg into the skin every 6 (six) months.  Marland Kitchen estradiol (ESTRACE) 0.1 MG/GM vaginal cream Place 1 Applicatorful vaginally at bedtime.  . famotidine (PEPCID) 40 MG tablet Take 1 tablet (40 mg total) by mouth 2 (two) times daily.  . flurazepam (DALMANE) 15 MG capsule Take 1 capsule (15 mg total) by mouth at bedtime as needed for sleep.  . GuaiFENesin (MUCINEX PO) Take 1 tablet by mouth every 12 (twelve) hours as needed (congestion).   . hydrocortisone 2.5 % cream Apply topically 2 (two) times daily.  Marland Kitchen ipratropium (ATROVENT) 0.03 % nasal spray Place 2 sprays into both nostrils 2 (two) times daily as needed for rhinitis. As directed nasal spray  . Melatonin 5 MG TABS Take 1 tablet by mouth at bedtime as needed (Takes an additional as needed when wakes up).   . Multiple Vitamin (MULTIVITAMIN) tablet Take 1 tablet by mouth daily.  . predniSONE (DELTASONE) 1 MG tablet Take 1 mg by mouth daily with breakfast.   . predniSONE (DELTASONE) 5 MG tablet Take 5 mg by mouth daily. Along with a 1 mg  . Soft Lens Products (SALINE SENSITIVE EYES) SOLN Place 1 drop into  both eyes 3 (three) times daily.   Past Medical History:  Diagnosis Date  . Allergy   . Arthritis   . Back pain   . Blepharitis   . Cancer ([place])    basal cell on face  . Candidal esophagitis ([place]) 2010  . Cataract   . Chest pain, atypical   . Collagenous colitis    Dr. Maurene Capes  . Diverticulosis   . Fuchs' corneal dystrophy   . GERD (gastroesophageal reflux disease)   . Heart murmur   . Hiatal hernia 2010  . History of fibromyalgia   . Hypercalcemia   . Hyperlipidemia   . Hypertension   . IBS (irritable bowel syndrome)   . Insomnia   . Osteoporosis   . Primary hyperparathyroidism ([place]) 03/11/2017  . Rectocele   . Scoliosis   . Shingles 1975   at waistline, no chronic pain.    Marland Kitchen Spinal stenosis     . Vaginal prolapse    Past Surgical History:  Procedure Laterality Date  . ABDOMINAL HYSTERECTOMY    . BREAST BIOPSY  1964   left benign  . CARDIOVASCULAR STRESS TEST  12/03/2007   EF 72%  . CATARACT EXTRACTION W/ INTRAOCULAR LENS  IMPLANT, BILATERAL    . COLONOSCOPY  04/24/2006   diverticulosis  . FLEXIBLE SIGMOIDOSCOPY  07/27/2010   diverticulosis, collagenous colitis  . TONSILLECTOMY AND ADENOIDECTOMY    . UPPER GASTROINTESTINAL ENDOSCOPY  01/29/2008   esophagitis, hiatal hernia, gastritis  . US ECHOCARDIOGRAPHY  10/30/2007    EF 55-60%   Social History   Social History Narrative   Social History      Diet? Regular (shouldn't have excessive amts of acid, hot &spicey foods)      Do you drink/eat things with caffeine? yes      Marital status?       widow                             What year were you married? [place] you live in a house, apartment, assisted living, condo, trailer, etc.? apartment      Is it one or more stories? 1      How many persons live in your home? 1 (self)      Do you have any pets in your home? (please list) no no !!      Highest level of education completed?      Current or past profession: hairdresser      Do you exercise?      little                                Type & how often? Leg lifts and plank, stretching every day.      Advanced Directives      Do you have a living will?  yes      Do you have a DNR form?         yes                         If not, do you want to discuss one? yes      Do you have signed POA/HPOA for forms? yes      Functional Status      Do you have difficulty bathing or dressing yourself?  Do you have difficulty preparing food or eating?       Do you have difficulty managing your medications?      Do you have difficulty managing your finances?      Do you have difficulty affording your medications?   family history includes Breast cancer in her mother; Cancer - Other in her sister;  Cirrhosis in her sister; Heart disease in her brother, brother, father, and sister; Pneumonia in her sister; Stomach cancer in her sister; Stroke in her mother.   Review of Systems As above Objective:   Physical Exam Ht 5\' 2"  (1.575 m)   Wt 133 lb (60.3 kg)   BMI 24.33 kg/m  No acute distress Vibrant energetic elderly white woman looking younger than stated age

## 2019-01-26 NOTE — Patient Instructions (Signed)
Glad you are doing better.  Please stop the budesonide.  If you get diarrhea for more than 3 days in a row restart budesonide at 9 mg daily and let me know.  It is fine to reduce prednisone to 4 mg daily.  I will let her know as well.  I appreciate the opportunity to care for you. Gatha Mayer, MD, [facility]

## 2019-01-27 NOTE — Assessment & Plan Note (Signed)
She is the type of patient that apparently needs episodic treatment for her collagenous colitis.  Budesonide clearly works better based upon the literature and in her.  She will stop it as no taper is needed.  She knows to call me if she has a flare again but I have advised her if she has diarrhea for several days in a row it is reasonable to start it if she is unable to contact me.  I will see her in follow-up as needed.

## 2019-01-27 NOTE — Assessment & Plan Note (Signed)
Doing well.  Tapering prednisone is fine with me.  Plans per Dr. Trudie Reed

## 2019-02-04 ENCOUNTER — Other Ambulatory Visit: Payer: Self-pay | Admitting: *Deleted

## 2019-02-04 DIAGNOSIS — E538 Deficiency of other specified B group vitamins: Secondary | ICD-10-CM

## 2019-02-04 MED ORDER — CYANOCOBALAMIN 1000 MCG/ML IJ SOLN: 1000.0000 ug | INTRAMUSCULAR | 11 refills | Status: DC

## 2019-02-25 DIAGNOSIS — N952 Postmenopausal atrophic vaginitis: Secondary | ICD-10-CM | POA: Diagnosis not present

## 2019-02-25 DIAGNOSIS — N816 Rectocele: Secondary | ICD-10-CM | POA: Diagnosis not present

## 2019-02-25 DIAGNOSIS — K469 Unspecified abdominal hernia without obstruction or gangrene: Secondary | ICD-10-CM | POA: Diagnosis not present

## 2019-03-03 ENCOUNTER — Other Ambulatory Visit: Payer: Self-pay | Admitting: Internal Medicine

## 2019-03-03 DIAGNOSIS — M810 Age-related osteoporosis without current pathological fracture: Secondary | ICD-10-CM

## 2019-03-03 MED ORDER — DENOSUMAB 60 MG/ML ~~LOC~~ SOSY: 60.0000 mg | PREFILLED_SYRINGE | SUBCUTANEOUS | 0 refills | Status: DC

## 2019-03-05 ENCOUNTER — Telehealth: Payer: Self-pay | Admitting: Internal Medicine

## 2019-03-05 NOTE — Telephone Encounter (Signed)
Spoke with the patient who reported she is having a flare and started back on the Budesonide. I confirmed with the patient that this in fact is what she should have done as referenced in Dr. Celesta Aver last note from her 01/27/19 office visit. She declined follow up at this time.

## 2019-03-11 NOTE — Progress Notes (Signed)
HPI: FU hypertension.She has a past history of labile hypertensionandatypical chest pain. Adenosine Cardiolite December 2009normal.  Echocardiogram July 2019 showed normal LV function, grade 1 diastolic dysfunction, mild aortic insufficiency and mitral regurgitation.  [place] seen 4/19 the patient denies any dyspnea on exertion, orthopnea, PND, pedal edema, palpitations, syncope or chest pain.  Current Outpatient Medications  Medication Sig Dispense Refill  . acetaminophen (TYLENOL) 500 MG tablet Take 500 mg by mouth 3 (three) times daily as needed.    . Cholecalciferol (VITAMIN D) 125 MCG (5000 UT) CAPS Take 1 tablet by mouth 3 times weekly    . cyanocobalamin (,VITAMIN B-12,) 1000 MCG/ML injection Inject 1 mL (1,000 mcg total) into the muscle every 30 (thirty) days. 1 mL 11  . [START ON 03/25/2019] denosumab (PROLIA) 60 MG/ML SOSY injection Inject 60 mg into the skin every 6 (six) months. 180 mL 0  . estradiol (ESTRACE) 0.1 MG/GM vaginal cream Place 1 Applicatorful vaginally at bedtime.    . famotidine (PEPCID) 40 MG tablet Take 1 tablet (40 mg total) by mouth 2 (two) times daily. 180 tablet 2  . flurazepam (DALMANE) 15 MG capsule Take 1 capsule (15 mg total) by mouth at bedtime as needed for sleep. 90 capsule 1  . GuaiFENesin (MUCINEX PO) Take 1 tablet by mouth every 12 (twelve) hours as needed (congestion).     . hydrocortisone 2.5 % cream Apply topically 2 (two) times daily. 30 g 1  . ipratropium (ATROVENT) 0.03 % nasal spray Place 2 sprays into both nostrils 2 (two) times daily as needed for rhinitis. As directed nasal spray 30 mL 1  . Melatonin 5 MG TABS Take 1 tablet by mouth at bedtime as needed (Takes an additional as needed when wakes up).     . Multiple Vitamin (MULTIVITAMIN) tablet Take 1 tablet by mouth daily.    Marland Kitchen PREDNISONE PO Take 3 mg by mouth daily.     . Soft Lens Products (SALINE SENSITIVE EYES) SOLN Place 1 drop into both eyes 3 (three) times daily.     No  current facility-administered medications for this visit.     Past Medical History:  Diagnosis Date  . Allergy   . Arthritis   . Back pain   . Blepharitis   . Cancer ([place])    basal cell on face  . Candidal esophagitis ([place]) 2010  . Cataract   . Chest pain, atypical   . Collagenous colitis    Dr. Maurene Capes  . Diverticulosis   . Fuchs' corneal dystrophy   . GERD (gastroesophageal reflux disease)   . Heart murmur   . Hiatal hernia 2010  . History of fibromyalgia   . Hypercalcemia   . Hyperlipidemia   . Hypertension   . IBS (irritable bowel syndrome)   . Insomnia   . Osteoporosis   . Primary hyperparathyroidism ([place]) 03/11/2017  . Rectocele   . Scoliosis   . Shingles 1975   at waistline, no chronic pain.    Marland Kitchen Spinal stenosis   . Vaginal prolapse     Past Surgical History:  Procedure Laterality Date  . ABDOMINAL HYSTERECTOMY    . BREAST BIOPSY  1964   left benign  . CARDIOVASCULAR STRESS TEST  12/03/2007   EF 72%  . CATARACT EXTRACTION W/ INTRAOCULAR LENS  IMPLANT, BILATERAL    . COLONOSCOPY  04/24/2006   diverticulosis  . FLEXIBLE SIGMOIDOSCOPY  07/27/2010   diverticulosis, collagenous colitis  . TONSILLECTOMY AND ADENOIDECTOMY    .  UPPER GASTROINTESTINAL ENDOSCOPY  01/29/2008   esophagitis, hiatal hernia, gastritis  . US ECHOCARDIOGRAPHY  10/30/2007    EF 55-60%    Social History   Socioeconomic History  . Marital status: Widowed    Spouse name: Not on file  . Number of children: 2  . Years of education: Not on file  . Highest education level: Not on file  Occupational History  . Occupation: retired    Comment: owned Human resources officer  Tobacco Use  . Smoking status: Never Smoker  . Smokeless tobacco: Never Used  Substance and Sexual Activity  . Alcohol use: Yes    Comment: rarely  . Drug use: No  . Sexual activity: Never  Other Topics Concern  . Not on file  Social History Narrative   Social History      Diet? Regular (shouldn't have excessive amts of  acid, hot &spicey foods)      Do you drink/eat things with caffeine? yes      Marital status?       widow                             What year were you married? [place] you live in a house, apartment, assisted living, condo, trailer, etc.? apartment      Is it one or more stories? 1      How many persons live in your home? 1 (self)      Do you have any pets in your home? (please list) no no !!      Highest level of education completed?      Current or past profession: hairdresser      Do you exercise?      little                                Type & how often? Leg lifts and plank, stretching every day.      Advanced Directives      Do you have a living will?  yes      Do you have a DNR form?         yes                         If not, do you want to discuss one? yes      Do you have signed POA/HPOA for forms? yes      Functional Status      Do you have difficulty bathing or dressing yourself?      Do you have difficulty preparing food or eating?       Do you have difficulty managing your medications?      Do you have difficulty managing your finances?      Do you have difficulty affording your medications?   Social Determinants of Health   Financial Resource Strain:   . Difficulty of Paying Living Expenses:   Food Insecurity:   . Worried About Charity fundraiser in the Last Year:   . Arboriculturist in the Last Year:   Transportation Needs:   . Film/video editor (Medical):   Marland Kitchen Lack of Transportation (Non-Medical):   Physical Activity:   . Days of Exercise per Week:   . Minutes of Exercise per Session:   Stress:   .  Feeling of Stress :   Social Connections:   . Frequency of Communication with Friends and Family:   . Frequency of Social Gatherings with Friends and Family:   . Attends Religious Services:   . Active Member of Clubs or Organizations:   . Attends Archivist Meetings:   Marland Kitchen Marital Status:   Intimate Partner Violence:    . Fear of Current or Ex-Partner:   . Emotionally Abused:   Marland Kitchen Physically Abused:   . Sexually Abused:     Family History  Problem Relation Age of Onset  . Breast cancer Mother        died at 31  . Stroke Mother   . Heart disease Father        died at 24  . Cirrhosis Sister        died at 71  . Heart disease Brother        died at 62  . Pneumonia Sister        died at 59 months  . Heart disease Sister        died at 9  . Cancer - Other Sister        stomach; died at 42  . Stomach cancer Sister   . Heart disease Brother        c-diff and sepsis; died at 39  . Colon cancer Neg Hx   . Diabetes Neg Hx   . Rectal cancer Neg Hx   . Esophageal cancer Neg Hx     ROS: no fevers or chills, productive cough, hemoptysis, dysphasia, odynophagia, melena, hematochezia, dysuria, hematuria, rash, seizure activity, orthopnea, PND, pedal edema, claudication. Remaining systems are negative.  Physical Exam: Well-developed well-nourished in no acute distress.  Skin is warm and dry.  HEENT is normal.  Neck is supple.  Chest is clear to auscultation with normal expansion.  Cardiovascular exam is regular rate and rhythm.  Abdominal exam nontender or distended. No masses palpated. Extremities show no edema. neuro grossly intact  ECG-normal sinus rhythm at a rate of 69, no ST changes.  Personally reviewed  A/P  1 H/O hypertension-blood pressure normal on no meds. Continue to follow.  2 aortic insufficiency/mitral regurgitation-mild on most recent echo.  3 hyperlipidemia-Per primary care.  Kirk Ruths, MD

## 2019-03-23 ENCOUNTER — Encounter: Payer: Self-pay | Admitting: Cardiology

## 2019-03-23 ENCOUNTER — Ambulatory Visit: Payer: PPO | Admitting: Cardiology

## 2019-03-23 ENCOUNTER — Other Ambulatory Visit: Payer: Self-pay

## 2019-03-23 VITALS — BP 118/68 | HR 69 | Ht 62.5 in | Wt 132.2 lb

## 2019-03-23 DIAGNOSIS — I359 Nonrheumatic aortic valve disorder, unspecified: Secondary | ICD-10-CM | POA: Diagnosis not present

## 2019-03-23 DIAGNOSIS — E7800 Pure hypercholesterolemia, unspecified: Secondary | ICD-10-CM

## 2019-03-23 DIAGNOSIS — I10 Essential (primary) hypertension: Secondary | ICD-10-CM

## 2019-03-23 NOTE — Patient Instructions (Signed)

## 2019-03-24 DIAGNOSIS — M2550 Pain in unspecified joint: Secondary | ICD-10-CM | POA: Diagnosis not present

## 2019-03-24 DIAGNOSIS — Z1589 Genetic susceptibility to other disease: Secondary | ICD-10-CM | POA: Diagnosis not present

## 2019-03-24 DIAGNOSIS — M150 Primary generalized (osteo)arthritis: Secondary | ICD-10-CM | POA: Diagnosis not present

## 2019-03-24 DIAGNOSIS — M48061 Spinal stenosis, lumbar region without neurogenic claudication: Secondary | ICD-10-CM | POA: Diagnosis not present

## 2019-03-24 DIAGNOSIS — M353 Polymyalgia rheumatica: Secondary | ICD-10-CM | POA: Diagnosis not present

## 2019-03-24 DIAGNOSIS — K52831 Collagenous colitis: Secondary | ICD-10-CM | POA: Diagnosis not present

## 2019-03-24 DIAGNOSIS — Z6823 Body mass index (BMI) 23.0-23.9, adult: Secondary | ICD-10-CM | POA: Diagnosis not present

## 2019-03-25 ENCOUNTER — Telehealth: Payer: Self-pay

## 2019-03-25 ENCOUNTER — Ambulatory Visit: Payer: Self-pay

## 2019-03-25 NOTE — Telephone Encounter (Signed)
Let's have her get her b12 level rechecked before she gets her next b12 injection.  She can come at 8am on tues or thurs at [facility] for the lab test, but we will need to communicate which date to [facility] or Nira Conn when she decides.

## 2019-03-25 NOTE — Telephone Encounter (Signed)
Patient feels that her B12 shots are not lasting her all month. She states that "walking back [place] [employer] getting her [place] today"she was so weak and had trouble getting back to her room. She saw her cardiologist recently and got a good report, so she feels it is the B12 that is causing her to feel weak. She is getting her Prolio from [place] tomorrow and would like to get a B12 injection at that time.   Last B12 level was done 06/10/18 and was 655, prior to that, on 02/10/18, level was 343.

## 2019-03-26 NOTE — Telephone Encounter (Signed)
Called the patient and told her Dr. Mariea Clonts wanted her to get a B12 level before deciding on increasing frequency of B12 injections.  Dee sent order to [facility] for a blood draw to be done on Tuesday, 03/31/19, and patient was notified.

## 2019-04-02 DIAGNOSIS — L570 Actinic keratosis: Secondary | ICD-10-CM | POA: Diagnosis not present

## 2019-04-02 DIAGNOSIS — Z85828 Personal history of other malignant neoplasm of skin: Secondary | ICD-10-CM | POA: Diagnosis not present

## 2019-04-02 DIAGNOSIS — L821 Other seborrheic keratosis: Secondary | ICD-10-CM | POA: Diagnosis not present

## 2019-04-06 ENCOUNTER — Telehealth: Payer: Self-pay | Admitting: Internal Medicine

## 2019-04-06 ENCOUNTER — Encounter: Payer: Self-pay | Admitting: Internal Medicine

## 2019-04-06 ENCOUNTER — Other Ambulatory Visit: Payer: Self-pay

## 2019-04-06 ENCOUNTER — Ambulatory Visit (INDEPENDENT_AMBULATORY_CARE_PROVIDER_SITE_OTHER): Payer: PPO | Admitting: Internal Medicine

## 2019-04-06 VITALS — BP 118/62 | HR 75 | Temp 97.8°F | Ht 62.5 in | Wt 132.0 lb

## 2019-04-06 DIAGNOSIS — J301 Allergic rhinitis due to pollen: Secondary | ICD-10-CM | POA: Diagnosis not present

## 2019-04-06 DIAGNOSIS — E538 Deficiency of other specified B group vitamins: Secondary | ICD-10-CM

## 2019-04-06 DIAGNOSIS — H6122 Impacted cerumen, left ear: Secondary | ICD-10-CM

## 2019-04-06 DIAGNOSIS — N816 Rectocele: Secondary | ICD-10-CM

## 2019-04-06 MED ORDER — IPRATROPIUM BROMIDE 0.03 % NA SOLN: 2.0000 | Freq: Two times a day (BID) | NASAL | 1 refills | Status: DC | PRN

## 2019-04-06 MED ORDER — CYANOCOBALAMIN 1000 MCG/ML IJ SOLN: 1000.0000 ug | INTRAMUSCULAR | 3 refills | Status: DC

## 2019-04-06 NOTE — Telephone Encounter (Signed)
Requesting referral for ENT, pt states ears are dry & itchy. She feels they may swollen or irritated. She thinks she may have a sore inside her ear(left)  Thanks,  [name].

## 2019-04-06 NOTE — Progress Notes (Addendum)
Location:  [facility] clinic Provider: Tedi Hughson L. Mariea Clonts, D.O., C.M.D.  Code Status: DNR Goals of Care:  Advanced Directives 04/06/2019  Does Patient Have a Medical Advance Directive? Yes  Type of Advance Directive Out of facility DNR (pink MOST or yellow form)  Does patient want to make changes to medical advance directive? No - Patient declined  Copy of [place] in Chart? -  Pre-existing out of facility DNR order (yellow form or pink MOST form) -   Chief Complaint  Patient presents with  . Acute Visit    Ears are dry and itchy    HPI: Patient is a 86 y.o. female seen today for an acute visit for dry and itchy ears.  She thinks she irritated them herself b/c they itch.  She put a q tip in Saturday--blood came out.  Thinks there was a scab and it came off.     She is having difficulty with her allergic rhinitis and wants her ipratropium spray.  She has her rectocele.  Some days it's so good she doesn't worry, but this year, she's had to see Dr. Zigmund Daniel for itching, burning and irritation.  She is considering if she should have "her vagina sewed up".    She got a perfect report from cardiology.  BP at goal there and here.    Wants to discuss her B12.  She says 1-2 weeks before the next injection and just after getting it, she will feel less peppy.  She started a brain supplement instead of getting the lab test.  Wants to know if she can take the b12 injections every 2 wks instead of monthly.  Feels like she "has not hit bottom".    Past Medical History:  Diagnosis Date  . Allergy   . Arthritis   . Back pain   . Blepharitis   . Cancer ([place])    basal cell on face  . Candidal esophagitis ([place]) 2010  . Cataract   . Chest pain, atypical   . Collagenous colitis    Dr. Maurene Capes  . Diverticulosis   . Fuchs' corneal dystrophy   . GERD (gastroesophageal reflux disease)   . Heart murmur   . Hiatal hernia 2010  . History of fibromyalgia   . Hypercalcemia   .  Hyperlipidemia   . Hypertension   . IBS (irritable bowel syndrome)   . Insomnia   . Osteoporosis   . Primary hyperparathyroidism ([place]) 03/11/2017  . Rectocele   . Scoliosis   . Shingles 1975   at waistline, no chronic pain.    Marland Kitchen Spinal stenosis   . Vaginal prolapse     Past Surgical History:  Procedure Laterality Date  . ABDOMINAL HYSTERECTOMY    . BREAST BIOPSY  1964   left benign  . CARDIOVASCULAR STRESS TEST  12/03/2007   EF 72%  . CATARACT EXTRACTION W/ INTRAOCULAR LENS  IMPLANT, BILATERAL    . COLONOSCOPY  04/24/2006   diverticulosis  . FLEXIBLE SIGMOIDOSCOPY  07/27/2010   diverticulosis, collagenous colitis  . TONSILLECTOMY AND ADENOIDECTOMY    . UPPER GASTROINTESTINAL ENDOSCOPY  01/29/2008   esophagitis, hiatal hernia, gastritis  . US ECHOCARDIOGRAPHY  10/30/2007    EF 55-60%    Allergies  Allergen Reactions  . Actonel [Risedronate Sodium] Other (See Comments)    Joint pain  . Amoxicillin Diarrhea  . Erythromycin Other (See Comments)    Gi upset  . Lipitor [Atorvastatin Calcium]   . Zetia [Ezetimibe]  Joint pain    Outpatient Encounter Medications as of 04/06/2019  Medication Sig  . acetaminophen (TYLENOL) 500 MG tablet Take 500 mg by mouth 3 (three) times daily as needed.  . Cholecalciferol (VITAMIN D) 125 MCG (5000 UT) CAPS Take 1 tablet by mouth 3 times weekly  . cyanocobalamin (,VITAMIN B-12,) 1000 MCG/ML injection Inject 1 mL (1,000 mcg total) into the muscle every 30 (thirty) days.  Marland Kitchen denosumab (PROLIA) 60 MG/ML SOSY injection Inject 60 mg into the skin every 6 (six) months.  Marland Kitchen estradiol (ESTRACE) 0.1 MG/GM vaginal cream Place 1 Applicatorful vaginally at bedtime.  . famotidine (PEPCID) 40 MG tablet Take 1 tablet (40 mg total) by mouth 2 (two) times daily.  . flurazepam (DALMANE) 15 MG capsule Take 1 capsule (15 mg total) by mouth at bedtime as needed for sleep.  . GuaiFENesin (MUCINEX PO) Take 1 tablet by mouth every 12 (twelve) hours as needed  (congestion).   . hydrocortisone 2.5 % cream Apply topically 2 (two) times daily.  Marland Kitchen ipratropium (ATROVENT) 0.03 % nasal spray Place 2 sprays into both nostrils 2 (two) times daily as needed for rhinitis. As directed nasal spray  . Melatonin 5 MG TABS Take 1 tablet by mouth at bedtime as needed (Takes an additional as needed when wakes up).   . Multiple Vitamin (MULTIVITAMIN) tablet Take 1 tablet by mouth daily.  Marland Kitchen PREDNISONE PO Take 3 mg by mouth daily.   . Soft Lens Products (SALINE SENSITIVE EYES) SOLN Place 1 drop into both eyes 3 (three) times daily.   No facility-administered encounter medications on file as of 04/06/2019.    Review of Systems:  Review of Systems  Constitutional: Negative for chills and fever.  HENT: Positive for ear pain and hearing loss. Negative for congestion.        Ear pressure and sensation like ears cannot pop, fullness  Eyes: Negative for blurred vision.  Respiratory: Negative for cough and shortness of breath.   Cardiovascular: Negative for chest pain, palpitations and leg swelling.  Gastrointestinal: Negative for abdominal pain, constipation, diarrhea, heartburn, nausea and vomiting.  Genitourinary:       Irritation of rectocele  Musculoskeletal: Positive for back pain. Negative for falls.  Neurological: Negative for dizziness and loss of consciousness.  Endo/Heme/Allergies: Bruises/bleeds easily.  Psychiatric/Behavioral: Positive for memory loss. Negative for depression. The patient is not nervous/anxious and does not have insomnia.     Health Maintenance  Topic Date Due  . INFLUENZA VACCINE  08/02/2019  . DEXA SCAN  09/10/2019  . MAMMOGRAM  09/15/2019  . TETANUS/TDAP  01/02/2020  . PNA vac Low Risk Adult  Completed    Physical Exam: Vitals:   04/06/19 1509  BP: 118/62  Pulse: 75  Temp: 97.8 F (36.6 C)  TempSrc: Temporal  SpO2: 98%  Weight: 132 lb (59.9 kg)  Height: 5' 2.5" (1.588 m)   Body mass index is 23.76 kg/m. Physical  Exam Vitals reviewed.  Constitutional:      Appearance: Normal appearance.  HENT:     Right Ear: Tympanic membrane, ear canal and external ear normal. There is no impacted cerumen.     Ears:     Comments: Left ear with erythema of canal, dullness of tm with some fluid behind it after bloody cerumen, skin and eschar removed from ear Cardiovascular:     Rate and Rhythm: Normal rate and regular rhythm.  Pulmonary:     Effort: Pulmonary effort is normal.     Breath sounds:  Normal breath sounds.  Abdominal:     General: Bowel sounds are normal.  Musculoskeletal:        General: Normal range of motion.     Right lower leg: No edema.     Left lower leg: No edema.  Skin:    General: Skin is warm and dry.  Neurological:     General: No focal deficit present.     Mental Status: She is alert and oriented to person, place, and time.  Psychiatric:        Mood and Affect: Mood normal.        Behavior: Behavior normal.        Thought Content: Thought content normal.        Judgment: Judgment normal.     Labs reviewed: Basic Metabolic Panel: Recent Labs    06/10/18 0400 07/23/18 0000 10/14/18 0000  NA 138 134* 136*  K 4.9 4.5 4.7  BUN 18 23* 23*  CREATININE 0.7 1.1 1.0   Liver Function Tests: Recent Labs    06/10/18 0400 07/23/18 0000  AST 20 20  ALT 16 18  ALKPHOS 31 28   No results for input(s): LIPASE, AMYLASE in the last 8760 hours. No results for input(s): AMMONIA in the last 8760 hours. CBC: Recent Labs    06/10/18 0400 07/23/18 0000  WBC 6.6 10.9  NEUTROABS  --  8  HGB 11.4* 11.4*  HCT 33* 34*  PLT 219 206   Lipid Panel: No results for input(s): CHOL, HDL, LDLCALC, TRIG, CHOLHDL, LDLDIRECT in the last 8760 hours. Lab Results  Component Value Date   HGBA1C 5.4 09/23/2017    Assessment/Plan 1. Hearing loss of left ear due to cerumen impaction -also had dry skin, cerumen and scab in ear that were removed today using a plastic curette with some  improvement -advised to monitor hearing, pressure sensation and discomfort in ear--if not improving by thurs afternoon, call me back for antibiotic drops (suspect she traumatized her ear with the qtip)  2. Seasonal allergic rhinitis due to pollen - renewed atrovent at her request and also using bid mucinex - ipratropium (ATROVENT) 0.03 % nasal spray; Place 2 sprays into both nostrils 2 (two) times daily as needed for rhinitis. As directed nasal spray  Dispense: 90 mL; Refill: 1  3. Rectocele -more bothersome lately with irritation and feeling like she's sitting on it like when she  Goes to get into bed and plops herself down with her feet up -is considering colpocleisis/posterior vaginal wall repair under spinal anesthesia and asked me what I thought today -advised that if it's bothering her quality of life, she should pursue it -she is not bothered daily so she is still going to think on this -cardiology visit was just great with Dr. Stanford Breed  4. Vitamin B12 deficiency - wants to get b12 q 2 wks, but we don't know if her levels require this b/c she didn't get the level done due to starting on oral supplement with b vitamins in it  -she agrees to stop that now and then get her level checked one week before her next injection with the [place] clinic nurse and we will determine if biweekly injections are indicated - cyanocobalamin (,VITAMIN B-12,) 1000 MCG/ML injection; Inject 1 mL (1,000 mcg total) into the muscle every 30 (thirty) days.  Dispense: 3 mL; Refill: 3  Labs/tests ordered:  b12 level at [facility] the week before her b12 injection Next appt:  Move appt with me to July  Barney Russomanno L. Martinez Boxx, D.O. [facility] [address], [place] [address] Cell Phone (Mon-Fri 8am-5pm):  [phone] On Call:  [phone] & follow prompts after 5pm & weekends Office Phone:  [phone] Office Fax:  [phone]

## 2019-04-06 NOTE — Patient Instructions (Addendum)
Stop your brain supplement. The week before your next B12 injection, we will check your B12 level to determine if you should get an injection every 2 weeks instead of monthly. Call us back if your left ear is not feeling better by Thursday afternoon.

## 2019-04-06 NOTE — Telephone Encounter (Signed)
I can place the referral, but would prefer that we see her first.  She may not need the specialist for this.  Looks like this Wednesday clinic is full and she is already on schedule 4/14 at [facility].  I'm sure one of the NPs could see her here though if she's willing to come over.

## 2019-04-09 ENCOUNTER — Ambulatory Visit: Payer: PPO | Admitting: Internal Medicine

## 2019-04-15 ENCOUNTER — Encounter: Payer: Self-pay | Admitting: Internal Medicine

## 2019-04-22 ENCOUNTER — Telehealth: Payer: Self-pay

## 2019-04-22 NOTE — Telephone Encounter (Signed)
We will have her come in tomorrow morning here to get her B12 drawn.  When I get the results, we will see if she can get the B12 injection done by the clinic nurse early.

## 2019-04-22 NOTE — Telephone Encounter (Addendum)
Patient states she is weak and short of breath and doesn't think she will be able to go get her lunch or her mail if she doesn't go ahead and get her B12 injection, instead of waiting until 05/05/19.  She feels like she needs to get an injection q2wks. She states she had her last injection on 4/1. She was supposed to get a blood draw done on 03/31/19 to check her B12 level, but it wasn't done.   From 03/25/19 - "Let's have her get her b12 level rechecked before she gets her next b12 injection.  She can come at 8am on tues or thurs at [facility] clinic for the lab test, but we will need to communicate which date to [facility] or Nira Conn when she decides."

## 2019-04-22 NOTE — Telephone Encounter (Signed)
Order for B12 blood draw put in for 04/23/19, patient has been called with her appointment date.

## 2019-04-23 DIAGNOSIS — D519 Vitamin B12 deficiency anemia, unspecified: Secondary | ICD-10-CM | POA: Diagnosis not present

## 2019-04-23 DIAGNOSIS — E538 Deficiency of other specified B group vitamins: Secondary | ICD-10-CM | POA: Diagnosis not present

## 2019-04-23 DIAGNOSIS — D649 Anemia, unspecified: Secondary | ICD-10-CM | POA: Diagnosis not present

## 2019-04-23 LAB — VITAMIN B12: Vitamin B-12: 1045

## 2019-04-28 ENCOUNTER — Telehealth: Payer: Self-pay

## 2019-04-28 NOTE — Telephone Encounter (Signed)
Filled out lab slip for AM cortisol and Iron panel to give to nurse at [place] tomorrow 04/29/19 to have lab drawn on 04/30/19

## 2019-04-28 NOTE — Telephone Encounter (Signed)
Patient would like to know if there are any other tests that can be done to help understand why she feels so fatigue and SOB. She has been on Prednisone for long term, currently 1 mg, started last Friday.  Her B12 level was okay but wonders could it be her iron again, since she used to get iron infusions with her Rheumatologist. Routing to Dr. Mariea Clonts.

## 2019-04-28 NOTE — Telephone Encounter (Signed)
Let's check her "am cortisol" level and her iron panel Thursday morning at [place].  You can arrange with the clinic nurse tomorrow.

## 2019-04-29 NOTE — Telephone Encounter (Signed)
Noted  

## 2019-05-05 ENCOUNTER — Encounter: Payer: Self-pay | Admitting: Internal Medicine

## 2019-05-05 DIAGNOSIS — D638 Anemia in other chronic diseases classified elsewhere: Secondary | ICD-10-CM | POA: Diagnosis not present

## 2019-05-05 DIAGNOSIS — H40053 Ocular hypertension, bilateral: Secondary | ICD-10-CM | POA: Diagnosis not present

## 2019-05-05 DIAGNOSIS — H5203 Hypermetropia, bilateral: Secondary | ICD-10-CM | POA: Diagnosis not present

## 2019-05-05 DIAGNOSIS — E210 Primary hyperparathyroidism: Secondary | ICD-10-CM | POA: Diagnosis not present

## 2019-05-05 DIAGNOSIS — D649 Anemia, unspecified: Secondary | ICD-10-CM | POA: Diagnosis not present

## 2019-05-05 DIAGNOSIS — H18513 Endothelial corneal dystrophy, bilateral: Secondary | ICD-10-CM | POA: Diagnosis not present

## 2019-05-05 DIAGNOSIS — H40013 Open angle with borderline findings, low risk, bilateral: Secondary | ICD-10-CM | POA: Diagnosis not present

## 2019-05-06 ENCOUNTER — Encounter: Payer: Self-pay | Admitting: Internal Medicine

## 2019-05-06 DIAGNOSIS — D638 Anemia in other chronic diseases classified elsewhere: Secondary | ICD-10-CM | POA: Diagnosis not present

## 2019-05-06 DIAGNOSIS — D649 Anemia, unspecified: Secondary | ICD-10-CM | POA: Diagnosis not present

## 2019-05-06 LAB — IRON,TIBC AND FERRITIN PANEL: Ferritin: 1486

## 2019-05-12 ENCOUNTER — Telehealth: Payer: Self-pay

## 2019-05-12 ENCOUNTER — Encounter: Payer: Self-pay | Admitting: Internal Medicine

## 2019-05-12 NOTE — Telephone Encounter (Signed)
Result was given to patient She wants to know since her B12 shot is due should she get it because the numbers were high please advise.

## 2019-05-12 NOTE — Telephone Encounter (Signed)
Beth Wise would like to know is there anything she can  To decrease  Her Ferritin numbers. She did say that she talked to Beth Wise and the numbers made her nervous. Aster I told her that per Beth Wise ... Consistent w/ Chronic disease Not Ir deficiency she still had question of why. Please advise.

## 2019-05-12 NOTE — Telephone Encounter (Signed)
Ferritin is one of the markers in the body that increases when we have inflammation.  She has had the polymyalgia and her colitis.  Either of these can cause it to elevate.  There is nothing she herself can do to change that.  It is not worrisome.  She is going to be ok.

## 2019-05-13 NOTE — Telephone Encounter (Signed)
I want her to continue her b12 injections.  She just does not need to increase the frequency of them.

## 2019-05-15 ENCOUNTER — Telehealth: Payer: Self-pay

## 2019-05-15 NOTE — Telephone Encounter (Signed)
She has also been told by Mliss Sax and Randell Patient exactly what I said about her ferritin results as in some of the phone notes before.   Nothing needs to be changed at this point.

## 2019-05-15 NOTE — Telephone Encounter (Signed)
Patient states that she wants to know what you think about her labs done on 05/06/2019. Patient states that she's not happy with you not answering her questions. Patient also states that she thinks she should stop getting her B12 injections since nobody is answering her questions and that she wants to know what you think about her ferritin results. I told patient that she should continue getting her B12 injections as stated in previous phone calls.I informed patient that would pass along her questions to pcp Reed, Tiffany L, DO.  Please Advise.

## 2019-05-15 NOTE — Telephone Encounter (Signed)
Information was given to patient.

## 2019-05-15 NOTE — Telephone Encounter (Signed)
I kept repeating the same notes to the patient. But I'm not sure what else she wanted me to tell her earlier. I called patient just now and she states that she understands. Patient said "Thank you" for giving her feedback.

## 2019-07-08 ENCOUNTER — Telehealth: Payer: Self-pay

## 2019-07-08 ENCOUNTER — Non-Acute Institutional Stay: Payer: PPO | Admitting: Internal Medicine

## 2019-07-08 ENCOUNTER — Encounter: Payer: Self-pay | Admitting: Internal Medicine

## 2019-07-08 ENCOUNTER — Other Ambulatory Visit: Payer: Self-pay

## 2019-07-08 VITALS — BP 118/68 | HR 72 | Temp 96.8°F | Ht 62.0 in | Wt 125.0 lb

## 2019-07-08 DIAGNOSIS — D692 Other nonthrombocytopenic purpura: Secondary | ICD-10-CM | POA: Diagnosis not present

## 2019-07-08 DIAGNOSIS — E210 Primary hyperparathyroidism: Secondary | ICD-10-CM | POA: Diagnosis not present

## 2019-07-08 DIAGNOSIS — I5032 Chronic diastolic (congestive) heart failure: Secondary | ICD-10-CM

## 2019-07-08 DIAGNOSIS — H6122 Impacted cerumen, left ear: Secondary | ICD-10-CM

## 2019-07-08 DIAGNOSIS — F419 Anxiety disorder, unspecified: Secondary | ICD-10-CM

## 2019-07-08 DIAGNOSIS — E559 Vitamin D deficiency, unspecified: Secondary | ICD-10-CM

## 2019-07-08 DIAGNOSIS — Z7952 Long term (current) use of systemic steroids: Secondary | ICD-10-CM | POA: Diagnosis not present

## 2019-07-08 DIAGNOSIS — J301 Allergic rhinitis due to pollen: Secondary | ICD-10-CM

## 2019-07-08 DIAGNOSIS — M353 Polymyalgia rheumatica: Secondary | ICD-10-CM

## 2019-07-08 DIAGNOSIS — Z1589 Genetic susceptibility to other disease: Secondary | ICD-10-CM | POA: Diagnosis not present

## 2019-07-08 DIAGNOSIS — K52831 Collagenous colitis: Secondary | ICD-10-CM | POA: Diagnosis not present

## 2019-07-08 DIAGNOSIS — M150 Primary generalized (osteo)arthritis: Secondary | ICD-10-CM | POA: Diagnosis not present

## 2019-07-08 DIAGNOSIS — M48061 Spinal stenosis, lumbar region without neurogenic claudication: Secondary | ICD-10-CM | POA: Diagnosis not present

## 2019-07-08 DIAGNOSIS — M2550 Pain in unspecified joint: Secondary | ICD-10-CM | POA: Diagnosis not present

## 2019-07-08 DIAGNOSIS — Z6822 Body mass index (BMI) 22.0-22.9, adult: Secondary | ICD-10-CM | POA: Diagnosis not present

## 2019-07-08 MED ORDER — FLURAZEPAM HCL 15 MG PO CAPS: 15.0000 mg | ORAL_CAPSULE | Freq: Every evening | ORAL | 1 refills | Status: DC | PRN

## 2019-07-08 MED ORDER — CARBAMIDE PEROXIDE 6.5 % OT SOLN: 5.0000 [drp] | Freq: Every day | OTIC | 0 refills | Status: DC

## 2019-07-08 MED ORDER — IPRATROPIUM BROMIDE 0.03 % NA SOLN: 2.0000 | Freq: Two times a day (BID) | NASAL | 1 refills | Status: DC | PRN

## 2019-07-08 MED ORDER — VITAMIN D (ERGOCALCIFEROL) 1.25 MG (50000 UNIT) PO CAPS: 50000.0000 [IU] | ORAL_CAPSULE | ORAL | 0 refills | Status: DC

## 2019-07-08 NOTE — Telephone Encounter (Signed)
What did she say about it?  She had told me she would call about the multivitamin that was supposed to be covered by her insurance.

## 2019-07-08 NOTE — Telephone Encounter (Signed)
Patient called wanting to give you some information about HTA otc drugs.

## 2019-07-08 NOTE — Progress Notes (Signed)
Location:  [facility] of Service:  Clinic (12)  Provider: Clete Kuch L. Mariea Clonts, D.O., C.M.D.  Code Status: DNR Goals of Care:  Advanced Directives 04/06/2019  Does Patient Have a Medical Advance Directive? Yes  Type of Advance Directive Out of facility DNR (pink MOST or yellow form)  Does patient want to make changes to medical advance directive? No - Patient declined  Copy of [place] in Chart? -  Pre-existing out of facility DNR order (yellow form or pink MOST form) -     Chief Complaint  Patient presents with  . Medical Management of Chronic Issues    3 month follow-up   . Ear Problem    Examine ears for wax build-up     HPI: Patient is a 86 y.o. female seen today for medical management of chronic diseases.    She did see Dr. Trudie Scott Vanderveer, but she's going to be released as far as her PMR.  Took inflammatory markers.  Has been off prednisone since 5/26.    Her family visited from [place] three times since covid.    She is concerned about ear wax build-up.  It's not as bad as usual.  Some itching and feels some wax with her finger.  Left ear has a large cerumen chunk present.     Her drug plan will cover some over-the-counter.    Past Medical History:  Diagnosis Date  . Allergy   . Arthritis   . Back pain   . Blepharitis   . Cancer ([place])    basal cell on face  . Candidal esophagitis ([place]) 2010  . Cataract   . Chest pain, atypical   . Collagenous colitis    Dr. Maurene Capes  . Diverticulosis   . Fuchs' corneal dystrophy   . GERD (gastroesophageal reflux disease)   . Heart murmur   . Hiatal hernia 2010  . History of fibromyalgia   . Hypercalcemia   . Hyperlipidemia   . Hypertension   . IBS (irritable bowel syndrome)   . Insomnia   . Osteoporosis   . Primary hyperparathyroidism ([place]) 03/11/2017  . Rectocele   . Scoliosis   . Shingles 1975   at waistline, no chronic pain.    Marland Kitchen Spinal stenosis   . Vaginal prolapse      Past Surgical History:  Procedure Laterality Date  . ABDOMINAL HYSTERECTOMY    . BREAST BIOPSY  1964   left benign  . CARDIOVASCULAR STRESS TEST  12/03/2007   EF 72%  . CATARACT EXTRACTION W/ INTRAOCULAR LENS  IMPLANT, BILATERAL    . COLONOSCOPY  04/24/2006   diverticulosis  . FLEXIBLE SIGMOIDOSCOPY  07/27/2010   diverticulosis, collagenous colitis  . TONSILLECTOMY AND ADENOIDECTOMY    . UPPER GASTROINTESTINAL ENDOSCOPY  01/29/2008   esophagitis, hiatal hernia, gastritis  . US ECHOCARDIOGRAPHY  10/30/2007    EF 55-60%    Allergies  Allergen Reactions  . Actonel [Risedronate Sodium] Other (See Comments)    Joint pain  . Amoxicillin Diarrhea  . Erythromycin Other (See Comments)    Gi upset  . Lipitor [Atorvastatin Calcium]   . Zetia [Ezetimibe]     Joint pain    Outpatient Encounter Medications as of 07/08/2019  Medication Sig  . acetaminophen (TYLENOL) 500 MG tablet Take 500 mg by mouth 3 (three) times daily as needed.  . Cholecalciferol (VITAMIN D) 125 MCG (5000 UT) CAPS Take 1 tablet by mouth 3 times weekly  . cyanocobalamin (,VITAMIN  B-12,) 1000 MCG/ML injection Inject 1 mL (1,000 mcg total) into the muscle every 30 (thirty) days.  Marland Kitchen denosumab (PROLIA) 60 MG/ML SOSY injection Inject 60 mg into the skin every 6 (six) months.  Marland Kitchen estradiol (ESTRACE) 0.1 MG/GM vaginal cream Place 1 Applicatorful vaginally at bedtime.  . famotidine (PEPCID) 40 MG tablet Take 40 mg by mouth daily.  . flurazepam (DALMANE) 15 MG capsule Take 1 capsule (15 mg total) by mouth at bedtime as needed for sleep.  . GuaiFENesin (MUCINEX PO) Take 1 tablet by mouth every 12 (twelve) hours as needed (congestion).   . hydrocortisone 2.5 % cream Apply topically 2 (two) times daily.  Marland Kitchen ipratropium (ATROVENT) 0.03 % nasal spray Place 2 sprays into both nostrils 2 (two) times daily as needed for rhinitis. As directed nasal spray  . Melatonin 5 MG TABS Take 1 tablet by mouth at bedtime as needed (Takes an  additional as needed when wakes up).   . naproxen sodium (ALEVE) 220 MG tablet Take 220 mg by mouth as needed.  . [DISCONTINUED] famotidine (PEPCID) 40 MG tablet Take 1 tablet (40 mg total) by mouth 2 (two) times daily. (Patient not taking: Reported on 07/08/2019)  . [DISCONTINUED] Multiple Vitamin (MULTIVITAMIN) tablet Take 1 tablet by mouth daily.  . [DISCONTINUED] PREDNISONE PO Take 3 mg by mouth daily.   . [DISCONTINUED] Soft Lens Products (SALINE SENSITIVE EYES) SOLN Place 1 drop into both eyes 3 (three) times daily.   No facility-administered encounter medications on file as of 07/08/2019.    Review of Systems:  Review of Systems  Constitutional: Negative for chills and fever.  HENT: Negative for congestion.        Ear pressure  Eyes: Negative for blurred vision.  Respiratory: Negative for shortness of breath.   Cardiovascular: Negative for chest pain, palpitations and leg swelling.  Gastrointestinal: Negative for abdominal pain, constipation and diarrhea.  Genitourinary: Negative for dysuria.  Musculoskeletal: Positive for back pain. Negative for falls and joint pain.  Neurological: Negative for dizziness, loss of consciousness and weakness.  Psychiatric/Behavioral: Negative for depression and memory loss. The patient is not nervous/anxious and does not have insomnia.     Health Maintenance  Topic Date Due  . COVID-19 Vaccine (1) Never done  . INFLUENZA VACCINE  08/02/2019  . DEXA SCAN  09/10/2019  . MAMMOGRAM  09/15/2019  . TETANUS/TDAP  01/02/2020  . PNA vac Low Risk Adult  Completed    Physical Exam: Vitals:   07/08/19 1436  BP: 118/68  Pulse: 72  Temp: (!) 96.8 F (36 C)  TempSrc: Temporal  SpO2: 99%  Weight: 125 lb (56.7 kg)  Height: 5\' 2"  (1.575 m)   Body mass index is 22.86 kg/m. Physical Exam Vitals reviewed.  Constitutional:      Appearance: Normal appearance.  HENT:     Head: Normocephalic and atraumatic.     Right Ear: Tympanic membrane, ear canal  and external ear normal.     Left Ear: External ear normal. There is impacted cerumen.     Ears:     Comments: Left ear with large chunk of dry dark cerumen obstructing canal; otherwise canal looks normal; right ear benign    Nose: Rhinorrhea present.  Eyes:     Extraocular Movements: Extraocular movements intact.     Pupils: Pupils are equal, round, and reactive to light.  Cardiovascular:     Rate and Rhythm: Normal rate and regular rhythm.  Pulmonary:     Effort: Pulmonary effort  is normal.     Breath sounds: Normal breath sounds. No wheezing, rhonchi or rales.  Abdominal:     General: Bowel sounds are normal.  Musculoskeletal:        General: Normal range of motion.     Cervical back: Neck supple.     Right lower leg: No edema.     Left lower leg: No edema.  Skin:    General: Skin is warm and dry.  Neurological:     General: No focal deficit present.     Mental Status: She is alert and oriented to person, place, and time.  Psychiatric:        Mood and Affect: Mood normal.        Behavior: Behavior normal.        Thought Content: Thought content normal.        Judgment: Judgment normal.     Labs reviewed: Basic Metabolic Panel: Recent Labs    07/23/18 0000 10/14/18 0000  NA 134* 136*  K 4.5 4.7  BUN 23* 23*  CREATININE 1.1 1.0   Liver Function Tests: Recent Labs    07/23/18 0000  AST 20  ALT 18  ALKPHOS 28   No results for input(s): LIPASE, AMYLASE in the last 8760 hours. No results for input(s): AMMONIA in the last 8760 hours. CBC: Recent Labs    07/23/18 0000  WBC 10.9  NEUTROABS 8  HGB 11.4*  HCT 34*  PLT 206   Lipid Panel: No results for input(s): CHOL, HDL, LDLCALC, TRIG, CHOLHDL, LDLDIRECT in the last 8760 hours. Lab Results  Component Value Date   HGBA1C 5.4 09/23/2017     Assessment/Plan 1. Vitamin D deficiency - opted to resume her weekly supplement - Vitamin D, Ergocalciferol, (DRISDOL) 1.25 MG (50000 UNIT) CAPS capsule; Take 1  capsule (50,000 Units total) by mouth every 7 (seven) days.  Dispense: 12 capsule; Refill: 0  2. Hearing loss due to cerumen impaction, left - advised to use debrox nightly until she can be seen again next week for lavage - carbamide peroxide (DEBROX) 6.5 % OTIC solution; Place 5 drops into the left ear at bedtime. For one week  Dispense: 15 mL; Refill: 0  3. Polymyalgia rheumatica (HCC) -has weaned off prednisone, monitor, followed by Dr. Trudie Jessalyn Hinojosa  4. Senile purpura (HCC) -ongoing difficulty with easy bruising due to aging thinning skin and prior prednisone  5. Chronic diastolic CHF (congestive heart failure), NYHA class 1 (HCC) -stable and asymptomatic  6. Primary hyperparathyroidism ([facility] [place]) -being monitored  7. Anxiety - at time of rest -risks of this med have been discussed at her age and she's opted to continue it b/c she is worse off w/o sleep in her opinion - flurazepam (DALMANE) 15 MG capsule; Take 1 capsule (15 mg total) by mouth at bedtime as needed for sleep.  Dispense: 90 capsule; Refill: 1  8. Seasonal allergic rhinitis due to pollen -truly simply rhinitis of aging -had benefit from atrovent spray--doubt much fully body absorption to be concerned about anticholinergic effect -monitor  ipratropium (ATROVENT) 0.03 % nasal spray; Place 2 sprays into both nostrils 2 (two) times daily as needed for rhinitis. As directed nasal spray  Dispense: 90 mL; Refill: 1   Labs/tests ordered:  No new added today Next appt:  1 wk nurse visit for ear lavage  Mckaila Duffus L. Yilia Sacca, D.O. [facility] [address], [place] [address] Cell Phone (Mon-Fri 8am-5pm):  [phone] On Call:  [phone] & follow prompts after 5pm & weekends Office Phone:  [phone] Office Fax:  [phone]

## 2019-07-09 NOTE — Telephone Encounter (Signed)
Patient stated that she was unsure of which multivitamin she should choose. She stated its a long list of things on there. Wanted to send to your email

## 2019-07-09 NOTE — Telephone Encounter (Signed)
I know she does not use mychart.  It may be easier for her to drop the paperwork by with Beth Wise at the front desk of health care at [facility] if she makes her way over there.  She can make me a copy for when I'm there next week.

## 2019-07-10 NOTE — Telephone Encounter (Signed)
Called patient and told her what Dr. Mariea Clonts said and she stated that she will try to get paperwork over there.

## 2019-07-15 ENCOUNTER — Other Ambulatory Visit: Payer: Self-pay

## 2019-07-15 ENCOUNTER — Encounter: Payer: Self-pay | Admitting: Internal Medicine

## 2019-07-15 ENCOUNTER — Ambulatory Visit: Payer: PPO | Admitting: Internal Medicine

## 2019-07-15 VITALS — BP 118/68 | HR 72 | Temp 96.8°F | Ht 62.0 in | Wt 125.0 lb

## 2019-07-15 DIAGNOSIS — H6122 Impacted cerumen, left ear: Secondary | ICD-10-CM

## 2019-07-15 NOTE — Progress Notes (Addendum)
Location:  [facility] clinic Provider:  Bora Broner L. Mariea Clonts, D.O., C.M.D.  Code Status: DNR Goals of Care:  Advanced Directives 07/15/2019  Does Patient Have a Medical Advance Directive? Yes  Type of Advance Directive Out of facility DNR (pink MOST or yellow form);Healthcare Power of Attorney  Does patient want to make changes to medical advance directive? No - Patient declined  Copy of [place] in Chart? Yes - validated most recent copy scanned in chart (See row information)  Pre-existing out of facility DNR order (yellow form or pink MOST form) Pink MOST/Yellow Form most recent copy in chart - Physician notified to receive inpatient order     Chief Complaint  Patient presents with  . Acute Visit    Ear Lavage follow up     HPI: Patient is a 86 y.o. female seen today for left cerumen impaction.  CMA attempted ear lavage with warm water and peroxide; however, a large chunk of cerumen remained so she asked me to come in to remove it manually with instruments.    Past Medical History:  Diagnosis Date  . Allergy   . Arthritis   . Back pain   . Blepharitis   . Cancer ([facility])    basal cell on face  . Candidal esophagitis ([facility]) 2010  . Cataract   . Chest pain, atypical   . Collagenous colitis    Dr. Maurene Capes  . Diverticulosis   . Fuchs' corneal dystrophy   . GERD (gastroesophageal reflux disease)   . Heart murmur   . Hiatal hernia 2010  . History of fibromyalgia   . Hypercalcemia   . Hyperlipidemia   . Hypertension   . IBS (irritable bowel syndrome)   . Insomnia   . Osteoporosis   . Primary hyperparathyroidism ([facility] [place]) 03/11/2017  . Rectocele   . Scoliosis   . Shingles 1975   at waistline, no chronic pain.    Marland Kitchen Spinal stenosis   . Vaginal prolapse     Past Surgical History:  Procedure Laterality Date  . ABDOMINAL HYSTERECTOMY    . BREAST BIOPSY  1964   left benign  . CARDIOVASCULAR STRESS TEST  12/03/2007   EF 72%  . CATARACT EXTRACTION W/  INTRAOCULAR LENS  IMPLANT, BILATERAL    . COLONOSCOPY  04/24/2006   diverticulosis  . FLEXIBLE SIGMOIDOSCOPY  07/27/2010   diverticulosis, collagenous colitis  . TONSILLECTOMY AND ADENOIDECTOMY    . UPPER GASTROINTESTINAL ENDOSCOPY  01/29/2008   esophagitis, hiatal hernia, gastritis  . US ECHOCARDIOGRAPHY  10/30/2007    EF 55-60%    Allergies  Allergen Reactions  . Actonel [Risedronate Sodium] Other (See Comments)    Joint pain  . Amoxicillin Diarrhea  . Erythromycin Other (See Comments)    Gi upset  . Lipitor [Atorvastatin Calcium]   . Zetia [Ezetimibe]     Joint pain    Outpatient Encounter Medications as of 07/15/2019  Medication Sig  . acetaminophen (TYLENOL) 500 MG tablet Take 500 mg by mouth 3 (three) times daily as needed.  . carbamide peroxide (DEBROX) 6.5 % OTIC solution Place 5 drops into the left ear at bedtime. For one week  . cyanocobalamin (,VITAMIN B-12,) 1000 MCG/ML injection Inject 1 mL (1,000 mcg total) into the muscle every 30 (thirty) days.  Marland Kitchen denosumab (PROLIA) 60 MG/ML SOSY injection Inject 60 mg into the skin every 6 (six) months.  Marland Kitchen estradiol (ESTRACE) 0.1 MG/GM vaginal cream Place 1 Applicatorful vaginally at bedtime.  . famotidine (PEPCID) 40  MG tablet Take 40 mg by mouth daily.  . flurazepam (DALMANE) 15 MG capsule Take 1 capsule (15 mg total) by mouth at bedtime as needed for sleep.  . GuaiFENesin (MUCINEX PO) Take 1 tablet by mouth every 12 (twelve) hours as needed (congestion).   . hydrocortisone 2.5 % cream Apply topically 2 (two) times daily.  Marland Kitchen ipratropium (ATROVENT) 0.03 % nasal spray Place 2 sprays into both nostrils 2 (two) times daily as needed for rhinitis. As directed nasal spray  . Melatonin 5 MG TABS Take 1 tablet by mouth at bedtime as needed (Takes an additional as needed when wakes up).   . naproxen sodium (ALEVE) 220 MG tablet Take 220 mg by mouth as needed.  . Vitamin D, Ergocalciferol, (DRISDOL) 1.25 MG (50000 UNIT) CAPS capsule Take 1  capsule (50,000 Units total) by mouth every 7 (seven) days.   No facility-administered encounter medications on file as of 07/15/2019.    Review of Systems:  Review of Systems  Constitutional: Negative for chills and fever.  HENT: Positive for hearing loss.        Left ear pressure    Health Maintenance  Topic Date Due  . INFLUENZA VACCINE  08/02/2019  . DEXA SCAN  09/10/2019  . MAMMOGRAM  09/15/2019  . TETANUS/TDAP  01/02/2020  . COVID-19 Vaccine  Completed  . PNA vac Low Risk Adult  Completed    Physical Exam: Vitals:   07/15/19 1513  BP: 118/68  Pulse: 72  Temp: (!) 96.8 F (36 C)  SpO2: 99%  Weight: 125 lb (56.7 kg)  Height: 5\' 2"  (1.575 m)   Body mass index is 22.86 kg/m. Physical Exam Vitals reviewed.  Constitutional:      General: She is not in acute distress.    Appearance: Normal appearance. She is not toxic-appearing.  HENT:     Head: Normocephalic and atraumatic.     Right Ear: Tympanic membrane, ear canal and external ear normal.     Ears:     Comments: Left ear with impacted cerumen--now moist tan and dark brown to black in color Neurological:     Mental Status: She is alert.     Labs reviewed: Basic Metabolic Panel: Recent Labs    07/23/18 0000 10/14/18 0000  NA 134* 136*  K 4.5 4.7  BUN 23* 23*  CREATININE 1.1 1.0   Liver Function Tests: Recent Labs    07/23/18 0000  AST 20  ALT 18  ALKPHOS 28   No results for input(s): LIPASE, AMYLASE in the last 8760 hours. No results for input(s): AMMONIA in the last 8760 hours. CBC: Recent Labs    07/23/18 0000  WBC 10.9  NEUTROABS 8  HGB 11.4*  HCT 34*  PLT 206   Lipid Panel: No results for input(s): CHOL, HDL, LDLCALC, TRIG, CHOLHDL, LDLDIRECT in the last 8760 hours. Lab Results  Component Value Date   HGBA1C 5.4 09/23/2017    Assessment/Plan 1. Hearing loss due to cerumen impaction, left -ear first flushed with warm water and peroxide; however, large chunk of cerumen  remained obstructive so I used both the metal tweezer and the white plastic curette to remove additional cerumen -some cerumen did remain when I finished, but she had developed some irritation of the canal and discomfort so we opted to stop -advised she should continue using debrox drops weekly until her next visit so her cerumen will not become so hard and dry - Ear Lavage  Labs/tests ordered:  No new  Next appt:  11/11/2019  Aranza Geddes L. Evelise Reine, D.O. [facility] [address], [place] [address] Cell Phone (Mon-Fri 8am-5pm):  [phone] On Call:  [phone] & follow prompts after 5pm & weekends Office Phone:  [phone] Office Fax:  [phone]

## 2019-07-18 ENCOUNTER — Other Ambulatory Visit: Payer: Self-pay | Admitting: Internal Medicine

## 2019-07-18 DIAGNOSIS — E559 Vitamin D deficiency, unspecified: Secondary | ICD-10-CM

## 2019-08-18 ENCOUNTER — Other Ambulatory Visit: Payer: Self-pay

## 2019-08-18 MED ORDER — GLUCOSAMINE-CHONDROITIN 500-400 MG PO TABS: 1.0000 | ORAL_TABLET | Freq: Three times a day (TID) | ORAL | 0 refills | Status: DC

## 2019-08-18 MED ORDER — TURMERIC 500 MG PO CAPS: 1.0000 | ORAL_CAPSULE | Freq: Every day | ORAL | 0 refills | Status: DC

## 2019-08-18 NOTE — Progress Notes (Signed)
We've discussed the chiropractor though I suggested she have PT for her scoliosis--[facility] is on site at [facility] and so convenient to help her.  I signed the two supplements.

## 2019-08-18 NOTE — Progress Notes (Signed)
Patient states her insurance will now cover OTC meds and she is requesting these 2 medications.    Patient is also asking about a chiropractor that can help with scoliosis. She states she has spoken about this with you in the past.

## 2019-09-21 DIAGNOSIS — M810 Age-related osteoporosis without current pathological fracture: Secondary | ICD-10-CM | POA: Diagnosis not present

## 2019-09-21 DIAGNOSIS — M85851 Other specified disorders of bone density and structure, right thigh: Secondary | ICD-10-CM | POA: Diagnosis not present

## 2019-09-21 DIAGNOSIS — M85852 Other specified disorders of bone density and structure, left thigh: Secondary | ICD-10-CM | POA: Diagnosis not present

## 2019-09-21 DIAGNOSIS — Z1231 Encounter for screening mammogram for malignant neoplasm of breast: Secondary | ICD-10-CM | POA: Diagnosis not present

## 2019-09-21 LAB — HM DEXA SCAN

## 2019-09-21 LAB — HM MAMMOGRAPHY

## 2019-09-24 ENCOUNTER — Other Ambulatory Visit: Payer: Self-pay | Admitting: Family Medicine

## 2019-09-24 ENCOUNTER — Telehealth: Payer: Self-pay | Admitting: *Deleted

## 2019-09-24 MED ORDER — FLUTICASONE PROPIONATE 50 MCG/ACT NA SUSP: 1.0000 | Freq: Every day | NASAL | 1 refills | Status: DC | PRN

## 2019-09-24 NOTE — Telephone Encounter (Signed)
Fine by me 

## 2019-09-24 NOTE — Telephone Encounter (Signed)
Medication list updated and sent to pharmacy.  ?

## 2019-09-24 NOTE — Telephone Encounter (Signed)
Patient called requesting a refill on Fluticasone Nasal Spray. Stated that she is still using it for her allergies. Stated that it is not in her current medication list because she had stopped it for awhile but feels she needs it now and has been using it some with what she had on hand.   Patient is requesting a 90 day supply because she can get it cheaper.  Please Advise.

## 2019-09-29 ENCOUNTER — Telehealth: Payer: Self-pay

## 2019-09-29 ENCOUNTER — Encounter: Payer: Self-pay | Admitting: Internal Medicine

## 2019-09-29 NOTE — Telephone Encounter (Signed)
Per Dr. Mariea Clonts her Bone density test results are Osteoporosis and it is stable.Femur is worse 09/09/2017 it was -1.50 and on 09/21/2019 it was -1.90 L 1/3 Radius is better going from -3.00 09/09/2017 to -2.80 on 09/21/2019 Mammogram was negative. Results given to patient.

## 2019-10-01 ENCOUNTER — Telehealth: Payer: Self-pay | Admitting: General Practice

## 2019-10-01 NOTE — Telephone Encounter (Signed)
Copied from [place] [id number]. Topic: General - Other >> Sep 28, 2019  4:14 PM Leward Quan A wrote: Reason for CRM: Not a patient of Dr Joya Gaskins but.  Patient have read an article about Dr Joya Gaskins and the work that he does at [employer] and would like some information from his nurse asking for a call back.  Ph# [phone]

## 2019-10-02 NOTE — Telephone Encounter (Signed)
Spoke w/ pt and she stated that she read in the article that assistance was provided to homeless population for chronic back issues and she is wondering if Dr. Joya Gaskins can give her the info for the neurosurgeon and orthopedist that worked with him on this. She has scoliosis and other chronic back issues and wants to see if she can see those providers. She stated okay to send the info via MyChart or to call her.

## 2019-10-04 NOTE — Telephone Encounter (Signed)
Let the patient know that would not be applicable to her case,  She has [employer],  The program at the homeless shelter is for self pay patients

## 2019-10-05 NOTE — Telephone Encounter (Signed)
Spoke w/ pt and informed of Dr. Bettina Gavia message, pt verbalized understanding and will contact her PCP for a referral.

## 2019-10-22 ENCOUNTER — Telehealth: Payer: Self-pay

## 2019-10-22 NOTE — Telephone Encounter (Signed)
She can submit at urinalysis with the clinic nurse at [facility].  The urinalysis should include a measure of the pH of her urine.  Please let the clinic nurse today know about this so she can arrange with Beth Wise

## 2019-10-22 NOTE — Telephone Encounter (Signed)
Patient called and stated she is having burning sensation when peeing. She also stated it feels raw inside and has been going on all summer. She thinks her PH is off and would like to know what to do. Pleas advise.

## 2019-10-22 NOTE — Telephone Encounter (Signed)
Spoke with Nira Conn she stated she will get right on it. She is calling Ms. Willers now.

## 2019-10-23 ENCOUNTER — Encounter: Payer: Self-pay | Admitting: Internal Medicine

## 2019-10-23 DIAGNOSIS — R309 Painful micturition, unspecified: Secondary | ICD-10-CM | POA: Diagnosis not present

## 2019-11-09 ENCOUNTER — Telehealth: Payer: Self-pay | Admitting: Internal Medicine

## 2019-11-09 NOTE — Telephone Encounter (Signed)
Noted.  Beth Wise, now we know to be ready for ear lavage and possible debridement with a curette and tweezer device.

## 2019-11-09 NOTE — Telephone Encounter (Signed)
Patient called and wanted me to let Beth Wise know she wanted her ears checked and cleaned on Wednesday at [facility]. She said she needed to let Beth Wise know so she could bring her tools for it,

## 2019-11-09 NOTE — Telephone Encounter (Signed)
Noted  

## 2019-11-11 ENCOUNTER — Non-Acute Institutional Stay: Payer: PPO | Admitting: Internal Medicine

## 2019-11-11 ENCOUNTER — Other Ambulatory Visit: Payer: Self-pay

## 2019-11-11 ENCOUNTER — Encounter: Payer: Self-pay | Admitting: Internal Medicine

## 2019-11-11 VITALS — BP 118/60 | HR 92 | Temp 97.0°F | Ht 62.0 in | Wt 117.0 lb

## 2019-11-11 DIAGNOSIS — M48061 Spinal stenosis, lumbar region without neurogenic claudication: Secondary | ICD-10-CM

## 2019-11-11 DIAGNOSIS — F419 Anxiety disorder, unspecified: Secondary | ICD-10-CM | POA: Diagnosis not present

## 2019-11-11 DIAGNOSIS — E538 Deficiency of other specified B group vitamins: Secondary | ICD-10-CM

## 2019-11-11 DIAGNOSIS — N898 Other specified noninflammatory disorders of vagina: Secondary | ICD-10-CM

## 2019-11-11 DIAGNOSIS — D692 Other nonthrombocytopenic purpura: Secondary | ICD-10-CM | POA: Diagnosis not present

## 2019-11-11 DIAGNOSIS — M353 Polymyalgia rheumatica: Secondary | ICD-10-CM

## 2019-11-11 DIAGNOSIS — E210 Primary hyperparathyroidism: Secondary | ICD-10-CM | POA: Diagnosis not present

## 2019-11-11 DIAGNOSIS — Z66 Do not resuscitate: Secondary | ICD-10-CM

## 2019-11-11 NOTE — Progress Notes (Signed)
Location:  Occupational psychologist of Service:  Clinic (12)  Provider: Shawon Denzer L. Mariea Clonts, D.O., C.M.D.  Code Status: DNR Goals of Care:  Advanced Directives 11/11/2019  Does Patient Have a Medical Advance Directive? Yes  Type of Advance Directive Out of facility DNR (pink MOST or yellow form);Healthcare Power of Attorney  Does patient want to make changes to medical advance directive? No - Patient declined  Copy of [place] in Chart? Yes - validated most recent copy scanned in chart (See row information)  Pre-existing out of facility DNR order (yellow form or pink MOST form) Pink MOST/Yellow Form most recent copy in chart - Physician notified to receive inpatient order     Chief Complaint  Patient presents with  . Medical Management of Chronic Issues    4 month follow up   . Acute Visit    Check ears for wax removal, vaginal area feels irritated,  wants to come to office to get B12 shot     HPI: Patient is a 86 y.o. female seen today for medical management of chronic diseases.    She does not have a large appetite.  Thinks she's eating plenty.  Diarrhea does flare up occasionally.  She used budesonide just 3 days .  Otherwise she gets constipated.    CBD roll-on--applying to her lower back.  That is helping her.  Didn't take her tylenol before leaving but she's still doing ok after using the CBD.    She's having vaginal irritation when urinating.  It's a burning, rawness going on a long time.  Previously when she had irritation, there was itching, too, and she's not had that.  She uses estrace ointment compound from [employer] in [place].  She also contacted her gyn that prescribed the estrace.  She'd also prescribed nystatin/triamcinolone which really burned.  She wonders if it has to do with rectocele.  She does try to drink enough water.    She had her home NP visit with [facility].    She wants to come to [facility] for her B12 shots. She does  not get any help from insurance when she has to buy it and get clinic nurse to give it so wants to try getting it there at [facility] once and "see what happens".     Past Medical History:  Diagnosis Date  . Allergy   . Arthritis   . Back pain   . Blepharitis   . Cancer ([place])    basal cell on face  . Candidal esophagitis ([place]) 2010  . Cataract   . Chest pain, atypical   . Collagenous colitis    Dr. Maurene Capes  . Diverticulosis   . Fuchs' corneal dystrophy   . GERD (gastroesophageal reflux disease)   . Heart murmur   . Hiatal hernia 2010  . History of fibromyalgia   . Hypercalcemia   . Hyperlipidemia   . Hypertension   . IBS (irritable bowel syndrome)   . Insomnia   . Osteoporosis   . Primary hyperparathyroidism ([place]) 03/11/2017  . Rectocele   . Scoliosis   . Shingles 1975   at waistline, no chronic pain.    Marland Kitchen Spinal stenosis   . Vaginal prolapse     Past Surgical History:  Procedure Laterality Date  . ABDOMINAL HYSTERECTOMY    . BREAST BIOPSY  1964   left benign  . CARDIOVASCULAR STRESS TEST  12/03/2007   EF 72%  . CATARACT EXTRACTION W/ INTRAOCULAR  LENS  IMPLANT, BILATERAL    . COLONOSCOPY  04/24/2006   diverticulosis  . FLEXIBLE SIGMOIDOSCOPY  07/27/2010   diverticulosis, collagenous colitis  . TONSILLECTOMY AND ADENOIDECTOMY    . UPPER GASTROINTESTINAL ENDOSCOPY  01/29/2008   esophagitis, hiatal hernia, gastritis  . US ECHOCARDIOGRAPHY  10/30/2007    EF 55-60%    Allergies  Allergen Reactions  . Actonel [Risedronate Sodium] Other (See Comments)    Joint pain  . Amoxicillin Diarrhea  . Erythromycin Other (See Comments)    Gi upset  . Lipitor [Atorvastatin Calcium]   . Zetia [Ezetimibe]     Joint pain    Outpatient Encounter Medications as of 11/11/2019  Medication Sig  . acetaminophen (TYLENOL) 500 MG tablet Take 500 mg by mouth 3 (three) times daily as needed.  . Cannabidiol POWD by Does not apply route. This is a liquid roll on  . carbamide peroxide (DEBROX)  6.5 % OTIC solution Place 5 drops into the left ear at bedtime. For one week  . cyanocobalamin (,VITAMIN B-12,) 1000 MCG/ML injection Inject 1 mL (1,000 mcg total) into the muscle every 30 (thirty) days.  Marland Kitchen denosumab (PROLIA) 60 MG/ML SOSY injection Inject 60 mg into the skin every 6 (six) months.  Marland Kitchen estradiol (ESTRACE) 0.1 MG/GM vaginal cream Place 1 Applicatorful vaginally at bedtime.  . famotidine (PEPCID) 40 MG tablet Take 40 mg by mouth daily.  . flurazepam (DALMANE) 15 MG capsule Take 1 capsule (15 mg total) by mouth at bedtime as needed for sleep.  . fluticasone (FLONASE) 50 MCG/ACT nasal spray Place 1 spray into both nostrils daily as needed for allergies or rhinitis.  Marland Kitchen glucosamine-chondroitin (MAX GLUCOSAMINE CHONDROITIN) 500-400 MG tablet Take 1 tablet by mouth 3 (three) times daily.  . GuaiFENesin (MUCINEX PO) Take 1 tablet by mouth every 12 (twelve) hours as needed (congestion).   . hydrocortisone 2.5 % cream Apply topically 2 (two) times daily.  Marland Kitchen ipratropium (ATROVENT) 0.03 % nasal spray Place 2 sprays into both nostrils 2 (two) times daily as needed for rhinitis. As directed nasal spray  . Melatonin 5 MG TABS Take 1 tablet by mouth at bedtime as needed (Takes an additional as needed when wakes up).   . naproxen sodium (ALEVE) 220 MG tablet Take 220 mg by mouth as needed.  . Vitamin D, Ergocalciferol, (DRISDOL) 1.25 MG (50000 UNIT) CAPS capsule Take 1 capsule (50,000 Units total) by mouth every 7 (seven) days.  . [DISCONTINUED] Turmeric (QC TUMERIC COMPLEX) 500 MG CAPS Take 1 capsule by mouth daily.   No facility-administered encounter medications on file as of 11/11/2019.    Review of Systems:  Review of Systems  Constitutional: Positive for weight loss. Negative for chills, fever and malaise/fatigue.  HENT: Negative for congestion, hearing loss and sore throat.   Eyes: Negative for blurred vision.       Glasses  Respiratory: Negative for cough and shortness of breath.     Cardiovascular: Negative for chest pain, palpitations and leg swelling.  Gastrointestinal: Positive for diarrhea. Negative for abdominal pain, blood in stool, constipation and melena.       See hpi  Genitourinary: Positive for dysuria. Negative for flank pain, frequency, hematuria and urgency.       Rectocele  Musculoskeletal: Positive for back pain. Negative for falls and joint pain.       Uses rollator walker  Skin: Negative for itching and rash.  Neurological: Negative for dizziness and loss of consciousness.  Psychiatric/Behavioral: Negative for depression and  memory loss. The patient is nervous/anxious and has insomnia.     Health Maintenance  Topic Date Due  . TETANUS/TDAP  01/02/2020  . MAMMOGRAM  09/20/2020  . DEXA SCAN  09/20/2021  . INFLUENZA VACCINE  Completed  . COVID-19 Vaccine  Completed  . PNA vac Low Risk Adult  Completed    Physical Exam: Vitals:   11/11/19 1359  BP: 118/60  Pulse: 92  Temp: (!) 97 F (36.1 C)  TempSrc: Temporal  SpO2: 98%  Weight: 117 lb (53.1 kg)  Height: 5\' 2"  (1.575 m)   Body mass index is 21.4 kg/m. Physical Exam Constitutional:      General: She is not in acute distress.    Appearance: Normal appearance. She is not toxic-appearing.  HENT:     Head: Normocephalic and atraumatic.     Right Ear: Tympanic membrane, ear canal and external ear normal. There is no impacted cerumen.     Left Ear: Tympanic membrane, ear canal and external ear normal. There is no impacted cerumen.  Eyes:     Conjunctiva/sclera: Conjunctivae normal.     Pupils: Pupils are equal, round, and reactive to light.     Comments: glasses  Cardiovascular:     Rate and Rhythm: Normal rate and regular rhythm.     Pulses: Normal pulses.     Heart sounds: Normal heart sounds.  Pulmonary:     Effort: Pulmonary effort is normal.     Breath sounds: Normal breath sounds. No wheezing, rhonchi or rales.  Abdominal:     General: Bowel sounds are normal.   Musculoskeletal:        General: Normal range of motion.     Cervical back: Neck supple.     Right lower leg: No edema.     Left lower leg: No edema.     Comments: Ambulates with rollator walker  Lymphadenopathy:     Cervical: No cervical adenopathy.  Skin:    General: Skin is warm and dry.  Neurological:     General: No focal deficit present.     Mental Status: She is alert and oriented to person, place, and time.  Psychiatric:        Mood and Affect: Mood normal.        Behavior: Behavior normal.     Labs reviewed: Basic Metabolic Panel: No results for input(s): NA, K, CL, CO2, GLUCOSE, BUN, CREATININE, CALCIUM, MG, PHOS, TSH in the last 8760 hours. Liver Function Tests: No results for input(s): AST, ALT, ALKPHOS, BILITOT, PROT, ALBUMIN in the last 8760 hours. No results for input(s): LIPASE, AMYLASE in the last 8760 hours. No results for input(s): AMMONIA in the last 8760 hours. CBC: No results for input(s): WBC, NEUTROABS, HGB, HCT, MCV, PLT in the last 8760 hours. Lipid Panel: No results for input(s): CHOL, HDL, LDLCALC, TRIG, CHOLHDL, LDLDIRECT in the last 8760 hours. Lab Results  Component Value Date   HGBA1C 5.4 09/23/2017    Procedures since last visit: No results found.  Assessment/Plan 1. Polymyalgia rheumatica (HCC) -off prednisone, no active symptoms  2. Senile purpura (HCC) -ongoing at advanced age with thin skin, loss of elastin and collagen  3. Primary hyperparathyroidism ([place]) -has been stable with last lab check of PTH  4. Anxiety -chronic, stable with her hs anxiolytic/sedative hypnotic that we've been unable to successfully wean  5. Vitamin B12 deficiency -cont monthly injections, CMA to set up appt at [facility] for next b12 injection per pt request  6. Spinal  stenosis of lumbar region without neurogenic claudication -cont CBD roll-on which has been so helpful to her -I advised that it cannot be causing her vaginal irritation  7. DNR (do not  resuscitate) -order remains her wish, reentered; has MOST that we updated today  8.  Vaginal irritation:  Reported; she was concerned that her cbd was getting her her urine and causing but topical so no the cause; reminded about importance of hydration with water to prevent concentrated urine which will irritate dry postmenopausal tissues; is using her estrace and also has her rectocele that may be a contributor to discomfort -f/u with gyn about this  Labs/tests ordered:  B12, cbc, cmp Next appt:  4 mos with fasting labs before  Wants to get b12 done at [facility] this next time AWV is 12/30 with Jessica--pt told this but asked again at end of visit for a call to remind her  Deandra Goering L. Marland Reine, D.O. [facility] [address],  [address] Cell Phone (Mon-Fri 8am-5pm):  [phone] On Call:  [phone] & follow prompts after 5pm & weekends Office Phone:  [phone] Office Fax:  [phone]

## 2019-11-16 DIAGNOSIS — Z961 Presence of intraocular lens: Secondary | ICD-10-CM | POA: Diagnosis not present

## 2019-11-16 DIAGNOSIS — H524 Presbyopia: Secondary | ICD-10-CM | POA: Diagnosis not present

## 2019-11-18 ENCOUNTER — Other Ambulatory Visit: Payer: Self-pay | Admitting: Internal Medicine

## 2019-11-18 DIAGNOSIS — E538 Deficiency of other specified B group vitamins: Secondary | ICD-10-CM

## 2019-11-18 MED ORDER — CYANOCOBALAMIN 1000 MCG/ML IJ SOLN: 1000.0000 ug | INTRAMUSCULAR | 3 refills | Status: DC

## 2019-11-18 NOTE — Telephone Encounter (Signed)
Patient had left me a message to call her regarding her B12 injections.  She is requesting for Dr. Mariea Clonts to call her B12 prescription to [name] at [facility] so that she can pick it up.  She is going to continue to have B12 injections done by nurse at [facility].  Thank you   Adan Sis

## 2019-11-18 NOTE — Telephone Encounter (Signed)
Rx sent to [facility].  So any appts made for b12 at [facility] should be canceled if she's getting it done here again.

## 2019-11-19 NOTE — Telephone Encounter (Signed)
Noted  

## 2019-11-30 DIAGNOSIS — Z1589 Genetic susceptibility to other disease: Secondary | ICD-10-CM | POA: Diagnosis not present

## 2019-11-30 DIAGNOSIS — K52831 Collagenous colitis: Secondary | ICD-10-CM | POA: Diagnosis not present

## 2019-11-30 DIAGNOSIS — M150 Primary generalized (osteo)arthritis: Secondary | ICD-10-CM | POA: Diagnosis not present

## 2019-11-30 DIAGNOSIS — M48061 Spinal stenosis, lumbar region without neurogenic claudication: Secondary | ICD-10-CM | POA: Diagnosis not present

## 2019-11-30 DIAGNOSIS — M353 Polymyalgia rheumatica: Secondary | ICD-10-CM | POA: Diagnosis not present

## 2019-11-30 DIAGNOSIS — Z6821 Body mass index (BMI) 21.0-21.9, adult: Secondary | ICD-10-CM | POA: Diagnosis not present

## 2019-11-30 DIAGNOSIS — Z7952 Long term (current) use of systemic steroids: Secondary | ICD-10-CM | POA: Diagnosis not present

## 2019-12-02 ENCOUNTER — Ambulatory Visit: Payer: Self-pay

## 2019-12-31 ENCOUNTER — Ambulatory Visit (INDEPENDENT_AMBULATORY_CARE_PROVIDER_SITE_OTHER): Payer: PPO | Admitting: Nurse Practitioner

## 2019-12-31 ENCOUNTER — Encounter: Payer: Self-pay | Admitting: Nurse Practitioner

## 2019-12-31 ENCOUNTER — Other Ambulatory Visit: Payer: Self-pay

## 2019-12-31 ENCOUNTER — Telehealth: Payer: Self-pay

## 2019-12-31 DIAGNOSIS — Z0000 Encounter for general adult medical examination without abnormal findings: Secondary | ICD-10-CM

## 2019-12-31 NOTE — Progress Notes (Signed)
Subjective:   Beth Wise is a 86 y.o. female who presents for Medicare Annual (Subsequent) preventive examination.  Review of Systems     Cardiac Risk Factors include: advanced age (>71men, >19 women);sedentary lifestyle;dyslipidemia     Objective:    There were no vitals filed for this visit. There is no height or weight on file to calculate BMI.  Advanced Directives 12/31/2019 11/11/2019 07/15/2019 04/06/2019 01/07/2019 12/30/2018 12/30/2018  Does Patient Have a Medical Advance Directive? Yes Yes Yes Yes No Yes Yes  Type of Estate agent of [employer];Out of facility DNR (pink MOST or yellow form) Out of facility DNR (pink MOST or yellow form);Healthcare Power of [employer] of facility DNR (pink MOST or yellow form);Healthcare Power of [employer] of facility DNR (pink MOST or yellow form) - Out of facility DNR (pink MOST or yellow form) Out of facility DNR (pink MOST or yellow form)  Does patient want to make changes to medical advance directive? No - Patient declined No - Patient declined No - Patient declined No - Patient declined - No - Patient declined No - Patient declined  Copy of Healthcare Power of Attorney in Chart? - Yes - validated most recent copy scanned in chart (See row information) Yes - validated most recent copy scanned in chart (See row information) - - - -  Pre-existing out of facility DNR order (yellow form or pink MOST form) Yellow form placed in chart (order not valid for inpatient use) Pink MOST/Yellow Form most recent copy in chart - Physician notified to receive inpatient order Pink MOST/Yellow Form most recent copy in chart - Physician notified to receive inpatient order - - Pink MOST/Yellow Form most recent copy in chart - Physician notified to receive inpatient order -    Current Medications (verified) Outpatient Encounter Medications as of 12/31/2019  Medication Sig  . acetaminophen (TYLENOL) 500 MG tablet Take 500 mg by mouth 3  (three) times daily as needed.  . Cannabidiol POWD by Does not apply route. This is a liquid roll on  . carbamide peroxide (DEBROX) 6.5 % OTIC solution Place 5 drops into the left ear at bedtime. For one week  . cyanocobalamin (,VITAMIN B-12,) 1000 MCG/ML injection Inject 1 mL (1,000 mcg total) into the muscle every 30 (thirty) days.  Marland Kitchen denosumab (PROLIA) 60 MG/ML SOSY injection Inject 60 mg into the skin every 6 (six) months.  Marland Kitchen estradiol (ESTRACE) 0.1 MG/GM vaginal cream Place 1 Applicatorful vaginally at bedtime.  . famotidine (PEPCID) 40 MG tablet Take 40 mg by mouth daily.  . flurazepam (DALMANE) 15 MG capsule Take 1 capsule (15 mg total) by mouth at bedtime as needed for sleep.  . fluticasone (FLONASE) 50 MCG/ACT nasal spray Place 1 spray into both nostrils daily as needed for allergies or rhinitis.  . GuaiFENesin (MUCINEX PO) Take 1 tablet by mouth every 12 (twelve) hours as needed (congestion).   . hydrocortisone 2.5 % cream Apply topically 2 (two) times daily.  Marland Kitchen ipratropium (ATROVENT) 0.03 % nasal spray Place 2 sprays into both nostrils 2 (two) times daily as needed for rhinitis. As directed nasal spray  . Melatonin 5 MG TABS Take 1 tablet by mouth at bedtime as needed (Takes an additional as needed when wakes up).   . naproxen sodium (ALEVE) 220 MG tablet Take 220 mg by mouth as needed.  . Vitamin D, Ergocalciferol, (DRISDOL) 1.25 MG (50000 UNIT) CAPS capsule Take 1 capsule (50,000 Units total) by mouth every  7 (seven) days.  . [DISCONTINUED] glucosamine-chondroitin (MAX GLUCOSAMINE CHONDROITIN) 500-400 MG tablet Take 1 tablet by mouth 3 (three) times daily.   No facility-administered encounter medications on file as of 12/31/2019.    Allergies (verified) Actonel [risedronate sodium], Amoxicillin, Erythromycin, Lipitor [atorvastatin calcium], and Zetia [ezetimibe]   History: Past Medical History:  Diagnosis Date  . Allergy   . Arthritis   . Back pain   . Blepharitis   .  Cancer ([place])    basal cell on face  . Candidal esophagitis ([place]) 2010  . Cataract   . Chest pain, atypical   . Collagenous colitis    Dr. Maurene Capes  . Diverticulosis   . Fuchs' corneal dystrophy   . GERD (gastroesophageal reflux disease)   . Heart murmur   . Hiatal hernia 2010  . History of fibromyalgia   . Hypercalcemia   . Hyperlipidemia   . Hypertension   . IBS (irritable bowel syndrome)   . Insomnia   . Osteoporosis   . Primary hyperparathyroidism ([place]) 03/11/2017  . Rectocele   . Scoliosis   . Shingles 1975   at waistline, no chronic pain.    Marland Kitchen Spinal stenosis   . Vaginal prolapse    Past Surgical History:  Procedure Laterality Date  . ABDOMINAL HYSTERECTOMY    . BREAST BIOPSY  1964   left benign  . CARDIOVASCULAR STRESS TEST  12/03/2007   EF 72%  . CATARACT EXTRACTION W/ INTRAOCULAR LENS  IMPLANT, BILATERAL    . COLONOSCOPY  04/24/2006   diverticulosis  . FLEXIBLE SIGMOIDOSCOPY  07/27/2010   diverticulosis, collagenous colitis  . TONSILLECTOMY AND ADENOIDECTOMY    . UPPER GASTROINTESTINAL ENDOSCOPY  01/29/2008   esophagitis, hiatal hernia, gastritis  . US ECHOCARDIOGRAPHY  10/30/2007    EF 55-60%   Family History  Problem Relation Age of Onset  . Breast cancer Mother        died at 77  . Stroke Mother   . Heart disease Father        died at 48  . Cirrhosis Sister        died at 72  . Heart disease Brother        died at 34  . Pneumonia Sister        died at 71 months  . Heart disease Sister        died at 36  . Cancer - Other Sister        stomach; died at 39  . Stomach cancer Sister   . Heart disease Brother        c-diff and sepsis; died at 38  . Colon cancer Neg Hx   . Diabetes Neg Hx   . Rectal cancer Neg Hx   . Esophageal cancer Neg Hx    Social History   Socioeconomic History  . Marital status: Widowed    Spouse name: Not on file  . Number of children: 2  . Years of education: Not on file  . Highest education level: Not on file   Occupational History  . Occupation: retired    Comment: owned Human resources officer  Tobacco Use  . Smoking status: Never Smoker  . Smokeless tobacco: Never Used  Vaping Use  . Vaping Use: Never used  Substance and Sexual Activity  . Alcohol use: Not Currently  . Drug use: No  . Sexual activity: Never  Other Topics Concern  . Not on file  Social History Narrative   Social History  Diet? Regular (shouldn't have excessive amts of acid, hot &spicey foods)      Do you drink/eat things with caffeine? yes      Marital status?       widow                             What year were you married? [place] you live in a house, apartment, assisted living, condo, trailer, etc.? apartment      Is it one or more stories? 1      How many persons live in your home? 1 (self)      Do you have any pets in your home? (please list) no no !!      Highest level of education completed?      Current or past profession: hairdresser      Do you exercise?      little                                Type & how often? Leg lifts and plank, stretching every day.      Advanced Directives      Do you have a living will?  yes      Do you have a DNR form?         yes                         If not, do you want to discuss one? yes      Do you have signed POA/HPOA for forms? yes      Functional Status      Do you have difficulty bathing or dressing yourself?      Do you have difficulty preparing food or eating?       Do you have difficulty managing your medications?      Do you have difficulty managing your finances?      Do you have difficulty affording your medications?   Social Determinants of Health   Financial Resource Strain: Not on file  Food Insecurity: Not on file  Transportation Needs: Not on file  Physical Activity: Not on file  Stress: Not on file  Social Connections: Not on file    Tobacco Counseling Counseling given: Not Answered   Clinical Intake:  Pre-visit  preparation completed: Yes  Pain : No/denies pain     BMI - recorded: 21 Nutritional Status: BMI of 19-24  Normal Nutritional Risks: Unintentional weight loss Diabetes: No  How often do you need to have someone help you when you read instructions, pamphlets, or other written materials from your doctor or pharmacy?: 1 - Never  Diabetic?no         Activities of Daily Living In your present state of health, do you have any difficulty performing the following activities: 12/31/2019  Hearing? N  Vision? Y  Difficulty concentrating or making decisions? N  Walking or climbing stairs? N  Dressing or bathing? N  Doing errands, shopping? N  Preparing Food and eating ? N  Using the Toilet? N  In the past six months, have you accidently leaked urine? N  Do you have problems with loss of bowel control? N  Managing your Medications? N  Managing your Finances? N  Housekeeping or managing your Housekeeping? N  Some recent data might  be hidden    Patient Care Team: Gayland Curry, DO as PCP - General (Geriatric Medicine) Rolm Bookbinder, MD as Consulting Physician (Dermatology) Marti Sleigh, MD as Consulting Physician (Gynecology) Stanford Breed Denice Bors, MD as Consulting Physician (Cardiology) Luberta Mutter, MD as Consulting Physician (Ophthalmology) Jola Baptist, [place] as Referring Physician (Chiropractic Medicine) Neomia Dear (Chiropractic Medicine) Starling Manns, MD (Orthopedic Surgery) Gatha Mayer, MD as Consulting Physician (Gastroenterology)  Indicate any recent Medical Services you may have received from other than [facility] providers in the past year (date may be approximate).     Assessment:   This is a routine wellness examination for Beth Wise.  Hearing/Vision screen  Hearing Screening   125Hz  250Hz  500Hz  1000Hz  2000Hz  3000Hz  4000Hz  6000Hz  8000Hz   Right ear:           Left ear:           Comments: Patient has no hearing problems.  Vision Screening Comments:  Patient has vision problems. Patient Has eye exam every 6 months. Patient sees Dr. Ellie Lunch  Dietary issues and exercise activities discussed: Current Exercise Habits: Home exercise routine, Type of exercise: stretching;calisthenics, Time (Minutes): 10, Frequency (Times/Week): 5, Weekly Exercise (Minutes/Week): 50  Goals    . DIET - INCREASE WATER INTAKE     Patient will use 3 thermoses to drink more water.    . Patient Stated     Stay active.       Depression Screen PHQ 2/9 Scores 12/31/2019 11/11/2019 07/08/2019 04/06/2019 01/07/2019 12/30/2018 10/22/2018  PHQ - 2 Score 0 0 0 0 0 0 0  PHQ- 9 Score - - - - - - -    Fall Risk Fall Risk  12/31/2019 11/11/2019 07/15/2019 07/08/2019 04/06/2019  Falls in the past year? 0 0 0 0 0  Number falls in past yr: 0 0 0 0 0  Injury with Fall? 0 0 0 0 0  Risk for fall due to : - - - - -  Follow up - - - - -    [place]:  Any stairs in or around the home? Yes  If so, are there any without handrails? No  Home free of loose throw rugs in walkways, pet beds, electrical cords, etc? Yes  Adequate lighting in your home to reduce risk of falls? Yes   ASSISTIVE DEVICES UTILIZED TO PREVENT FALLS:  Life alert? No  Use of a cane, walker or w/c? Yes  Grab bars in the bathroom? Yes  Shower chair or bench in shower? No  Elevated toilet seat or a handicapped toilet? Yes   TIMED UP AND GO:  Was the test performed? No .    Cognitive Function: MMSE - Mini Mental State Exam 12/10/2018 12/06/2017 11/27/2016  Not completed: (No Data) - -  Orientation to time 5 5 5   Orientation to Place 5 5 5   Registration 3 3 3   Attention/ Calculation 5 5 5   Recall 3 2 2   Language- name 2 objects 2 2 2   Language- repeat 1 1 1   Language- follow 3 step command 3 3 3   Language- read & follow direction 1 1 1   Write a sentence 1 1 1   Copy design 1 1 1   Total score 30 29 29      6CIT Screen 12/31/2019 12/30/2018  What Year? 0 points 0 points   What month? 0 points 0 points  What time? 0 points 0 points  Count back from 20 0 points  0 points  Months in reverse 0 points 0 points  Repeat phrase 0 points 0 points  Total Score 0 0    Immunizations Immunization History  Administered Date(s) Administered  . Influenza, High Dose Seasonal PF 09/23/2017, 10/10/2018, 10/30/2019  . Influenza-Unspecified 09/01/2015, 10/22/2016  . Moderna Sars-Covid-2 Vaccination 01/11/2019, 02/10/2019, 11/17/2019  . Pneumococcal Conjugate-13 11/07/2016  . Pneumococcal-Unspecified 01/02/2015  . Td 01/01/2010  . Zoster Recombinat (Shingrix) 02/14/2017, 04/17/2017    TDAP status: Due, Education has been provided regarding the importance of this vaccine. Advised may receive this vaccine at local pharmacy or Health Dept. Aware to provide a copy of the vaccination record if obtained from local pharmacy or Health Dept. Verbalized acceptance and understanding.  Flu Vaccine status: Up to date  Pneumococcal vaccine status: Up to date  Covid-19 vaccine status: Completed vaccines  Qualifies for Shingles Vaccine? Yes   Zostavax completed No   Shingrix Completed?: Yes  Screening Tests Health Maintenance  Topic Date Due  . TETANUS/TDAP  01/02/2020  . MAMMOGRAM  09/20/2020  . DEXA SCAN  09/20/2021  . INFLUENZA VACCINE  Completed  . COVID-19 Vaccine  Completed  . PNA vac Low Risk Adult  Completed    Health Maintenance  There are no preventive care reminders to display for this patient.  Colorectal cancer screening: No longer required.   Mammogram status: No longer required due to aged out.  Bone Density status: Completed 02/2019. Results reflect: Bone density results: OSTEOPOROSIS. Repeat every 2 years.  Lung Cancer Screening: (Low Dose CT Chest recommended if Age 38-80 years, 30 pack-year currently smoking OR have quit w/in 15years.) does not qualify.   Lung Cancer Screening Referral: na  Additional Screening:  Hepatitis C Screening: does not  qualify; Completed na  Vision Screening: Recommennaded annual ophthalmology exams for early detection of glaucoma and other disorders of the eye. Is the patient up to date with their annual eye exam?  yes Who is the provider or what is the name of the office in which the patient attends annual eye exams? [facility] If pt is not established with a provider, would they like to be referred to a provider to establish care? No .   Dental Screening: Recommended annual dental exams for proper oral hygiene  Community Resource Referral / Chronic Care Management: CRR required this visit?  No   CCM required this visit?  No      Plan:     I have personally reviewed and noted the following in the patient's chart:   . Medical and social history . Use of alcohol, tobacco or illicit drugs  . Current medications and supplements . Functional ability and status . Nutritional status . Physical activity . Advanced directives . List of other physicians . Hospitalizations, surgeries, and ER visits in previous 12 months . Vitals . Screenings to include cognitive, depression, and falls . Referrals and appointments  In addition, I have reviewed and discussed with patient certain preventive protocols, quality metrics, and best practice recommendations. A written personalized care plan for preventive services as well as general preventive health recommendations were provided to patient.     Sharon Seller, NP   12/31/2019    Virtual Visit via Telephone Note  I connected with@ on 12/31/19 at  2:15 PM EST by telephone and verified that I am speaking with the correct person using two identifiers.  Location: Patient: home Provider: in office   I discussed the limitations, risks, security and privacy concerns of performing an evaluation  and management service by telephone and the availability of in person appointments. I also discussed with the patient that there may be a patient responsible charge  related to this service. The patient expressed understanding and agreed to proceed.   I discussed the assessment and treatment plan with the patient. The patient was provided an opportunity to ask questions and all were answered. The patient agreed with the plan and demonstrated an understanding of the instructions.   The patient was advised to call back or seek an in-person evaluation if the symptoms worsen or if the condition fails to improve as anticipated.  I provided 15 minutes of non-face-to-face time during this encounter.  Carlos American. Harle Battiest Avs printed and mailed

## 2019-12-31 NOTE — Telephone Encounter (Signed)
[name], Beth Wise are scheduled for a virtual visit with your provider today.    Just as we do with appointments in the office, we must obtain your consent to participate.  Your consent will be active for this visit and any virtual visit you may have with one of our providers in the next 365 days.    If you have a MyChart account, I can also send a copy of this consent to you electronically.  All virtual visits are billed to your insurance company just like a traditional visit in the office.  As this is a virtual visit, video technology does not allow for your provider to perform a traditional examination.  This may limit your provider's ability to fully assess your condition.  If your provider identifies any concerns that need to be evaluated in person or the need to arrange testing such as labs, EKG, etc, we will make arrangements to do so.    Although advances in technology are sophisticated, we cannot ensure that it will always work on either your end or our end.  If the connection with a video visit is poor, we may have to switch to a telephone visit.  With either a video or telephone visit, we are not always able to ensure that we have a secure connection.   I need to obtain your verbal consent now.   Are you willing to proceed with your visit today?   Beth Wise has provided verbal consent on 12/31/2019 for a virtual visit (video or telephone).   Elveria Royals, CMA 12/31/2019  2:57 PM

## 2019-12-31 NOTE — Patient Instructions (Addendum)
[name] , Thank you for taking time to come for your Medicare Wellness Visit. I appreciate your ongoing commitment to your health goals. Please review the following plan we discussed and let me know if I can assist you in the future.   Screening recommendations/referrals: Colonoscopy aged out Mammogram aged out Bone Density up to date Recommended yearly ophthalmology/optometry visit for glaucoma screening and checkup Recommended yearly dental visit for hygiene and checkup  Vaccinations: Influenza vaccine up to date Pneumococcal vaccine up to date Tdap vaccine- RECOMMENDED TO GET TDAP at your local pharmacy.  Shingles vaccine up to date    Advanced directives: on file  Conditions/risks identified: advanced age, fall risk  Next appointment: 1 year   Preventive Care 19 Years and Older, Female Preventive care refers to lifestyle choices and visits with your health care provider that can promote health and wellness. What does preventive care include?  A yearly physical exam. This is also called an annual well check.  Dental exams once or twice a year.  Routine eye exams. Ask your health care provider how often you should have your eyes checked.  Personal lifestyle choices, including:  Daily care of your teeth and gums.  Regular physical activity.  Eating a healthy diet.  Avoiding tobacco and drug use.  Limiting alcohol use.  Practicing safe sex.  Taking low-dose aspirin every day.  Taking vitamin and mineral supplements as recommended by your health care provider. What happens during an annual well check? The services and screenings done by your health care provider during your annual well check will depend on your age, overall health, lifestyle risk factors, and family history of disease. Counseling  Your health care provider may ask you questions about your:  Alcohol use.  Tobacco use.  Drug use.  Emotional well-being.  Home and relationship  well-being.  Sexual activity.  Eating habits.  History of falls.  Memory and ability to understand (cognition).  Work and work Astronomer.  Reproductive health. Screening  You may have the following tests or measurements:  Height, weight, and BMI.  Blood pressure.  Lipid and cholesterol levels. These may be checked every 5 years, or more frequently if you are over 62 years old.  Skin check.  Lung cancer screening. You may have this screening every year starting at age 63 if you have a 30-pack-year history of smoking and currently smoke or have quit within the past 15 years.  Fecal occult blood test (FOBT) of the stool. You may have this test every year starting at age 62.  Flexible sigmoidoscopy or colonoscopy. You may have a sigmoidoscopy every 5 years or a colonoscopy every 10 years starting at age 61.  Hepatitis C blood test.  Hepatitis B blood test.  Sexually transmitted disease (STD) testing.  Diabetes screening. This is done by checking your blood sugar (glucose) after you have not eaten for a while (fasting). You may have this done every 1-3 years.  Bone density scan. This is done to screen for osteoporosis. You may have this done starting at age 77.  Mammogram. This may be done every 1-2 years. Talk to your health care provider about how often you should have regular mammograms. Talk with your health care provider about your test results, treatment options, and if necessary, the need for more tests. Vaccines  Your health care provider may recommend certain vaccines, such as:  Influenza vaccine. This is recommended every year.  Tetanus, diphtheria, and acellular pertussis (Tdap, Td) vaccine. You may need  a Td booster every 10 years.  Zoster vaccine. You may need this after age 37.  Pneumococcal 13-valent conjugate (PCV13) vaccine. One dose is recommended after age 79.  Pneumococcal polysaccharide (PPSV23) vaccine. One dose is recommended after age  33. Talk to your health care provider about which screenings and vaccines you need and how often you need them. This information is not intended to replace advice given to you by your health care provider. Make sure you discuss any questions you have with your health care provider. Document Released: 01/14/2015 Document Revised: 09/07/2015 Document Reviewed: 10/19/2014 Elsevier Interactive Patient Education  2017 [place] Prevention in the Home Falls can cause injuries. They can happen to people of all ages. There are many things you can do to make your home safe and to help prevent falls. What can I do on the outside of my home?  Regularly fix the edges of walkways and driveways and fix any cracks.  Remove anything that might make you trip as you walk through a door, such as a raised step or threshold.  Trim any bushes or trees on the path to your home.  Use bright outdoor lighting.  Clear any walking paths of anything that might make someone trip, such as rocks or tools.  Regularly check to see if handrails are loose or broken. Make sure that both sides of any steps have handrails.  Any raised decks and porches should have guardrails on the edges.  Have any leaves, snow, or ice cleared regularly.  Use sand or salt on walking paths during winter.  Clean up any spills in your garage right away. This includes oil or grease spills. What can I do in the bathroom?  Use night lights.  Install grab bars by the toilet and in the tub and shower. Do not use towel bars as grab bars.  Use non-skid mats or decals in the tub or shower.  If you need to sit down in the shower, use a plastic, non-slip stool.  Keep the floor dry. Clean up any water that spills on the floor as soon as it happens.  Remove soap buildup in the tub or shower regularly.  Attach bath mats securely with double-sided non-slip rug tape.  Do not have throw rugs and other things on the floor that can make  you trip. What can I do in the bedroom?  Use night lights.  Make sure that you have a light by your bed that is easy to reach.  Do not use any sheets or blankets that are too big for your bed. They should not hang down onto the floor.  Have a firm chair that has side arms. You can use this for support while you get dressed.  Do not have throw rugs and other things on the floor that can make you trip. What can I do in the kitchen?  Clean up any spills right away.  Avoid walking on wet floors.  Keep items that you use a lot in easy-to-reach places.  If you need to reach something above you, use a strong step stool that has a grab bar.  Keep electrical cords out of the way.  Do not use floor polish or wax that makes floors slippery. If you must use wax, use non-skid floor wax.  Do not have throw rugs and other things on the floor that can make you trip. What can I do with my stairs?  Do not leave any items on the  stairs.  Make sure that there are handrails on both sides of the stairs and use them. Fix handrails that are broken or loose. Make sure that handrails are as long as the stairways.  Check any carpeting to make sure that it is firmly attached to the stairs. Fix any carpet that is loose or worn.  Avoid having throw rugs at the top or bottom of the stairs. If you do have throw rugs, attach them to the floor with carpet tape.  Make sure that you have a light switch at the top of the stairs and the bottom of the stairs. If you do not have them, ask someone to add them for you. What else can I do to help prevent falls?  Wear shoes that:  Do not have high heels.  Have rubber bottoms.  Are comfortable and fit you well.  Are closed at the toe. Do not wear sandals.  If you use a stepladder:  Make sure that it is fully opened. Do not climb a closed stepladder.  Make sure that both sides of the stepladder are locked into place.  Ask someone to hold it for you, if  possible.  Clearly mark and make sure that you can see:  Any grab bars or handrails.  First and last steps.  Where the edge of each step is.  Use tools that help you move around (mobility aids) if they are needed. These include:  Canes.  Walkers.  Scooters.  Crutches.  Turn on the lights when you go into a dark area. Replace any light bulbs as soon as they burn out.  Set up your furniture so you have a clear path. Avoid moving your furniture around.  If any of your floors are uneven, fix them.  If there are any pets around you, be aware of where they are.  Review your medicines with your doctor. Some medicines can make you feel dizzy. This can increase your chance of falling. Ask your doctor what other things that you can do to help prevent falls. This information is not intended to replace advice given to you by your health care provider. Make sure you discuss any questions you have with your health care provider. Document Released: 10/14/2008 Document Revised: 05/26/2015 Document Reviewed: 01/22/2014 Elsevier Interactive Patient Education  2017 [employer].

## 2019-12-31 NOTE — Progress Notes (Signed)
This service is provided via telemedicine  No vital signs collected/recorded due to the encounter was a telemedicine visit.   Location of patient (ex: home, work): Home  Patient consents to a telephone visit:  Yes, see encounter dated 12/31/2019  Location of the provider (ex: office, home): [facility]  Name of any referring provider:  Bufford Spikes, DO  Names of all persons participating in the telemedicine service and their role in the encounter:  Abbey Chatters, Nurse Practitioner, Elveria Royals, CMA, and patient.   Time spent on call:  10 minutes with medical assistant

## 2020-01-15 ENCOUNTER — Other Ambulatory Visit: Payer: Self-pay

## 2020-01-15 MED ORDER — BUDESONIDE 3 MG PO CPEP
9.0000 mg | ORAL_CAPSULE | Freq: Every day | ORAL | 1 refills | Status: DC | PRN
Start: 2020-01-15 — End: 2020-07-20

## 2020-01-15 NOTE — Telephone Encounter (Signed)
We received a fax from the [facility] for patient's budesonide. She said she only takes it prn and that they called her and they had a good price on them so she wanted them. She said she is doing well and will call us if she has any problems.The form has been signed by Dr Carlean Purl and faxed back to them at fax # [phone], their phone # is [phone].

## 2020-01-27 ENCOUNTER — Other Ambulatory Visit: Payer: Self-pay | Admitting: *Deleted

## 2020-01-27 DIAGNOSIS — J301 Allergic rhinitis due to pollen: Secondary | ICD-10-CM

## 2020-01-27 MED ORDER — FLUTICASONE PROPIONATE 50 MCG/ACT NA SUSP: 1.0000 | Freq: Every day | NASAL | 1 refills | Status: DC | PRN

## 2020-01-27 NOTE — Telephone Encounter (Signed)
Patient requested refill

## 2020-02-22 ENCOUNTER — Encounter: Payer: Self-pay | Admitting: Internal Medicine

## 2020-03-01 ENCOUNTER — Telehealth: Payer: Self-pay | Admitting: *Deleted

## 2020-03-01 NOTE — Telephone Encounter (Signed)
Patient called requesting labwork to be done before next appointment on Wednesday 3/9. Stated that she would like to know what her Calcium, Vitamin D and Vitamin B12 is.  Do you want this done before her appointment or after you see her.  Please Advise.

## 2020-03-01 NOTE — Telephone Encounter (Signed)
Printed to take to [facility] to add to lab.

## 2020-03-01 NOTE — Telephone Encounter (Signed)
Ok for her to have them for b12 deficiency, vitamin D deficiency, and senile osteoporosis.  The calcium can be done through a BMP.  Please notify Nira Conn

## 2020-03-02 NOTE — Telephone Encounter (Addendum)
Lab order will be given to [facility], here at [facility], for patient to have labs done here at [facility] tomorrow 03/03/2020.  LMOM with detailed message for patient.

## 2020-03-03 DIAGNOSIS — E559 Vitamin D deficiency, unspecified: Secondary | ICD-10-CM | POA: Diagnosis not present

## 2020-03-03 DIAGNOSIS — E538 Deficiency of other specified B group vitamins: Secondary | ICD-10-CM | POA: Diagnosis not present

## 2020-03-03 DIAGNOSIS — M810 Age-related osteoporosis without current pathological fracture: Secondary | ICD-10-CM | POA: Diagnosis not present

## 2020-03-03 LAB — BASIC METABOLIC PANEL
BUN: 21 (ref 4–21)
CO2: 22 (ref 13–22)
Chloride: 99 (ref 99–108)
Creatinine: 0.9 (ref 0.5–1.1)
Glucose: 93
Potassium: 4.6 (ref 3.4–5.3)
Sodium: 132 — AB (ref 137–147)

## 2020-03-03 LAB — VITAMIN B12: Vitamin B-12: >2000

## 2020-03-03 LAB — VITAMIN D 25 HYDROXY (VIT D DEFICIENCY, FRACTURES): Vit D, 25-Hydroxy: 54.8

## 2020-03-03 LAB — COMPREHENSIVE METABOLIC PANEL: Calcium: 10.3 (ref 8.7–10.7)

## 2020-03-07 ENCOUNTER — Telehealth: Payer: Self-pay

## 2020-03-07 NOTE — Telephone Encounter (Signed)
FYI  Patient called to ask that we remind Dr. Mariea Clonts to bring equipment for ear wax removal to her up coming appointment

## 2020-03-08 ENCOUNTER — Encounter: Payer: Self-pay | Admitting: *Deleted

## 2020-03-09 ENCOUNTER — Encounter: Payer: Self-pay | Admitting: Internal Medicine

## 2020-03-09 ENCOUNTER — Other Ambulatory Visit: Payer: Self-pay

## 2020-03-09 ENCOUNTER — Non-Acute Institutional Stay: Payer: PPO | Admitting: Internal Medicine

## 2020-03-09 VITALS — BP 128/68 | HR 77 | Temp 97.2°F | Ht 62.0 in | Wt 114.2 lb

## 2020-03-09 DIAGNOSIS — I493 Ventricular premature depolarization: Secondary | ICD-10-CM

## 2020-03-09 DIAGNOSIS — I10 Essential (primary) hypertension: Secondary | ICD-10-CM | POA: Diagnosis not present

## 2020-03-09 DIAGNOSIS — M353 Polymyalgia rheumatica: Secondary | ICD-10-CM | POA: Diagnosis not present

## 2020-03-09 DIAGNOSIS — E210 Primary hyperparathyroidism: Secondary | ICD-10-CM | POA: Diagnosis not present

## 2020-03-09 DIAGNOSIS — M48061 Spinal stenosis, lumbar region without neurogenic claudication: Secondary | ICD-10-CM

## 2020-03-09 DIAGNOSIS — K52831 Collagenous colitis: Secondary | ICD-10-CM | POA: Diagnosis not present

## 2020-03-09 NOTE — Progress Notes (Signed)
Location:  Occupational psychologist of Service:  Clinic (12)  Provider: Hiliana Eilts L. Mariea Clonts, D.O., C.M.D.  Code Status: DNR, MOST Goals of Care:  Advanced Directives 12/31/2019  Does Patient Have a Medical Advance Directive? Yes  Type of Paramedic of [place];Out of facility DNR (pink MOST or yellow form)  Does patient want to make changes to medical advance directive? No - Patient declined  Copy of [place] in Chart? -  Pre-existing out of facility DNR order (yellow form or pink MOST form) Yellow form placed in chart (order not valid for inpatient use)     Chief Complaint  Patient presents with  . Medical Management of Chronic Issues    Medical Management of Chronic Issues. 4 Month Follow up. Requesting a referral to Dr. Daneen Schick (Cardiologist)    HPI: Patient is a 87 y.o. female seen today for medical management of chronic diseases.    She's been feeling so well that she's considering going back to exercise with Robin.  Has had some back pain.    Has Fuchs dystrophy.  Some blurriness.  She attended Dr. Thompson Caul talk about hypertension, healthy lifestyles.  She was impressed with him.    When she's been to the clinic nurse, she has trouble getting her pulse sometimes and other times normal.    She no longer feels as tired just before her B12 injections.    Bowels--gets flare-ups occasionally.  entocort as needed for her is working.  Will do 3 + days until she feels ok.  If she continues, then she gets constipated.    Past Medical History:  Diagnosis Date  . Allergy   . Arthritis   . Back pain   . Blepharitis   . Cancer ([place])    basal cell on face  . Candidal esophagitis ([place]) 2010  . Cataract   . Chest pain, atypical   . Collagenous colitis    Dr. Maurene Capes  . Diverticulosis   . Fuchs' corneal dystrophy   . GERD (gastroesophageal reflux disease)   . Heart murmur   . Hiatal hernia 2010  . History of  fibromyalgia   . Hypercalcemia   . Hyperlipidemia   . Hypertension   . IBS (irritable bowel syndrome)   . Insomnia   . Osteoporosis   . Primary hyperparathyroidism ([place]) 03/11/2017  . Rectocele   . Scoliosis   . Shingles 1975   at waistline, no chronic pain.    Marland Kitchen Spinal stenosis   . Vaginal prolapse     Past Surgical History:  Procedure Laterality Date  . ABDOMINAL HYSTERECTOMY    . BREAST BIOPSY  1964   left benign  . CARDIOVASCULAR STRESS TEST  12/03/2007   EF 72%  . CATARACT EXTRACTION W/ INTRAOCULAR LENS  IMPLANT, BILATERAL    . COLONOSCOPY  04/24/2006   diverticulosis  . FLEXIBLE SIGMOIDOSCOPY  07/27/2010   diverticulosis, collagenous colitis  . TONSILLECTOMY AND ADENOIDECTOMY    . UPPER GASTROINTESTINAL ENDOSCOPY  01/29/2008   esophagitis, hiatal hernia, gastritis  . US ECHOCARDIOGRAPHY  10/30/2007    EF 55-60%    Allergies  Allergen Reactions  . Actonel [Risedronate Sodium] Other (See Comments)    Joint pain  . Amoxicillin Diarrhea  . Erythromycin Other (See Comments)    Gi upset  . Lipitor [Atorvastatin Calcium]   . Zetia [Ezetimibe]     Joint pain    Outpatient Encounter Medications as of 03/09/2020  Medication Sig  .  acetaminophen (TYLENOL) 500 MG tablet Take 500 mg by mouth 3 (three) times daily as needed.  . budesonide (ENTOCORT EC) 3 MG 24 hr capsule Take 3 capsules (9 mg total) by mouth daily as needed.  . Cannabidiol POWD by Does not apply route. This is a liquid roll on  . carbamide peroxide (DEBROX) 6.5 % OTIC solution Place 5 drops into the left ear at bedtime. For one week  . cyanocobalamin (,VITAMIN B-12,) 1000 MCG/ML injection Inject 1 mL (1,000 mcg total) into the muscle every 30 (thirty) days.  Marland Kitchen denosumab (PROLIA) 60 MG/ML SOSY injection Inject 60 mg into the skin every 6 (six) months.  Marland Kitchen estradiol (ESTRACE) 0.1 MG/GM vaginal cream Place 1 Applicatorful vaginally at bedtime.  . famotidine (PEPCID) 40 MG tablet Take 40 mg by mouth daily.  .  flurazepam (DALMANE) 15 MG capsule Take 1 capsule (15 mg total) by mouth at bedtime as needed for sleep.  . fluticasone (FLONASE) 50 MCG/ACT nasal spray Place 1 spray into both nostrils daily as needed for allergies or rhinitis.  . GuaiFENesin (MUCINEX PO) Take 1 tablet by mouth every 12 (twelve) hours as needed (congestion).   . hydrocortisone 2.5 % cream Apply topically 2 (two) times daily.  Marland Kitchen ipratropium (ATROVENT) 0.03 % nasal spray Place 2 sprays into both nostrils 2 (two) times daily as needed for rhinitis. As directed nasal spray  . Melatonin 5 MG TABS Take 1 tablet by mouth at bedtime as needed (Takes an additional as needed when wakes up).   . naproxen sodium (ALEVE) 220 MG tablet Take 220 mg by mouth as needed.  . Vitamin D, Ergocalciferol, (DRISDOL) 1.25 MG (50000 UNIT) CAPS capsule Take 1 capsule (50,000 Units total) by mouth every 7 (seven) days.   No facility-administered encounter medications on file as of 03/09/2020.    Review of Systems:  Review of Systems  Constitutional: Positive for malaise/fatigue. Negative for chills and fever.  HENT: Positive for hearing loss. Negative for congestion and sore throat.   Eyes: Negative for blurred vision.  Respiratory: Negative for shortness of breath.   Cardiovascular: Negative for chest pain.  Gastrointestinal: Positive for constipation and diarrhea. Negative for abdominal pain, blood in stool and melena.  Genitourinary: Negative for dysuria.  Musculoskeletal: Positive for back pain. Negative for falls.  Skin: Negative for itching and rash.  Neurological: Negative for dizziness and loss of consciousness.  Endo/Heme/Allergies: Bruises/bleeds easily.  Psychiatric/Behavioral: Negative for depression and memory loss. The patient has insomnia. The patient is not nervous/anxious.     Health Maintenance  Topic Date Due  . TETANUS/TDAP  01/02/2020  . MAMMOGRAM  09/20/2020  . DEXA SCAN  09/20/2021  . INFLUENZA VACCINE  Completed  .  COVID-19 Vaccine  Completed  . PNA vac Low Risk Adult  Completed  . HPV VACCINES  Aged Out    Physical Exam: Vitals:   03/09/20 1437  BP: 128/68  Pulse: 77  Temp: (!) 97.2 F (36.2 C)  TempSrc: Skin  SpO2: 97%  Weight: 114 lb 3.2 oz (51.8 kg)  Height: 5\' 2"  (1.575 m)   Body mass index is 20.89 kg/m. Physical Exam Vitals reviewed.  Constitutional:      General: She is not in acute distress.    Appearance: Normal appearance. She is not toxic-appearing.  HENT:     Head: Normocephalic and atraumatic.     Right Ear: Tympanic membrane, ear canal and external ear normal. There is no impacted cerumen.     Left  Ear: Tympanic membrane, ear canal and external ear normal. There is no impacted cerumen.  Cardiovascular:     Rate and Rhythm: Normal rate and regular rhythm.     Pulses: Normal pulses.     Heart sounds: Murmur heard.    Pulmonary:     Effort: Pulmonary effort is normal.     Breath sounds: Normal breath sounds. No wheezing, rhonchi or rales.  Abdominal:     General: Bowel sounds are normal.  Musculoskeletal:        General: Normal range of motion.     Right lower leg: No edema.     Left lower leg: No edema.  Neurological:     General: No focal deficit present.     Mental Status: She is alert and oriented to person, place, and time.  Psychiatric:        Mood and Affect: Mood normal.     Labs reviewed: Basic Metabolic Panel: Recent Labs    03/03/20 0000  NA 132*  K 4.6  CL 99  CO2 22  BUN 21  CREATININE 0.9  CALCIUM 10.3   Liver Function Tests: No results for input(s): AST, ALT, ALKPHOS, BILITOT, PROT, ALBUMIN in the last 8760 hours. No results for input(s): LIPASE, AMYLASE in the last 8760 hours. No results for input(s): AMMONIA in the last 8760 hours. CBC: No results for input(s): WBC, NEUTROABS, HGB, HCT, MCV, PLT in the last 8760 hours. Lipid Panel: No results for input(s): CHOL, HDL, LDLCALC, TRIG, CHOLHDL, LDLDIRECT in the last 8760 hours. Lab  Results  Component Value Date   HGBA1C 5.4 09/23/2017    Procedures since last visit: No results found.  Assessment/Plan 1. Benign essential hypertension -bp controlled without meds - Ambulatory referral to Cardiology  2. PVC's (premature ventricular contractions) - also has mitral regurgitation -requests evaluation by Dr. Tamala Julian - Ambulatory referral to Cardiology  3. Polymyalgia rheumatica ([place]) -has been successfully weaned from prednisone  4. Primary hyperparathyroidism ([place]) -continue to monitor PTH, calcium   5. 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 Collagenous colitis -cont entocort when needed for flares  Labs/tests ordered:   No new written today  Next appt:  4 mos med mgt  Jair Lindblad L. Brittley Regner, D.O. [employer] [address], [place] [address] Cell Phone (Mon-Fri 8am-5pm):  [phone] On Call:  [phone] & follow prompts after 5pm & weekends Office Phone:  [phone] Office Fax:  [phone]

## 2020-03-10 ENCOUNTER — Other Ambulatory Visit: Payer: Self-pay | Admitting: Internal Medicine

## 2020-03-10 ENCOUNTER — Telehealth: Payer: Self-pay | Admitting: Cardiology

## 2020-03-10 DIAGNOSIS — M810 Age-related osteoporosis without current pathological fracture: Secondary | ICD-10-CM

## 2020-03-10 NOTE — Telephone Encounter (Signed)
Left message on voicemail for patient to return call when available   

## 2020-03-10 NOTE — Telephone Encounter (Signed)
[name] is requesting a Provider switch from Dr. Stanford Breed to Dr. Tamala Julian. Please advise.

## 2020-03-10 NOTE — Telephone Encounter (Signed)
Does not make sense, I am slowing down..  My recommendation is no switch.

## 2020-03-10 NOTE — Telephone Encounter (Signed)
Will send to Dr.Reed to clarify if we are administering Prolia injection as I was unable to find the last administration date.

## 2020-03-16 DIAGNOSIS — L659 Nonscarring hair loss, unspecified: Secondary | ICD-10-CM | POA: Diagnosis not present

## 2020-03-16 DIAGNOSIS — L821 Other seborrheic keratosis: Secondary | ICD-10-CM | POA: Diagnosis not present

## 2020-03-16 DIAGNOSIS — Z85068 Personal history of other malignant neoplasm of small intestine: Secondary | ICD-10-CM | POA: Diagnosis not present

## 2020-03-16 DIAGNOSIS — L570 Actinic keratosis: Secondary | ICD-10-CM | POA: Diagnosis not present

## 2020-03-16 DIAGNOSIS — L814 Other melanin hyperpigmentation: Secondary | ICD-10-CM | POA: Diagnosis not present

## 2020-03-16 DIAGNOSIS — L853 Xerosis cutis: Secondary | ICD-10-CM | POA: Diagnosis not present

## 2020-03-16 NOTE — Telephone Encounter (Signed)
Left message on voicemail for patient to return call when available   

## 2020-03-16 NOTE — Telephone Encounter (Signed)
I guess [name] never called back about whether she is receiving prolia another way?

## 2020-03-17 NOTE — Telephone Encounter (Signed)
Spoke with patient, patient states she is not sure she is going to proceed with this as she needs Dr.Reed to advise on the benefit of this medication. Patient states " It does not seem to me that this medication has benefited me, no one has clarified that getting this injection is of value."

## 2020-03-17 NOTE — Telephone Encounter (Signed)
Ok, let's remove from her list.

## 2020-03-23 ENCOUNTER — Telehealth: Payer: Self-pay | Admitting: Cardiology

## 2020-03-23 NOTE — Telephone Encounter (Signed)
3.23.22 LVM on home phone to schedule 1 yr fu w/Dr Stanford Breed.LP

## 2020-03-29 DIAGNOSIS — M9902 Segmental and somatic dysfunction of thoracic region: Secondary | ICD-10-CM | POA: Diagnosis not present

## 2020-03-29 DIAGNOSIS — M9901 Segmental and somatic dysfunction of cervical region: Secondary | ICD-10-CM | POA: Diagnosis not present

## 2020-03-29 DIAGNOSIS — M546 Pain in thoracic spine: Secondary | ICD-10-CM | POA: Diagnosis not present

## 2020-03-29 DIAGNOSIS — M62830 Muscle spasm of back: Secondary | ICD-10-CM | POA: Diagnosis not present

## 2020-03-31 DIAGNOSIS — M9901 Segmental and somatic dysfunction of cervical region: Secondary | ICD-10-CM | POA: Diagnosis not present

## 2020-03-31 DIAGNOSIS — M546 Pain in thoracic spine: Secondary | ICD-10-CM | POA: Diagnosis not present

## 2020-03-31 DIAGNOSIS — M9902 Segmental and somatic dysfunction of thoracic region: Secondary | ICD-10-CM | POA: Diagnosis not present

## 2020-03-31 DIAGNOSIS — M62830 Muscle spasm of back: Secondary | ICD-10-CM | POA: Diagnosis not present

## 2020-04-04 DIAGNOSIS — M546 Pain in thoracic spine: Secondary | ICD-10-CM | POA: Diagnosis not present

## 2020-04-04 DIAGNOSIS — M9901 Segmental and somatic dysfunction of cervical region: Secondary | ICD-10-CM | POA: Diagnosis not present

## 2020-04-04 DIAGNOSIS — M62830 Muscle spasm of back: Secondary | ICD-10-CM | POA: Diagnosis not present

## 2020-04-04 DIAGNOSIS — M9902 Segmental and somatic dysfunction of thoracic region: Secondary | ICD-10-CM | POA: Diagnosis not present

## 2020-04-06 ENCOUNTER — Encounter: Payer: PPO | Admitting: Internal Medicine

## 2020-04-06 DIAGNOSIS — M9902 Segmental and somatic dysfunction of thoracic region: Secondary | ICD-10-CM | POA: Diagnosis not present

## 2020-04-06 DIAGNOSIS — M9901 Segmental and somatic dysfunction of cervical region: Secondary | ICD-10-CM | POA: Diagnosis not present

## 2020-04-06 DIAGNOSIS — M546 Pain in thoracic spine: Secondary | ICD-10-CM | POA: Diagnosis not present

## 2020-04-06 DIAGNOSIS — M62830 Muscle spasm of back: Secondary | ICD-10-CM | POA: Diagnosis not present

## 2020-04-11 DIAGNOSIS — M546 Pain in thoracic spine: Secondary | ICD-10-CM | POA: Diagnosis not present

## 2020-04-11 DIAGNOSIS — M9901 Segmental and somatic dysfunction of cervical region: Secondary | ICD-10-CM | POA: Diagnosis not present

## 2020-04-11 DIAGNOSIS — M9902 Segmental and somatic dysfunction of thoracic region: Secondary | ICD-10-CM | POA: Diagnosis not present

## 2020-04-11 DIAGNOSIS — M62830 Muscle spasm of back: Secondary | ICD-10-CM | POA: Diagnosis not present

## 2020-04-14 DIAGNOSIS — M9902 Segmental and somatic dysfunction of thoracic region: Secondary | ICD-10-CM | POA: Diagnosis not present

## 2020-04-14 DIAGNOSIS — M546 Pain in thoracic spine: Secondary | ICD-10-CM | POA: Diagnosis not present

## 2020-04-14 DIAGNOSIS — M9901 Segmental and somatic dysfunction of cervical region: Secondary | ICD-10-CM | POA: Diagnosis not present

## 2020-04-14 DIAGNOSIS — M62830 Muscle spasm of back: Secondary | ICD-10-CM | POA: Diagnosis not present

## 2020-04-18 DIAGNOSIS — M9901 Segmental and somatic dysfunction of cervical region: Secondary | ICD-10-CM | POA: Diagnosis not present

## 2020-04-18 DIAGNOSIS — M9902 Segmental and somatic dysfunction of thoracic region: Secondary | ICD-10-CM | POA: Diagnosis not present

## 2020-04-18 DIAGNOSIS — M62830 Muscle spasm of back: Secondary | ICD-10-CM | POA: Diagnosis not present

## 2020-04-18 DIAGNOSIS — M546 Pain in thoracic spine: Secondary | ICD-10-CM | POA: Diagnosis not present

## 2020-04-20 ENCOUNTER — Non-Acute Institutional Stay: Payer: PPO | Admitting: Internal Medicine

## 2020-04-20 ENCOUNTER — Encounter: Payer: Self-pay | Admitting: Internal Medicine

## 2020-04-20 ENCOUNTER — Telehealth: Payer: Self-pay | Admitting: Internal Medicine

## 2020-04-20 ENCOUNTER — Other Ambulatory Visit: Payer: Self-pay

## 2020-04-20 VITALS — BP 138/70 | HR 74 | Temp 96.9°F | Ht 62.0 in | Wt 113.0 lb

## 2020-04-20 DIAGNOSIS — H6692 Otitis media, unspecified, left ear: Secondary | ICD-10-CM | POA: Diagnosis not present

## 2020-04-20 DIAGNOSIS — I10 Essential (primary) hypertension: Secondary | ICD-10-CM

## 2020-04-20 DIAGNOSIS — E538 Deficiency of other specified B group vitamins: Secondary | ICD-10-CM | POA: Diagnosis not present

## 2020-04-20 DIAGNOSIS — I493 Ventricular premature depolarization: Secondary | ICD-10-CM | POA: Diagnosis not present

## 2020-04-20 DIAGNOSIS — M48061 Spinal stenosis, lumbar region without neurogenic claudication: Secondary | ICD-10-CM

## 2020-04-20 DIAGNOSIS — M353 Polymyalgia rheumatica: Secondary | ICD-10-CM | POA: Diagnosis not present

## 2020-04-20 DIAGNOSIS — N760 Acute vaginitis: Secondary | ICD-10-CM

## 2020-04-20 DIAGNOSIS — K52831 Collagenous colitis: Secondary | ICD-10-CM

## 2020-04-20 DIAGNOSIS — E210 Primary hyperparathyroidism: Secondary | ICD-10-CM | POA: Diagnosis not present

## 2020-04-20 MED ORDER — OFLOXACIN 0.3 % OP SOLN: 1.0000 [drp] | Freq: Four times a day (QID) | OPHTHALMIC | 0 refills | Status: DC

## 2020-04-20 MED ORDER — MICONAZOLE NITRATE 2 % VA CREA
1.0000 | TOPICAL_CREAM | Freq: Every day | VAGINAL | 0 refills | Status: DC
Start: 2020-04-20 — End: 2020-05-03

## 2020-04-20 MED ORDER — DEXAMETHASONE SODIUM PHOSPHATE 0.1 % OP SOLN: 1.0000 [drp] | Freq: Two times a day (BID) | OPHTHALMIC | 0 refills | Status: AC

## 2020-04-20 MED ORDER — CIPROFLOXACIN-DEXAMETHASONE 0.3-0.1 % OT SUSP: 4.0000 [drp] | Freq: Two times a day (BID) | OTIC | 0 refills | Status: DC

## 2020-04-20 NOTE — Patient Instructions (Addendum)
Calling you Ear Drops for Left Ear for 1 week. Don't use Debrox till we see you again. Also Apply Vaginal cream at night till resolve

## 2020-04-20 NOTE — Telephone Encounter (Signed)
Patient called about eardrops with antibiotics and she said the pharmacy's cheapest was $100. The pharmacist said the patient could switch that to eyedrops with the antibiotics and it would work the same and is a lot cheaper. So can Beth Wise call in the patient the eyedrops with antibiotics instead?  If it could be ready by 1:30 tomorrow 04/21/20 that would be best that's when she can get it with [facility]. Patient said she enjoyed her visit and she thinks she is great !

## 2020-04-21 DIAGNOSIS — M546 Pain in thoracic spine: Secondary | ICD-10-CM | POA: Diagnosis not present

## 2020-04-21 DIAGNOSIS — M9902 Segmental and somatic dysfunction of thoracic region: Secondary | ICD-10-CM | POA: Diagnosis not present

## 2020-04-21 DIAGNOSIS — M62830 Muscle spasm of back: Secondary | ICD-10-CM | POA: Diagnosis not present

## 2020-04-21 DIAGNOSIS — M9901 Segmental and somatic dysfunction of cervical region: Secondary | ICD-10-CM | POA: Diagnosis not present

## 2020-04-25 NOTE — Progress Notes (Signed)
Location: [place] of Service:  Clinic (12)  Provider:   Code Status:  Goals of Care:  Advanced Directives 12/31/2019  Does Patient Have a Medical Advance Directive? Yes  Type of Paramedic of [place];Out of facility DNR (pink MOST or yellow form)  Does patient want to make changes to medical advance directive? No - Patient declined  Copy of [place] in Chart? -  Pre-existing out of facility DNR order (yellow form or pink MOST form) Yellow form placed in chart (order not valid for inpatient use)     Chief Complaint  Patient presents with  . Acute Visit    Patient returns to clinic to have itchy ears looked at.     HPI: Patient is a 87 y.o. female seen today for an acute visit for Itchy Ears, Itching in Vaginal Area and Review her Labs especially her B12 level  Patient has h/o Hypertension, PVC, PMR off Prednisone right now, h/o Hyperparathyroidism, Spinal Stenosis with Neurogenic Claudication, Collagenous Colitis  Acute issue today was  C/o Rash in vaginal area with Itching. She uses some kind of oil to hep her dryness and itching in that area  Also c/o Itching in her Ear. Uses otc Deborx whenever she feels it hurts in her ear Also wants to talk about her B12 level.  She is doing good other wise Gait stable No Falls. Lives in [place] in [place]  Past Medical History:  Diagnosis Date  . Allergy   . Arthritis   . Back pain   . Blepharitis   . Cancer ([place])    basal cell on face  . Candidal esophagitis ([place]) 2010  . Cataract   . Chest pain, atypical   . Collagenous colitis    Dr. Maurene Capes  . Diverticulosis   . Fuchs' corneal dystrophy   . GERD (gastroesophageal reflux disease)   . Heart murmur   . Hiatal hernia 2010  . History of fibromyalgia   . Hypercalcemia   . Hyperlipidemia   . Hypertension   . IBS (irritable bowel syndrome)   . Insomnia   . Osteoporosis   . Primary  hyperparathyroidism ([place]) 03/11/2017  . Rectocele   . Scoliosis   . Shingles 1975   at waistline, no chronic pain.    Marland Kitchen Spinal stenosis   . Vaginal prolapse     Past Surgical History:  Procedure Laterality Date  . ABDOMINAL HYSTERECTOMY    . BREAST BIOPSY  1964   left benign  . CARDIOVASCULAR STRESS TEST  12/03/2007   EF 72%  . CATARACT EXTRACTION W/ INTRAOCULAR LENS  IMPLANT, BILATERAL    . COLONOSCOPY  04/24/2006   diverticulosis  . FLEXIBLE SIGMOIDOSCOPY  07/27/2010   diverticulosis, collagenous colitis  . TONSILLECTOMY AND ADENOIDECTOMY    . UPPER GASTROINTESTINAL ENDOSCOPY  01/29/2008   esophagitis, hiatal hernia, gastritis  . US ECHOCARDIOGRAPHY  10/30/2007    EF 55-60%    Allergies  Allergen Reactions  . Actonel [Risedronate Sodium] Other (See Comments)    Joint pain  . Amoxicillin Diarrhea  . Erythromycin Other (See Comments)    Gi upset  . Lipitor [Atorvastatin Calcium]   . Zetia [Ezetimibe]     Joint pain    Outpatient Encounter Medications as of 04/20/2020  Medication Sig  . acetaminophen (TYLENOL) 500 MG tablet Take 500 mg by mouth 3 (three) times daily as needed.  Marland Kitchen BIOTIN PO Take 1,500 mg by mouth daily.  Marland Kitchen  budesonide (ENTOCORT EC) 3 MG 24 hr capsule Take 3 capsules (9 mg total) by mouth daily as needed.  . Cannabidiol POWD by Does not apply route. This is a liquid roll on  . carbamide peroxide (DEBROX) 6.5 % OTIC solution Place 5 drops into the left ear at bedtime. For one week  . cyanocobalamin (,VITAMIN B-12,) 1000 MCG/ML injection Inject 1 mL (1,000 mcg total) into the muscle every 30 (thirty) days.  Marland Kitchen denosumab (PROLIA) 60 MG/ML SOSY injection Inject 60 mg into the skin every 6 (six) months.  . dexamethasone (DECADRON) 0.1 % ophthalmic solution Place 1 drop into the left eye 2 (two) times daily for 7 days. These Are Eye drops but you have to use it for your Left Ear. Do not put in your eyes.  Marland Kitchen estradiol (ESTRACE) 0.1 MG/GM vaginal cream Place 1  Applicatorful vaginally at bedtime.  . famotidine (PEPCID) 40 MG tablet Take 40 mg by mouth daily.  . flurazepam (DALMANE) 15 MG capsule Take 1 capsule (15 mg total) by mouth at bedtime as needed for sleep.  . fluticasone (FLONASE) 50 MCG/ACT nasal spray Place 1 spray into both nostrils daily as needed for allergies or rhinitis.  Marland Kitchen GLUCOSAMINE-CHONDROITIN PO Take 1 tablet by mouth daily.  . GuaiFENesin (MUCINEX PO) Take 1 tablet by mouth every 12 (twelve) hours as needed (congestion).   . hydrocortisone 2.5 % cream Apply topically 2 (two) times daily.  Marland Kitchen ipratropium (ATROVENT) 0.03 % nasal spray Place 2 sprays into both nostrils 2 (two) times daily as needed for rhinitis. As directed nasal spray  . Melatonin 5 MG TABS Take 1 tablet by mouth at bedtime as needed (Takes an additional as needed when wakes up).   . miconazole (MONISTAT 7) 2 % vaginal cream Place 1 Applicatorful vaginally at bedtime.  . Multiple Vitamin (MULTIVITAMIN) capsule Take 1 capsule by mouth daily.  . naproxen sodium (ALEVE) 220 MG tablet Take 220 mg by mouth as needed.  Marland Kitchen ofloxacin (OCUFLOX) 0.3 % ophthalmic solution Place 1 drop into the left ear 4 (four) times daily.  . Vitamin D, Ergocalciferol, (DRISDOL) 1.25 MG (50000 UNIT) CAPS capsule Take 1 capsule (50,000 Units total) by mouth every 7 (seven) days.  . [DISCONTINUED] ciprofloxacin-dexamethasone (CIPRODEX) OTIC suspension Place 4 drops into the left ear 2 (two) times daily.   No facility-administered encounter medications on file as of 04/20/2020.    Review of Systems:  Review of Systems  Constitutional: Negative.   HENT: Positive for ear pain.   Respiratory: Negative.   Cardiovascular: Negative.   Gastrointestinal: Negative.   Genitourinary: Negative.   Musculoskeletal: Positive for arthralgias.  Skin: Negative.   Neurological: Negative.   Psychiatric/Behavioral: Negative.   All other systems reviewed and are negative.   Health Maintenance  Topic Date  Due  . TETANUS/TDAP  01/02/2020  . INFLUENZA VACCINE  08/01/2020  . MAMMOGRAM  09/20/2020  . DEXA SCAN  09/20/2021  . COVID-19 Vaccine  Completed  . PNA vac Low Risk Adult  Completed  . HPV VACCINES  Aged Out    Physical Exam: Vitals:   04/20/20 1036  BP: 138/70  Pulse: 74  Temp: (!) 96.9 F (36.1 C)  SpO2: 98%  Weight: 113 lb (51.3 kg)  Height: 5\' 2"  (1.575 m)   Body mass index is 20.67 kg/m. Physical Exam  Constitutional: Oriented to person, place, and time. Well-developed and well-nourished.  HENT:  Head: Normocephalic. Ears Redness in Left ears with No Wax TM Normal  Mouth/Throat: Oropharynx is clear and moist.  Eyes: Pupils are equal, round, and reactive to light.  Neck: Neck supple.  Cardiovascular: Normal rate and normal heart sounds.   murmur heard. Pulmonary/Chest: Effort normal and breath sounds normal. No respiratory distress. No wheezes. She has no rales.  Abdominal: Soft. Bowel sounds are normal. No distension. There is no tenderness. There is no rebound.  Musculoskeletal: No edema.  Lymphadenopathy: none Neurological: Alert and oriented to person, place, and time.  Skin: Skin is warm and dry.  Psychiatric: Normal mood and affect. Behavior is normal. Thought content normal.    Labs reviewed: Basic Metabolic Panel: Recent Labs    03/03/20 0000  NA 132*  K 4.6  CL 99  CO2 22  BUN 21  CREATININE 0.9  CALCIUM 10.3   Liver Function Tests: No results for input(s): AST, ALT, ALKPHOS, BILITOT, PROT, ALBUMIN in the last 8760 hours. No results for input(s): LIPASE, AMYLASE in the last 8760 hours. No results for input(s): AMMONIA in the last 8760 hours. CBC: No results for input(s): WBC, NEUTROABS, HGB, HCT, MCV, PLT in the last 8760 hours. Lipid Panel: No results for input(s): CHOL, HDL, LDLCALC, TRIG, CHOLHDL, LDLDIRECT in the last 8760 hours. Lab Results  Component Value Date   HGBA1C 5.4 09/23/2017    Procedures since last visit: No results  found.  Assessment/Plan Otitis of left ear Ciprodex Drops for 7 days Do not use OTC Debrox Acute vaginitis OTC Miconazole PRN Vitamin B 12 deficiency Discontinue B 12 shots after her last shot Change it to oral Supplement Repeat levels in 4 months to follow levels Benign essential hypertension Not on any meds PVC's (premature ventricular contractions) Follows with Cardiology Polymyalgia rheumatica ([place]) Off Prednisone Primary hyperparathyroidism ([place])  Monitor Serum Calcium  Spinal stenosis of lumbar region without neurogenic claudication Uses Tylenol PRN Collagenous colitis Uses Entocort PRN Osteoporosis On Prolia Shots ? Dr Luan Pulling Office Will review next visit    Labs/tests ordered:  * No order type specified * Next appt:  04/20/2020 Total time spent in this patient care encounter was  45_  minutes; greater than 50% of the visit spent counseling patient and staff, reviewing records , Labs and coordinating care for problems addressed at this encounter.

## 2020-04-28 DIAGNOSIS — M9902 Segmental and somatic dysfunction of thoracic region: Secondary | ICD-10-CM | POA: Diagnosis not present

## 2020-04-28 DIAGNOSIS — M546 Pain in thoracic spine: Secondary | ICD-10-CM | POA: Diagnosis not present

## 2020-04-28 DIAGNOSIS — M9901 Segmental and somatic dysfunction of cervical region: Secondary | ICD-10-CM | POA: Diagnosis not present

## 2020-04-28 DIAGNOSIS — M62830 Muscle spasm of back: Secondary | ICD-10-CM | POA: Diagnosis not present

## 2020-05-02 ENCOUNTER — Telehealth: Payer: Self-pay | Admitting: Internal Medicine

## 2020-05-02 DIAGNOSIS — M9901 Segmental and somatic dysfunction of cervical region: Secondary | ICD-10-CM | POA: Diagnosis not present

## 2020-05-02 DIAGNOSIS — M62830 Muscle spasm of back: Secondary | ICD-10-CM | POA: Diagnosis not present

## 2020-05-02 DIAGNOSIS — M546 Pain in thoracic spine: Secondary | ICD-10-CM | POA: Diagnosis not present

## 2020-05-02 DIAGNOSIS — M9902 Segmental and somatic dysfunction of thoracic region: Secondary | ICD-10-CM | POA: Diagnosis not present

## 2020-05-02 NOTE — Telephone Encounter (Signed)
Pt called regarding monistat cream that she called in at pharmacy & pt did not pick that medication up bc she has had reaction to monistat in the past & should be documented in her chart.  Beth Wise seen a note in [place] about her B12 level & she would like to talk with someone about that.  She wants Dr Lyndel Safe to be aware of her spinal stenosis & scoliosis. Beth Wise met Dr Vertell Limber [facility] at [place] wants to know if it would be a good idea to get a referral with him?  Please advise on all  Thanks, [name]

## 2020-05-03 ENCOUNTER — Other Ambulatory Visit: Payer: Self-pay | Admitting: Orthopedic Surgery

## 2020-05-03 DIAGNOSIS — N898 Other specified noninflammatory disorders of vagina: Secondary | ICD-10-CM

## 2020-05-03 MED ORDER — CLOTRIMAZOLE 2 % VA CREA: 1.0000 | TOPICAL_CREAM | Freq: Every day | VAGINAL | 0 refills | Status: DC

## 2020-05-03 NOTE — Progress Notes (Signed)
Monistat added to allergy list.

## 2020-05-05 DIAGNOSIS — M9902 Segmental and somatic dysfunction of thoracic region: Secondary | ICD-10-CM | POA: Diagnosis not present

## 2020-05-05 DIAGNOSIS — M9901 Segmental and somatic dysfunction of cervical region: Secondary | ICD-10-CM | POA: Diagnosis not present

## 2020-05-05 DIAGNOSIS — M62830 Muscle spasm of back: Secondary | ICD-10-CM | POA: Diagnosis not present

## 2020-05-05 DIAGNOSIS — M546 Pain in thoracic spine: Secondary | ICD-10-CM | POA: Diagnosis not present

## 2020-05-06 ENCOUNTER — Telehealth: Payer: Self-pay

## 2020-05-06 NOTE — Telephone Encounter (Signed)
Patient called wanting to get Dr. Steve Rattler opinion and/or referral to see Dr. Vertell Limber, the neurologist for her scoliosis and stenosis.  She would also like to know if Dr. Lyndel Safe thinks its okay to wait till the Fall to get her 2nd covid booster or if she should get it now.   To Dr. Lyndel Safe.

## 2020-05-09 NOTE — Telephone Encounter (Signed)
I can make a referal for her to see Dr Vertell Limber. But in My opinion she is not candidate for any Surgical intervention  Probably Steroid Injections in her Spine would be option. Also I would say go ahead and take your 4th Booster shot as cases are rising again.

## 2020-05-10 DIAGNOSIS — M9901 Segmental and somatic dysfunction of cervical region: Secondary | ICD-10-CM | POA: Diagnosis not present

## 2020-05-10 DIAGNOSIS — M62830 Muscle spasm of back: Secondary | ICD-10-CM | POA: Diagnosis not present

## 2020-05-10 DIAGNOSIS — M9902 Segmental and somatic dysfunction of thoracic region: Secondary | ICD-10-CM | POA: Diagnosis not present

## 2020-05-10 DIAGNOSIS — M546 Pain in thoracic spine: Secondary | ICD-10-CM | POA: Diagnosis not present

## 2020-05-10 NOTE — Telephone Encounter (Signed)
Called patient. No answer. LMOM to return call.

## 2020-05-10 NOTE — Telephone Encounter (Signed)
Patient called back. Patient agrees to no surgery, she declined the injections. She just wonders how long and what to look for with this condition. Her posture is her main concern.   Patient would like for you to hold off on the referral for now.   Gave patient [facility] to see if they are offering covid boosters.   To Dr. Lyndel Safe

## 2020-05-13 ENCOUNTER — Ambulatory Visit: Payer: PPO | Attending: Internal Medicine

## 2020-05-13 ENCOUNTER — Other Ambulatory Visit (HOSPITAL_BASED_OUTPATIENT_CLINIC_OR_DEPARTMENT_OTHER): Payer: Self-pay

## 2020-05-13 ENCOUNTER — Other Ambulatory Visit: Payer: Self-pay

## 2020-05-13 DIAGNOSIS — Z23 Encounter for immunization: Secondary | ICD-10-CM

## 2020-05-13 MED ORDER — COVID-19 MRNA VACC (MODERNA) 100 MCG/0.5ML IM SUSP
INTRAMUSCULAR | 0 refills | Status: DC
  Filled 2020-05-13: qty 0.25, 1d supply, fill #0

## 2020-05-13 NOTE — Progress Notes (Signed)
   Covid-19 Vaccination Clinic  Name:  MADELINE PHO    MRN: [id number] DOB: December 22, 1925  05/13/2020  Ms. Buckner was observed post Covid-19 immunization for 15 minutes without incident. She was provided with Vaccine Information Sheet and instruction to access the V-Safe system.   Ms. Breslin was instructed to call 911 with any severe reactions post vaccine: Marland Kitchen Difficulty breathing  . Swelling of face and throat  . A fast heartbeat  . A bad rash all over body  . Dizziness and weakness   Immunizations Administered    Name Date Dose VIS Date Route   Moderna Covid-19 Booster Vaccine 05/13/2020  3:14 PM 0.25 mL 10/21/2019 Intramuscular   Manufacturer: Moderna   Lot: [id number]   [place]: 03546-568-12

## 2020-05-24 DIAGNOSIS — H40053 Ocular hypertension, bilateral: Secondary | ICD-10-CM | POA: Diagnosis not present

## 2020-05-24 DIAGNOSIS — Z961 Presence of intraocular lens: Secondary | ICD-10-CM | POA: Diagnosis not present

## 2020-05-24 DIAGNOSIS — H18513 Endothelial corneal dystrophy, bilateral: Secondary | ICD-10-CM | POA: Diagnosis not present

## 2020-05-24 DIAGNOSIS — H40023 Open angle with borderline findings, high risk, bilateral: Secondary | ICD-10-CM | POA: Diagnosis not present

## 2020-06-07 DIAGNOSIS — Z1589 Genetic susceptibility to other disease: Secondary | ICD-10-CM | POA: Diagnosis not present

## 2020-06-07 DIAGNOSIS — M353 Polymyalgia rheumatica: Secondary | ICD-10-CM | POA: Diagnosis not present

## 2020-06-07 DIAGNOSIS — M542 Cervicalgia: Secondary | ICD-10-CM | POA: Diagnosis not present

## 2020-06-07 DIAGNOSIS — Z6820 Body mass index (BMI) 20.0-20.9, adult: Secondary | ICD-10-CM | POA: Diagnosis not present

## 2020-06-07 DIAGNOSIS — M150 Primary generalized (osteo)arthritis: Secondary | ICD-10-CM | POA: Diagnosis not present

## 2020-06-07 DIAGNOSIS — M48061 Spinal stenosis, lumbar region without neurogenic claudication: Secondary | ICD-10-CM | POA: Diagnosis not present

## 2020-06-07 DIAGNOSIS — K52831 Collagenous colitis: Secondary | ICD-10-CM | POA: Diagnosis not present

## 2020-06-07 NOTE — Progress Notes (Signed)
HPI: FU hypertension. She has a past history of labile hypertension and atypical chest pain. Adenosine Cardiolite December 2009 normal.  Echocardiogram July 2019 showed normal LV function, grade 1 diastolic dysfunction, mild aortic insufficiency and mitral regurgitation.  Since last seen there is no dyspnea, chest pain, palpitations or syncope.  Current Outpatient Medications  Medication Sig Dispense Refill   acetaminophen (TYLENOL) 500 MG tablet Take 500 mg by mouth 3 (three) times daily as needed.     BIOTIN PO Take 1,500 mg by mouth daily.     budesonide (ENTOCORT EC) 3 MG 24 hr capsule Take 3 capsules (9 mg total) by mouth daily as needed. 100 capsule 1   BUDESONIDE PO Take by mouth.     Cannabidiol POWD by Does not apply route. This is a liquid roll on     carbamide peroxide (DEBROX) 6.5 % OTIC solution Place 5 drops into the left ear at bedtime. For one week 15 mL 0   clotrimazole (GYNE-LOTRIMIN 3) 2 % vaginal cream Place 1 Applicatorful vaginally at bedtime. 21 g 0   COVID-19 mRNA vaccine, Moderna, 100 MCG/0.5ML injection Inject into the muscle. 0.25 mL 0   cyanocobalamin (,VITAMIN B-12,) 1000 MCG/ML injection Inject 1 mL (1,000 mcg total) into the muscle every 30 (thirty) days. 3 mL 3   denosumab (PROLIA) 60 MG/ML SOSY injection Inject 60 mg into the skin every 6 (six) months. 180 mL 0   estradiol (ESTRACE) 0.1 MG/GM vaginal cream Place 1 Applicatorful vaginally at bedtime.     famotidine (PEPCID) 40 MG tablet Take 40 mg by mouth daily.     flurazepam (DALMANE) 15 MG capsule Take 1 capsule (15 mg total) by mouth at bedtime as needed for sleep. 90 capsule 1   fluticasone (FLONASE) 50 MCG/ACT nasal spray Place 1 spray into both nostrils daily as needed for allergies or rhinitis. 16 g 1   GLUCOSAMINE-CHONDROITIN PO Take 1 tablet by mouth daily.     GuaiFENesin (MUCINEX PO) Take 1 tablet by mouth every 12 (twelve) hours as needed (congestion).      hydrocortisone 2.5 % cream Apply  topically 2 (two) times daily. 30 g 1   ipratropium (ATROVENT) 0.03 % nasal spray Place 2 sprays into both nostrils 2 (two) times daily as needed for rhinitis. As directed nasal spray 90 mL 1   Melatonin 5 MG TABS Take 1 tablet by mouth at bedtime as needed (Takes an additional as needed when wakes up).      Multiple Vitamin (MULTIVITAMIN) capsule Take 1 capsule by mouth daily.     naproxen sodium (ALEVE) 220 MG tablet Take 220 mg by mouth as needed.     ofloxacin (OCUFLOX) 0.3 % ophthalmic solution Place 1 drop into the left ear 4 (four) times daily. 5 mL 0   Vitamin D, Ergocalciferol, (DRISDOL) 1.25 MG (50000 UNIT) CAPS capsule Take 1 capsule (50,000 Units total) by mouth every 7 (seven) days. 12 capsule 0   No current facility-administered medications for this visit.     Past Medical History:  Diagnosis Date   Allergy    Arthritis    Back pain    Blepharitis    Cancer ([facility])    basal cell on face   Candidal esophagitis ([place]) 2010   Cataract    Chest pain, atypical    Collagenous colitis    Dr. Maurene Capes   Diverticulosis    [place]' corneal dystrophy    GERD (gastroesophageal reflux disease)  Heart murmur    Hiatal hernia 2010   History of fibromyalgia    Hypercalcemia    Hyperlipidemia    Hypertension    IBS (irritable bowel syndrome)    Insomnia    Osteoporosis    Primary hyperparathyroidism ([place]) 03/11/2017   Rectocele    Scoliosis    Shingles 1975   at waistline, no chronic pain.     Spinal stenosis    Vaginal prolapse     Past Surgical History:  Procedure Laterality Date   ABDOMINAL HYSTERECTOMY     BREAST BIOPSY  1964   left benign   CARDIOVASCULAR STRESS TEST  12/03/2007   EF 72%   CATARACT EXTRACTION W/ INTRAOCULAR LENS  IMPLANT, BILATERAL     COLONOSCOPY  04/24/2006   diverticulosis   FLEXIBLE SIGMOIDOSCOPY  07/27/2010   diverticulosis, collagenous colitis   TONSILLECTOMY AND ADENOIDECTOMY     UPPER GASTROINTESTINAL ENDOSCOPY  01/29/2008   esophagitis,  hiatal hernia, gastritis   US ECHOCARDIOGRAPHY  10/30/2007    EF 55-60%    Social History   Socioeconomic History   Marital status: Widowed    Spouse name: Not on file   Number of children: 2   Years of education: Not on file   Highest education level: Not on file  Occupational History   Occupation: retired    Comment: owned Human resources officer  Tobacco Use   Smoking status: Never   Smokeless tobacco: Never  Vaping Use   Vaping Use: Never used  Substance and Sexual Activity   Alcohol use: Not Currently   Drug use: No   Sexual activity: Never  Other Topics Concern   Not on file  Social History Narrative   Social History      Diet? Regular (shouldn't have excessive amts of acid, hot &spicey foods)      Do you drink/eat things with caffeine? yes      Marital status?       widow                             What year were you married? [place] you live in a house, apartment, assisted living, condo, trailer, etc.? apartment      Is it one or more stories? 1      How many persons live in your home? 1 (self)      Do you have any pets in your home? (please list) no no !!      Highest level of education completed?      Current or past profession: hairdresser      Do you exercise?      little                                Type & how often? Leg lifts and plank, stretching every day.      Advanced Directives      Do you have a living will?  yes      Do you have a DNR form?         yes                         If not, do you want to discuss one? yes      Do you have signed POA/HPOA for forms? yes  Functional Status      Do you have difficulty bathing or dressing yourself?      Do you have difficulty preparing food or eating?       Do you have difficulty managing your medications?      Do you have difficulty managing your finances?      Do you have difficulty affording your medications?   Social Determinants of Health   Financial Resource Strain: Not on  file  Food Insecurity: Not on file  Transportation Needs: Not on file  Physical Activity: Not on file  Stress: Not on file  Social Connections: Not on file  Intimate Partner Violence: Not on file    Family History  Problem Relation Age of Onset   Breast cancer Mother        died at 83   Stroke Mother    Heart disease Father        died at 28   Cirrhosis Sister        died at 46   Heart disease Brother        died at 84   Pneumonia Sister        died at 36 months   Heart disease Sister        died at 33   Cancer - Other Sister        stomach; died at 80   Stomach cancer Sister    Heart disease Brother        c-diff and sepsis; died at 40   Colon cancer Neg Hx    Diabetes Neg Hx    Rectal cancer Neg Hx    Esophageal cancer Neg Hx     ROS: no fevers or chills, productive cough, hemoptysis, dysphasia, odynophagia, melena, hematochezia, dysuria, hematuria, rash, seizure activity, orthopnea, PND, pedal edema, claudication. Remaining systems are negative.  Physical Exam: Well-developed well-nourished in no acute distress.  Skin is warm and dry.  HEENT is normal.  Neck is supple.  Chest is clear to auscultation with normal expansion.  Cardiovascular exam is regular rate and rhythm.  Abdominal exam nontender or distended. No masses palpated. Extremities show no edema. neuro grossly intact  ECG-normal sinus rhythm at a rate of 61, first-degree AV block.  Personally reviewed  A/P  1 previous history of hypertension-patient's blood pressure remains normal on no medications.  We will continue to follow.  2 history of aortic insufficiency/mitral regurgitation-mild on most recent echocardiogram.  3 hyperlipidemia-managed by primary care.  Kirk Ruths, MD

## 2020-06-08 DIAGNOSIS — L308 Other specified dermatitis: Secondary | ICD-10-CM | POA: Diagnosis not present

## 2020-06-08 DIAGNOSIS — N952 Postmenopausal atrophic vaginitis: Secondary | ICD-10-CM | POA: Diagnosis not present

## 2020-06-08 DIAGNOSIS — N813 Complete uterovaginal prolapse: Secondary | ICD-10-CM | POA: Diagnosis not present

## 2020-06-14 DIAGNOSIS — L728 Other follicular cysts of the skin and subcutaneous tissue: Secondary | ICD-10-CM | POA: Diagnosis not present

## 2020-06-14 DIAGNOSIS — L821 Other seborrheic keratosis: Secondary | ICD-10-CM | POA: Diagnosis not present

## 2020-06-14 DIAGNOSIS — L298 Other pruritus: Secondary | ICD-10-CM | POA: Diagnosis not present

## 2020-06-14 DIAGNOSIS — D23111 Other benign neoplasm of skin of right upper eyelid, including canthus: Secondary | ICD-10-CM | POA: Diagnosis not present

## 2020-06-14 DIAGNOSIS — D1801 Hemangioma of skin and subcutaneous tissue: Secondary | ICD-10-CM | POA: Diagnosis not present

## 2020-06-21 ENCOUNTER — Other Ambulatory Visit: Payer: Self-pay

## 2020-06-21 ENCOUNTER — Ambulatory Visit: Payer: PPO | Admitting: Cardiology

## 2020-06-21 ENCOUNTER — Encounter: Payer: Self-pay | Admitting: Cardiology

## 2020-06-21 VITALS — BP 148/74 | HR 61 | Ht 62.0 in | Wt 111.2 lb

## 2020-06-21 DIAGNOSIS — I10 Essential (primary) hypertension: Secondary | ICD-10-CM | POA: Diagnosis not present

## 2020-06-21 DIAGNOSIS — E7800 Pure hypercholesterolemia, unspecified: Secondary | ICD-10-CM | POA: Diagnosis not present

## 2020-06-21 DIAGNOSIS — I359 Nonrheumatic aortic valve disorder, unspecified: Secondary | ICD-10-CM

## 2020-06-21 NOTE — Patient Instructions (Signed)

## 2020-06-24 ENCOUNTER — Other Ambulatory Visit: Payer: Self-pay | Admitting: *Deleted

## 2020-06-24 DIAGNOSIS — F419 Anxiety disorder, unspecified: Secondary | ICD-10-CM

## 2020-06-24 NOTE — Telephone Encounter (Signed)
Beth Wise is requesting refill. Stated that patient is calling them requesting refill.  LR per pharmacy 12/23/2019  Pended Rx and sent to Dr. Lyndel Safe for approval.

## 2020-06-27 MED ORDER — FLURAZEPAM HCL 15 MG PO CAPS
15.0000 mg | ORAL_CAPSULE | Freq: Every evening | ORAL | 0 refills | Status: DC | PRN
Start: 2020-06-27 — End: 2021-01-18

## 2020-07-12 IMAGING — DX DG HIP (WITH OR WITHOUT PELVIS) 2V BILAT
4 series · 4 of 4 positions shown · non-contrast
Comparison: None.

CLINICAL DATA: Left hip pain following fall several months ago,
initial encounter

EXAM:
DG HIP (WITH OR WITHOUT PELVIS) 2V BILAT

[pelvis inlet]
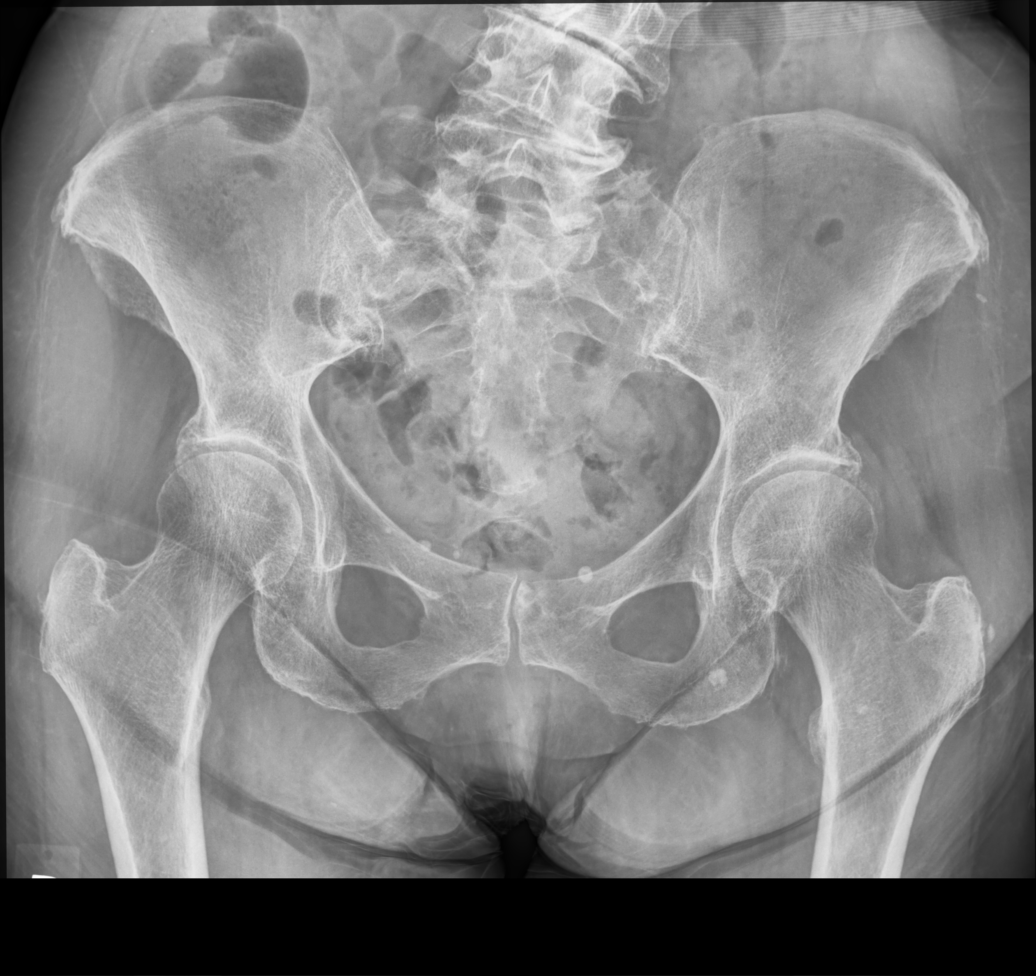

[pelvis outlet]
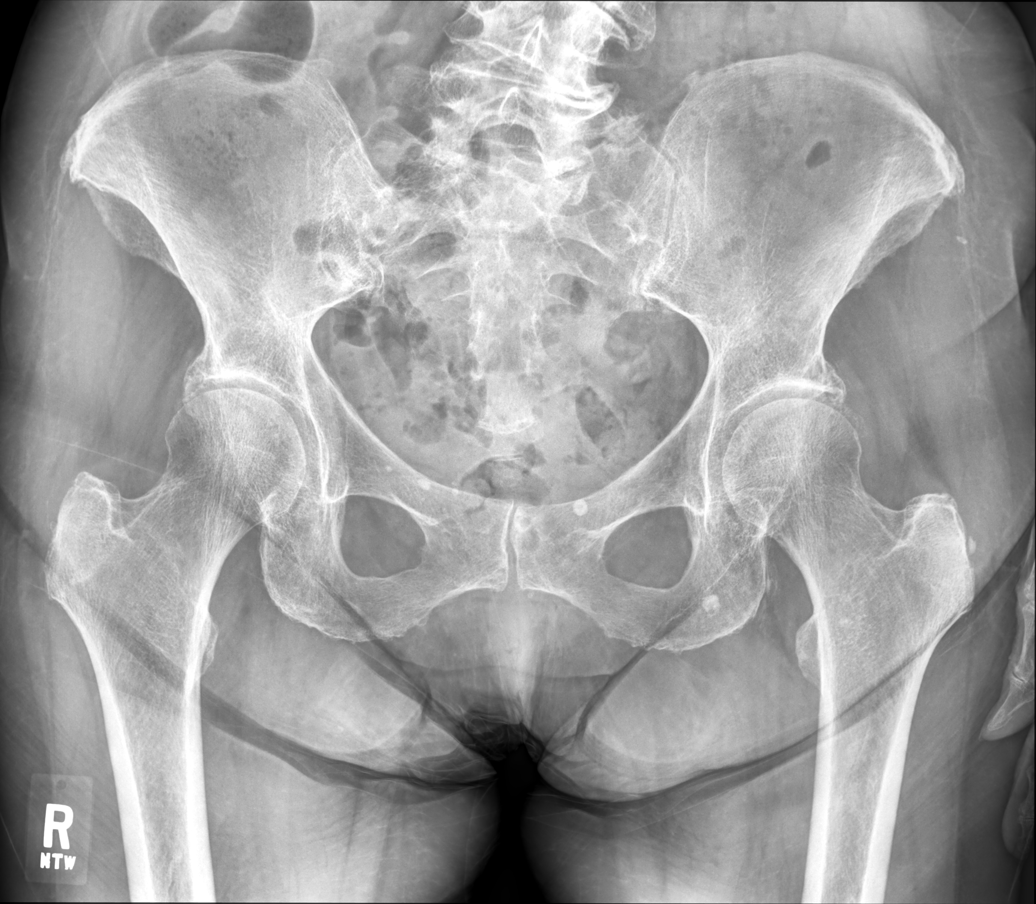

[hip joint (frog view) (1 of 2)]
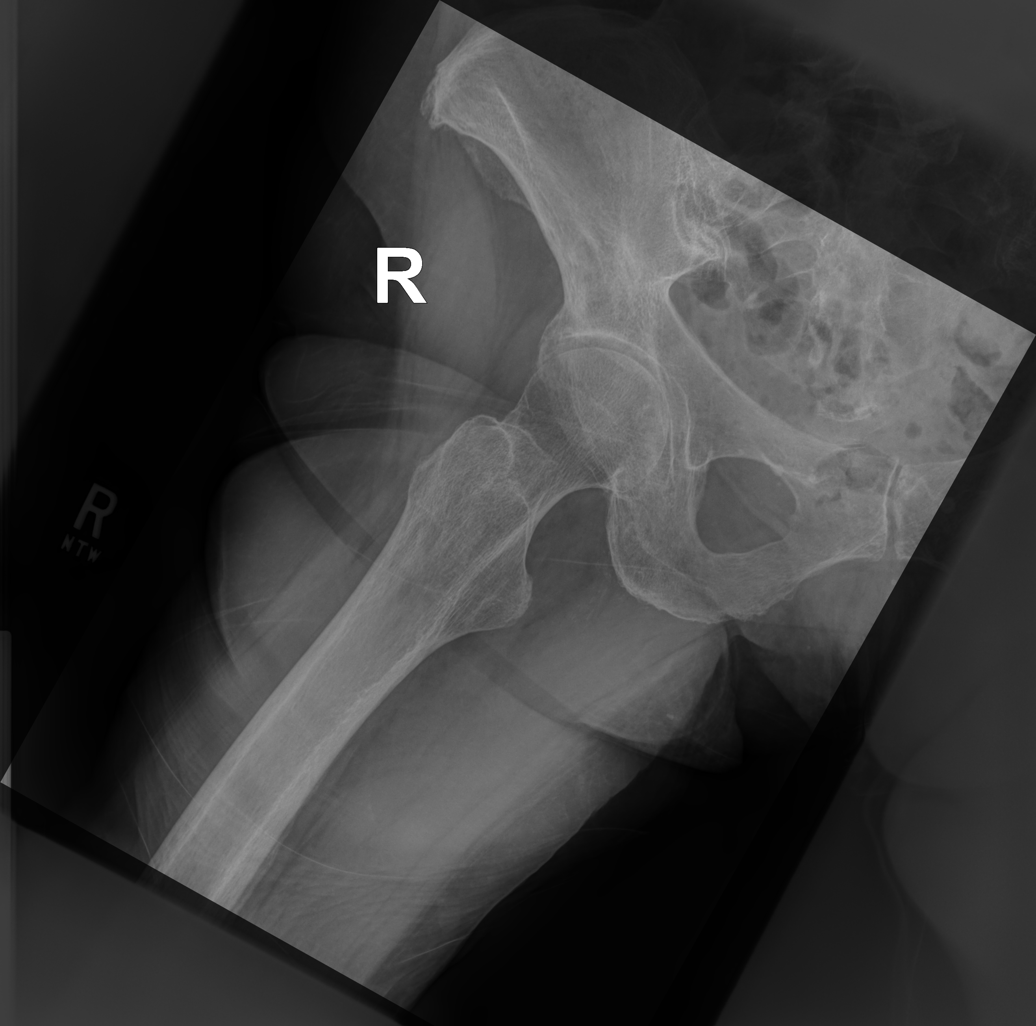

[hip joint (frog view) (2 of 2)]
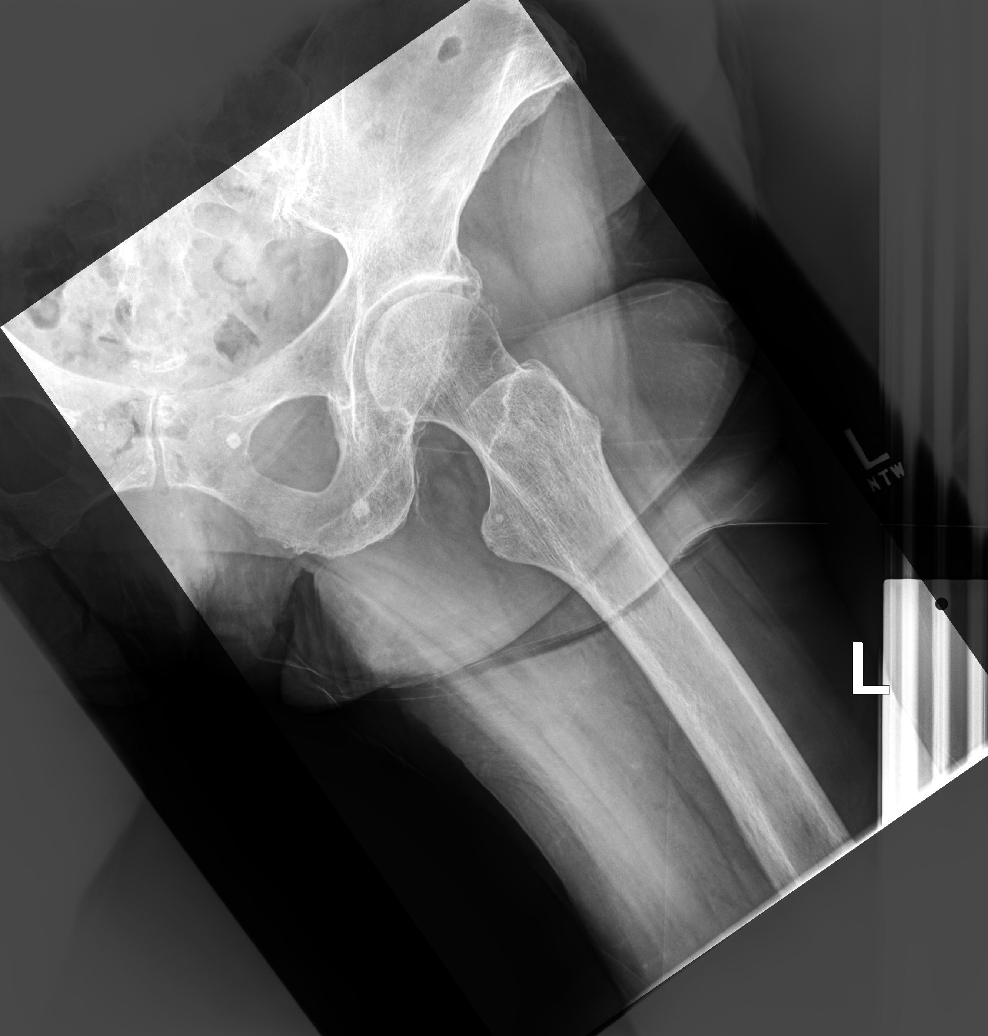

[4 of 4 positions shown; findings below may reference images not displayed]

FINDINGS: Pelvic ring is intact. Degenerative changes of the hip joints and
lumbar spine are seen. Scoliosis concave to the right in the lumbar
spine is noted. No acute fracture or dislocation is seen. No soft
tissue abnormality is noted.
IMPRESSION: Degenerative change without acute abnormality.

## 2020-07-13 ENCOUNTER — Encounter: Payer: Self-pay | Admitting: Internal Medicine

## 2020-07-18 DIAGNOSIS — H40023 Open angle with borderline findings, high risk, bilateral: Secondary | ICD-10-CM | POA: Diagnosis not present

## 2020-07-18 DIAGNOSIS — H40053 Ocular hypertension, bilateral: Secondary | ICD-10-CM | POA: Diagnosis not present

## 2020-07-19 DIAGNOSIS — M546 Pain in thoracic spine: Secondary | ICD-10-CM | POA: Diagnosis not present

## 2020-07-19 DIAGNOSIS — M62830 Muscle spasm of back: Secondary | ICD-10-CM | POA: Diagnosis not present

## 2020-07-19 DIAGNOSIS — M9901 Segmental and somatic dysfunction of cervical region: Secondary | ICD-10-CM | POA: Diagnosis not present

## 2020-07-19 DIAGNOSIS — M9902 Segmental and somatic dysfunction of thoracic region: Secondary | ICD-10-CM | POA: Diagnosis not present

## 2020-07-20 ENCOUNTER — Other Ambulatory Visit (HOSPITAL_BASED_OUTPATIENT_CLINIC_OR_DEPARTMENT_OTHER): Payer: Self-pay

## 2020-07-20 ENCOUNTER — Encounter: Payer: Self-pay | Admitting: Internal Medicine

## 2020-07-20 ENCOUNTER — Other Ambulatory Visit: Payer: Self-pay

## 2020-07-20 ENCOUNTER — Non-Acute Institutional Stay: Payer: PPO | Admitting: Internal Medicine

## 2020-07-20 ENCOUNTER — Other Ambulatory Visit: Payer: Self-pay | Admitting: *Deleted

## 2020-07-20 VITALS — BP 126/88 | HR 76 | Temp 97.7°F | Ht 62.0 in | Wt 111.0 lb

## 2020-07-20 DIAGNOSIS — I10 Essential (primary) hypertension: Secondary | ICD-10-CM

## 2020-07-20 DIAGNOSIS — E210 Primary hyperparathyroidism: Secondary | ICD-10-CM | POA: Diagnosis not present

## 2020-07-20 DIAGNOSIS — H6692 Otitis media, unspecified, left ear: Secondary | ICD-10-CM

## 2020-07-20 DIAGNOSIS — E538 Deficiency of other specified B group vitamins: Secondary | ICD-10-CM

## 2020-07-20 DIAGNOSIS — M353 Polymyalgia rheumatica: Secondary | ICD-10-CM | POA: Diagnosis not present

## 2020-07-20 MED ORDER — FLUTICASONE PROPIONATE 50 MCG/ACT NA SUSP: 1.0000 | Freq: Every day | NASAL | 1 refills | Status: DC | PRN

## 2020-07-20 NOTE — Telephone Encounter (Signed)
[facility] requested refill.  

## 2020-07-20 NOTE — Patient Instructions (Addendum)
Continue with Decadron/ Dexamethasone  twice a day as needed for ears Itching

## 2020-07-21 ENCOUNTER — Other Ambulatory Visit: Payer: Self-pay | Admitting: Internal Medicine

## 2020-07-21 ENCOUNTER — Other Ambulatory Visit: Payer: Self-pay | Admitting: *Deleted

## 2020-07-21 NOTE — Telephone Encounter (Addendum)
Patient called and stated that she was seen yesterday and some refill were not sent to the pharmacy. Patient is requesting.  Patient is requesting an eye drop she uses twice daily for her ears. (Stated it was not the one she uses four times daily but could not remember the name of it) and requesting Hydrocortisone.   Please Advise.

## 2020-07-22 ENCOUNTER — Other Ambulatory Visit: Payer: Self-pay | Admitting: Internal Medicine

## 2020-07-22 ENCOUNTER — Telehealth: Payer: Self-pay

## 2020-07-22 MED ORDER — HYDROCORTISONE 2.5 % EX CREA: TOPICAL_CREAM | Freq: Two times a day (BID) | CUTANEOUS | 1 refills | Status: DC

## 2020-07-22 MED ORDER — HYDROCORTISONE 2.5 % EX OINT: TOPICAL_OINTMENT | Freq: Two times a day (BID) | CUTANEOUS | 1 refills | Status: DC

## 2020-07-22 MED ORDER — DEXAMETHASONE SODIUM PHOSPHATE 0.1 % OP SOLN: 2.0000 [drp] | Freq: Two times a day (BID) | OPHTHALMIC | 0 refills | Status: DC | PRN

## 2020-07-22 MED ORDER — TIMOLOL HEMIHYDRATE 0.5 % OP SOLN: 1.0000 [drp] | Freq: Every day | OPHTHALMIC | 3 refills | Status: DC

## 2020-07-22 NOTE — Progress Notes (Signed)
Ordered New drops. Does not need the other drops which I think is Cipro Ear drops

## 2020-07-22 NOTE — Telephone Encounter (Signed)
Incoming call received from the pharmacy sating patient was present to pick up hydrocortisone rx and noticed it was sent in for an ointment. Patient received a cream in the years past (from a different provider). The pharmacist confirmed this narrative by reviewing refill history.  The pharmacy was confirming that it is ok to change to cream.  Verbal authorization provided, message will be sent to Virgie Dad, MD as Juluis Rainier

## 2020-07-22 NOTE — Telephone Encounter (Signed)
Patient called back again today, stating that she discussed these medications with Dr. Lyndel Safe at appointment and thought she was going to call them in. Stated that she received all the other medications but:  -Hydrocortisone cream (requesting 90 day supply)  -Eye Drop that she uses in her ears twice daily prn. Dexamethasone (Could not remember name but believes it starts with a D, found in medication history).    Please Advise.

## 2020-07-22 NOTE — Progress Notes (Signed)
Location:  [place] of Service:  Clinic (12)  Provider:   Code Status:  Goals of Care:  Advanced Directives 07/20/2020  Does Patient Have a Medical Advance Directive? Yes  Type of Advance Directive Out of facility DNR (pink MOST or yellow form);Healthcare Power of Attorney  Does patient want to make changes to medical advance directive? No - Patient declined  Copy of [place] in Chart? Yes - validated most recent copy scanned in chart (See row information)  Pre-existing out of facility DNR order (yellow form or pink MOST form) Yellow form placed in chart (order not valid for inpatient use)     Chief Complaint  Patient presents with   Medical Management of Chronic Issues    4 month follow up.   Health Maintenance    TDAP    HPI: Patient is a 87 y.o. female seen today for medical management of chronic diseases.    Patient has h/o Hypertension, PVC, PMR off Prednisone right now, h/o Hyperparathyroidism, Spinal Stenosis with Neurogenic Claudication, Collagenous Colitis Vit B12 Def Mild AR AI per cardiology Collagenous colitis uses budesonide as needed  Her active problems today Osteoporosis Patient missed her dose of Prolia on March.  She states that she has been on it for long time and does not want to take it anymore Itching in left ear Started using the eardrops that I had given her before Cipro and dexamethasone Her symptoms got better.  She pulled out ?  clot / Wax from her ear.   B12 deficiency Has started taking sublingual B12 now Vaginal atrophy Is on estrogen cream per GYN  Continues to be active Did have a fall few days ago seems more like a mechanical fall.  Did not sustain any injuries.   Past Medical History:  Diagnosis Date   Allergy    Arthritis    Back pain    Blepharitis    Cancer (HCC)    basal cell on face   Candidal esophagitis ([place]) 2010   Cataract    Chest pain, atypical    Collagenous  colitis    Dr. Maurene Capes   Diverticulosis    Vira Agar' corneal dystrophy    GERD (gastroesophageal reflux disease)    Heart murmur    Hiatal hernia 2010   History of fibromyalgia    Hypercalcemia    Hyperlipidemia    Hypertension    IBS (irritable bowel syndrome)    Insomnia    Osteoporosis    Primary hyperparathyroidism ([place]) 03/11/2017   Rectocele    Scoliosis    Shingles 1975   at waistline, no chronic pain.     Spinal stenosis    Vaginal prolapse     Past Surgical History:  Procedure Laterality Date   ABDOMINAL HYSTERECTOMY     BREAST BIOPSY  1964   left benign   CARDIOVASCULAR STRESS TEST  12/03/2007   EF 72%   CATARACT EXTRACTION W/ INTRAOCULAR LENS  IMPLANT, BILATERAL     COLONOSCOPY  04/24/2006   diverticulosis   FLEXIBLE SIGMOIDOSCOPY  07/27/2010   diverticulosis, collagenous colitis   TONSILLECTOMY AND ADENOIDECTOMY     UPPER GASTROINTESTINAL ENDOSCOPY  01/29/2008   esophagitis, hiatal hernia, gastritis   US ECHOCARDIOGRAPHY  10/30/2007    EF 55-60%    Allergies  Allergen Reactions   Actonel [Risedronate Sodium] Other (See Comments)    Joint pain   Amoxicillin Diarrhea   Erythromycin Other (See Comments)  Gi upset   Lipitor [Atorvastatin Calcium]    Monistat [Miconazole]    Zetia [Ezetimibe]     Joint pain    Outpatient Encounter Medications as of 07/20/2020  Medication Sig   acetaminophen (TYLENOL) 500 MG tablet Take 500 mg by mouth 3 (three) times daily as needed.   BIOTIN PO Take 1,500 mg by mouth daily.   BUDESONIDE PO Take by mouth.   Cannabidiol POWD by Does not apply route. This is a liquid roll on   clotrimazole (GYNE-LOTRIMIN 3) 2 % vaginal cream Place 1 Applicatorful vaginally at bedtime.   COVID-19 mRNA vaccine, Moderna, 100 MCG/0.5ML injection Inject into the muscle.   cyanocobalamin (,VITAMIN B-12,) 1000 MCG/ML injection Inject 1 mL (1,000 mcg total) into the muscle every 30 (thirty) days.   estradiol (ESTRACE) 0.1 MG/GM vaginal cream Place  1 Applicatorful vaginally at bedtime.   famotidine (PEPCID) 40 MG tablet Take 40 mg by mouth daily.   flurazepam (DALMANE) 15 MG capsule Take 1 capsule (15 mg total) by mouth at bedtime as needed for sleep.   GLUCOSAMINE-CHONDROITIN PO Take 1 tablet by mouth daily.   GuaiFENesin (MUCINEX PO) Take 1 tablet by mouth every 12 (twelve) hours as needed (congestion).    hydrocortisone 2.5 % cream Apply topically 2 (two) times daily.   ipratropium (ATROVENT) 0.03 % nasal spray Place 2 sprays into both nostrils 2 (two) times daily as needed for rhinitis. As directed nasal spray   Melatonin 5 MG TABS Take 1 tablet by mouth at bedtime as needed (Takes an additional as needed when wakes up).    Multiple Vitamin (MULTIVITAMIN) capsule Take 1 capsule by mouth daily.   naproxen sodium (ALEVE) 220 MG tablet Take 220 mg by mouth as needed.   Niacinamide-Triamcinolone Acet 4-0.1 % CREA Apply topically at bedtime as needed.   ofloxacin (OCUFLOX) 0.3 % ophthalmic solution Place 1 drop into the left ear 4 (four) times daily.   Vitamin D, Ergocalciferol, (DRISDOL) 1.25 MG (50000 UNIT) CAPS capsule Take 1 capsule (50,000 Units total) by mouth every 7 (seven) days.   [DISCONTINUED] budesonide (ENTOCORT EC) 3 MG 24 hr capsule Take 3 capsules (9 mg total) by mouth daily as needed.   [DISCONTINUED] fluticasone (FLONASE) 50 MCG/ACT nasal spray Place 1 spray into both nostrils daily as needed for allergies or rhinitis.   denosumab (PROLIA) 60 MG/ML SOSY injection Inject 60 mg into the skin every 6 (six) months.   timolol (BETIMOL) 0.5 % ophthalmic solution Place 1 drop into both eyes daily.   [DISCONTINUED] carbamide peroxide (DEBROX) 6.5 % OTIC solution Place 5 drops into the left ear at bedtime. For one week   [DISCONTINUED] timolol (BETIMOL) 0.5 % ophthalmic solution Place 1 drop into both eyes daily.   No facility-administered encounter medications on file as of 07/20/2020.    Review of Systems:  Review of Systems   Constitutional: Negative.   HENT:  Positive for ear discharge and ear pain.   Respiratory: Negative.    Cardiovascular: Negative.   Gastrointestinal: Negative.   Genitourinary: Negative.   Musculoskeletal: Negative.   Skin: Negative.   Neurological: Negative.   Psychiatric/Behavioral: Negative.     Health Maintenance  Topic Date Due   TETANUS/TDAP  01/02/2020   INFLUENZA VACCINE  08/01/2020   MAMMOGRAM  09/20/2020   DEXA SCAN  09/20/2021   COVID-19 Vaccine  Completed   PNA vac Low Risk Adult  Completed   Zoster Vaccines- Shingrix  Completed   HPV VACCINES  Aged  Out    Physical Exam: Vitals:   07/20/20 1044  BP: 126/88  Pulse: 76  Temp: 97.7 F (36.5 C)  SpO2: 99%  Weight: 111 lb (50.3 kg)  Height: '5\' 2"'$  (1.575 m)   Body mass index is 20.3 kg/m. Physical Exam Constitutional: Oriented to person, place, and time. Well-developed and well-nourished.  HENT:  Head: Normocephalic.  Mouth/Throat: Oropharynx is clear and moist.  Ears have some wax with redness in her Left Ear TM normal Right Normal TM Eyes: Pupils are equal, round, and reactive to light.  Neck: Neck supple.  Cardiovascular: Normal rate and normal heart sounds.  No murmur heard. Pulmonary/Chest: Effort normal and breath sounds normal. No respiratory distress. No wheezes. She has no rales.  Abdominal: Soft. Bowel sounds are normal. No distension. There is no tenderness. There is no rebound.  Musculoskeletal: No edema.  Lymphadenopathy: none Neurological: Alert and oriented to person, place, and time.  Skin: Skin is warm and dry.  Psychiatric: Normal mood and affect. Behavior is normal. Thought content normal.    Labs reviewed: Basic Metabolic Panel: Recent Labs    03/03/20 0000  NA 132*  K 4.6  CL 99  CO2 22  BUN 21  CREATININE 0.9  CALCIUM 10.3   Liver Function Tests: No results for input(s): AST, ALT, ALKPHOS, BILITOT, PROT, ALBUMIN in the last 8760 hours. No results for input(s):  LIPASE, AMYLASE in the last 8760 hours. No results for input(s): AMMONIA in the last 8760 hours. CBC: No results for input(s): WBC, NEUTROABS, HGB, HCT, MCV, PLT in the last 8760 hours. Lipid Panel: No results for input(s): CHOL, HDL, LDLCALC, TRIG, CHOLHDL, LDLDIRECT in the last 8760 hours. Lab Results  Component Value Date   HGBA1C 5.4 09/23/2017    Procedures since last visit: No results found.  Assessment/Plan 1. Otitis of left ear Continue Dexamethasone drops for now If Symptoms continue send to ENT Again told patient not to use her fingers  2. Vitamin B 12 deficiency Now on Sublingual B12 Will check Levels again I flow go back on injections can reduce frequency to Every other Month  3. Benign essential hypertension On No Meds Follows with Cardiology 4. Polymyalgia rheumatica (HCC) Has been Off prednisone for many years  5. Primary hyperparathyroidism ([place]) Repeat Calcium levels  6 Osteoporosis She missed her Prolia  shot in March of this year as she thinks she doe snot need tot ake it Per reviewing her chart patient has been consistently on Prolia since 2017.  Though there is a note saying that she has been on it since 2014 not sure if that was consistent Her last DEXA scan showed decrease in BMD of right hip Everything else is stable. I discussed with the patient that she has to stay on Prolia unless she wants to start taking any substitute medication like biphosphonate's.  Which she is not interested.  Continue Prolia for now Will discuss  making  referral to Dr. Cruzita Lederer to discuss her different options.  7 Vaginal Atrophy and Stage III enterocele/posterior vaginal wall prolapse has  failed trial of pessary per GYN Estradiol Cream and Crisco per GYN   Labs/tests ordered:  CMP,B12 level next week Next appt:  07/20/2020

## 2020-07-28 ENCOUNTER — Encounter: Payer: Self-pay | Admitting: Internal Medicine

## 2020-07-28 DIAGNOSIS — E538 Deficiency of other specified B group vitamins: Secondary | ICD-10-CM | POA: Diagnosis not present

## 2020-07-28 DIAGNOSIS — E210 Primary hyperparathyroidism: Secondary | ICD-10-CM | POA: Diagnosis not present

## 2020-07-28 DIAGNOSIS — I10 Essential (primary) hypertension: Secondary | ICD-10-CM | POA: Diagnosis not present

## 2020-07-28 LAB — BASIC METABOLIC PANEL
BUN: 20 (ref 4–21)
CO2: 29 — AB (ref 13–22)
Chloride: 99 (ref 99–108)
Creatinine: 0.8 (ref 0.5–1.1)
Glucose: 98
Potassium: 4.8 (ref 3.4–5.3)
Sodium: 136 — AB (ref 137–147)

## 2020-07-28 LAB — HEPATIC FUNCTION PANEL
ALT: 12 (ref 7–35)
AST: 23 (ref 13–35)
Alkaline Phosphatase: 49 (ref 25–125)
Bilirubin, Total: 0.3

## 2020-07-28 LAB — VITAMIN B12: Vitamin B-12: 2000

## 2020-07-28 LAB — COMPREHENSIVE METABOLIC PANEL
Albumin: 4.5 (ref 3.5–5.0)
Calcium: 11.5 — AB (ref 8.7–10.7)
Globulin: 2.1

## 2020-07-28 LAB — CBC: RBC: 3.36 — AB (ref 3.87–5.11)

## 2020-07-28 LAB — CBC AND DIFFERENTIAL
HCT: 32 — AB (ref 36–46)
Hemoglobin: 10.8 — AB (ref 12.0–16.0)
Platelets: 202 (ref 150–399)
WBC: 5.1

## 2020-08-01 ENCOUNTER — Other Ambulatory Visit: Payer: Self-pay | Admitting: Internal Medicine

## 2020-08-01 DIAGNOSIS — H6692 Otitis media, unspecified, left ear: Secondary | ICD-10-CM

## 2020-08-01 DIAGNOSIS — E538 Deficiency of other specified B group vitamins: Secondary | ICD-10-CM

## 2020-08-01 MED ORDER — CYANOCOBALAMIN 1000 MCG/ML IJ SOLN
1000.0000 ug | INTRAMUSCULAR | 3 refills | Status: DC
  Filled 2020-10-27: qty 3, 90d supply, fill #0

## 2020-08-11 LAB — BASIC METABOLIC PANEL
BUN: 21 (ref 4–21)
CO2: 26 — AB (ref 13–22)
Chloride: 97 — AB (ref 99–108)
Creatinine: 0.9 (ref 0.5–1.1)
Glucose: 105
Potassium: 4.3 (ref 3.4–5.3)
Sodium: 134 — AB (ref 137–147)

## 2020-08-11 LAB — COMPREHENSIVE METABOLIC PANEL: Calcium: 11.4 — AB (ref 8.7–10.7)

## 2020-08-15 ENCOUNTER — Other Ambulatory Visit: Payer: Self-pay

## 2020-08-15 ENCOUNTER — Ambulatory Visit: Payer: PPO | Admitting: Adult Health

## 2020-08-15 ENCOUNTER — Encounter: Payer: Self-pay | Admitting: Adult Health

## 2020-08-15 VITALS — BP 144/90 | HR 72 | Temp 96.7°F | Ht 62.0 in | Wt 111.6 lb

## 2020-08-15 DIAGNOSIS — R2230 Localized swelling, mass and lump, unspecified upper limb: Secondary | ICD-10-CM | POA: Diagnosis not present

## 2020-08-15 DIAGNOSIS — E210 Primary hyperparathyroidism: Secondary | ICD-10-CM

## 2020-08-15 DIAGNOSIS — H66005 Acute suppurative otitis media without spontaneous rupture of ear drum, recurrent, left ear: Secondary | ICD-10-CM

## 2020-08-15 DIAGNOSIS — N952 Postmenopausal atrophic vaginitis: Secondary | ICD-10-CM

## 2020-08-15 NOTE — Patient Instructions (Addendum)
Stop all Vit D supplementation and stop any calcium supplements  Have your blood redrawn in 6 weeks  Obtain xray of the right hand to eval for cyst  Resume cipro and dexamethasone drops for left ear otitis   Cover your ear when in the shower.   Follow up with ENT

## 2020-08-15 NOTE — Progress Notes (Signed)
[facility]  POS:  clinic  Provider:  Cindi Carbon, [place] [phone]   Code Status: DNR Goals of Care:  Advanced Directives 08/15/2020  Does Patient Have a Medical Advance Directive? Yes  Type of Advance Directive Out of facility DNR (pink MOST or yellow form);Healthcare Power of Attorney  Does patient want to make changes to medical advance directive? No - Patient declined  Copy of [place] in Chart? Yes - validated most recent copy scanned in chart (See row information)  Pre-existing out of facility DNR order (yellow form or pink MOST form) Yellow form placed in chart (order not valid for inpatient use)     Chief Complaint  Patient presents with   Medical Management of Chronic Issues    Patient returns to the clinic due to a right hand nodule.    HPI: Patient is a 87 y.o. female seen today for an acute visit for a nodule on the right hand. She reports the area is slightly painful and has been present for several days. There is no known injury to the area and no numbness or tingling.  She also reports some chronic irritation to the vaginal area and burning. She is using estrogen cream with some benefit. She was seeing Dr. Rodman Key with [facility] but has trouble getting in to see them and wants to see someone else.   Ca 11.4 on 8/10 with hx of hyperparathyroidism. Currently on Vit D and Prolia  No symptoms  She has a hx of left ear otitis and has been taking cipro and dexamethasone in the past and continues with some drainage of the left ear but no pain or fever.      Past Medical History:  Diagnosis Date   Allergy    Arthritis    Back pain    Blepharitis    Cancer (HCC)    basal cell on face   Candidal esophagitis ([place]) 2010   Cataract    Chest pain, atypical    Collagenous colitis    Dr. Maurene Capes   Diverticulosis    Vira Agar' corneal dystrophy    GERD (gastroesophageal reflux disease)    Heart murmur     Hiatal hernia 2010   History of fibromyalgia    Hypercalcemia    Hyperlipidemia    Hypertension    IBS (irritable bowel syndrome)    Insomnia    Osteoporosis    Primary hyperparathyroidism ([place]) 03/11/2017   Rectocele    Scoliosis    Shingles 1975   at waistline, no chronic pain.     Spinal stenosis    Vaginal prolapse     Past Surgical History:  Procedure Laterality Date   ABDOMINAL HYSTERECTOMY     BREAST BIOPSY  1964   left benign   CARDIOVASCULAR STRESS TEST  12/03/2007   EF 72%   CATARACT EXTRACTION W/ INTRAOCULAR LENS  IMPLANT, BILATERAL     COLONOSCOPY  04/24/2006   diverticulosis   FLEXIBLE SIGMOIDOSCOPY  07/27/2010   diverticulosis, collagenous colitis   TONSILLECTOMY AND ADENOIDECTOMY     UPPER GASTROINTESTINAL ENDOSCOPY  01/29/2008   esophagitis, hiatal hernia, gastritis   US ECHOCARDIOGRAPHY  10/30/2007    EF 55-60%    Allergies  Allergen Reactions   Actonel [Risedronate Sodium] Other (See Comments)    Joint pain   Amoxicillin Diarrhea   Erythromycin Other (See Comments)    Gi upset   Lipitor [Atorvastatin Calcium]    Monistat [Miconazole]  Zetia [Ezetimibe]     Joint pain    Outpatient Encounter Medications as of 08/15/2020  Medication Sig   acetaminophen (TYLENOL) 500 MG tablet Take 500 mg by mouth 3 (three) times daily as needed.   BIOTIN PO Take 1,500 mg by mouth daily.   BUDESONIDE PO Take by mouth.   Cannabidiol POWD by Does not apply route. This is a liquid roll on   COVID-19 mRNA vaccine, Moderna, 100 MCG/0.5ML injection Inject into the muscle.   cyanocobalamin (,VITAMIN B-12,) 1000 MCG/ML injection Inject 1 mL (1,000 mcg total) into the muscle every 30 (thirty) days.   denosumab (PROLIA) 60 MG/ML SOSY injection Inject 60 mg into the skin every 6 (six) months.   dexamethasone (DECADRON) 0.1 % ophthalmic solution Place 2 drops into the left eye 2 (two) times daily as needed. Use these drops for you left Ear as needed   estradiol (ESTRACE)  0.1 MG/GM vaginal cream Place 1 Applicatorful vaginally at bedtime.   famotidine (PEPCID) 40 MG tablet TAKE ONE TABLET BY MOUTH TWO TIMES A DAY   flurazepam (DALMANE) 15 MG capsule Take 1 capsule (15 mg total) by mouth at bedtime as needed for sleep.   fluticasone (FLONASE) 50 MCG/ACT nasal spray Place 1 spray into both nostrils daily as needed for allergies or rhinitis.   GLUCOSAMINE-CHONDROITIN PO Take 1 tablet by mouth daily.   GuaiFENesin (MUCINEX PO) Take 1 tablet by mouth every 12 (twelve) hours as needed (congestion).    hydrocortisone 2.5 % cream Apply topically 2 (two) times daily.   ipratropium (ATROVENT) 0.03 % nasal spray Place 2 sprays into both nostrils 2 (two) times daily as needed for rhinitis. As directed nasal spray   Melatonin 5 MG TABS Take 1 tablet by mouth at bedtime as needed (Takes an additional as needed when wakes up).    Multiple Vitamin (MULTIVITAMIN) capsule Take 1 capsule by mouth daily.   naproxen sodium (ALEVE) 220 MG tablet Take 220 mg by mouth as needed.   timolol (BETIMOL) 0.5 % ophthalmic solution Place 1 drop into both eyes daily.   [DISCONTINUED] Vitamin D, Ergocalciferol, (DRISDOL) 1.25 MG (50000 UNIT) CAPS capsule Take 1 capsule (50,000 Units total) by mouth every 7 (seven) days.   ofloxacin (OCUFLOX) 0.3 % ophthalmic solution Place 1 drop into the left ear 4 (four) times daily. (Patient not taking: Reported on 08/15/2020)   [DISCONTINUED] clotrimazole (GYNE-LOTRIMIN 3) 2 % vaginal cream Place 1 Applicatorful vaginally at bedtime.   [DISCONTINUED] Niacinamide-Triamcinolone Acet 4-0.1 % CREA Apply topically at bedtime as needed.   No facility-administered encounter medications on file as of 08/15/2020.    Review of Systems:  Review of Systems  Constitutional:  Negative for activity change, appetite change, chills, diaphoresis, fatigue, fever and unexpected weight change.  HENT:  Positive for ear discharge. Negative for congestion, ear pain, facial swelling,  hearing loss, mouth sores, nosebleeds, postnasal drip, rhinorrhea, sinus pressure and sinus pain.   Respiratory:  Negative for cough, shortness of breath and wheezing.   Cardiovascular:  Negative for chest pain, palpitations and leg swelling.  Gastrointestinal:  Negative for abdominal distention, abdominal pain, constipation and diarrhea.  Genitourinary:  Negative for decreased urine volume, difficulty urinating, dysuria, frequency, menstrual problem, pelvic pain, vaginal bleeding, vaginal discharge and vaginal pain.       Vaginal burning and irritation  Musculoskeletal:  Positive for gait problem. Negative for arthralgias, back pain, joint swelling and myalgias.       Right hand nodule with mild pain  Neurological:  Negative for dizziness, tremors, seizures, syncope, facial asymmetry, speech difficulty, weakness, light-headedness, numbness and headaches.  Psychiatric/Behavioral:  Negative for agitation, behavioral problems and confusion.    Health Maintenance  Topic Date Due   TETANUS/TDAP  01/02/2020   INFLUENZA VACCINE  08/01/2020   MAMMOGRAM  09/20/2020   DEXA SCAN  09/20/2021   COVID-19 Vaccine  Completed   PNA vac Low Risk Adult  Completed   Zoster Vaccines- Shingrix  Completed   HPV VACCINES  Aged Out    Physical Exam: Vitals:   08/15/20 1508  BP: (!) 144/90  Pulse: 72  Temp: (!) 96.7 F (35.9 C)  SpO2: 96%  Weight: 111 lb 9.6 oz (50.6 kg)  Height: '5\' 2"'$  (1.575 m)   Body mass index is 20.41 kg/m. Physical Exam Vitals and nursing note reviewed.  Constitutional:      General: She is not in acute distress.    Appearance: She is not diaphoretic.  HENT:     Head: Normocephalic and atraumatic.     Ears:     Comments: Left ear with some purulent drainage and bleeding in the canal. TM is intact. NO tenderness or warmth or swelling is noted.  Neck:     Vascular: No JVD.  Cardiovascular:     Rate and Rhythm: Normal rate and regular rhythm.     Heart sounds: No murmur  heard. Pulmonary:     Effort: Pulmonary effort is normal. No respiratory distress.     Breath sounds: Normal breath sounds. No wheezing.  Musculoskeletal:     Comments: Right hand with firm nodule with mild erythema at the base of the middle finger on the palm side  Skin:    General: Skin is warm and dry.  Neurological:     Mental Status: She is alert and oriented to person, place, and time.    Labs reviewed: Basic Metabolic Panel: Recent Labs    03/03/20 0000 07/28/20 0000  NA 132* 136*  K 4.6 4.8  CL 99 99  CO2 22 29*  BUN 21 20  CREATININE 0.9 0.8  CALCIUM 10.3 11.5*   Liver Function Tests: Recent Labs    07/28/20 0000  AST 23  ALT 12  ALKPHOS 49  ALBUMIN 4.5   No results for input(s): LIPASE, AMYLASE in the last 8760 hours. No results for input(s): AMMONIA in the last 8760 hours. CBC: Recent Labs    07/28/20 0000  WBC 5.1  HGB 10.8*  HCT 32*  PLT 202   Lipid Panel: No results for input(s): CHOL, HDL, LDLCALC, TRIG, CHOLHDL, LDLDIRECT in the last 8760 hours. Lab Results  Component Value Date   HGBA1C 5.4 09/23/2017    Procedures since last visit: No results found.  Assessment/Plan 1. Nodule of skin of hand Possible ganglion cyst Check xray, if bothersome could refer to ortho  2. Postmenopause atrophic vaginitis Continue estrace - Ambulatory referral to Gynecology  3. Recurrent acute suppurative otitis media without spontaneous rupture of left tympanic membrane Resume Cipro gtts 4 x day for 1 week (has left over med) Continue dexamethasone as needed for itching - Ambulatory referral to ENT  4. Primary hyperparathyroidism ([place]) Stop any calcium supplements Stop Vit d Recheck Ca in 6 weeks   Total time 40 min:  time greater than 50% of total time spent doing pt counseling and coordination of care     Labs/tests ordered:  * No order type specified * Right hand xray BMP 6 weeks Next appt:  11/28/2020  

## 2020-08-16 DIAGNOSIS — M79641 Pain in right hand: Secondary | ICD-10-CM | POA: Diagnosis not present

## 2020-08-17 ENCOUNTER — Telehealth: Payer: Self-pay

## 2020-08-17 NOTE — Telephone Encounter (Signed)
Patient called and want to know if she could get a non opioid medication for her back pain. She reports reading an article from [facility] that Naloxone helps with back pain, but states that you don't have to prescribe that medication and can prescribe something else. She reports pain is unbearable at times and she have to lay down in bed. Please advise.

## 2020-08-18 ENCOUNTER — Ambulatory Visit (INDEPENDENT_AMBULATORY_CARE_PROVIDER_SITE_OTHER): Payer: PPO | Admitting: Otolaryngology

## 2020-08-18 ENCOUNTER — Other Ambulatory Visit: Payer: Self-pay

## 2020-08-18 DIAGNOSIS — B373 Candidiasis of vulva and vagina: Secondary | ICD-10-CM | POA: Diagnosis not present

## 2020-08-18 DIAGNOSIS — S00412A Abrasion of left ear, initial encounter: Secondary | ICD-10-CM | POA: Diagnosis not present

## 2020-08-18 DIAGNOSIS — H9222 Otorrhagia, left ear: Secondary | ICD-10-CM | POA: Diagnosis not present

## 2020-08-18 DIAGNOSIS — H6123 Impacted cerumen, bilateral: Secondary | ICD-10-CM

## 2020-08-18 DIAGNOSIS — N898 Other specified noninflammatory disorders of vagina: Secondary | ICD-10-CM | POA: Diagnosis not present

## 2020-08-18 DIAGNOSIS — H61893 Other specified disorders of external ear, bilateral: Secondary | ICD-10-CM

## 2020-08-18 NOTE — Progress Notes (Signed)
HPI: Beth Wise is a 87 y.o. female who presents is referred by her PCP for evaluation of ear canals.  Apparently she has some wax and bleeding in the left ear canal.  She does not describe any discomfort in the ear canals although she states they do itch and are dry..  Past Medical History:  Diagnosis Date   Allergy    Arthritis    Back pain    Blepharitis    Cancer (HCC)    basal cell on face   Candidal esophagitis ([place]) 2010   Cataract    Chest pain, atypical    Collagenous colitis    Dr. Maurene Capes   Diverticulosis    Vira Agar' corneal dystrophy    GERD (gastroesophageal reflux disease)    Heart murmur    Hiatal hernia 2010   History of fibromyalgia    Hypercalcemia    Hyperlipidemia    Hypertension    IBS (irritable bowel syndrome)    Insomnia    Osteoporosis    Primary hyperparathyroidism ([place]) 03/11/2017   Rectocele    Scoliosis    Shingles 1975   at waistline, no chronic pain.     Spinal stenosis    Vaginal prolapse    Past Surgical History:  Procedure Laterality Date   ABDOMINAL HYSTERECTOMY     BREAST BIOPSY  1964   left benign   CARDIOVASCULAR STRESS TEST  12/03/2007   EF 72%   CATARACT EXTRACTION W/ INTRAOCULAR LENS  IMPLANT, BILATERAL     COLONOSCOPY  04/24/2006   diverticulosis   FLEXIBLE SIGMOIDOSCOPY  07/27/2010   diverticulosis, collagenous colitis   TONSILLECTOMY AND ADENOIDECTOMY     UPPER GASTROINTESTINAL ENDOSCOPY  01/29/2008   esophagitis, hiatal hernia, gastritis   US ECHOCARDIOGRAPHY  10/30/2007    EF 55-60%   Social History   Socioeconomic History   Marital status: Widowed    Spouse name: Not on file   Number of children: 2   Years of education: Not on file   Highest education level: Not on file  Occupational History   Occupation: retired    Comment: owned Human resources officer  Tobacco Use   Smoking status: Never   Smokeless tobacco: Never  Vaping Use   Vaping Use: Never used  Substance and Sexual Activity   Alcohol use: Not Currently    Drug use: No   Sexual activity: Never  Other Topics Concern   Not on file  Social History Narrative   Social History      Diet? Regular (shouldn't have excessive amts of acid, hot &spicey foods)      Do you drink/eat things with caffeine? yes      Marital status?       widow                             What year were you married? [place] you live in a house, apartment, assisted living, condo, trailer, etc.? apartment      Is it one or more stories? 1      How many persons live in your home? 1 (self)      Do you have any pets in your home? (please list) no no !!      Highest level of education completed?      Current or past profession: hairdresser      Do you exercise?  little                                Type & how often? Leg lifts and plank, stretching every day.      Advanced Directives      Do you have a living will?  yes      Do you have a DNR form?         yes                         If not, do you want to discuss one? yes      Do you have signed POA/HPOA for forms? yes      Functional Status      Do you have difficulty bathing or dressing yourself?      Do you have difficulty preparing food or eating?       Do you have difficulty managing your medications?      Do you have difficulty managing your finances?      Do you have difficulty affording your medications?   Social Determinants of Health   Financial Resource Strain: Not on file  Food Insecurity: Not on file  Transportation Needs: Not on file  Physical Activity: Not on file  Stress: Not on file  Social Connections: Not on file   Family History  Problem Relation Age of Onset   Breast cancer Mother        died at 85   Stroke Mother    Heart disease Father        died at 34   Cirrhosis Sister        died at 56   Heart disease Brother        died at 43   Pneumonia Sister        died at 64 months   Heart disease Sister        died at 79   Cancer - Other Sister         stomach; died at 58   Stomach cancer Sister    Heart disease Brother        c-diff and sepsis; died at 57   Colon cancer Neg Hx    Diabetes Neg Hx    Rectal cancer Neg Hx    Esophageal cancer Neg Hx    Allergies  Allergen Reactions   Actonel [Risedronate Sodium] Other (See Comments)    Joint pain   Amoxicillin Diarrhea   Erythromycin Other (See Comments)    Gi upset   Lipitor [Atorvastatin Calcium]    Monistat [Miconazole]    Zetia [Ezetimibe]     Joint pain   Prior to Admission medications   Medication Sig Start Date End Date Taking? Authorizing Provider  acetaminophen (TYLENOL) 500 MG tablet Take 500 mg by mouth 3 (three) times daily as needed.    [provider]  BIOTIN PO Take 1,500 mg by mouth daily.    [provider]  BUDESONIDE PO Take by mouth.    [provider]  Cannabidiol POWD by Does not apply route. This is a liquid roll on    [provider]  COVID-19 mRNA vaccine, Moderna, 100 MCG/0.5ML injection Inject into the muscle. 05/13/20   Carlyle Basques, MD  cyanocobalamin (,VITAMIN B-12,) 1000 MCG/ML injection Inject 1 mL (1,000 mcg total) into the muscle every 30 (thirty) days. 08/01/20   Veleta Miners  L, MD  denosumab (PROLIA) 60 MG/ML SOSY injection Inject 60 mg into the skin every 6 (six) months. 03/25/19   Reed, Tiffany L, DO  dexamethasone (DECADRON) 0.1 % ophthalmic solution Place 2 drops into the left eye 2 (two) times daily as needed. Use these drops for you left Ear as needed 07/22/20   Virgie Dad, MD  estradiol (ESTRACE) 0.1 MG/GM vaginal cream Place 1 Applicatorful vaginally at bedtime.    [provider]  famotidine (PEPCID) 40 MG tablet TAKE ONE TABLET BY MOUTH TWO TIMES A DAY 08/01/20   Gatha Mayer, MD  flurazepam [facility]) 15 MG capsule Take 1 capsule (15 mg total) by mouth at bedtime as needed for sleep. 06/27/20   Virgie Dad, MD  fluticasone (FLONASE) 50 MCG/ACT nasal spray Place 1 spray into both  nostrils daily as needed for allergies or rhinitis. 07/20/20   Virgie Dad, MD  GLUCOSAMINE-CHONDROITIN PO Take 1 tablet by mouth daily.    [provider]  GuaiFENesin (MUCINEX PO) Take 1 tablet by mouth every 12 (twelve) hours as needed (congestion).     [provider]  hydrocortisone 2.5 % cream Apply topically 2 (two) times daily. 07/22/20   Virgie Dad, MD  ipratropium (ATROVENT) 0.03 % nasal spray Place 2 sprays into both nostrils 2 (two) times daily as needed for rhinitis. As directed nasal spray 07/08/19   Reed, Tiffany L, DO  Melatonin 5 MG TABS Take 1 tablet by mouth at bedtime as needed (Takes an additional as needed when wakes up).     [provider]  Multiple Vitamin (MULTIVITAMIN) capsule Take 1 capsule by mouth daily.    [provider]  naproxen sodium (ALEVE) 220 MG tablet Take 220 mg by mouth as needed.    [provider]  ofloxacin (OCUFLOX) 0.3 % ophthalmic solution Place 1 drop into the left ear 4 (four) times daily. Patient not taking: Reported on 08/15/2020 04/20/20   Virgie Dad, MD  timolol (BETIMOL) 0.5 % ophthalmic solution Place 1 drop into both eyes daily. 07/22/20   Virgie Dad, MD     Positive ROS: Otherwise negative  All other systems have been reviewed and were otherwise negative with the exception of those mentioned in the HPI and as above.  Physical Exam: Constitutional: Alert, well-appearing, no acute distress Ears: External ears without lesions or tenderness.  The right ear canal has some dried wax within the ear canal that was obstructing the ear canal.  This was removed with forceps.  She had some slight dry skin in 1 small sore anteriorly on the right side.  The TM was intact and clear otherwise.  The left ear also has some dried wax in and some dried blood within the ear canal that was removed with curette and forceps.  Of note she had a small amount of granulation tissue and a small sore within the  left ear canal.  After cleaning the left ear canal I applied gentian violet and CSF powder to the small sore and granulation tissue on the left side only.  The TM was otherwise clear Nasal: External nose without lesions.. Clear nasal passages Oral: Lips and gums without lesions. Tongue and palate mucosa without lesions. Posterior oropharynx clear. Neck: No palpable adenopathy or masses Respiratory: Breathing comfortably  Skin: No facial/neck lesions or rash noted.  Cerumen impaction removal  Date/Time: 08/18/2020 5:31 PM Performed by: Rozetta Nunnery, MD Authorized by: Rozetta Nunnery, MD  Consent:    Consent obtained:  Verbal   Consent given by:  Patient   Risks discussed:  Pain and bleeding Procedure details:    Location:  L ear and R ear   Procedure type: curette, suction and forceps   Post-procedure details:    Inspection:  TM intact and canal normal   Hearing quality:  Improved   Procedure completion:  Tolerated well, no immediate complications Comments:     Both ear canals were cleaned in the office today the right ear canal had a small sore in the ear canal or scab in the ear canal with dry skin but no granulation tissue.  Left ear canal likewise had a sore with some granulation tissue that apparently had bled.  I applied gentian violet and CSF powder to the left ear canal only.  Both TMs were clear bilaterally.  Assessment: Patient with dry ear canals bilaterally with wax buildup that was cleaned in the office.  In addition she had a small sore in the left ear canal with some granulation tissue.  Plan: After cleaning the ear canals I applied gentian violet and CSF powder to the left ear canal where she has some granulation tissue and a small sore which appears to be traumatic.   Radene Journey, MD   CC:

## 2020-08-22 ENCOUNTER — Encounter: Payer: PPO | Admitting: Adult Health

## 2020-08-25 ENCOUNTER — Telehealth: Payer: Self-pay | Admitting: Internal Medicine

## 2020-08-25 DIAGNOSIS — Z85828 Personal history of other malignant neoplasm of skin: Secondary | ICD-10-CM | POA: Diagnosis not present

## 2020-08-25 DIAGNOSIS — D225 Melanocytic nevi of trunk: Secondary | ICD-10-CM | POA: Diagnosis not present

## 2020-08-25 DIAGNOSIS — L661 Lichen planopilaris: Secondary | ICD-10-CM | POA: Diagnosis not present

## 2020-08-25 DIAGNOSIS — L820 Inflamed seborrheic keratosis: Secondary | ICD-10-CM | POA: Diagnosis not present

## 2020-08-25 DIAGNOSIS — L578 Other skin changes due to chronic exposure to nonionizing radiation: Secondary | ICD-10-CM | POA: Diagnosis not present

## 2020-08-25 DIAGNOSIS — L817 Pigmented purpuric dermatosis: Secondary | ICD-10-CM | POA: Diagnosis not present

## 2020-08-25 NOTE — Telephone Encounter (Signed)
Pt has been having issues with bms. She would like some advise. Pls call her.

## 2020-08-26 NOTE — Telephone Encounter (Signed)
Pt stated that she is feeling a lot better. Pt stated that she had a BM today and it came easy.

## 2020-08-31 ENCOUNTER — Encounter: Payer: PPO | Admitting: Internal Medicine

## 2020-09-06 DIAGNOSIS — L738 Other specified follicular disorders: Secondary | ICD-10-CM | POA: Diagnosis not present

## 2020-09-06 DIAGNOSIS — L821 Other seborrheic keratosis: Secondary | ICD-10-CM | POA: Diagnosis not present

## 2020-09-06 DIAGNOSIS — L570 Actinic keratosis: Secondary | ICD-10-CM | POA: Diagnosis not present

## 2020-09-06 DIAGNOSIS — L817 Pigmented purpuric dermatosis: Secondary | ICD-10-CM | POA: Diagnosis not present

## 2020-09-06 DIAGNOSIS — Z85828 Personal history of other malignant neoplasm of skin: Secondary | ICD-10-CM | POA: Diagnosis not present

## 2020-09-08 ENCOUNTER — Ambulatory Visit (INDEPENDENT_AMBULATORY_CARE_PROVIDER_SITE_OTHER): Payer: PPO | Admitting: Otolaryngology

## 2020-09-14 ENCOUNTER — Encounter: Payer: PPO | Admitting: Internal Medicine

## 2020-09-16 ENCOUNTER — Telehealth: Payer: Self-pay

## 2020-09-16 ENCOUNTER — Other Ambulatory Visit (HOSPITAL_BASED_OUTPATIENT_CLINIC_OR_DEPARTMENT_OTHER): Payer: Self-pay

## 2020-09-16 NOTE — Telephone Encounter (Signed)
Patient just wants to make sure she can proceed with her next covid booster.   To Dr. Lyndel Safe

## 2020-09-19 ENCOUNTER — Other Ambulatory Visit: Payer: Self-pay

## 2020-09-19 ENCOUNTER — Encounter: Payer: Self-pay | Admitting: Adult Health

## 2020-09-19 ENCOUNTER — Other Ambulatory Visit (HOSPITAL_BASED_OUTPATIENT_CLINIC_OR_DEPARTMENT_OTHER): Payer: Self-pay

## 2020-09-19 ENCOUNTER — Non-Acute Institutional Stay: Payer: PPO | Admitting: Adult Health

## 2020-09-19 VITALS — BP 128/74 | HR 74 | Temp 97.2°F | Ht 62.0 in | Wt 110.0 lb

## 2020-09-19 DIAGNOSIS — J029 Acute pharyngitis, unspecified: Secondary | ICD-10-CM

## 2020-09-19 MED ORDER — LIDOCAINE VISCOUS HCL 2 % MT SOLN
5.0000 mL | Freq: Three times a day (TID) | OROMUCOSAL | 0 refills | Status: AC
  Filled 2020-09-19: qty 105, 7d supply, fill #0

## 2020-09-19 NOTE — Progress Notes (Signed)
[facility]  POS:  clinic  Provider:  Cindi Carbon, [place] [phone]   Code Status: DNR Goals of Care:  Advanced Directives 09/19/2020  Does Patient Have a Medical Advance Directive? Yes  Type of Advance Directive Out of facility DNR (pink MOST or yellow form);Healthcare Power of Attorney  Does patient want to make changes to medical advance directive? No - Patient declined  Copy of [place] in Chart? Yes - validated most recent copy scanned in chart (See row information)  Pre-existing out of facility DNR order (yellow form or pink MOST form) Yellow form placed in chart (order not valid for inpatient use)     Chief Complaint  Patient presents with   Acute Visit    Patient returns to the clinic with a sore throat after being exposed to covid.    HPI: Patient is a 87 y.o. female seen today for an acute visit for sore throat after covid exposure. She has had a sore throat for 4 days. She has taken two at home tests which were negative for covid. She does not have a cough, body aches, sob, nausea, vomiting, etc. She feels her tongue is sore and that her throat burns when eating and drinking.  Pt has received 4 vaccinations for covid, last one 05/13/20  Past Medical History:  Diagnosis Date   Allergy    Arthritis    Back pain    Blepharitis    Cancer ([place])    basal cell on face   Candidal esophagitis ([place]) 2010   Cataract    Chest pain, atypical    Collagenous colitis    Dr. Maurene Capes   Diverticulosis    Vira Agar' corneal dystrophy    GERD (gastroesophageal reflux disease)    Heart murmur    Hiatal hernia 2010   History of fibromyalgia    Hypercalcemia    Hyperlipidemia    Hypertension    IBS (irritable bowel syndrome)    Insomnia    Osteoporosis    Primary hyperparathyroidism ([place]) 03/11/2017   Rectocele    Scoliosis    Shingles 1975   at waistline, no chronic pain.     Spinal stenosis    Vaginal  prolapse     Past Surgical History:  Procedure Laterality Date   ABDOMINAL HYSTERECTOMY     BREAST BIOPSY  1964   left benign   CARDIOVASCULAR STRESS TEST  12/03/2007   EF 72%   CATARACT EXTRACTION W/ INTRAOCULAR LENS  IMPLANT, BILATERAL     COLONOSCOPY  04/24/2006   diverticulosis   FLEXIBLE SIGMOIDOSCOPY  07/27/2010   diverticulosis, collagenous colitis   TONSILLECTOMY AND ADENOIDECTOMY     UPPER GASTROINTESTINAL ENDOSCOPY  01/29/2008   esophagitis, hiatal hernia, gastritis   US ECHOCARDIOGRAPHY  10/30/2007    EF 55-60%    Allergies  Allergen Reactions   Actonel [Risedronate Sodium] Other (See Comments)    Joint pain   Amoxicillin Diarrhea   Erythromycin Other (See Comments)    Gi upset   Lipitor [Atorvastatin Calcium]    Monistat [Miconazole]    Zetia [Ezetimibe]     Joint pain    Outpatient Encounter Medications as of 09/19/2020  Medication Sig   acetaminophen (TYLENOL) 500 MG tablet Take 500 mg by mouth 3 (three) times daily as needed.   BIOTIN PO Take 1,500 mg by mouth daily.   BUDESONIDE PO Take by mouth.   Cannabidiol POWD by Does not apply route. This is  a liquid roll on   COVID-19 mRNA vaccine, Moderna, 100 MCG/0.5ML injection Inject into the muscle.   cyanocobalamin (,VITAMIN B-12,) 1000 MCG/ML injection Inject 1 mL (1,000 mcg total) into the muscle every 30 (thirty) days.   dexamethasone (DECADRON) 0.1 % ophthalmic solution Place 2 drops into the left eye 2 (two) times daily as needed. Use these drops for you left Ear as needed   estradiol (ESTRACE) 0.1 MG/GM vaginal cream Place 1 Applicatorful vaginally at bedtime.   famotidine (PEPCID) 40 MG tablet TAKE ONE TABLET BY MOUTH TWO TIMES A DAY   flurazepam (DALMANE) 15 MG capsule Take 1 capsule (15 mg total) by mouth at bedtime as needed for sleep.   GLUCOSAMINE-CHONDROITIN PO Take 2 tablets by mouth daily.   GuaiFENesin (MUCINEX PO) Take 1 tablet by mouth every 12 (twelve) hours as needed (congestion).     hydrocortisone 2.5 % cream Apply topically 2 (two) times daily.   ipratropium (ATROVENT) 0.03 % nasal spray Place 2 sprays into both nostrils 2 (two) times daily as needed for rhinitis. As directed nasal spray   Melatonin 5 MG TABS Take 1 tablet by mouth at bedtime as needed (Takes an additional as needed when wakes up).    naproxen sodium (ALEVE) 220 MG tablet Take 220 mg by mouth as needed.   nystatin-triamcinolone (MYCOLOG II) cream Apply 1 application topically 2 (two) times daily.   ofloxacin (OCUFLOX) 0.3 % ophthalmic solution Place 1 drop into the left ear 4 (four) times daily.   timolol (BETIMOL) 0.5 % ophthalmic solution Place 1 drop into both eyes daily.   [DISCONTINUED] fluticasone (FLONASE) 50 MCG/ACT nasal spray Place 1 spray into both nostrils daily as needed for allergies or rhinitis.   Multiple Vitamin (MULTIVITAMIN) capsule Take 1 capsule by mouth daily. (Patient not taking: Reported on 09/19/2020)   [DISCONTINUED] denosumab (PROLIA) 60 MG/ML SOSY injection Inject 60 mg into the skin every 6 (six) months.   No facility-administered encounter medications on file as of 09/19/2020.    Review of Systems:  Review of Systems  Constitutional:  Negative for activity change, appetite change, chills, diaphoresis, fatigue, fever and unexpected weight change.  HENT:  Positive for sore throat. Negative for congestion, ear discharge, ear pain, facial swelling, rhinorrhea, sinus pain, tinnitus and trouble swallowing.   Respiratory:  Negative for cough, shortness of breath and wheezing.   Cardiovascular:  Negative for chest pain, palpitations and leg swelling.  Gastrointestinal:  Negative for abdominal distention, abdominal pain, constipation and diarrhea.  Genitourinary:  Negative for difficulty urinating and dysuria.  Musculoskeletal:  Positive for gait problem. Negative for arthralgias, back pain, joint swelling and myalgias.  Neurological:  Negative for dizziness, tremors, seizures,  syncope, facial asymmetry, speech difficulty, weakness, light-headedness, numbness and headaches.  Psychiatric/Behavioral:  Negative for agitation, behavioral problems and confusion.    Health Maintenance  Topic Date Due   TETANUS/TDAP  01/02/2020   INFLUENZA VACCINE  08/01/2020   MAMMOGRAM  09/20/2020   DEXA SCAN  09/20/2021   COVID-19 Vaccine  Completed   Zoster Vaccines- Shingrix  Completed   HPV VACCINES  Aged Out    Physical Exam: Vitals:   09/19/20 1543  Weight: 110 lb (49.9 kg)  Height: 5\' 2"  (1.575 m)   Body mass index is 20.12 kg/m. Physical Exam Vitals and nursing note reviewed.  Constitutional:      General: She is not in acute distress.    Appearance: She is not diaphoretic.  HENT:     Head:  Normocephalic and atraumatic.     Nose: Nose normal. No congestion.     Mouth/Throat:     Mouth: Mucous membranes are moist.     Pharynx: Posterior oropharyngeal erythema present. No oropharyngeal exudate.     Comments: Mild erythema and small amt of white drainage.  Neck:     Vascular: No JVD.  Cardiovascular:     Rate and Rhythm: Normal rate and regular rhythm.     Heart sounds: No murmur heard. Pulmonary:     Effort: Pulmonary effort is normal. No respiratory distress.     Breath sounds: Normal breath sounds. No wheezing.  Skin:    General: Skin is warm and dry.  Neurological:     Mental Status: She is alert and oriented to person, place, and time.    Labs reviewed: Basic Metabolic Panel: Recent Labs    03/03/20 0000 07/28/20 0000 08/11/20 0000  NA 132* 136* 134*  K 4.6 4.8 4.3  CL 99 99 97*  CO2 22 29* 26*  BUN 21 20 21   CREATININE 0.9 0.8 0.9  CALCIUM 10.3 11.5* 11.4*   Liver Function Tests: Recent Labs    07/28/20 0000  AST 23  ALT 12  ALKPHOS 49  ALBUMIN 4.5   No results for input(s): LIPASE, AMYLASE in the last 8760 hours. No results for input(s): AMMONIA in the last 8760 hours. CBC: Recent Labs    07/28/20 0000  WBC 5.1  HGB 10.8*   HCT 32*  PLT 202   Lipid Panel: No results for input(s): CHOL, HDL, LDLCALC, TRIG, CHOLHDL, LDLDIRECT in the last 8760 hours. Lab Results  Component Value Date   HGBA1C 5.4 09/23/2017    Procedures since last visit: No results found.  Assessment/Plan  1. Sore throat Rapid covid test negative.  No purulent exudate. Small amt of white drainage and erythema present. Possible mild thrush.  - magic mouthwash (lidocaine, diphenhydrAMINE, alum & mag hydroxide) suspension; Swish and spit 5 mLs 3 (three) times daily for 7 days.  Dispense: 105 mL; Refill: 0  Labs/tests ordered:  * No order type specified * Next appt:  10/05/2020

## 2020-09-19 NOTE — Telephone Encounter (Signed)
Yes she can.

## 2020-09-20 ENCOUNTER — Ambulatory Visit: Payer: PPO

## 2020-09-20 ENCOUNTER — Other Ambulatory Visit (HOSPITAL_BASED_OUTPATIENT_CLINIC_OR_DEPARTMENT_OTHER): Payer: Self-pay

## 2020-09-20 NOTE — Telephone Encounter (Signed)
DWP.  °

## 2020-09-22 DIAGNOSIS — Z1231 Encounter for screening mammogram for malignant neoplasm of breast: Secondary | ICD-10-CM | POA: Diagnosis not present

## 2020-09-22 LAB — HM MAMMOGRAPHY

## 2020-09-23 ENCOUNTER — Other Ambulatory Visit (HOSPITAL_BASED_OUTPATIENT_CLINIC_OR_DEPARTMENT_OTHER): Payer: Self-pay

## 2020-09-23 ENCOUNTER — Telehealth: Payer: Self-pay

## 2020-09-23 ENCOUNTER — Encounter: Payer: Self-pay | Admitting: *Deleted

## 2020-09-23 NOTE — Telephone Encounter (Signed)
Discussed with the patient.

## 2020-09-23 NOTE — Telephone Encounter (Signed)
Recommend senokot stool softener( 2 tablets daily) and miralax every other day. These can be purchased at local drug store. Senokot will help prevent straining and miralax is a gentle laxative. It is most important to stay hydrated with water daily. If you become dehydrated, constipation can occur more often. Avoid drinks with caffeine, they increase dehydration.

## 2020-09-23 NOTE — Telephone Encounter (Signed)
Patient has been having some constipation for several weeks, every now and then. She isn't going daily, maybe every 3 days. She doesn't feel her muscles are strong enough to push. She states her lower leg hurts when straining, although she tries her best not to.

## 2020-09-28 DIAGNOSIS — N816 Rectocele: Secondary | ICD-10-CM | POA: Diagnosis not present

## 2020-09-28 DIAGNOSIS — R102 Pelvic and perineal pain: Secondary | ICD-10-CM | POA: Diagnosis not present

## 2020-10-03 ENCOUNTER — Other Ambulatory Visit (HOSPITAL_COMMUNITY): Payer: Self-pay

## 2020-10-03 ENCOUNTER — Other Ambulatory Visit (HOSPITAL_BASED_OUTPATIENT_CLINIC_OR_DEPARTMENT_OTHER): Payer: Self-pay

## 2020-10-04 DIAGNOSIS — E210 Primary hyperparathyroidism: Secondary | ICD-10-CM | POA: Diagnosis not present

## 2020-10-04 LAB — BASIC METABOLIC PANEL
BUN: 25 — AB (ref 4–21)
CO2: 24 — AB (ref 13–22)
Chloride: 99 (ref 99–108)
Creatinine: 0.8 (ref 0.5–1.1)
Glucose: 98
Potassium: 4.6 (ref 3.4–5.3)
Sodium: 136 — AB (ref 137–147)

## 2020-10-04 LAB — COMPREHENSIVE METABOLIC PANEL: Calcium: 12 — AB (ref 8.7–10.7)

## 2020-10-05 ENCOUNTER — Encounter: Payer: PPO | Admitting: Internal Medicine

## 2020-10-06 ENCOUNTER — Telehealth: Payer: Self-pay | Admitting: Adult Health

## 2020-10-06 DIAGNOSIS — E210 Primary hyperparathyroidism: Secondary | ICD-10-CM

## 2020-10-06 NOTE — Telephone Encounter (Signed)
Called Beth Wise and went over her labs which showed her Ca had continue to increase and is now 31. She stopped Ca supplements. She has been drinking water. Will refer to endocrinology.

## 2020-10-17 ENCOUNTER — Encounter: Payer: Self-pay | Admitting: Adult Health

## 2020-10-17 ENCOUNTER — Non-Acute Institutional Stay: Payer: PPO | Admitting: Adult Health

## 2020-10-17 ENCOUNTER — Other Ambulatory Visit: Payer: Self-pay

## 2020-10-17 VITALS — BP 154/78 | HR 69 | Temp 96.3°F | Ht 62.0 in | Wt 112.0 lb

## 2020-10-17 DIAGNOSIS — E210 Primary hyperparathyroidism: Secondary | ICD-10-CM | POA: Diagnosis not present

## 2020-10-17 DIAGNOSIS — M542 Cervicalgia: Secondary | ICD-10-CM | POA: Diagnosis not present

## 2020-10-17 NOTE — Progress Notes (Signed)
[facility]  Provider: Cindi Carbon, [place] [phone]    Code Status: DNR Goals of Care:  Advanced Directives 10/17/2020  Does Patient Have a Medical Advance Directive? Yes  Type of Advance Directive Out of facility DNR (pink MOST or yellow form);Healthcare Power of Attorney  Does patient want to make changes to medical advance directive? No - Patient declined  Copy of [place] in Chart? Yes - validated most recent copy scanned in chart (See row information)  Pre-existing out of facility DNR order (yellow form or pink MOST form) Yellow form placed in chart (order not valid for inpatient use)     Chief Complaint  Patient presents with   Medical Management of Chronic Issues    Patient returns to the clinic for follow up.     HPI: Patient is a 87 y.o. female seen today for an acute visit for head and neck pain and questions about her calcium. Also wants me to look in her ears.  She reports some headache and  neck pain present when she wakes up in the morning for several days. The pain does not radiate to the shoulders or arms. No numbness or tingling. She bought a new pillow and it is better.  Also has hx of recurrent otitis. Treated previously with antibiotics and reports no fever, drainage, or pain  Additional she has hypercalcemia, last check 12. She stopped all ca supplements but it did not help. She has an apt with endocrinology but she has not gone yet.   Past Medical History:  Diagnosis Date   Allergy    Arthritis    Back pain    Blepharitis    Cancer (HCC)    basal cell on face   Candidal esophagitis ([place]) 2010   Cataract    Chest pain, atypical    Collagenous colitis    Dr. Maurene Capes   Diverticulosis    Vira Agar' corneal dystrophy    GERD (gastroesophageal reflux disease)    Heart murmur    Hiatal hernia 2010   History of fibromyalgia    Hypercalcemia    Hyperlipidemia    Hypertension    IBS (irritable bowel  syndrome)    Insomnia    Osteoporosis    Primary hyperparathyroidism ([place]) 03/11/2017   Rectocele    Scoliosis    Shingles 1975   at waistline, no chronic pain.     Spinal stenosis    Vaginal prolapse     Past Surgical History:  Procedure Laterality Date   ABDOMINAL HYSTERECTOMY     BREAST BIOPSY  1964   left benign   CARDIOVASCULAR STRESS TEST  12/03/2007   EF 72%   CATARACT EXTRACTION W/ INTRAOCULAR LENS  IMPLANT, BILATERAL     COLONOSCOPY  04/24/2006   diverticulosis   FLEXIBLE SIGMOIDOSCOPY  07/27/2010   diverticulosis, collagenous colitis   TONSILLECTOMY AND ADENOIDECTOMY     UPPER GASTROINTESTINAL ENDOSCOPY  01/29/2008   esophagitis, hiatal hernia, gastritis   US ECHOCARDIOGRAPHY  10/30/2007    EF 55-60%    Allergies  Allergen Reactions   Actonel [Risedronate Sodium] Other (See Comments)    Joint pain   Amoxicillin Diarrhea   Erythromycin Other (See Comments)    Gi upset   Lipitor [Atorvastatin Calcium]    Monistat [Miconazole]    Zetia [Ezetimibe]     Joint pain    Outpatient Encounter Medications as of 10/17/2020  Medication Sig   acetaminophen (TYLENOL) 500 MG tablet  Take 500 mg by mouth 4 (four) times daily as needed.   BIOTIN PO Take 1,500 mg by mouth daily.   BUDESONIDE PO Take by mouth.   Cannabidiol POWD by Does not apply route. This is a liquid roll on   COVID-19 mRNA vaccine, Moderna, 100 MCG/0.5ML injection Inject into the muscle.   cyanocobalamin (,VITAMIN B-12,) 1000 MCG/ML injection Inject 1 mL (1,000 mcg total) into the muscle every 30 (thirty) days.   dexamethasone (DECADRON) 0.1 % ophthalmic solution Place 2 drops into the left eye 2 (two) times daily as needed. Use these drops for you left Ear as needed   estradiol (ESTRACE) 0.1 MG/GM vaginal cream Place 1 Applicatorful vaginally at bedtime.   famotidine (PEPCID) 40 MG tablet TAKE ONE TABLET BY MOUTH TWO TIMES A DAY   flurazepam (DALMANE) 15 MG capsule Take 1 capsule (15 mg total) by mouth at  bedtime as needed for sleep.   GLUCOSAMINE-CHONDROITIN PO Take 2 tablets by mouth daily.   GuaiFENesin (MUCINEX PO) Take 1 tablet by mouth every 12 (twelve) hours as needed (congestion).    hydrocortisone 2.5 % cream Apply topically 2 (two) times daily.   ipratropium (ATROVENT) 0.03 % nasal spray Place 2 sprays into both nostrils 2 (two) times daily as needed for rhinitis. As directed nasal spray   Melatonin 5 MG TABS Take 1 tablet by mouth at bedtime as needed (Takes an additional as needed when wakes up).    Multiple Vitamin (MULTIVITAMIN) capsule Take 1 capsule by mouth daily. (Patient not taking: Reported on 09/19/2020)   naproxen sodium (ALEVE) 220 MG tablet Take 220 mg by mouth as needed.   nystatin-triamcinolone (MYCOLOG II) cream Apply 1 application topically 2 (two) times daily.   ofloxacin (OCUFLOX) 0.3 % ophthalmic solution Place 1 drop into the left ear 4 (four) times daily.   polyethylene glycol (MIRALAX / GLYCOLAX) 17 g packet Take 17 g by mouth every other day.   senna (SENOKOT) 8.6 MG TABS tablet Take 2 tablets by mouth daily.   timolol (BETIMOL) 0.5 % ophthalmic solution Place 1 drop into both eyes daily.   No facility-administered encounter medications on file as of 10/17/2020.    Review of Systems:  Review of Systems  Constitutional:  Negative for activity change, appetite change, chills, diaphoresis, fatigue, fever and unexpected weight change.  HENT:  Negative for ear discharge, ear pain, postnasal drip, rhinorrhea, sore throat and trouble swallowing.   Respiratory:  Negative for cough, shortness of breath and wheezing.   Cardiovascular:  Negative for chest pain, palpitations and leg swelling.  Gastrointestinal:  Negative for abdominal distention, abdominal pain, constipation and diarrhea.  Genitourinary:  Positive for vaginal pain (sees GYN). Negative for difficulty urinating and dysuria.  Musculoskeletal:  Positive for neck pain (head ache, neck pain improved). Negative  for arthralgias, back pain, gait problem (uses walker), joint swelling and myalgias.  Neurological:  Negative for dizziness, tremors, seizures, syncope, facial asymmetry, speech difficulty, weakness, light-headedness, numbness and headaches.  Psychiatric/Behavioral:  Negative for agitation, behavioral problems and confusion.    Health Maintenance  Topic Date Due   TETANUS/TDAP  01/02/2020   INFLUENZA VACCINE  08/01/2020   DEXA SCAN  09/20/2021   MAMMOGRAM  09/22/2021   COVID-19 Vaccine  Completed   Zoster Vaccines- Shingrix  Completed   HPV VACCINES  Aged Out    Physical Exam: Vitals:   10/17/20 1428  BP: (!) 154/78  Pulse: 69  Temp: (!) 96.3 F (35.7 C)  SpO2: 96%  Weight: 112 lb (50.8 kg)  Height: 5\' 2"  (1.575 m)   Body mass index is 20.49 kg/m. Physical Exam Vitals and nursing note reviewed.  Constitutional:      General: She is not in acute distress.    Appearance: She is not diaphoretic.  HENT:     Head: Normocephalic and atraumatic.     Right Ear: Ear canal and external ear normal.     Left Ear: Ear canal and external ear normal.     Ears:     Comments: TM obscured by scar tissue on the left and wax on the right. No purulent matter, swelling or redness.  Neck:     Vascular: No JVD.  Cardiovascular:     Rate and Rhythm: Normal rate and regular rhythm.     Heart sounds: No murmur heard. Pulmonary:     Effort: Pulmonary effort is normal. No respiratory distress.     Breath sounds: Normal breath sounds. No wheezing.  Musculoskeletal:        General: No swelling, tenderness, deformity or signs of injury.  Skin:    General: Skin is warm and dry.  Neurological:     Mental Status: She is alert and oriented to person, place, and time.    Labs reviewed: Basic Metabolic Panel: Recent Labs    07/28/20 0000 08/11/20 0000 10/04/20 0000  NA 136* 134* 136*  K 4.8 4.3 4.6  CL 99 97* 99  CO2 29* 26* 24*  BUN 20 21 25*  CREATININE 0.8 0.9 0.8  CALCIUM 11.5*  11.4* 12.0*   Liver Function Tests: Recent Labs    07/28/20 0000  AST 23  ALT 12  ALKPHOS 49  ALBUMIN 4.5   No results for input(s): LIPASE, AMYLASE in the last 8760 hours. No results for input(s): AMMONIA in the last 8760 hours. CBC: Recent Labs    07/28/20 0000  WBC 5.1  HGB 10.8*  HCT 32*  PLT 202   Lipid Panel: No results for input(s): CHOL, HDL, LDLCALC, TRIG, CHOLHDL, LDLDIRECT in the last 8760 hours. Lab Results  Component Value Date   HGBA1C 5.4 09/23/2017    Procedures since last visit: No results found.  Assessment/Plan  1. Cervicalgia Improved with supportive neck pillow Recommend tylenol, massage, stretching  2. Primary hyperparathyroidism ([place]) No improvement with stopping supplements May need calcitriol Await endocrinology input  Total time 21min:  time greater than 50% of total time spent doing pt counseling and coordination of care     Labs/tests ordered:  * No order type specified * Next appt:  11/09/2020

## 2020-10-27 ENCOUNTER — Other Ambulatory Visit (HOSPITAL_BASED_OUTPATIENT_CLINIC_OR_DEPARTMENT_OTHER): Payer: Self-pay

## 2020-11-09 ENCOUNTER — Non-Acute Institutional Stay: Payer: PPO | Admitting: Internal Medicine

## 2020-11-09 ENCOUNTER — Encounter: Payer: Self-pay | Admitting: Internal Medicine

## 2020-11-09 ENCOUNTER — Other Ambulatory Visit: Payer: Self-pay

## 2020-11-09 VITALS — BP 144/92 | HR 63 | Temp 97.2°F | Ht 62.0 in | Wt 108.2 lb

## 2020-11-09 DIAGNOSIS — H6063 Unspecified chronic otitis externa, bilateral: Secondary | ICD-10-CM

## 2020-11-09 DIAGNOSIS — E210 Primary hyperparathyroidism: Secondary | ICD-10-CM | POA: Diagnosis not present

## 2020-11-09 DIAGNOSIS — I10 Essential (primary) hypertension: Secondary | ICD-10-CM | POA: Diagnosis not present

## 2020-11-09 DIAGNOSIS — N952 Postmenopausal atrophic vaginitis: Secondary | ICD-10-CM | POA: Diagnosis not present

## 2020-11-09 DIAGNOSIS — E538 Deficiency of other specified B group vitamins: Secondary | ICD-10-CM

## 2020-11-09 DIAGNOSIS — M810 Age-related osteoporosis without current pathological fracture: Secondary | ICD-10-CM | POA: Diagnosis not present

## 2020-11-09 DIAGNOSIS — M542 Cervicalgia: Secondary | ICD-10-CM

## 2020-11-16 ENCOUNTER — Other Ambulatory Visit (HOSPITAL_BASED_OUTPATIENT_CLINIC_OR_DEPARTMENT_OTHER): Payer: Self-pay

## 2020-11-16 DIAGNOSIS — M810 Age-related osteoporosis without current pathological fracture: Secondary | ICD-10-CM | POA: Diagnosis not present

## 2020-11-17 ENCOUNTER — Other Ambulatory Visit (HOSPITAL_BASED_OUTPATIENT_CLINIC_OR_DEPARTMENT_OTHER): Payer: Self-pay

## 2020-11-21 ENCOUNTER — Telehealth: Payer: Self-pay | Admitting: Internal Medicine

## 2020-11-21 DIAGNOSIS — E210 Primary hyperparathyroidism: Secondary | ICD-10-CM

## 2020-11-21 NOTE — Telephone Encounter (Signed)
Ms Beth Wise saw Dr Beth Wise, endocrinologist with [facility] on wed 11/16, she was promised a phone call the very next day to go over lab results & also to make a 3 mth follow up appt.  As on today(mon 11/21), Ms Beth Wise has not rcvd a call about her lab results, but has viewed them on my chart. She also was given an appt for April 2023, bc Dr Beth Wise didn't have any availability within 3 mths.  Ms Beth Wise would like a 2nd opinion with another endocrinologist. She has an appt to see Beth Wise at [facility] on mon 11/28.  Thanks, Beth Wise

## 2020-11-21 NOTE — Telephone Encounter (Signed)
Ok she should try Renato Shin MD for another opinion. Thanks

## 2020-11-22 NOTE — Telephone Encounter (Signed)
Referral is pending for provider to review, associate diagnosis, and sign.

## 2020-11-27 NOTE — Progress Notes (Signed)
Location:  [place] of Service:  Clinic (12)  Provider:   Code Status:  Goals of Care:  Advanced Directives 11/09/2020  Does Patient Have a Medical Advance Directive? Yes  Type of Advance Directive Out of facility DNR (pink MOST or yellow form);Healthcare Power of Attorney  Does patient want to make changes to medical advance directive? No - Patient declined  Copy of [place] in Chart? Yes - validated most recent copy scanned in chart (See row information)  Pre-existing out of facility DNR order (yellow form or pink MOST form) Yellow form placed in chart (order not valid for inpatient use)     Chief Complaint  Patient presents with   Medical Management of Chronic Issues    Patient returns to the clinic to discuss her pain, calcium and ears.     HPI: Patient is a 87 y.o. female seen today for medical management of chronic diseases.    Patient has h/o Hypertension, PVC, PMR off Prednisone right now, h/o Hyperparathyroidism, Spinal Stenosis with Neurogenic Claudication, Collagenous Colitis Vit B12 Def Mild AR AI per cardiology Collagenous colitis uses budesonide as needed   Her active problems today Hypercalcemia Osteoporosis Not taking her Prolia Itching in her ear Chronic   Past Medical History:  Diagnosis Date   Allergy    Arthritis    Back pain    Blepharitis    Cancer ([place])    basal cell on face   Candidal esophagitis (HCC) 2010   Cataract    Chest pain, atypical    Collagenous colitis    Dr. Maurene Capes   Diverticulosis    Vira Agar' corneal dystrophy    GERD (gastroesophageal reflux disease)    Heart murmur    Hiatal hernia 2010   History of fibromyalgia    Hypercalcemia    Hyperlipidemia    Hypertension    IBS (irritable bowel syndrome)    Insomnia    Osteoporosis    Primary hyperparathyroidism ([place]) 03/11/2017   Rectocele    Scoliosis    Shingles 1975   at waistline, no chronic pain.     Spinal stenosis     Vaginal prolapse     Past Surgical History:  Procedure Laterality Date   ABDOMINAL HYSTERECTOMY     BREAST BIOPSY  1964   left benign   CARDIOVASCULAR STRESS TEST  12/03/2007   EF 72%   CATARACT EXTRACTION W/ INTRAOCULAR LENS  IMPLANT, BILATERAL     COLONOSCOPY  04/24/2006   diverticulosis   FLEXIBLE SIGMOIDOSCOPY  07/27/2010   diverticulosis, collagenous colitis   TONSILLECTOMY AND ADENOIDECTOMY     UPPER GASTROINTESTINAL ENDOSCOPY  01/29/2008   esophagitis, hiatal hernia, gastritis   US ECHOCARDIOGRAPHY  10/30/2007    EF 55-60%    Allergies  Allergen Reactions   Actonel [Risedronate Sodium] Other (See Comments)    Joint pain   Amoxicillin Diarrhea   Erythromycin Other (See Comments)    Gi upset   Lipitor [Atorvastatin Calcium]    Monistat [Miconazole]    Zetia [Ezetimibe]     Joint pain    Outpatient Encounter Medications as of 11/09/2020  Medication Sig   acetaminophen (TYLENOL) 500 MG tablet Take 500 mg by mouth 4 (four) times daily as needed.   BIOTIN PO Take 1,500 mg by mouth daily.   BUDESONIDE PO Take by mouth.   Cannabidiol POWD by Does not apply route. This is a liquid roll on   COVID-19 mRNA vaccine, Moderna,  100 MCG/0.5ML injection Inject into the muscle.   cyanocobalamin (,VITAMIN B-12,) 1000 MCG/ML injection Inject 1 mL (1,000 mcg total) into the muscle every 30 (thirty) days.   dexamethasone (DECADRON) 0.1 % ophthalmic solution Place 2 drops into the left eye 2 (two) times daily as needed. Use these drops for you left Ear as needed   estradiol (ESTRACE) 0.1 MG/GM vaginal cream Place 1 Applicatorful vaginally at bedtime.   famotidine (PEPCID) 40 MG tablet TAKE ONE TABLET BY MOUTH TWO TIMES A DAY   flurazepam (DALMANE) 15 MG capsule Take 1 capsule (15 mg total) by mouth at bedtime as needed for sleep.   fluticasone (FLONASE) 50 MCG/ACT nasal spray Place 1 spray into both nostrils daily as needed for allergies or rhinitis.   GuaiFENesin (MUCINEX PO) Take 1  tablet by mouth every 12 (twelve) hours as needed (congestion).    hydrocortisone 2.5 % cream Apply topically 2 (two) times daily.   ipratropium (ATROVENT) 0.03 % nasal spray Place 2 sprays into both nostrils 2 (two) times daily as needed for rhinitis. As directed nasal spray   Melatonin 5 MG TABS Take 1 tablet by mouth at bedtime as needed (Takes an additional as needed when wakes up).    nystatin-triamcinolone (MYCOLOG II) cream Apply 1 application topically at bedtime.   TART CHERRY PO Take 3 capsules by mouth daily.   timolol (BETIMOL) 0.5 % ophthalmic solution Place 1 drop into both eyes daily.   [DISCONTINUED] GLUCOSAMINE-CHONDROITIN PO Take 2 tablets by mouth daily.   [DISCONTINUED] polyethylene glycol (MIRALAX / GLYCOLAX) 17 g packet Take 17 g by mouth every other day.   [DISCONTINUED] senna (SENOKOT) 8.6 MG TABS tablet Take 2 tablets by mouth daily.   No facility-administered encounter medications on file as of 11/09/2020.    Review of Systems:  Review of Systems  Constitutional: Negative.   HENT:  Positive for ear pain.   Respiratory: Negative.    Cardiovascular: Negative.   Gastrointestinal: Negative.   Genitourinary: Negative.   Musculoskeletal:  Positive for gait problem.  Skin: Negative.   Neurological:  Positive for weakness.  Psychiatric/Behavioral: Negative.     Health Maintenance  Topic Date Due   Pneumonia Vaccine 56+ Years old (2 - PPSV23 if available, else PCV20) 11/07/2017   TETANUS/TDAP  01/02/2020   COVID-19 Vaccine (4 - Booster for Moderna series) 07/08/2020   INFLUENZA VACCINE  08/01/2020   DEXA SCAN  09/20/2021   MAMMOGRAM  09/22/2021   Zoster Vaccines- Shingrix  Completed   HPV VACCINES  Aged Out    Physical Exam: Vitals:   11/09/20 1109  BP: (!) 144/92  Pulse: 63  Temp: (!) 97.2 F (36.2 C)  SpO2: 97%  Weight: 108 lb 3.2 oz (49.1 kg)  Height: 5\' 2"  (1.575 m)   Body mass index is 19.79 kg/m. Physical Exam Constitutional: Oriented to  person, place, and time. Well-developed and well-nourished.  HENT:  Head: Normocephalic.  Mouth/Throat: Oropharynx is clear and moist.  Eyes: Pupils are equal, round, and reactive to light.  Neck: Neck supple.  Cardiovascular: Normal rate and normal heart sounds.  No murmur heard. Pulmonary/Chest: Effort normal and breath sounds normal. No respiratory distress. No wheezes. She has no rales.  Abdominal: Soft. Bowel sounds are normal. No distension. There is no tenderness. There is no rebound.  Musculoskeletal: No edema.  Lymphadenopathy: none Neurological: Alert and oriented to person, place, and time.  Skin: Skin is warm and dry.  Psychiatric: Normal mood and affect. Behavior is  normal. Thought content normal.   Labs reviewed: Basic Metabolic Panel: Recent Labs    07/28/20 0000 08/11/20 0000 10/04/20 0000  NA 136* 134* 136*  K 4.8 4.3 4.6  CL 99 97* 99  CO2 29* 26* 24*  BUN 20 21 25*  CREATININE 0.8 0.9 0.8  CALCIUM 11.5* 11.4* 12.0*   Liver Function Tests: Recent Labs    07/28/20 0000  AST 23  ALT 12  ALKPHOS 49  ALBUMIN 4.5   No results for input(s): LIPASE, AMYLASE in the last 8760 hours. No results for input(s): AMMONIA in the last 8760 hours. CBC: Recent Labs    07/28/20 0000  WBC 5.1  HGB 10.8*  HCT 32*  PLT 202   Lipid Panel: No results for input(s): CHOL, HDL, LDLCALC, TRIG, CHOLHDL, LDLDIRECT in the last 8760 hours. Lab Results  Component Value Date   HGBA1C 5.4 09/23/2017    Procedures since last visit: No results found.  Assessment/Plan  Primary hyperparathyroidism ([place]) With Hypercalcemia Has Appointment with Endocrinology Cervicalgia Symptoms better Senile osteoporosis She has missed her Prolia  again  Per reviewing her chart patient has been consistently on Prolia since 2017.  Though there is a note saying that she has been on it since 2014 not sure if that was consistent Her last DEXA scan showed decrease in BMD of right  hip Everything else is stable. I discussed with the patient that she has to stay on Prolia unless she wants to start taking any substitute medication like biphosphonate's.  Which she is not interested.   Will consider after seeing Endocrinologist Chronic otitis externa of both ears, unspecified type Following with ENT Vitamin B12 deficiency Continue Supplements Postmenopause atrophic vaginitis On Estrogen Benign essential hypertension No Meds Follow with Cardiology Colitis On Budesonide Insomnia Continue Dalmane PRn Labs/tests ordered:  * No order type specified * Next appt:  11/21/2020

## 2020-11-28 ENCOUNTER — Encounter: Payer: Self-pay | Admitting: Adult Health

## 2020-11-28 ENCOUNTER — Non-Acute Institutional Stay: Payer: PPO | Admitting: Adult Health

## 2020-11-28 ENCOUNTER — Other Ambulatory Visit: Payer: Self-pay

## 2020-11-28 VITALS — BP 142/78 | HR 76 | Temp 97.5°F | Ht 62.0 in | Wt 110.2 lb

## 2020-11-28 DIAGNOSIS — K5901 Slow transit constipation: Secondary | ICD-10-CM | POA: Diagnosis not present

## 2020-11-28 DIAGNOSIS — E210 Primary hyperparathyroidism: Secondary | ICD-10-CM

## 2020-11-28 DIAGNOSIS — K52831 Collagenous colitis: Secondary | ICD-10-CM | POA: Diagnosis not present

## 2020-11-28 NOTE — Patient Instructions (Signed)
If having constipation try Colace, water, and fiber intake  If not improvement can try Dulcolax per rectum as needed

## 2020-11-28 NOTE — Progress Notes (Signed)
Location:  [facility]  POS: clinic  Provider:  Cindi Carbon, [place] [phone]   Code Status: DNR Goals of Care:  Advanced Directives 11/09/2020  Does Patient Have a Medical Advance Directive? Yes  Type of Advance Directive Out of facility DNR (pink MOST or yellow form);Healthcare Power of Attorney  Does patient want to make changes to medical advance directive? No - Patient declined  Copy of Beth Wise in Chart? Yes - validated most recent copy scanned in chart (See row information)  Pre-existing out of facility DNR order (yellow form or pink MOST form) Yellow form placed in chart (order not valid for inpatient use)     Chief Complaint  Patient presents with   Medical Management of Chronic Issues    Patient returns to the clinic for follow up.     HPI: Patient is a 87 y.o. female seen today because she wanted to talk about what happened at the endocrinologist. She saw Dr. Janese Banks and had labs draw but has not heard back the results. Her Ca was 10.7.   She has a hx of hyperparathyroidism. She is on prolia as well for osteoporosis.   She has had some nausea and constipation. She has trouble passing stool it is hard. She bought miralax and a stool softner but she has not used them. She has tried eating fruit and drinking more water. Then she developed loose stools. She then took budesonide and it improved. She is using it prn for loose stools. She has this med due to a hx of collagenous colitis.   Past Medical History:  Diagnosis Date   Allergy    Arthritis    Back pain    Blepharitis    Cancer (HCC)    basal cell on face   Candidal esophagitis ([place]) 2010   Cataract    Chest pain, atypical    Collagenous colitis    Dr. Maurene Capes   Diverticulosis    Vira Agar' corneal dystrophy    GERD (gastroesophageal reflux disease)    Heart murmur    Hiatal hernia 2010   History of fibromyalgia    Hypercalcemia    Hyperlipidemia    Hypertension     IBS (irritable bowel syndrome)    Insomnia    Osteoporosis    Primary hyperparathyroidism ([place]) 03/11/2017   Rectocele    Scoliosis    Shingles 1975   at waistline, no chronic pain.     Spinal stenosis    Vaginal prolapse     Past Surgical History:  Procedure Laterality Date   ABDOMINAL HYSTERECTOMY     BREAST BIOPSY  1964   left benign   CARDIOVASCULAR STRESS TEST  12/03/2007   EF 72%   CATARACT EXTRACTION W/ INTRAOCULAR LENS  IMPLANT, BILATERAL     COLONOSCOPY  04/24/2006   diverticulosis   FLEXIBLE SIGMOIDOSCOPY  07/27/2010   diverticulosis, collagenous colitis   TONSILLECTOMY AND ADENOIDECTOMY     UPPER GASTROINTESTINAL ENDOSCOPY  01/29/2008   esophagitis, hiatal hernia, gastritis   US ECHOCARDIOGRAPHY  10/30/2007    EF 55-60%    Allergies  Allergen Reactions   Actonel [Risedronate Sodium] Other (See Comments)    Joint pain   Amoxicillin Diarrhea   Erythromycin Other (See Comments)    Gi upset   Lipitor [Atorvastatin Calcium]    Monistat [Miconazole]    Zetia [Ezetimibe]     Joint pain    Outpatient Encounter Medications as of 11/28/2020  Medication Sig  acetaminophen (TYLENOL) 500 MG tablet Take 500 mg by mouth 4 (four) times daily as needed.   BIOTIN PO Take 1,500 mg by mouth daily.   BUDESONIDE PO Take by mouth.   Cannabidiol POWD by Does not apply route. This is a liquid roll on   COVID-19 mRNA vaccine, Moderna, 100 MCG/0.5ML injection Inject into the muscle.   cyanocobalamin (,VITAMIN B-12,) 1000 MCG/ML injection Inject 1 mL (1,000 mcg total) into the muscle every 30 (thirty) days.   dexamethasone (DECADRON) 0.1 % ophthalmic solution Place 2 drops into the left eye 2 (two) times daily as needed. Use these drops for you left Ear as needed   estradiol (ESTRACE) 0.1 MG/GM vaginal cream Place 1 Applicatorful vaginally at bedtime.   famotidine (PEPCID) 40 MG tablet TAKE ONE TABLET BY MOUTH TWO TIMES A DAY   flurazepam (DALMANE) 15 MG capsule Take 1 capsule (15  mg total) by mouth at bedtime as needed for sleep.   fluticasone (FLONASE) 50 MCG/ACT nasal spray Place 1 spray into both nostrils daily as needed for allergies or rhinitis.   GuaiFENesin (MUCINEX PO) Take 1 tablet by mouth every 12 (twelve) hours as needed (congestion).    hydrocortisone 2.5 % cream Apply topically 2 (two) times daily.   ipratropium (ATROVENT) 0.03 % nasal spray Place 2 sprays into both nostrils 2 (two) times daily as needed for rhinitis. As directed nasal spray   Melatonin 5 MG TABS Take 1 tablet by mouth at bedtime as needed (Takes an additional as needed when wakes up).    nystatin-triamcinolone (MYCOLOG II) cream Apply 1 application topically at bedtime.   TART CHERRY PO Take 3 capsules by mouth daily.   timolol (BETIMOL) 0.5 % ophthalmic solution Place 1 drop into both eyes daily.   No facility-administered encounter medications on file as of 11/28/2020.    Review of Systems:  Review of Systems  Constitutional:  Negative for activity change, appetite change, chills, diaphoresis, fatigue, fever and unexpected weight change.  HENT:  Negative for congestion.   Respiratory:  Negative for cough, shortness of breath and wheezing.   Cardiovascular:  Negative for chest pain, palpitations and leg swelling.  Gastrointestinal:  Positive for constipation and nausea. Negative for abdominal distention, abdominal pain and diarrhea.  Genitourinary:  Negative for difficulty urinating and dysuria.  Musculoskeletal:  Positive for gait problem. Negative for arthralgias, back pain, joint swelling and myalgias.  Neurological:  Negative for dizziness, tremors, seizures, syncope, facial asymmetry, speech difficulty, weakness, light-headedness, numbness and headaches.  Psychiatric/Behavioral:  Negative for agitation, behavioral problems and confusion.    Health Maintenance  Topic Date Due   Pneumonia Vaccine 67+ Years old (2 - PPSV23 if available, else PCV20) 11/07/2017   TETANUS/TDAP   01/02/2020   COVID-19 Vaccine (4 - Booster for Moderna series) 07/08/2020   INFLUENZA VACCINE  08/01/2020   DEXA SCAN  09/20/2021   MAMMOGRAM  09/22/2021   Zoster Vaccines- Shingrix  Completed   HPV VACCINES  Aged Out    Physical Exam: Vitals:   11/28/20 1454  BP: (!) 142/78  Pulse: 76  Temp: (!) 97.5 F (36.4 C)  SpO2: 96%  Weight: 110 lb 3.2 oz (50 kg)  Height: 5\' 2"  (1.575 m)   Body mass index is 20.16 kg/m. Physical Exam Vitals and nursing note reviewed.  Constitutional:      General: She is not in acute distress.    Appearance: She is not diaphoretic.  HENT:     Head: Normocephalic and atraumatic.  Neck:     Vascular: No JVD.  Cardiovascular:     Rate and Rhythm: Normal rate and regular rhythm.     Heart sounds: No murmur heard. Pulmonary:     Effort: Pulmonary effort is normal. No respiratory distress.     Breath sounds: Normal breath sounds. No wheezing.  Abdominal:     General: Bowel sounds are normal. There is no distension.     Palpations: Abdomen is soft.     Tenderness: There is no abdominal tenderness.  Musculoskeletal:     Right lower leg: No edema.     Left lower leg: No edema.  Skin:    General: Skin is warm and dry.  Neurological:     Mental Status: She is alert and oriented to person, place, and time.  Psychiatric:        Mood and Affect: Mood normal.    Labs reviewed: Basic Metabolic Panel: Recent Labs    07/28/20 0000 08/11/20 0000 10/04/20 0000  NA 136* 134* 136*  K 4.8 4.3 4.6  CL 99 97* 99  CO2 29* 26* 24*  BUN 20 21 25*  CREATININE 0.8 0.9 0.8  CALCIUM 11.5* 11.4* 12.0*   Liver Function Tests: Recent Labs    07/28/20 0000  AST 23  ALT 12  ALKPHOS 49  ALBUMIN 4.5   No results for input(s): LIPASE, AMYLASE in the last 8760 hours. No results for input(s): AMMONIA in the last 8760 hours. CBC: Recent Labs    07/28/20 0000  WBC 5.1  HGB 10.8*  HCT 32*  PLT 202   Lipid Panel: No results for input(s): CHOL, HDL,  LDLCALC, TRIG, CHOLHDL, LDLDIRECT in the last 8760 hours. Lab Results  Component Value Date   HGBA1C 5.4 09/23/2017    Procedures since last visit: No results found.  Assessment/Plan  1. Primary hyperparathyroidism ([place])  - Ambulatory referral to Endocrinology Pt wants to try another Dr. Did not want to see Dr. Loanne Drilling. Will try Dr. Kelton Pillar per pt request  2. Slow transit constipation Try colace, increased fiber intake, increased water intake Try dulcolax supp qd prn  3. Collagenous colitis Not currently an issue. Has seen Dr. Carlean Purl. She should take the budesonide in an acute flare but not qd prn if one loose stool  Labs/tests ordered:  * No order type specified * Next appt:  01/03/2021   Total time 65min:  time greater than 50% of total time spent doing pt counseling and coordination of care

## 2020-12-05 ENCOUNTER — Other Ambulatory Visit: Payer: Self-pay | Admitting: *Deleted

## 2020-12-05 DIAGNOSIS — J301 Allergic rhinitis due to pollen: Secondary | ICD-10-CM

## 2020-12-05 MED ORDER — IPRATROPIUM BROMIDE 0.03 % NA SOLN: 2.0000 | Freq: Two times a day (BID) | NASAL | 1 refills | Status: DC | PRN

## 2020-12-05 NOTE — Telephone Encounter (Signed)
Pharmacy requested refill

## 2020-12-14 DIAGNOSIS — H6123 Impacted cerumen, bilateral: Secondary | ICD-10-CM | POA: Diagnosis not present

## 2020-12-14 DIAGNOSIS — H903 Sensorineural hearing loss, bilateral: Secondary | ICD-10-CM | POA: Diagnosis not present

## 2020-12-15 DIAGNOSIS — Z85828 Personal history of other malignant neoplasm of skin: Secondary | ICD-10-CM | POA: Diagnosis not present

## 2020-12-15 DIAGNOSIS — L661 Lichen planopilaris: Secondary | ICD-10-CM | POA: Diagnosis not present

## 2020-12-15 DIAGNOSIS — K130 Diseases of lips: Secondary | ICD-10-CM | POA: Diagnosis not present

## 2020-12-28 ENCOUNTER — Other Ambulatory Visit: Payer: Self-pay | Admitting: Internal Medicine

## 2021-01-03 ENCOUNTER — Ambulatory Visit (INDEPENDENT_AMBULATORY_CARE_PROVIDER_SITE_OTHER): Payer: PPO | Admitting: Nurse Practitioner

## 2021-01-03 ENCOUNTER — Other Ambulatory Visit: Payer: Self-pay

## 2021-01-03 ENCOUNTER — Encounter: Payer: Self-pay | Admitting: Nurse Practitioner

## 2021-01-03 DIAGNOSIS — Z0000 Encounter for general adult medical examination without abnormal findings: Secondary | ICD-10-CM | POA: Diagnosis not present

## 2021-01-03 NOTE — Progress Notes (Signed)
Subjective:   Beth Wise is a 88 y.o. female who presents for Medicare Annual (Subsequent) preventive examination.  Review of Systems     Cardiac Risk Factors include: sedentary lifestyle;advanced age (>25men, >41 women);dyslipidemia     Objective:    There were no vitals filed for this visit. There is no height or weight on file to calculate BMI.  Advanced Directives 01/03/2021 11/09/2020 10/17/2020 09/19/2020 08/15/2020 07/20/2020 12/31/2019  Does Patient Have a Medical Advance Directive? Yes Yes Yes Yes Yes Yes Yes  Type of Paramedic of [place];Out of facility DNR (pink MOST or yellow form) Out of facility DNR (pink MOST or yellow form);Healthcare Power of [employer] of facility DNR (pink MOST or yellow form);Healthcare Power of [employer] of facility DNR (pink MOST or yellow form);Healthcare Power of [employer] of facility DNR (pink MOST or yellow form);Healthcare Power of [employer] of facility DNR (pink MOST or yellow form);Healthcare Power of [place];Out of facility DNR (pink MOST or yellow form)  Does patient want to make changes to medical advance directive? No - Patient declined No - Patient declined No - Patient declined No - Patient declined No - Patient declined No - Patient declined No - Patient declined  Copy of [place] in Chart? Yes - validated most recent copy scanned in chart (See row information) Yes - validated most recent copy scanned in chart (See row information) Yes - validated most recent copy scanned in chart (See row information) Yes - validated most recent copy scanned in chart (See row information) Yes - validated most recent copy scanned in chart (See row information) Yes - validated most recent copy scanned in chart (See row information) -  Pre-existing out of facility DNR order (yellow form or pink MOST form) Pink MOST form placed in chart (order not valid for inpatient use);Yellow  form placed in chart (order not valid for inpatient use) Yellow form placed in chart (order not valid for inpatient use) Yellow form placed in chart (order not valid for inpatient use) Yellow form placed in chart (order not valid for inpatient use) Yellow form placed in chart (order not valid for inpatient use) Yellow form placed in chart (order not valid for inpatient use) Yellow form placed in chart (order not valid for inpatient use)    Current Medications (verified) Outpatient Encounter Medications as of 01/03/2021  Medication Sig   acetaminophen (TYLENOL) 500 MG tablet Take 500 mg by mouth 4 (four) times daily as needed.   BUDESONIDE PO Take by mouth.   Cannabidiol POWD by Does not apply route. This is a liquid roll on   cyanocobalamin (,VITAMIN B-12,) 1000 MCG/ML injection Inject 1 mL (1,000 mcg total) into the muscle every 30 (thirty) days.   estradiol (ESTRACE) 0.1 MG/GM vaginal cream Place 1 Applicatorful vaginally at bedtime.   flurazepam (DALMANE) 15 MG capsule Take 1 capsule (15 mg total) by mouth at bedtime as needed for sleep.   fluticasone (FLONASE) 50 MCG/ACT nasal spray PLACE 1 SPRAY TO BOTH NOSTRILS ONCE DAILY AS NEEDED FOR ALLERGIES OR RHINITIS   GuaiFENesin (MUCINEX PO) Take 1 tablet by mouth every 12 (twelve) hours as needed (congestion).    hydrocortisone 2.5 % cream Apply topically 2 (two) times daily.   ipratropium (ATROVENT) 0.03 % nasal spray Place 2 sprays into both nostrils 2 (two) times daily as needed for rhinitis. As directed nasal spray   Melatonin 5 MG TABS Take 1 tablet  by mouth at bedtime as needed (Takes an additional as needed when wakes up).    nystatin-triamcinolone (MYCOLOG II) cream Apply 1 application topically at bedtime.   saccharomyces boulardii (FLORASTOR) 250 MG capsule Take 250 mg by mouth daily.   timolol (BETIMOL) 0.5 % ophthalmic solution Place 1 drop into both eyes daily.   BIOTIN PO Take 1,500 mg by mouth daily. (Patient not taking: Reported on  01/03/2021)   COVID-19 mRNA vaccine, Moderna, 100 MCG/0.5ML injection Inject into the muscle.   PROLIA 60 MG/ML SOSY injection Inject into the skin.   [DISCONTINUED] bisacodyl (DULCOLAX) 10 MG suppository Place 10 mg rectally as needed for moderate constipation.   [DISCONTINUED] dexamethasone (DECADRON) 0.1 % ophthalmic solution Place 2 drops into the left eye 2 (two) times daily as needed. Use these drops for you left Ear as needed   [DISCONTINUED] famotidine (PEPCID) 40 MG tablet TAKE ONE TABLET BY MOUTH TWO TIMES A DAY   [DISCONTINUED] TART CHERRY PO Take 3 capsules by mouth daily.   No facility-administered encounter medications on file as of 01/03/2021.    Allergies (verified) Actonel [risedronate sodium], Amoxicillin, Cinacalcet hcl, Erythromycin, Lipitor [atorvastatin calcium], Monistat [miconazole], and Zetia [ezetimibe]   History: Past Medical History:  Diagnosis Date   Allergy    Arthritis    Back pain    Blepharitis    Cancer ([place])    basal cell on face   Candidal esophagitis ([place]) 2010   Cataract    Chest pain, atypical    Collagenous colitis    Dr. Maurene Capes   Diverticulosis    Vira Agar' corneal dystrophy    GERD (gastroesophageal reflux disease)    Heart murmur    Hiatal hernia 2010   History of fibromyalgia    Hypercalcemia    Hyperlipidemia    Hypertension    IBS (irritable bowel syndrome)    Insomnia    Osteoporosis    Primary hyperparathyroidism ([place]) 03/11/2017   Rectocele    Scoliosis    Shingles 1975   at waistline, no chronic pain.     Spinal stenosis    Vaginal prolapse    Past Surgical History:  Procedure Laterality Date   ABDOMINAL HYSTERECTOMY     BREAST BIOPSY  1964   left benign   CARDIOVASCULAR STRESS TEST  12/03/2007   EF 72%   CATARACT EXTRACTION W/ INTRAOCULAR LENS  IMPLANT, BILATERAL     COLONOSCOPY  04/24/2006   diverticulosis   FLEXIBLE SIGMOIDOSCOPY  07/27/2010   diverticulosis, collagenous colitis   TONSILLECTOMY AND ADENOIDECTOMY      UPPER GASTROINTESTINAL ENDOSCOPY  01/29/2008   esophagitis, hiatal hernia, gastritis   US ECHOCARDIOGRAPHY  10/30/2007    EF 55-60%   Family History  Problem Relation Age of Onset   Breast cancer Mother        died at 66   Stroke Mother    Heart disease Father        died at 13   Cirrhosis Sister        died at 53   Pneumonia Sister        died at 59 months   Heart disease Sister        died at 69   Cancer - Other Sister        stomach; died at 75   Stomach cancer Sister    Heart disease Brother        died at 59   Heart disease Brother  c-diff and sepsis; died at 32   Prostate cancer Son    Colon cancer Neg Hx    Diabetes Neg Hx    Rectal cancer Neg Hx    Esophageal cancer Neg Hx    Social History   Socioeconomic History   Marital status: Widowed    Spouse name: Not on file   Number of children: 2   Years of education: Not on file   Highest education level: Not on file  Occupational History   Occupation: retired    Comment: owned Human resources officer  Tobacco Use   Smoking status: Never   Smokeless tobacco: Never  Vaping Use   Vaping Use: Never used  Substance and Sexual Activity   Alcohol use: Not Currently   Drug use: No   Sexual activity: Never  Other Topics Concern   Not on file  Social History Narrative   Social History      Diet? Regular (shouldn't have excessive amts of acid, hot &spicey foods)      Do you drink/eat things with caffeine? yes      Marital status?       widow                             What year were you married? [place] you live in a house, apartment, assisted living, condo, trailer, etc.? apartment      Is it one or more stories? 1      How many persons live in your home? 1 (self)      Do you have any pets in your home? (please list) no no !!      Highest level of education completed?      Current or past profession: hairdresser      Do you exercise?      little                                Type & how often?  Leg lifts and plank, stretching every day.      Advanced Directives      Do you have a living will?  yes      Do you have a DNR form?         yes                         If not, do you want to discuss one? yes      Do you have signed POA/HPOA for forms? yes      Functional Status      Do you have difficulty bathing or dressing yourself?      Do you have difficulty preparing food or eating?       Do you have difficulty managing your medications?      Do you have difficulty managing your finances?      Do you have difficulty affording your medications?   Social Determinants of Health   Financial Resource Strain: Not on file  Food Insecurity: Not on file  Transportation Needs: Not on file  Physical Activity: Not on file  Stress: Not on file  Social Connections: Not on file    Tobacco Counseling Counseling given: Not Answered   Clinical Intake:  Pre-visit preparation completed: Yes  Pain : No/denies pain     BMI -  recorded: 20.6 Nutritional Status: BMI of 19-24  Normal Nutritional Risks: Nausea/ vomitting/ diarrhea Diabetes: No  How often do you need to have someone help you when you read instructions, pamphlets, or other written materials from your doctor or pharmacy?: 1 - Never  Diabetic?no         Activities of Daily Living In your present state of health, do you have any difficulty performing the following activities: 01/03/2021  Hearing? N  Vision? Y  Difficulty concentrating or making decisions? Y  Walking or climbing stairs? Y  Dressing or bathing? N  Doing errands, shopping? Y  Preparing Food and eating ? N  Using the Toilet? N  In the past six months, have you accidently leaked urine? N  Do you have problems with loss of bowel control? N  Managing your Medications? N  Managing your Finances? N  Housekeeping or managing your Housekeeping? N  Some recent data might be hidden    Patient Care Team: Virgie Dad, MD as PCP - General  (Internal Medicine) Rolm Bookbinder, MD as Consulting Physician (Dermatology) Marti Sleigh, MD as Consulting Physician (Gynecology) Stanford Breed Denice Bors, MD as Consulting Physician (Cardiology) Luberta Mutter, MD as Consulting Physician (Ophthalmology) Jola Baptist, [place] as Referring Physician (Chiropractic Medicine) Neomia Dear (Chiropractic Medicine) Starling Manns, MD (Orthopedic Surgery) Gatha Mayer, MD as Consulting Physician (Gastroenterology)  Indicate any recent Medical Services you may have received from other than [facility] providers in the past year (date may be approximate).     Assessment:   This is a routine wellness examination for Kendy.  Hearing/Vision screen Hearing Screening - Comments:: Patient has no hearing problems. Vision Screening - Comments:: Patient has some vision problems. Patient wears glasses. Patient had last eye exam 6 months ago. She see Dr. Ellie Lunch  Dietary issues and exercise activities discussed: Current Exercise Habits: The patient does not participate in regular exercise at present   Goals Addressed   None   Depression Screen PHQ 2/9 Scores 01/03/2021 12/31/2019 11/11/2019 07/08/2019 04/06/2019 01/07/2019 12/30/2018  PHQ - 2 Score 0 0 0 0 0 0 0  PHQ- 9 Score - - - - - - -    Fall Risk Fall Risk  01/03/2021 11/28/2020 11/09/2020 10/17/2020 09/19/2020  Falls in the past year? 0 0 0 0 0  Number falls in past yr: 0 0 0 0 0  Injury with Fall? 0 0 0 0 -  Risk for fall due to : No Fall Risks No Fall Risks No Fall Risks No Fall Risks -  Follow up Falls evaluation completed Falls evaluation completed Falls evaluation completed Falls evaluation completed Falls evaluation completed    FALL RISK PREVENTION PERTAINING TO THE HOME:  Any stairs in or around the home? No  If so, are there any without handrails? No  Home free of loose throw rugs in walkways, pet beds, electrical cords, etc? Yes  Adequate lighting in your home to reduce risk of falls? Yes    ASSISTIVE DEVICES UTILIZED TO PREVENT FALLS:  Life alert? Yes  Use of a cane, walker or w/c? Yes  Grab bars in the bathroom? Yes  Shower chair or bench in shower? No  Elevated toilet seat or a handicapped toilet? Yes   TIMED UP AND GO:  Was the test performed? No .   Cognitive Function: MMSE - Mini Mental State Exam 12/10/2018 12/06/2017 11/27/2016  Not completed: (No Data) - -  Orientation to time 5 5 5   Orientation to Place  5 5 5   Registration 3 3 3   Attention/ Calculation 5 5 5   Recall 3 2 2   Language- name 2 objects 2 2 2   Language- repeat 1 1 1   Language- follow 3 step command 3 3 3   Language- read & follow direction 1 1 1   Write a sentence 1 1 1   Copy design 1 1 1   Total score 30 29 29      6CIT Screen 01/03/2021 12/31/2019 12/30/2018  What Year? 0 points 0 points 0 points  What month? 0 points 0 points 0 points  What time? 0 points 0 points 0 points  Count back from 20 0 points 0 points 0 points  Months in reverse 0 points 0 points 0 points  Repeat phrase 0 points 0 points 0 points  Total Score 0 0 0    Immunizations Immunization History  Administered Date(s) Administered   Influenza, High Dose Seasonal PF 09/23/2017, 10/10/2018, 10/30/2019   Influenza-Unspecified 09/01/2015, 10/22/2016, 10/28/2020   MODERNA COVID-19 SARS-COV-2 PEDS BIVALENT BOOSTER 6Y-11Y 10/14/2020   Moderna SARS-COV2 Booster Vaccination 05/13/2020   Moderna Sars-Covid-2 Vaccination 01/11/2019, 02/10/2019, 11/17/2019   Pneumococcal Conjugate-13 11/07/2016   Pneumococcal-Unspecified 01/02/2015   Td 01/01/2010   Zoster Recombinat (Shingrix) 02/14/2017, 04/17/2017    TDAP status: Due, Education has been provided regarding the importance of this vaccine. Advised may receive this vaccine at local pharmacy or Health Dept. Aware to provide a copy of the vaccination record if obtained from local pharmacy or Health Dept. Verbalized acceptance and understanding.  Flu Vaccine status: Up to  date  Pneumococcal vaccine status: Up to date  Covid-19 vaccine status: Completed vaccines  Qualifies for Shingles Vaccine? Yes   Zostavax completed Yes   Shingrix Completed?: Yes  Screening Tests Health Maintenance  Topic Date Due   Pneumonia Vaccine 30+ Years old (2 - PPSV23 if available, else PCV20) 11/07/2017   TETANUS/TDAP  01/02/2020   DEXA SCAN  09/20/2021   MAMMOGRAM  09/22/2021   INFLUENZA VACCINE  Completed   COVID-19 Vaccine  Completed   Zoster Vaccines- Shingrix  Completed   HPV VACCINES  Aged Out    Health Maintenance  Health Maintenance Due  Topic Date Due   Pneumonia Vaccine 80+ Years old (2 - PPSV23 if available, else PCV20) 11/07/2017   TETANUS/TDAP  01/02/2020    Colorectal cancer screening: No longer required.   Mammogram status: No longer required due to AGE.  Bone Density status: Completed 09/21/19. Results reflect: Bone density results: OSTEOPOROSIS. Repeat every 2 years.  Lung Cancer Screening: (Low Dose CT Chest recommended if Age 9-80 years, 30 pack-year currently smoking OR have quit w/in 15years.) does not qualify.   Lung Cancer Screening Referral: na  Additional Screening:  Hepatitis C Screening: does not qualify  Vision Screening: Recommended annual ophthalmology exams for early detection of glaucoma and other disorders of the eye. Is the patient up to date with their annual eye exam?  Yes  Who is the provider or what is the name of the office in which the patient attends annual eye exams? [facility] If pt is not established with a provider, would they like to be referred to a provider to establish care? No .   Dental Screening: Recommended annual dental exams for proper oral hygiene  Community Resource Referral / Chronic Care Management: CRR required this visit?  No   CCM required this visit?  No      Plan:     I have personally reviewed and noted  the following in the patients chart:   Medical and social history Use of  alcohol, tobacco or illicit drugs  Current medications and supplements including opioid prescriptions.  Functional ability and status Nutritional status Physical activity Advanced directives List of other physicians Hospitalizations, surgeries, and ER visits in previous 12 months Vitals Screenings to include cognitive, depression, and falls Referrals and appointments  In addition, I have reviewed and discussed with patient certain preventive protocols, quality metrics, and best practice recommendations. A written personalized care plan for preventive services as well as general preventive health recommendations were provided to patient.     Lauree Chandler, NP   01/03/2021    Virtual Visit via Telephone Note  I connected withNAME@ on 01/03/21 at  2:15 PM EST by telephone and verified that I am speaking with the correct person using two identifiers.  Location: Patient: home Provider: [facility]    I discussed the limitations, risks, security and privacy concerns of performing an evaluation and management service by telephone and the availability of in person appointments. I also discussed with the patient that there may be a patient responsible charge related to this service. The patient expressed understanding and agreed to proceed.   I discussed the assessment and treatment plan with the patient. The patient was provided an opportunity to ask questions and all were answered. The patient agreed with the plan and demonstrated an understanding of the instructions.   The patient was advised to call back or seek an in-person evaluation if the symptoms worsen or if the condition fails to improve as anticipated.  I provided 14 minutes of non-face-to-face time during this encounter.  Carlos American. Harle Battiest Avs printed and mailed

## 2021-01-03 NOTE — Patient Instructions (Signed)
Beth Wise , Thank you for taking time to come for your Medicare Wellness Visit. I appreciate your ongoing commitment to your health goals. Please review the following plan we discussed and let me know if I can assist you in the future.   Screening recommendations/referrals: Colonoscopy aged out Mammogram aged out Bone Density up to date- due in September  Recommended yearly ophthalmology/optometry visit for glaucoma screening and checkup Recommended yearly dental visit for hygiene and checkup  Vaccinations: Influenza vaccine up to date Pneumococcal vaccine up to date Tdap vaccine RECOMMENDED- due at this time- to get at local pharmacy Shingles vaccine up to date    Advanced directives: on file.   Conditions/risks identified: advance age  Next appointment: yearly    Preventive Care 86 Years and Older, Female Preventive care refers to lifestyle choices and visits with your health care provider that can promote health and wellness. What does preventive care include? A yearly physical exam. This is also called an annual well check. Dental exams once or twice a year. Routine eye exams. Ask your health care provider how often you should have your eyes checked. Personal lifestyle choices, including: Daily care of your teeth and gums. Regular physical activity. Eating a healthy diet. Avoiding tobacco and drug use. Limiting alcohol use. Practicing safe sex. Taking low-dose aspirin every day. Taking vitamin and mineral supplements as recommended by your health care provider. What happens during an annual well check? The services and screenings done by your health care provider during your annual well check will depend on your age, overall health, lifestyle risk factors, and family history of disease. Counseling  Your health care provider may ask you questions about your: Alcohol use. Tobacco use. Drug use. Emotional well-being. Home and relationship well-being. Sexual  activity. Eating habits. History of falls. Memory and ability to understand (cognition). Work and work Statistician. Reproductive health. Screening  You may have the following tests or measurements: Height, weight, and BMI. Blood pressure. Lipid and cholesterol levels. These may be checked every 5 years, or more frequently if you are over 44 years old. Skin check. Lung cancer screening. You may have this screening every year starting at age 20 if you have a 30-pack-year history of smoking and currently smoke or have quit within the past 15 years. Fecal occult blood test (FOBT) of the stool. You may have this test every year starting at age 79. Flexible sigmoidoscopy or colonoscopy. You may have a sigmoidoscopy every 5 years or a colonoscopy every 10 years starting at age 57. Hepatitis C blood test. Hepatitis B blood test. Sexually transmitted disease (STD) testing. Diabetes screening. This is done by checking your blood sugar (glucose) after you have not eaten for a while (fasting). You may have this done every 1-3 years. Bone density scan. This is done to screen for osteoporosis. You may have this done starting at age 62. Mammogram. This may be done every 1-2 years. Talk to your health care provider about how often you should have regular mammograms. Talk with your health care provider about your test results, treatment options, and if necessary, the need for more tests. Vaccines  Your health care provider may recommend certain vaccines, such as: Influenza vaccine. This is recommended every year. Tetanus, diphtheria, and acellular pertussis (Tdap, Td) vaccine. You may need a Td booster every 10 years. Zoster vaccine. You may need this after age 42. Pneumococcal 13-valent conjugate (PCV13) vaccine. One dose is recommended after age 32. Pneumococcal polysaccharide (PPSV23) vaccine. One dose is  recommended after age 33. Talk to your health care provider about which screenings and vaccines  you need and how often you need them. This information is not intended to replace advice given to you by your health care provider. Make sure you discuss any questions you have with your health care provider. Document Released: 01/14/2015 Document Revised: 09/07/2015 Document Reviewed: 10/19/2014 Elsevier Interactive Patient Education  [address] Prevention in the Home Falls can cause injuries. They can happen to people of all ages. There are many things you can do to make your home safe and to help prevent falls. What can I do on the outside of my home? Regularly fix the edges of walkways and driveways and fix any cracks. Remove anything that might make you trip as you walk through a door, such as a raised step or threshold. Trim any bushes or trees on the path to your home. Use bright outdoor lighting. Clear any walking paths of anything that might make someone trip, such as rocks or tools. Regularly check to see if handrails are loose or broken. Make sure that both sides of any steps have handrails. Any raised decks and porches should have guardrails on the edges. Have any leaves, snow, or ice cleared regularly. Use sand or salt on walking paths during winter. Clean up any spills in your garage right away. This includes oil or grease spills. What can I do in the bathroom? Use night lights. Install grab bars by the toilet and in the tub and shower. Do not use towel bars as grab bars. Use non-skid mats or decals in the tub or shower. If you need to sit down in the shower, use a plastic, non-slip stool. Keep the floor dry. Clean up any water that spills on the floor as soon as it happens. Remove soap buildup in the tub or shower regularly. Attach bath mats securely with double-sided non-slip rug tape. Do not have throw rugs and other things on the floor that can make you trip. What can I do in the bedroom? Use night lights. Make sure that you have a light by your bed that  is easy to reach. Do not use any sheets or blankets that are too big for your bed. They should not hang down onto the floor. Have a firm chair that has side arms. You can use this for support while you get dressed. Do not have throw rugs and other things on the floor that can make you trip. What can I do in the kitchen? Clean up any spills right away. Avoid walking on wet floors. Keep items that you use a lot in easy-to-reach places. If you need to reach something above you, use a strong step stool that has a grab bar. Keep electrical cords out of the way. Do not use floor polish or wax that makes floors slippery. If you must use wax, use non-skid floor wax. Do not have throw rugs and other things on the floor that can make you trip. What can I do with my stairs? Do not leave any items on the stairs. Make sure that there are handrails on both sides of the stairs and use them. Fix handrails that are broken or loose. Make sure that handrails are as long as the stairways. Check any carpeting to make sure that it is firmly attached to the stairs. Fix any carpet that is loose or worn. Avoid having throw rugs at the top or bottom of the stairs. If  you do have throw rugs, attach them to the floor with carpet tape. Make sure that you have a light switch at the top of the stairs and the bottom of the stairs. If you do not have them, ask someone to add them for you. What else can I do to help prevent falls? Wear shoes that: Do not have high heels. Have rubber bottoms. Are comfortable and fit you well. Are closed at the toe. Do not wear sandals. If you use a stepladder: Make sure that it is fully opened. Do not climb a closed stepladder. Make sure that both sides of the stepladder are locked into place. Ask someone to hold it for you, if possible. Clearly mark and make sure that you can see: Any grab bars or handrails. First and last steps. Where the edge of each step is. Use tools that help you  move around (mobility aids) if they are needed. These include: Canes. Walkers. Scooters. Crutches. Turn on the lights when you go into a dark area. Replace any light bulbs as soon as they burn out. Set up your furniture so you have a clear path. Avoid moving your furniture around. If any of your floors are uneven, fix them. If there are any pets around you, be aware of where they are. Review your medicines with your doctor. Some medicines can make you feel dizzy. This can increase your chance of falling. Ask your doctor what other things that you can do to help prevent falls. This information is not intended to replace advice given to you by your health care provider. Make sure you discuss any questions you have with your health care provider. Document Released: 10/14/2008 Document Revised: 05/26/2015 Document Reviewed: 01/22/2014 Elsevier Interactive Patient Education  2017 [employer].

## 2021-01-03 NOTE — Progress Notes (Signed)
This service is provided via telemedicine  No vital signs collected/recorded due to the encounter was a telemedicine visit.   Location of patient (ex: home, work):  Home  Patient consents to a telephone visit:  Yes, see encounter dated 01/03/2021  Location of the provider (ex: office, home):  [place]  Name of any referring provider:  Veleta Miners, MD  Names of all persons participating in the telemedicine service and their role in the encounter:  Sherrie Mustache, Nurse Practitioner, Carroll Kinds, CMA, and patient.   Time spent on call:  9 minutes with medical assistant

## 2021-01-10 DIAGNOSIS — H40053 Ocular hypertension, bilateral: Secondary | ICD-10-CM | POA: Diagnosis not present

## 2021-01-10 DIAGNOSIS — H52203 Unspecified astigmatism, bilateral: Secondary | ICD-10-CM | POA: Diagnosis not present

## 2021-01-10 DIAGNOSIS — H40013 Open angle with borderline findings, low risk, bilateral: Secondary | ICD-10-CM | POA: Diagnosis not present

## 2021-01-10 DIAGNOSIS — H18513 Endothelial corneal dystrophy, bilateral: Secondary | ICD-10-CM | POA: Diagnosis not present

## 2021-01-18 ENCOUNTER — Other Ambulatory Visit: Payer: Self-pay | Admitting: Internal Medicine

## 2021-01-18 DIAGNOSIS — E538 Deficiency of other specified B group vitamins: Secondary | ICD-10-CM

## 2021-01-18 DIAGNOSIS — F419 Anxiety disorder, unspecified: Secondary | ICD-10-CM

## 2021-01-18 MED ORDER — CYANOCOBALAMIN 1000 MCG/ML IJ SOLN: 1000.0000 ug | INTRAMUSCULAR | 3 refills | Status: DC

## 2021-01-18 NOTE — Telephone Encounter (Signed)
Patient has request refill on medication "Flurazepam". Patient last refill dated 06/27/2020. Patient has Non Opioid Contract on file dated 01/13/2019. Medication pend and sent to Royal Hawthorn, NP

## 2021-01-20 ENCOUNTER — Telehealth: Payer: Self-pay | Admitting: Internal Medicine

## 2021-01-20 DIAGNOSIS — F5101 Primary insomnia: Secondary | ICD-10-CM

## 2021-01-20 MED ORDER — TEMAZEPAM 7.5 MG PO CAPS: 7.5000 mg | ORAL_CAPSULE | Freq: Every evening | ORAL | 0 refills | Status: DC | PRN

## 2021-01-20 NOTE — Telephone Encounter (Signed)
I called patient and she stated that it is Flurazepam that the Pharmacy no longer carries.   Patient stated that she has been taking of 20+ years and it has worked well.   Needs something else sent to pharmacy but does not want a large supply of it so she can try it first.   Please Advise.

## 2021-01-20 NOTE — Telephone Encounter (Signed)
Called and discussed with the patient. No further questions. She will call with an update.

## 2021-01-20 NOTE — Telephone Encounter (Signed)
Beth Wise left vm stating pharmacy has informed her that her lorazapam is no longer available.  She would like to have a substitute for that drug or advise   Thank you [name]

## 2021-01-20 NOTE — Telephone Encounter (Signed)
Will send temazepam for 1 week course

## 2021-01-25 ENCOUNTER — Telehealth: Payer: Self-pay | Admitting: Internal Medicine

## 2021-01-25 NOTE — Telephone Encounter (Signed)
Ms Wah left message asking if she could have her B12 levels prior to seeing Dr Lyndel Safe & Dr Rao(endocrinologist) in Feb?  If so, would Dr Lyndel Safe put those orders in & she would have those labs drwan at [facility]?  Please advise, [name]

## 2021-01-26 NOTE — Telephone Encounter (Signed)
She doesn't need the test right now. We will do it next time before her Injection. Thanks

## 2021-01-26 NOTE — Telephone Encounter (Signed)
Called patient. No answer. LMOM to return call.

## 2021-01-26 NOTE — Telephone Encounter (Addendum)
In July level was >2000, patient wants to know if you want B12 or any labs before her visit with you Feb. 22nd so she can  have it done for Dr. Hedwig Morton? Her Next shot is due around Feb. 7th-10 and says she feels better being consistent each month.   To Dr. Lyndel Safe

## 2021-01-29 ENCOUNTER — Encounter (HOSPITAL_BASED_OUTPATIENT_CLINIC_OR_DEPARTMENT_OTHER): Payer: Self-pay

## 2021-01-29 ENCOUNTER — Emergency Department (HOSPITAL_BASED_OUTPATIENT_CLINIC_OR_DEPARTMENT_OTHER): Payer: PPO

## 2021-01-29 ENCOUNTER — Emergency Department (HOSPITAL_BASED_OUTPATIENT_CLINIC_OR_DEPARTMENT_OTHER)
Admission: EM | Admit: 2021-01-29 | Discharge: 2021-01-29 | Disposition: A | Discharge status: Home / Self Care | Payer: PPO | Attending: Emergency Medicine | Admitting: Emergency Medicine

## 2021-01-29 DIAGNOSIS — Z79899 Other long term (current) drug therapy: Secondary | ICD-10-CM | POA: Diagnosis not present

## 2021-01-29 DIAGNOSIS — S0181XA Laceration without foreign body of other part of head, initial encounter: Secondary | ICD-10-CM | POA: Insufficient documentation

## 2021-01-29 DIAGNOSIS — Z23 Encounter for immunization: Secondary | ICD-10-CM | POA: Diagnosis not present

## 2021-01-29 DIAGNOSIS — W19XXXA Unspecified fall, initial encounter: Secondary | ICD-10-CM | POA: Diagnosis not present

## 2021-01-29 DIAGNOSIS — S0990XA Unspecified injury of head, initial encounter: Secondary | ICD-10-CM | POA: Diagnosis not present

## 2021-01-29 DIAGNOSIS — W01198A Fall on same level from slipping, tripping and stumbling with subsequent striking against other object, initial encounter: Secondary | ICD-10-CM | POA: Insufficient documentation

## 2021-01-29 MED ORDER — TETANUS-DIPHTH-ACELL PERTUSSIS 5-2.5-18.5 LF-MCG/0.5 IM SUSY
0.5000 mL | PREFILLED_SYRINGE | Freq: Once | INTRAMUSCULAR | Status: AC
Start: 2021-01-29 — End: 2021-01-29
  Administered 2021-01-29: 0.5 mL via INTRAMUSCULAR
  Filled 2021-01-29: qty 0.5

## 2021-01-29 MED ORDER — LIDOCAINE-EPINEPHRINE (PF) 2 %-1:200000 IJ SOLN
10.0000 mL | Freq: Once | INTRAMUSCULAR | Status: AC
  Administered 2021-01-29: 10 mL
  Filled 2021-01-29: qty 20

## 2021-01-29 NOTE — ED Notes (Signed)
Report called to facility spoke with Grayland Ormond

## 2021-01-29 NOTE — ED Provider Notes (Signed)
[facility] EMERGENCY DEPT Provider Note   CSN: [id number] Arrival date & time: 01/29/21  1757     History  Chief Complaint  Patient presents with   Head Laceration    Beth Wise is a 88 y.o. female who presents to the ED for a laceration on her forehead that occurred while tripping on a rug at her nursing home just prior to arrival.  Patient is here with her daughter who confirms that patient fell due to mechanical reasons.  She fell forward and hit her forehead on a leather recliner creating approximately 3 cm laceration on her left forehead.  Denies LOC.  She denies current pain, vision changes, nausea, vomiting, diarrhea.  She denies neck pain, confusion or AMS. Patient is not on anticoagulants and bleeding was well controlled with direct pressure.  No treatment prior to arrival.  Patient has no other complaints.   Head Laceration Pertinent negatives include no abdominal pain, no headaches and no shortness of breath.      Home Medications Prior to Admission medications   Medication Sig Start Date End Date Taking? Authorizing Provider  acetaminophen (TYLENOL) 500 MG tablet Take 500 mg by mouth 4 (four) times daily as needed.    [provider]  BIOTIN PO Take 1,500 mg by mouth daily. Patient not taking: Reported on 01/03/2021    [provider]  BUDESONIDE PO Take by mouth.    [provider]  Cannabidiol POWD by Does not apply route. This is a liquid roll on    [provider]  COVID-19 mRNA vaccine, Moderna, 100 MCG/0.5ML injection Inject into the muscle. 05/13/20   Carlyle Basques, MD  cyanocobalamin (,VITAMIN B-12,) 1000 MCG/ML injection Inject 1 mL (1,000 mcg total) into the muscle every 30 (thirty) days. 01/18/21   Royal Hawthorn, NP  estradiol (ESTRACE) 0.1 MG/GM vaginal cream Place 1 Applicatorful vaginally at bedtime.    [provider]  fluticasone (FLONASE) 50 MCG/ACT nasal spray PLACE 1 SPRAY TO BOTH NOSTRILS  ONCE DAILY AS NEEDED FOR ALLERGIES OR RHINITIS 12/29/20   Virgie Dad, MD  GuaiFENesin (MUCINEX PO) Take 1 tablet by mouth every 12 (twelve) hours as needed (congestion).     [provider]  hydrocortisone 2.5 % cream Apply topically 2 (two) times daily. 07/22/20   Virgie Dad, MD  ipratropium (ATROVENT) 0.03 % nasal spray Place 2 sprays into both nostrils 2 (two) times daily as needed for rhinitis. As directed nasal spray 12/05/20   Virgie Dad, MD  Melatonin 5 MG TABS Take 1 tablet by mouth at bedtime as needed (Takes an additional as needed when wakes up).     [provider]  nystatin-triamcinolone (MYCOLOG II) cream Apply 1 application topically at bedtime.    [provider]  PROLIA 60 MG/ML SOSY injection Inject into the skin. 11/21/20   [provider]  saccharomyces boulardii (FLORASTOR) 250 MG capsule Take 250 mg by mouth daily.    [provider]  temazepam (RESTORIL) 7.5 MG capsule Take 1 capsule (7.5 mg total) by mouth at bedtime as needed for sleep. 01/20/21   Royal Hawthorn, NP  timolol (BETIMOL) 0.5 % ophthalmic solution Place 1 drop into both eyes daily. 07/22/20   Virgie Dad, MD      Allergies    Actonel [risedronate sodium], Amoxicillin, Cinacalcet hcl, Erythromycin, Lipitor [atorvastatin calcium], Monistat [miconazole], and Zetia [ezetimibe]    Review of Systems   Review of Systems  Constitutional:  Negative  for fever.  HENT: Negative.    Eyes: Negative.   Respiratory:  Negative for shortness of breath.   Cardiovascular: Negative.   Gastrointestinal:  Negative for abdominal pain and vomiting.  Endocrine: Negative.   Genitourinary: Negative.   Musculoskeletal: Negative.   Skin:  Positive for wound. Negative for rash.  Neurological:  Negative for headaches.  All other systems reviewed and are negative.  Physical Exam Updated Vital Signs BP (!) 155/90 (BP Location: Right Arm)    Pulse 78    Temp 97.6 F (36.4  C) (Oral)    Resp 18    SpO2 100%  Physical Exam Vitals and nursing note reviewed.  Constitutional:      General: She is not in acute distress.    Appearance: She is not ill-appearing.  HENT:     Head:     Comments: 3 cm laceration of the left forehead.  No muscle involvement.  No periorbital trauma.  Negative raccoon sign.  Negative battle sign. Eyes:     Conjunctiva/sclera: Conjunctivae normal.  Neck:     Comments: No tenderness to the spinous processes or paraspinal muscles of the C-spine Cardiovascular:     Rate and Rhythm: Normal rate and regular rhythm.     Pulses: Normal pulses.     Heart sounds: No murmur heard. Pulmonary:     Effort: Pulmonary effort is normal. No respiratory distress.     Breath sounds: Normal breath sounds.  Abdominal:     General: Abdomen is flat. There is no distension.     Palpations: Abdomen is soft.     Tenderness: There is no abdominal tenderness.  Musculoskeletal:        General: Normal range of motion.     Cervical back: Normal range of motion.  Skin:    General: Skin is warm and dry.     Capillary Refill: Capillary refill takes less than 2 seconds.  Neurological:     General: No focal deficit present.     Mental Status: She is alert.     Comments: Speech is clear, able to follow commands CN III-XII intact Normal strength in upper and lower extremities bilaterally including dorsiflexion and plantar flexion, strong and equal grip strength Sensation normal to light and sharp touch Moves extremities without ataxia, coordination intact Normal finger to nose and rapid alternating movements No pronator drift    Psychiatric:        Mood and Affect: Mood normal.     ED Results / Procedures / Treatments   Labs (all labs ordered are listed, but only abnormal results are displayed) Labs Reviewed - No data to display  EKG None  Radiology CT Head Wo Contrast  Result Date: 01/29/2021 CLINICAL DATA:  Fall, left forehead injury EXAM: CT  HEAD WITHOUT CONTRAST TECHNIQUE: Contiguous axial images were obtained from the base of the skull through the vertex without intravenous contrast. RADIATION DOSE REDUCTION: This exam was performed according to the departmental dose-optimization program which includes automated exposure control, adjustment of the mA and/or kV according to patient size and/or use of iterative reconstruction technique. COMPARISON:  None. FINDINGS: Brain: No evidence of acute infarction, hemorrhage, hydrocephalus, extra-axial collection or mass lesion/mass effect. Global cortical atrophy. Subcortical white matter and periventricular small vessel ischemic changes. Vascular: No hyperdense vessel or unexpected calcification. Skull: Normal. Negative for fracture or focal lesion. Sinuses/Orbits: The visualized paranasal sinuses are essentially clear. The mastoid air cells are unopacified. Other: None. IMPRESSION: No evidence of acute intracranial abnormality. Atrophy  with small vessel ischemic changes. Electronically Signed   By: Julian Hy M.D.   On: 01/29/2021 19:59    Procedures .Marland KitchenLaceration Repair  Date/Time: 01/29/2021 10:58 PM Performed by: Tonye Pearson, PA-C Authorized by: Tonye Pearson, PA-C   Consent:    Consent obtained:  Verbal   Consent given by:  Patient   Risks discussed:  Infection, need for additional repair, pain, poor cosmetic result and poor wound healing   Alternatives discussed:  No treatment and delayed treatment Universal protocol:    Procedure explained and questions answered to patient or proxy's satisfaction: yes     Relevant documents present and verified: yes     Test results available: yes     Imaging studies available: yes     Required blood products, implants, devices, and special equipment available: yes     Site/side marked: yes     Immediately prior to procedure, a time out was called: yes     Patient identity confirmed:  Verbally with patient Anesthesia:    Anesthesia  method:  Local infiltration   Local anesthetic:  Lidocaine 2% WITH epi Laceration details:    Location:  Face   Face location:  Forehead   Length (cm):  3   Depth (mm):  2 Pre-procedure details:    Preparation:  Imaging obtained to evaluate for foreign bodies Exploration:    Imaging outcome: foreign body not noted   Treatment:    Area cleansed with:  Povidone-iodine   Amount of cleaning:  Extensive   Irrigation solution:  Sterile saline   Irrigation volume:  250   Irrigation method:  Syringe   Visualized foreign bodies/material removed: no     Debridement:  None Skin repair:    Repair method:  Sutures   Suture size:  6-0   Suture material:  Prolene   Suture technique:  Simple interrupted   Number of sutures:  6 Approximation:    Approximation:  Close Repair type:    Repair type:  Simple Post-procedure details:    Dressing:  Antibiotic ointment, non-adherent dressing and bulky dressing   Procedure completion:  Tolerated    Medications Ordered in ED Medications  lidocaine-EPINEPHrine (XYLOCAINE W/EPI) 2 %-1:200000 (PF) injection 10 mL (10 mLs Infiltration Given 01/29/21 2013)  Tdap (BOOSTRIX) injection 0.5 mL (0.5 mLs Intramuscular Given 01/29/21 2112)    ED Course/ Medical Decision Making/ A&P                           Medical Decision Making Amount and/or Complexity of Data Reviewed Radiology: ordered.  Risk Prescription drug management.   History:   CRISTOL ENGDAHL is a 88 y.o. female who presents to the ED for a laceration on her forehead that occurred while tripping on a rug at her nursing home just prior to arrival.  Patient is here with her daughter who confirms that patient fell due to mechanical reasons.  She fell forward and hit her forehead on a leather recliner creating approximately 3 cm laceration on her left forehead.  Denies LOC.  She denies current pain, vision changes, nausea, vomiting, diarrhea.  She denies neck pain, confusion or AMS. Patient is not  on anticoagulants and bleeding was well controlled with direct pressure.  No treatment prior to arrival.  Patient has no other complaints. Additional history obtained from daughter at bedside This patient presents to the ED for concern of head injury, this involves an extensive number of  treatment options, and is a complaint that carries with it a high risk of complications and morbidity.   Emergent considerations for headache include subarachnoid hemorrhage, meningitis, temporal arteritis, glaucoma, cerebral ischemia, carotid/vertebral dissection, intracranial tumor, Venous sinus thrombosis, carbon monoxide poisoning, acute or chronic subdural hemorrhage.  Other considerations include: Migraine, Cluster headache, Hypertension, Caffeine, alcohol, or drug withdrawal, Pseudotumor cerebri, Arteriovenous malformation, Head injury, Neurocysticercosis, Post-lumbar puncture, Preeclampsia, Tension headache, Sinusitis, Cervical arthritis, Refractive error causing strain, Dental abscess, Otitis media, Temporomandibular joint syndrome, Depression, Somatoform disorder (eg, somatization) Trigeminal neuralgia, Glossopharyngeal neuralgia.  Initial impression:  Patient is pleasant, cooperative.  She is in no acute distress nontoxic-appearing.  Bleeding well controlled with direct pressure prior to my evaluation.  Neuro exam was normal.  Will obtain head CT to rule out intracranial bleed prior to laceration repair.   Imaging Studies ordered:  I ordered imaging studies including CT head without contrast which was negative for acute traumatic injury or intracranial bleed I independently visualized and interpreted imaging and I agree with the radiologist interpretation. Decisions made regarding results are detailed in the ED course and/or initial impression section above.  Disposition:  After consideration of the diagnostic results, physical exam, history and the patients response to treatment feel that the patent would  benefit from discharge with strict return precautions.   Facial laceration: Pressure irrigation performed. Wound explored and base of wound visualized in a bloodless field without evidence of foreign body.  Laceration occurred < 8 hours prior to repair which was well tolerated.  Tdap updated.  Pt has  no comorbidities to effect normal wound healing. Pt discharged  without antibiotics.  Discussed suture home care with patient and answered questions. Pt to follow-up for wound check and suture removal in 7 days; they are to return to the ED sooner for signs of infection. Pt is hemodynamically stable with no complaints prior to dc.   Final Clinical Impression(s) / ED Diagnoses Final diagnoses:  Facial laceration, initial encounter    Rx / DC Orders ED Discharge Orders     None         Rodena Piety 01/29/21 2303    Fredia Sorrow, MD 01/29/21 2330

## 2021-01-29 NOTE — Discharge Instructions (Signed)
1. Medications: Tylenol or ibuprofen for pain, usual home medications 2. Treatment: ice for swelling, keep wound clean with warm soap and water and keep bandage dry, do not submerge in water for 24 hours 3. Follow Up: Please return or follow up with primary care provider in 7 days to have your stitches/staples removed or sooner if you have concerns. Return to the emergency department for increased redness, drainage of pus from the wound, or fevers/chills.   WOUND CARE  Keep area clean and dry for 24 hours. Do not remove bandage, if applied.  After 24 hours, remove bandage and wash wound gently with mild soap and warm water. Reapply a new bandage after cleaning wound, if directed.   Continue daily cleansing with soap and water until stitches/staples are removed.  Do not apply any ointments or creams to the wound while stitches/staples are in place, as this may cause delayed healing. Return if you experience any of the following signs of infection: Swelling, redness, pus drainage, streaking, fever >101.0 F  Return if you experience excessive bleeding that does not stop after 15-20 minutes of constant, firm pressure.  

## 2021-01-29 NOTE — ED Triage Notes (Signed)
She tells [place] she "tripped on a rug" at [facility] thereby striking her left forehead area against a "leather recliner". She has a 1.2 inch lac. At left upper forehead which is minimally bleeding now. She denies l.o.c. and she denies any neck pain. Her daughter is with her.

## 2021-02-02 ENCOUNTER — Telehealth: Payer: Self-pay | Admitting: Internal Medicine

## 2021-02-02 NOTE — Telephone Encounter (Signed)
I have told [facility] Nurse to schedule her for Blood work to check her levels  next week.

## 2021-02-02 NOTE — Telephone Encounter (Signed)
Spoke with Ms Hartney with morning & she hasn't heard back from Dr Lyndel Safe or [place] regarding descion on labwork and she actually didn't want another injection until we are aware of her current levels.  Ms Morro states the B12 should be due early Feb.  Please advise [name]

## 2021-02-08 DIAGNOSIS — M810 Age-related osteoporosis without current pathological fracture: Secondary | ICD-10-CM | POA: Diagnosis not present

## 2021-02-08 DIAGNOSIS — E210 Primary hyperparathyroidism: Secondary | ICD-10-CM | POA: Diagnosis not present

## 2021-02-15 DIAGNOSIS — M79652 Pain in left thigh: Secondary | ICD-10-CM | POA: Diagnosis not present

## 2021-02-15 DIAGNOSIS — E210 Primary hyperparathyroidism: Secondary | ICD-10-CM | POA: Diagnosis not present

## 2021-02-15 DIAGNOSIS — M810 Age-related osteoporosis without current pathological fracture: Secondary | ICD-10-CM | POA: Diagnosis not present

## 2021-02-22 ENCOUNTER — Other Ambulatory Visit: Payer: Self-pay

## 2021-02-22 ENCOUNTER — Non-Acute Institutional Stay: Payer: PPO | Admitting: Internal Medicine

## 2021-02-22 ENCOUNTER — Encounter: Payer: Self-pay | Admitting: Internal Medicine

## 2021-02-22 VITALS — BP 134/76 | HR 62 | Temp 98.4°F | Ht 62.0 in | Wt 107.0 lb

## 2021-02-22 DIAGNOSIS — N952 Postmenopausal atrophic vaginitis: Secondary | ICD-10-CM | POA: Diagnosis not present

## 2021-02-22 DIAGNOSIS — E538 Deficiency of other specified B group vitamins: Secondary | ICD-10-CM | POA: Diagnosis not present

## 2021-02-22 DIAGNOSIS — E210 Primary hyperparathyroidism: Secondary | ICD-10-CM | POA: Diagnosis not present

## 2021-02-22 DIAGNOSIS — W19XXXS Unspecified fall, sequela: Secondary | ICD-10-CM

## 2021-02-22 DIAGNOSIS — I10 Essential (primary) hypertension: Secondary | ICD-10-CM

## 2021-02-22 DIAGNOSIS — M810 Age-related osteoporosis without current pathological fracture: Secondary | ICD-10-CM

## 2021-02-22 DIAGNOSIS — K52831 Collagenous colitis: Secondary | ICD-10-CM

## 2021-02-22 DIAGNOSIS — H6063 Unspecified chronic otitis externa, bilateral: Secondary | ICD-10-CM

## 2021-02-22 DIAGNOSIS — F5101 Primary insomnia: Secondary | ICD-10-CM

## 2021-02-22 DIAGNOSIS — M542 Cervicalgia: Secondary | ICD-10-CM

## 2021-02-23 NOTE — Progress Notes (Signed)
Location:  Occupational psychologist of Service:  Clinic (12)  Provider: Virgie Dad   Code Status:  Goals of Care:  Advanced Directives 02/22/2021  Does Patient Have a Medical Advance Directive? Yes  Type of Paramedic of [place];Out of facility DNR (pink MOST or yellow form)  Does patient want to make changes to medical advance directive? No - Patient declined  Copy of [place] in Chart? Yes - validated most recent copy scanned in chart (See row information)  Would patient like information on creating a medical advance directive? -  Pre-existing out of facility DNR order (yellow form or pink MOST form) Pink MOST form placed in chart (order not valid for inpatient use);Yellow form placed in chart (order not valid for inpatient use)     Chief Complaint  Patient presents with   Medical Management of Chronic Issues   Quality Metric Gaps    Patient states she does not need PCV. None showing in matrix or NCIR. Last documented was 2017 and 2018.    HPI: Patient is a 88 y.o. female seen today for medical management of chronic diseases.     Patient has h/o Hypertension, PVC,  PMR off Prednisone right now,  h/o Hyperparathyroidism,  Spinal Stenosis with Neurogenic Claudication,  Vit B12 Def Mild AR AI per cardiology Collagenous colitis uses budesonide as needed  She  is stable Her weight is stable Walks with her walker  Had fall on 01/29/21 Foot got stuck in Rug Got Laceration and now the wound has healed really well No Dizziness. No more falls since then Cognitively doing well. Has daughter in town who helps her Son in [place] had diagnosis of Prostate cancer  Past Medical History:  Diagnosis Date   Allergy    Arthritis    Back pain    Blepharitis    Cancer ([place])    basal cell on face   Candidal esophagitis ([place]) 2010   Cataract    Chest pain, atypical    Collagenous colitis    Dr. Maurene Capes    Diverticulosis    Vira Agar' corneal dystrophy    GERD (gastroesophageal reflux disease)    Heart murmur    Hiatal hernia 2010   History of fibromyalgia    Hypercalcemia    Hyperlipidemia    Hypertension    IBS (irritable bowel syndrome)    Insomnia    Osteoporosis    Primary hyperparathyroidism ([place]) 03/11/2017   Rectocele    Scoliosis    Shingles 1975   at waistline, no chronic pain.     Spinal stenosis    Vaginal prolapse     Past Surgical History:  Procedure Laterality Date   ABDOMINAL HYSTERECTOMY     BREAST BIOPSY  1964   left benign   CARDIOVASCULAR STRESS TEST  12/03/2007   EF 72%   CATARACT EXTRACTION W/ INTRAOCULAR LENS  IMPLANT, BILATERAL     COLONOSCOPY  04/24/2006   diverticulosis   FLEXIBLE SIGMOIDOSCOPY  07/27/2010   diverticulosis, collagenous colitis   TONSILLECTOMY AND ADENOIDECTOMY     UPPER GASTROINTESTINAL ENDOSCOPY  01/29/2008   esophagitis, hiatal hernia, gastritis   US ECHOCARDIOGRAPHY  10/30/2007    EF 55-60%    Allergies  Allergen Reactions   Actonel [Risedronate Sodium] Other (See Comments)    Joint pain   Amoxicillin Diarrhea   Cinacalcet Hcl Diarrhea   Erythromycin Other (See Comments)    Gi upset   Lipitor [Atorvastatin  Calcium]    Monistat [Miconazole]    Zetia [Ezetimibe]     Joint pain    Outpatient Encounter Medications as of 02/22/2021  Medication Sig   acetaminophen (TYLENOL) 500 MG tablet Take 500 mg by mouth 4 (four) times daily as needed.   BIOTIN PO Take 1,500 mg by mouth daily.   BUDESONIDE PO Take by mouth.   Cannabidiol POWD by Does not apply route. This is a liquid roll on   COVID-19 mRNA vaccine, Moderna, 100 MCG/0.5ML injection Inject into the muscle.   cyanocobalamin (,VITAMIN B-12,) 1000 MCG/ML injection Inject 1 mL (1,000 mcg total) into the muscle every 30 (thirty) days.   estradiol (ESTRACE) 0.1 MG/GM vaginal cream Place 1 Applicatorful vaginally at bedtime.   fluticasone (FLONASE) 50 MCG/ACT nasal spray Place 1  spray into both nostrils daily.   GuaiFENesin (MUCINEX PO) Take 1 tablet by mouth every 12 (twelve) hours as needed (congestion).    hydrocortisone 2.5 % cream Apply topically 2 (two) times daily.   ipratropium (ATROVENT) 0.03 % nasal spray Place 2 sprays into both nostrils 2 (two) times daily as needed for rhinitis. As directed nasal spray   Melatonin 5 MG TABS Take 1 tablet by mouth at bedtime as needed (Takes an additional as needed when wakes up).    Polyethyl Glycol-Propyl Glycol (SYSTANE) 0.4-0.3 % GEL ophthalmic gel Place 1 application into both eyes daily.   PROLIA 60 MG/ML SOSY injection Inject into the skin.   saccharomyces boulardii (FLORASTOR) 250 MG capsule Take 250 mg by mouth daily.   tetrahydrozoline 0.05 % ophthalmic solution Place 1 drop into both eyes daily.   timolol (BETIMOL) 0.5 % ophthalmic solution Place 1 drop into both eyes daily.   triamcinolone cream (KENALOG) 0.1 % Apply 1 application topically. 3 times a week   [DISCONTINUED] fluticasone (FLONASE) 50 MCG/ACT nasal spray PLACE 1 SPRAY TO BOTH NOSTRILS ONCE DAILY AS NEEDED FOR ALLERGIES OR RHINITIS   [DISCONTINUED] nystatin-triamcinolone (MYCOLOG II) cream Apply 1 application topically at bedtime.   [DISCONTINUED] temazepam (RESTORIL) 7.5 MG capsule Take 1 capsule (7.5 mg total) by mouth at bedtime as needed for sleep.   No facility-administered encounter medications on file as of 02/22/2021.    Review of Systems:  Review of Systems  Constitutional:  Negative for activity change and appetite change.  HENT: Negative.    Respiratory:  Negative for cough and shortness of breath.   Cardiovascular:  Negative for leg swelling.  Gastrointestinal:  Negative for constipation.  Genitourinary: Negative.   Musculoskeletal:  Negative for arthralgias, gait problem and myalgias.  Skin: Negative.   Neurological:  Negative for dizziness and weakness.  Psychiatric/Behavioral:  Negative for confusion, dysphoric mood and sleep  disturbance.    Health Maintenance  Topic Date Due   Pneumonia Vaccine 72+ Years old (2 - PPSV23 if available, else PCV20) 11/07/2017   DEXA SCAN  09/20/2021   MAMMOGRAM  09/22/2021   TETANUS/TDAP  01/30/2031   INFLUENZA VACCINE  Completed   COVID-19 Vaccine  Completed   Zoster Vaccines- Shingrix  Completed   HPV VACCINES  Aged Out    Physical Exam: Vitals:   02/22/21 1005  BP: 134/76  Pulse: 62  Temp: 98.4 F (36.9 C)  SpO2: 100%  Weight: 107 lb (48.5 kg)  Height: 5\' 2"  (1.575 m)   Body mass index is 19.57 kg/m. Physical Exam Vitals reviewed.  Constitutional:      Appearance: Normal appearance.  HENT:     Head: Normocephalic.  Nose: Nose normal.     Mouth/Throat:     Mouth: Mucous membranes are moist.     Pharynx: Oropharynx is clear.  Eyes:     Pupils: Pupils are equal, round, and reactive to light.  Cardiovascular:     Rate and Rhythm: Normal rate and regular rhythm.     Pulses: Normal pulses.     Heart sounds: Normal heart sounds. No murmur heard. Pulmonary:     Effort: Pulmonary effort is normal.     Breath sounds: Normal breath sounds.  Abdominal:     General: Abdomen is flat. Bowel sounds are normal.     Palpations: Abdomen is soft.  Musculoskeletal:        General: No swelling.     Cervical back: Neck supple.  Skin:    General: Skin is warm.  Neurological:     General: No focal deficit present.     Mental Status: She is alert and oriented to person, place, and time.  Psychiatric:        Mood and Affect: Mood normal.        Thought Content: Thought content normal.    Labs reviewed: Basic Metabolic Panel: Recent Labs    07/28/20 0000 08/11/20 0000 10/04/20 0000  NA 136* 134* 136*  K 4.8 4.3 4.6  CL 99 97* 99  CO2 29* 26* 24*  BUN 20 21 25*  CREATININE 0.8 0.9 0.8  CALCIUM 11.5* 11.4* 12.0*   Liver Function Tests: Recent Labs    07/28/20 0000  AST 23  ALT 12  ALKPHOS 49  ALBUMIN 4.5   No results for input(s): LIPASE,  AMYLASE in the last 8760 hours. No results for input(s): AMMONIA in the last 8760 hours. CBC: Recent Labs    07/28/20 0000  WBC 5.1  HGB 10.8*  HCT 32*  PLT 202   Lipid Panel: No results for input(s): CHOL, HDL, LDLCALC, TRIG, CHOLHDL, LDLDIRECT in the last 8760 hours. Lab Results  Component Value Date   HGBA1C 5.4 09/23/2017    Procedures since last visit: CT Head Wo Contrast  Result Date: 01/29/2021 CLINICAL DATA:  Fall, left forehead injury EXAM: CT HEAD WITHOUT CONTRAST TECHNIQUE: Contiguous axial images were obtained from the base of the skull through the vertex without intravenous contrast. RADIATION DOSE REDUCTION: This exam was performed according to the departmental dose-optimization program which includes automated exposure control, adjustment of the mA and/or kV according to patient size and/or use of iterative reconstruction technique. COMPARISON:  None. FINDINGS: Brain: No evidence of acute infarction, hemorrhage, hydrocephalus, extra-axial collection or mass lesion/mass effect. Global cortical atrophy. Subcortical white matter and periventricular small vessel ischemic changes. Vascular: No hyperdense vessel or unexpected calcification. Skull: Normal. Negative for fracture or focal lesion. Sinuses/Orbits: The visualized paranasal sinuses are essentially clear. The mastoid air cells are unopacified. Other: None. IMPRESSION: No evidence of acute intracranial abnormality. Atrophy with small vessel ischemic changes. Electronically Signed   By: Julian Hy M.D.   On: 01/29/2021 19:59    Assessment/Plan 1. Primary hyperparathyroidism ([place]) Did not tolerate Cinacalcet Her Calcium in Dr Janese Banks office was Normal after she got her Prolia shot No More work up for now Follow with her  2. Collagenous colitis Takes Budesonide as needed  3. Fall, sequela Doing well Referal To therapy to evaluate Gait  4. Senile osteoporosis On Prolia  5. Chronic otitis externa of both ears,  unspecified type Follows with ENT  6. Vitamin B12 deficiency On Injections I have told  her to do it Every other month but she says she feels better this was   7. Cervicalgia Symptoms are better  8. Benign essential hypertension Not on any meds  9. Primary insomnia Melatonin   10. Postmenopause atrophic vaginitis Estrogen cream  11 Rhinitits On Atrovent and Flonase spry  Labs/tests ordered:  * No order type specified *  Virgie Dad, MD

## 2021-02-28 ENCOUNTER — Other Ambulatory Visit (HOSPITAL_BASED_OUTPATIENT_CLINIC_OR_DEPARTMENT_OTHER): Payer: Self-pay

## 2021-02-28 DIAGNOSIS — M4126 Other idiopathic scoliosis, lumbar region: Secondary | ICD-10-CM | POA: Diagnosis not present

## 2021-02-28 DIAGNOSIS — M5459 Other low back pain: Secondary | ICD-10-CM | POA: Diagnosis not present

## 2021-02-28 DIAGNOSIS — M2569 Stiffness of other specified joint, not elsewhere classified: Secondary | ICD-10-CM | POA: Diagnosis not present

## 2021-02-28 DIAGNOSIS — R278 Other lack of coordination: Secondary | ICD-10-CM | POA: Diagnosis not present

## 2021-02-28 DIAGNOSIS — Z9181 History of falling: Secondary | ICD-10-CM | POA: Diagnosis not present

## 2021-02-28 DIAGNOSIS — R293 Abnormal posture: Secondary | ICD-10-CM | POA: Diagnosis not present

## 2021-02-28 DIAGNOSIS — M48061 Spinal stenosis, lumbar region without neurogenic claudication: Secondary | ICD-10-CM | POA: Diagnosis not present

## 2021-02-28 DIAGNOSIS — R2689 Other abnormalities of gait and mobility: Secondary | ICD-10-CM | POA: Diagnosis not present

## 2021-02-28 DIAGNOSIS — M4185 Other forms of scoliosis, thoracolumbar region: Secondary | ICD-10-CM | POA: Diagnosis not present

## 2021-03-01 ENCOUNTER — Encounter: Payer: Self-pay | Admitting: Internal Medicine

## 2021-03-01 ENCOUNTER — Non-Acute Institutional Stay: Payer: PPO | Admitting: Internal Medicine

## 2021-03-01 ENCOUNTER — Other Ambulatory Visit: Payer: Self-pay

## 2021-03-01 VITALS — BP 164/90 | HR 66 | Temp 97.7°F | Ht 62.0 in | Wt 107.6 lb

## 2021-03-01 DIAGNOSIS — Z4802 Encounter for removal of sutures: Secondary | ICD-10-CM | POA: Diagnosis not present

## 2021-03-01 DIAGNOSIS — Z9181 History of falling: Secondary | ICD-10-CM | POA: Diagnosis not present

## 2021-03-01 NOTE — Progress Notes (Signed)
? ?Location: [place] ?  ?Place of Service:  SNF (31) ? ?Provider:  ? ?Code Status:  ?Goals of Care:  ?Advanced Directives 02/22/2021  ?Does Patient Have a Medical Advance Directive? Yes  ?Type of Paramedic of [place];Out of facility DNR (pink MOST or yellow form)  ?Does patient want to make changes to medical advance directive? No - Patient declined  ?Copy of [place] in Chart? Yes - validated most recent copy scanned in chart (See row information)  ?Would patient like information on creating a medical advance directive? -  ?Pre-existing out of facility DNR order (yellow form or pink MOST form) Pink MOST form placed in chart (order not valid for inpatient use);Yellow form placed in chart (order not valid for inpatient use)  ? ? ? ?Chief Complaint  ?Patient presents with  ? Acute Visit  ?  Removal of embedded suture  ? ? ?HPI: Patient is a 88 y.o. female seen today for an acute visit for Embedded Suture ? ? Patient has h/o Hypertension, PVC,  ?PMR off Prednisone right now, ? h/o Hyperparathyroidism,  ?Spinal Stenosis with Neurogenic Claudication,  ?Vit B12 Def ?Mild AR AI per cardiology ?Collagenous colitis uses budesonide as needed ? ?Had fall on 01/29/21 ?Foot got stuck in Rug ?Got Laceration and needed Sutures ?Facility Nurse was able to remove most of the sutures but patient had one which was embedded and would not come out ?She was here for removal ? ?Past Medical History:  ?Diagnosis Date  ? Allergy   ? Arthritis   ? Back pain   ? Blepharitis   ? Cancer [facility])   ? basal cell on face  ? Candidal esophagitis ([place]) 2010  ? Cataract   ? Chest pain, atypical   ? Collagenous colitis   ? Dr. Maurene Capes  ? Diverticulosis   ? Fuchs' corneal dystrophy   ? GERD (gastroesophageal reflux disease)   ? Heart murmur   ? Hiatal hernia 2010  ? History of fibromyalgia   ? Hypercalcemia   ? Hyperlipidemia   ? Hypertension   ? IBS (irritable bowel syndrome)   ?  Insomnia   ? Osteoporosis   ? Primary hyperparathyroidism ([place]) 03/11/2017  ? Rectocele   ? Scoliosis   ? Shingles 1975  ? at waistline, no chronic pain.    ? Spinal stenosis   ? Vaginal prolapse   ? ? ?Past Surgical History:  ?Procedure Laterality Date  ? ABDOMINAL HYSTERECTOMY    ? BREAST BIOPSY  1964  ? left benign  ? CARDIOVASCULAR STRESS TEST  12/03/2007  ? EF 72%  ? CATARACT EXTRACTION W/ INTRAOCULAR LENS  IMPLANT, BILATERAL    ? COLONOSCOPY  04/24/2006  ? diverticulosis  ? FLEXIBLE SIGMOIDOSCOPY  07/27/2010  ? diverticulosis, collagenous colitis  ? TONSILLECTOMY AND ADENOIDECTOMY    ? UPPER GASTROINTESTINAL ENDOSCOPY  01/29/2008  ? esophagitis, hiatal hernia, gastritis  ? US ECHOCARDIOGRAPHY  10/30/2007  ?  EF 55-60%  ? ? ?Allergies  ?Allergen Reactions  ? Actonel [Risedronate Sodium] Other (See Comments)  ?  Joint pain  ? Amoxicillin Diarrhea  ? Cinacalcet Hcl Diarrhea  ? Erythromycin Other (See Comments)  ?  Gi upset  ? Lipitor [Atorvastatin Calcium]   ? Monistat [Miconazole]   ? Zetia [Ezetimibe]   ?  Joint pain  ? ? ?Outpatient Encounter Medications as of 03/01/2021  ?Medication Sig  ? acetaminophen (TYLENOL) 500 MG tablet Take 500 mg by mouth 4 (four) times  daily as needed.  ? BIOTIN PO Take 1,500 mg by mouth daily.  ? BUDESONIDE PO Take by mouth.  ? Cannabidiol POWD by Does not apply route. This is a liquid roll on  ? COVID-19 mRNA vaccine, Moderna, 100 MCG/0.5ML injection Inject into the muscle.  ? cyanocobalamin (,VITAMIN B-12,) 1000 MCG/ML injection Inject 1 mL (1,000 mcg total) into the muscle every 30 (thirty) days.  ? estradiol (ESTRACE) 0.1 MG/GM vaginal cream Place 1 Applicatorful vaginally at bedtime.  ? fluticasone (FLONASE) 50 MCG/ACT nasal spray Place 1 spray into both nostrils daily.  ? GuaiFENesin (MUCINEX PO) Take 1 tablet by mouth every 12 (twelve) hours as needed (congestion).   ? hydrocortisone 2.5 % cream Apply topically 2 (two) times daily.  ? ipratropium (ATROVENT) 0.03 % nasal spray Place  2 sprays into both nostrils 2 (two) times daily as needed for rhinitis. As directed nasal spray  ? Melatonin 5 MG TABS Take 1 tablet by mouth at bedtime as needed (Takes an additional as needed when wakes up).   ? Polyethyl Glycol-Propyl Glycol (SYSTANE) 0.4-0.3 % GEL ophthalmic gel Place 1 application into both eyes daily.  ? PROLIA 60 MG/ML SOSY injection Inject into the skin.  ? saccharomyces boulardii (FLORASTOR) 250 MG capsule Take 250 mg by mouth daily.  ? tetrahydrozoline 0.05 % ophthalmic solution Place 1 drop into both eyes daily.  ? timolol (BETIMOL) 0.5 % ophthalmic solution Place 1 drop into both eyes daily.  ? triamcinolone cream (KENALOG) 0.1 % Apply 1 application topically. 3 times a week  ? ?No facility-administered encounter medications on file as of 03/01/2021.  ? ? ?Review of Systems:  ?Review of Systems  ?Constitutional:  Negative for activity change and appetite change.  ?HENT: Negative.    ?Respiratory:  Negative for cough and shortness of breath.   ?Cardiovascular:  Negative for leg swelling.  ?Gastrointestinal:  Negative for constipation.  ?Genitourinary: Negative.   ?Musculoskeletal:  Negative for arthralgias, gait problem and myalgias.  ?Skin: Negative.   ?Neurological:  Negative for dizziness and weakness.  ?Psychiatric/Behavioral:  Negative for confusion, dysphoric mood and sleep disturbance.   ? ?Health Maintenance  ?Topic Date Due  ? Pneumonia Vaccine 36+ Years old (2 - PPSV23 if available, else PCV20) 11/07/2017  ? DEXA SCAN  09/20/2021  ? MAMMOGRAM  09/22/2021  ? TETANUS/TDAP  01/30/2031  ? INFLUENZA VACCINE  Completed  ? COVID-19 Vaccine  Completed  ? Zoster Vaccines- Shingrix  Completed  ? HPV VACCINES  Aged Out  ? ? ?Physical Exam: ?Vitals:  ? 03/01/21 0928  ?BP: (!) 164/90  ?Pulse: 66  ?Temp: 97.7 ?F (36.5 ?C)  ?SpO2: 100%  ?Weight: 107 lb 9.6 oz (48.8 kg)  ?Height: 5\' 2"  (1.575 m)  ? ?Body mass index is 19.68 kg/m?Marland Kitchen ?Physical Exam ?Vitals reviewed.  ?Constitutional:   ?    Appearance: Normal appearance.  ?HENT:  ?   Head: Normocephalic.  ?   Comments: Embedded Suture in her Forehead ?   Nose: Nose normal.  ?   Mouth/Throat:  ?   Mouth: Mucous membranes are moist.  ?   Pharynx: Oropharynx is clear.  ?Eyes:  ?   Pupils: Pupils are equal, round, and reactive to light.  ?Cardiovascular:  ?   Rate and Rhythm: Normal rate and regular rhythm.  ?   Pulses: Normal pulses.  ?   Heart sounds: Normal heart sounds. No murmur heard. ?Pulmonary:  ?   Effort: Pulmonary effort is normal.  ?  Breath sounds: Normal breath sounds.  ?Abdominal:  ?   General: Abdomen is flat. Bowel sounds are normal.  ?   Palpations: Abdomen is soft.  ?Musculoskeletal:     ?   General: No swelling.  ?   Cervical back: Neck supple.  ?Skin: ?   General: Skin is warm.  ?Neurological:  ?   General: No focal deficit present.  ?   Mental Status: She is alert and oriented to person, place, and time.  ?Psychiatric:     ?   Mood and Affect: Mood normal.     ?   Thought Content: Thought content normal.  ? ? ?Labs reviewed: ?Basic Metabolic Panel: ?Recent Labs  ?  07/28/20 ?0000 08/11/20 ?0000 10/04/20 ?0000  ?NA 136* 134* 136*  ?K 4.8 4.3 4.6  ?CL 99 97* 99  ?CO2 29* 26* 24*  ?BUN 20 21 25*  ?CREATININE 0.8 0.9 0.8  ?CALCIUM 11.5* 11.4* 12.0*  ? ?Liver Function Tests: ?Recent Labs  ?  07/28/20 ?0000  ?AST 23  ?ALT 12  ?ALKPHOS 49  ?ALBUMIN 4.5  ? ?No results for input(s): LIPASE, AMYLASE in the last 8760 hours. ?No results for input(s): AMMONIA in the last 8760 hours. ?CBC: ?Recent Labs  ?  07/28/20 ?0000  ?WBC 5.1  ?HGB 10.8*  ?HCT 32*  ?PLT 202  ? ?Lipid Panel: ?No results for input(s): CHOL, HDL, LDLCALC, TRIG, CHOLHDL, LDLDIRECT in the last 8760 hours. ?Lab Results  ?Component Value Date  ? HGBA1C 5.4 09/23/2017  ? ? ?Procedures since last visit: ?No results found. ? ?Assessment/Plan ?Visit for suture removal ?I cleaned the area with Alcohol swab ?I had to pull the Suture slowly and then Nurse was Able to cut it and we were  able to take it out ?Area Cleaned with Iodine and Strips placed. ?Her Laceration has healed well. ? ? ?Labs/tests ordered:  * No order type specified * ?Next appt:  07/24/2021 ? ?

## 2021-03-03 ENCOUNTER — Telehealth: Payer: Self-pay

## 2021-03-03 NOTE — Telephone Encounter (Signed)
-----   Message from Virgie Dad, MD sent at 03/03/2021  9:18 AM EST ----- ?Regarding: RE: Suture removal ?She can remove it ? ?----- Message ----- ?From: Charlyne Petrin, CMA ?Sent: 03/03/2021   9:16 AM EST ?To: Virgie Dad, MD ?Subject: Suture removal                                ? ?Can the patient remove the steri strips from her forehead or should she wait for them to come off? ? ? ?

## 2021-03-03 NOTE — Telephone Encounter (Signed)
Called and discussed with the patient. She hopes to have a scab before she removes them. Advised the patient it is up to her and maybe wait a few days if she would like.  ?

## 2021-03-13 DIAGNOSIS — Z9181 History of falling: Secondary | ICD-10-CM | POA: Diagnosis not present

## 2021-03-13 DIAGNOSIS — R293 Abnormal posture: Secondary | ICD-10-CM | POA: Diagnosis not present

## 2021-03-13 DIAGNOSIS — M4185 Other forms of scoliosis, thoracolumbar region: Secondary | ICD-10-CM | POA: Diagnosis not present

## 2021-03-13 DIAGNOSIS — M4126 Other idiopathic scoliosis, lumbar region: Secondary | ICD-10-CM | POA: Diagnosis not present

## 2021-03-13 DIAGNOSIS — M2569 Stiffness of other specified joint, not elsewhere classified: Secondary | ICD-10-CM | POA: Diagnosis not present

## 2021-03-13 DIAGNOSIS — R2689 Other abnormalities of gait and mobility: Secondary | ICD-10-CM | POA: Diagnosis not present

## 2021-03-13 DIAGNOSIS — M5459 Other low back pain: Secondary | ICD-10-CM | POA: Diagnosis not present

## 2021-03-13 DIAGNOSIS — M48061 Spinal stenosis, lumbar region without neurogenic claudication: Secondary | ICD-10-CM | POA: Diagnosis not present

## 2021-03-13 DIAGNOSIS — R278 Other lack of coordination: Secondary | ICD-10-CM | POA: Diagnosis not present

## 2021-03-14 DIAGNOSIS — H6123 Impacted cerumen, bilateral: Secondary | ICD-10-CM | POA: Diagnosis not present

## 2021-03-16 DIAGNOSIS — M5459 Other low back pain: Secondary | ICD-10-CM | POA: Diagnosis not present

## 2021-03-16 DIAGNOSIS — R293 Abnormal posture: Secondary | ICD-10-CM | POA: Diagnosis not present

## 2021-03-16 DIAGNOSIS — Z9181 History of falling: Secondary | ICD-10-CM | POA: Diagnosis not present

## 2021-03-16 DIAGNOSIS — M48061 Spinal stenosis, lumbar region without neurogenic claudication: Secondary | ICD-10-CM | POA: Diagnosis not present

## 2021-03-16 DIAGNOSIS — R278 Other lack of coordination: Secondary | ICD-10-CM | POA: Diagnosis not present

## 2021-03-16 DIAGNOSIS — R2689 Other abnormalities of gait and mobility: Secondary | ICD-10-CM | POA: Diagnosis not present

## 2021-03-16 DIAGNOSIS — M2569 Stiffness of other specified joint, not elsewhere classified: Secondary | ICD-10-CM | POA: Diagnosis not present

## 2021-03-16 DIAGNOSIS — M4185 Other forms of scoliosis, thoracolumbar region: Secondary | ICD-10-CM | POA: Diagnosis not present

## 2021-03-16 DIAGNOSIS — M4126 Other idiopathic scoliosis, lumbar region: Secondary | ICD-10-CM | POA: Diagnosis not present

## 2021-04-03 DIAGNOSIS — R293 Abnormal posture: Secondary | ICD-10-CM | POA: Diagnosis not present

## 2021-04-03 DIAGNOSIS — M48061 Spinal stenosis, lumbar region without neurogenic claudication: Secondary | ICD-10-CM | POA: Diagnosis not present

## 2021-04-03 DIAGNOSIS — Z9181 History of falling: Secondary | ICD-10-CM | POA: Diagnosis not present

## 2021-04-03 DIAGNOSIS — R2689 Other abnormalities of gait and mobility: Secondary | ICD-10-CM | POA: Diagnosis not present

## 2021-04-03 DIAGNOSIS — M5459 Other low back pain: Secondary | ICD-10-CM | POA: Diagnosis not present

## 2021-04-03 DIAGNOSIS — M2569 Stiffness of other specified joint, not elsewhere classified: Secondary | ICD-10-CM | POA: Diagnosis not present

## 2021-04-03 DIAGNOSIS — M4185 Other forms of scoliosis, thoracolumbar region: Secondary | ICD-10-CM | POA: Diagnosis not present

## 2021-04-03 DIAGNOSIS — R278 Other lack of coordination: Secondary | ICD-10-CM | POA: Diagnosis not present

## 2021-04-03 DIAGNOSIS — M4126 Other idiopathic scoliosis, lumbar region: Secondary | ICD-10-CM | POA: Diagnosis not present

## 2021-04-04 ENCOUNTER — Telehealth: Payer: Self-pay

## 2021-04-04 ENCOUNTER — Telehealth: Payer: Self-pay | Admitting: *Deleted

## 2021-04-04 MED ORDER — FLUTICASONE PROPIONATE 50 MCG/ACT NA SUSP: 2.0000 | Freq: Every day | NASAL | 3 refills | Status: DC

## 2021-04-04 NOTE — Telephone Encounter (Signed)
I have changed it to 2 sprays QD ?

## 2021-04-04 NOTE — Telephone Encounter (Signed)
Rx sent to pharmacy   

## 2021-04-04 NOTE — Telephone Encounter (Signed)
Called patient. No answer. LMOM with Dr. Steve Rattler response. LMOM to return call if further questions.  ?

## 2021-04-04 NOTE — Telephone Encounter (Signed)
Patient picked up the 7 pills of temazepam and wonders if this what you usually prescribe? Its expensive $40 for 7. Is there something in her health team formulary that is less expensive? ? ?Flurazepam is what she had been taking previously and it worked great for her with no side effects. She is skeptical and curious as to why you chose this one. Is it similar to flurazepam? Is it safe?  ? ?To Dr. Lyndel Safe ?

## 2021-04-04 NOTE — Telephone Encounter (Deleted)
She needs to reduce it to one spray QD ?

## 2021-04-04 NOTE — Addendum Note (Signed)
Addended by: Rafael Bihari A on: 04/04/2021 02:18 PM ? ? Modules accepted: Orders ? ?

## 2021-04-04 NOTE — Telephone Encounter (Addendum)
[facility] called and stated that patient is requesting a refill on her Flonase. Stated that patient is taking TWO Sprays in both nostrils daily NOT One.  ? ?Needing a Rx for updated instructions because is running out early.  ? ?Please Advise.  ?

## 2021-04-04 NOTE — Telephone Encounter (Signed)
I Don't know where that order has come from? I haven't placed that order ?

## 2021-04-11 DIAGNOSIS — N898 Other specified noninflammatory disorders of vagina: Secondary | ICD-10-CM | POA: Insufficient documentation

## 2021-04-20 ENCOUNTER — Telehealth: Payer: Self-pay | Admitting: Internal Medicine

## 2021-04-20 NOTE — Telephone Encounter (Addendum)
Patient stated that she thinks Christy prescribed Temazepam due to not being able to get the Flurazepam that she has taken for many years. Stated that she know Dr. Lyndel Safe does not like to prescribe sleeping aids.  ?

## 2021-04-20 NOTE — Telephone Encounter (Signed)
I called patient because Temazepam is NOT in patient's current Medication list and OV note dated 02/22/2021 states Temazepam was Discontinued.  ? ?Patient stated that she is taking Temazepam 7.'5mg'$  every other night to help sleep. Patient stated that she would like this Increased to '15mg'$  and requesting a 90 days supply.  ? ?Patient stated that it makes her feel better when she is able to sleep. Stated that when she takes it she usually get about 5 hours of rest.  ? ?Requesting a Rx to be sent to pharmacy.  ?Please Advise.  ? ?

## 2021-04-20 NOTE — Telephone Encounter (Signed)
Pt has 1 pill left & would like a refill on her  ?Topazen(sleeping pill). ? ?Has appt to see Alyse Low on Mon 04/24/21 ? ?Please advise  ?Beth Wise ?

## 2021-04-20 NOTE — Telephone Encounter (Signed)
I am wondering if she is getting confused. There is message by her to [facility] saying she had to pay $40 for 7 tabs of Temazepam. And now she needs refill again. I was not the one who called her any prescriptions. It can be that someone else does this prescription for her ? ?I would also  not increase her dose to 15 mg till she sees [place] on Mon as it can increase her confusion especially at her age. ?

## 2021-04-24 ENCOUNTER — Other Ambulatory Visit: Payer: Self-pay | Admitting: Adult Health

## 2021-04-24 ENCOUNTER — Encounter: Payer: PPO | Admitting: Adult Health

## 2021-04-24 DIAGNOSIS — L309 Dermatitis, unspecified: Secondary | ICD-10-CM | POA: Diagnosis not present

## 2021-04-24 DIAGNOSIS — F5101 Primary insomnia: Secondary | ICD-10-CM

## 2021-04-24 DIAGNOSIS — M810 Age-related osteoporosis without current pathological fracture: Secondary | ICD-10-CM | POA: Diagnosis not present

## 2021-04-24 DIAGNOSIS — L570 Actinic keratosis: Secondary | ICD-10-CM | POA: Diagnosis not present

## 2021-04-24 DIAGNOSIS — L905 Scar conditions and fibrosis of skin: Secondary | ICD-10-CM | POA: Diagnosis not present

## 2021-04-24 DIAGNOSIS — E210 Primary hyperparathyroidism: Secondary | ICD-10-CM | POA: Diagnosis not present

## 2021-04-24 NOTE — Telephone Encounter (Signed)
I do not see the medication on the list. I have not seen her in quite sometime. Does she need a refill? Or if she has a problem we can see her?

## 2021-04-24 NOTE — Telephone Encounter (Signed)
Patient called and stated that Christy prescribed the Temazepam and wants a message sent to her instead of Dr. Lyndel Safe. Stated that she hasn't taken the medication in 5 days.  ? ?Please Advise.  ?

## 2021-04-25 ENCOUNTER — Other Ambulatory Visit: Payer: Self-pay | Admitting: Orthopedic Surgery

## 2021-04-25 DIAGNOSIS — F5101 Primary insomnia: Secondary | ICD-10-CM

## 2021-04-25 NOTE — Telephone Encounter (Signed)
I have allowed 2 doses. Christy or Dr. Lyndel Safe need to address future refills.

## 2021-04-25 NOTE — Telephone Encounter (Signed)
Medication refill request received. Medication is not on medication list. ? ?Medication pended and sent to Windell Moulding, NP for approval/refusal ?

## 2021-04-25 NOTE — Telephone Encounter (Signed)
Ok I will fill the 7.5 mg dose for her.  ?

## 2021-04-25 NOTE — Telephone Encounter (Signed)
Patient was wanting a refill on the Temazepam but it is not showing in her current medication list.  ?

## 2021-05-02 DIAGNOSIS — N898 Other specified noninflammatory disorders of vagina: Secondary | ICD-10-CM | POA: Diagnosis not present

## 2021-05-02 DIAGNOSIS — B3731 Acute candidiasis of vulva and vagina: Secondary | ICD-10-CM | POA: Diagnosis not present

## 2021-05-02 DIAGNOSIS — B3732 Chronic candidiasis of vulva and vagina: Secondary | ICD-10-CM | POA: Diagnosis not present

## 2021-05-05 DIAGNOSIS — M353 Polymyalgia rheumatica: Secondary | ICD-10-CM | POA: Diagnosis not present

## 2021-05-05 DIAGNOSIS — E210 Primary hyperparathyroidism: Secondary | ICD-10-CM | POA: Diagnosis not present

## 2021-05-05 DIAGNOSIS — M810 Age-related osteoporosis without current pathological fracture: Secondary | ICD-10-CM | POA: Diagnosis not present

## 2021-05-05 DIAGNOSIS — G4700 Insomnia, unspecified: Secondary | ICD-10-CM | POA: Diagnosis not present

## 2021-05-05 DIAGNOSIS — E538 Deficiency of other specified B group vitamins: Secondary | ICD-10-CM | POA: Diagnosis not present

## 2021-05-05 DIAGNOSIS — K52831 Collagenous colitis: Secondary | ICD-10-CM | POA: Diagnosis not present

## 2021-05-05 DIAGNOSIS — H18519 Endothelial corneal dystrophy, unspecified eye: Secondary | ICD-10-CM | POA: Diagnosis not present

## 2021-05-05 DIAGNOSIS — G8929 Other chronic pain: Secondary | ICD-10-CM | POA: Diagnosis not present

## 2021-05-05 DIAGNOSIS — M79605 Pain in left leg: Secondary | ICD-10-CM | POA: Diagnosis not present

## 2021-05-05 DIAGNOSIS — D649 Anemia, unspecified: Secondary | ICD-10-CM | POA: Diagnosis not present

## 2021-05-05 DIAGNOSIS — D692 Other nonthrombocytopenic purpura: Secondary | ICD-10-CM | POA: Diagnosis not present

## 2021-05-05 DIAGNOSIS — R030 Elevated blood-pressure reading, without diagnosis of hypertension: Secondary | ICD-10-CM | POA: Diagnosis not present

## 2021-05-08 DIAGNOSIS — M545 Low back pain, unspecified: Secondary | ICD-10-CM | POA: Diagnosis not present

## 2021-05-08 DIAGNOSIS — M9905 Segmental and somatic dysfunction of pelvic region: Secondary | ICD-10-CM | POA: Diagnosis not present

## 2021-05-08 DIAGNOSIS — M542 Cervicalgia: Secondary | ICD-10-CM | POA: Diagnosis not present

## 2021-05-08 DIAGNOSIS — G8929 Other chronic pain: Secondary | ICD-10-CM | POA: Diagnosis not present

## 2021-05-08 DIAGNOSIS — M9902 Segmental and somatic dysfunction of thoracic region: Secondary | ICD-10-CM | POA: Diagnosis not present

## 2021-05-08 DIAGNOSIS — M9903 Segmental and somatic dysfunction of lumbar region: Secondary | ICD-10-CM | POA: Diagnosis not present

## 2021-05-08 DIAGNOSIS — M810 Age-related osteoporosis without current pathological fracture: Secondary | ICD-10-CM | POA: Diagnosis not present

## 2021-05-08 DIAGNOSIS — M25552 Pain in left hip: Secondary | ICD-10-CM | POA: Diagnosis not present

## 2021-05-08 DIAGNOSIS — M79652 Pain in left thigh: Secondary | ICD-10-CM | POA: Diagnosis not present

## 2021-05-08 DIAGNOSIS — M4135 Thoracogenic scoliosis, thoracolumbar region: Secondary | ICD-10-CM | POA: Diagnosis not present

## 2021-05-08 DIAGNOSIS — D649 Anemia, unspecified: Secondary | ICD-10-CM | POA: Diagnosis not present

## 2021-05-08 DIAGNOSIS — M9901 Segmental and somatic dysfunction of cervical region: Secondary | ICD-10-CM | POA: Diagnosis not present

## 2021-05-08 DIAGNOSIS — E538 Deficiency of other specified B group vitamins: Secondary | ICD-10-CM | POA: Diagnosis not present

## 2021-05-18 DIAGNOSIS — N9489 Other specified conditions associated with female genital organs and menstrual cycle: Secondary | ICD-10-CM | POA: Diagnosis not present

## 2021-05-18 DIAGNOSIS — N813 Complete uterovaginal prolapse: Secondary | ICD-10-CM | POA: Diagnosis not present

## 2021-05-18 DIAGNOSIS — L292 Pruritus vulvae: Secondary | ICD-10-CM | POA: Diagnosis not present

## 2021-05-24 DIAGNOSIS — E210 Primary hyperparathyroidism: Secondary | ICD-10-CM | POA: Diagnosis not present

## 2021-05-24 DIAGNOSIS — M810 Age-related osteoporosis without current pathological fracture: Secondary | ICD-10-CM | POA: Diagnosis not present

## 2021-05-24 DIAGNOSIS — M79652 Pain in left thigh: Secondary | ICD-10-CM | POA: Diagnosis not present

## 2021-05-25 DIAGNOSIS — M545 Low back pain, unspecified: Secondary | ICD-10-CM | POA: Diagnosis not present

## 2021-05-25 DIAGNOSIS — G8929 Other chronic pain: Secondary | ICD-10-CM | POA: Diagnosis not present

## 2021-05-25 DIAGNOSIS — R030 Elevated blood-pressure reading, without diagnosis of hypertension: Secondary | ICD-10-CM | POA: Diagnosis not present

## 2021-05-25 DIAGNOSIS — M9903 Segmental and somatic dysfunction of lumbar region: Secondary | ICD-10-CM | POA: Diagnosis not present

## 2021-05-25 DIAGNOSIS — M9902 Segmental and somatic dysfunction of thoracic region: Secondary | ICD-10-CM | POA: Diagnosis not present

## 2021-05-25 DIAGNOSIS — M542 Cervicalgia: Secondary | ICD-10-CM | POA: Diagnosis not present

## 2021-05-25 DIAGNOSIS — M810 Age-related osteoporosis without current pathological fracture: Secondary | ICD-10-CM | POA: Diagnosis not present

## 2021-05-25 DIAGNOSIS — M4135 Thoracogenic scoliosis, thoracolumbar region: Secondary | ICD-10-CM | POA: Diagnosis not present

## 2021-05-25 DIAGNOSIS — M9901 Segmental and somatic dysfunction of cervical region: Secondary | ICD-10-CM | POA: Diagnosis not present

## 2021-05-25 DIAGNOSIS — M79652 Pain in left thigh: Secondary | ICD-10-CM | POA: Diagnosis not present

## 2021-05-25 DIAGNOSIS — M9905 Segmental and somatic dysfunction of pelvic region: Secondary | ICD-10-CM | POA: Diagnosis not present

## 2021-05-25 DIAGNOSIS — G4700 Insomnia, unspecified: Secondary | ICD-10-CM | POA: Diagnosis not present

## 2021-05-30 DIAGNOSIS — L570 Actinic keratosis: Secondary | ICD-10-CM | POA: Diagnosis not present

## 2021-05-30 DIAGNOSIS — H61001 Unspecified perichondritis of right external ear: Secondary | ICD-10-CM | POA: Diagnosis not present

## 2021-05-30 DIAGNOSIS — Z85828 Personal history of other malignant neoplasm of skin: Secondary | ICD-10-CM | POA: Diagnosis not present

## 2021-06-13 DIAGNOSIS — D649 Anemia, unspecified: Secondary | ICD-10-CM | POA: Diagnosis not present

## 2021-06-13 DIAGNOSIS — M9901 Segmental and somatic dysfunction of cervical region: Secondary | ICD-10-CM | POA: Diagnosis not present

## 2021-06-13 DIAGNOSIS — M9902 Segmental and somatic dysfunction of thoracic region: Secondary | ICD-10-CM | POA: Diagnosis not present

## 2021-06-13 DIAGNOSIS — I10 Essential (primary) hypertension: Secondary | ICD-10-CM | POA: Diagnosis not present

## 2021-06-13 DIAGNOSIS — M79652 Pain in left thigh: Secondary | ICD-10-CM | POA: Diagnosis not present

## 2021-06-13 DIAGNOSIS — M9905 Segmental and somatic dysfunction of pelvic region: Secondary | ICD-10-CM | POA: Diagnosis not present

## 2021-06-13 DIAGNOSIS — M4135 Thoracogenic scoliosis, thoracolumbar region: Secondary | ICD-10-CM | POA: Diagnosis not present

## 2021-06-13 DIAGNOSIS — M542 Cervicalgia: Secondary | ICD-10-CM | POA: Diagnosis not present

## 2021-06-13 DIAGNOSIS — M9903 Segmental and somatic dysfunction of lumbar region: Secondary | ICD-10-CM | POA: Diagnosis not present

## 2021-06-13 DIAGNOSIS — M545 Low back pain, unspecified: Secondary | ICD-10-CM | POA: Diagnosis not present

## 2021-06-13 DIAGNOSIS — G8929 Other chronic pain: Secondary | ICD-10-CM | POA: Diagnosis not present

## 2021-06-19 DIAGNOSIS — H18513 Endothelial corneal dystrophy, bilateral: Secondary | ICD-10-CM | POA: Diagnosis not present

## 2021-06-19 DIAGNOSIS — H401131 Primary open-angle glaucoma, bilateral, mild stage: Secondary | ICD-10-CM | POA: Diagnosis not present

## 2021-06-19 DIAGNOSIS — Z961 Presence of intraocular lens: Secondary | ICD-10-CM | POA: Diagnosis not present

## 2021-06-23 DIAGNOSIS — D649 Anemia, unspecified: Secondary | ICD-10-CM | POA: Diagnosis not present

## 2021-06-23 DIAGNOSIS — I10 Essential (primary) hypertension: Secondary | ICD-10-CM | POA: Diagnosis not present

## 2021-06-26 DIAGNOSIS — L570 Actinic keratosis: Secondary | ICD-10-CM | POA: Diagnosis not present

## 2021-06-30 DIAGNOSIS — E210 Primary hyperparathyroidism: Secondary | ICD-10-CM | POA: Diagnosis not present

## 2021-06-30 DIAGNOSIS — M810 Age-related osteoporosis without current pathological fracture: Secondary | ICD-10-CM | POA: Diagnosis not present

## 2021-07-11 DIAGNOSIS — L570 Actinic keratosis: Secondary | ICD-10-CM | POA: Diagnosis not present

## 2021-07-11 DIAGNOSIS — L309 Dermatitis, unspecified: Secondary | ICD-10-CM | POA: Diagnosis not present

## 2021-07-11 DIAGNOSIS — L905 Scar conditions and fibrosis of skin: Secondary | ICD-10-CM | POA: Diagnosis not present

## 2021-07-18 DIAGNOSIS — M9902 Segmental and somatic dysfunction of thoracic region: Secondary | ICD-10-CM | POA: Diagnosis not present

## 2021-07-18 DIAGNOSIS — M9901 Segmental and somatic dysfunction of cervical region: Secondary | ICD-10-CM | POA: Diagnosis not present

## 2021-07-18 DIAGNOSIS — M62830 Muscle spasm of back: Secondary | ICD-10-CM | POA: Diagnosis not present

## 2021-07-18 DIAGNOSIS — M546 Pain in thoracic spine: Secondary | ICD-10-CM | POA: Diagnosis not present

## 2021-07-20 DIAGNOSIS — M9901 Segmental and somatic dysfunction of cervical region: Secondary | ICD-10-CM | POA: Diagnosis not present

## 2021-07-20 DIAGNOSIS — M9902 Segmental and somatic dysfunction of thoracic region: Secondary | ICD-10-CM | POA: Diagnosis not present

## 2021-07-20 DIAGNOSIS — M62830 Muscle spasm of back: Secondary | ICD-10-CM | POA: Diagnosis not present

## 2021-07-20 DIAGNOSIS — M546 Pain in thoracic spine: Secondary | ICD-10-CM | POA: Diagnosis not present

## 2021-07-24 ENCOUNTER — Encounter: Payer: PPO | Admitting: Adult Health

## 2021-08-01 DIAGNOSIS — N952 Postmenopausal atrophic vaginitis: Secondary | ICD-10-CM | POA: Diagnosis not present

## 2021-08-01 DIAGNOSIS — N898 Other specified noninflammatory disorders of vagina: Secondary | ICD-10-CM | POA: Diagnosis not present

## 2021-08-02 ENCOUNTER — Other Ambulatory Visit: Payer: Self-pay | Admitting: Internal Medicine

## 2021-08-24 DIAGNOSIS — M542 Cervicalgia: Secondary | ICD-10-CM | POA: Diagnosis not present

## 2021-08-24 DIAGNOSIS — N898 Other specified noninflammatory disorders of vagina: Secondary | ICD-10-CM | POA: Diagnosis not present

## 2021-08-24 DIAGNOSIS — H938X3 Other specified disorders of ear, bilateral: Secondary | ICD-10-CM | POA: Diagnosis not present

## 2021-08-24 DIAGNOSIS — M9905 Segmental and somatic dysfunction of pelvic region: Secondary | ICD-10-CM | POA: Diagnosis not present

## 2021-08-24 DIAGNOSIS — I10 Essential (primary) hypertension: Secondary | ICD-10-CM | POA: Diagnosis not present

## 2021-08-24 DIAGNOSIS — M9902 Segmental and somatic dysfunction of thoracic region: Secondary | ICD-10-CM | POA: Diagnosis not present

## 2021-08-24 DIAGNOSIS — M9903 Segmental and somatic dysfunction of lumbar region: Secondary | ICD-10-CM | POA: Diagnosis not present

## 2021-08-24 DIAGNOSIS — M545 Low back pain, unspecified: Secondary | ICD-10-CM | POA: Diagnosis not present

## 2021-08-24 DIAGNOSIS — M4135 Thoracogenic scoliosis, thoracolumbar region: Secondary | ICD-10-CM | POA: Diagnosis not present

## 2021-08-24 DIAGNOSIS — M9901 Segmental and somatic dysfunction of cervical region: Secondary | ICD-10-CM | POA: Diagnosis not present

## 2021-08-24 DIAGNOSIS — G8929 Other chronic pain: Secondary | ICD-10-CM | POA: Diagnosis not present

## 2021-08-28 DIAGNOSIS — M545 Low back pain, unspecified: Secondary | ICD-10-CM | POA: Diagnosis not present

## 2021-08-28 DIAGNOSIS — M1991 Primary osteoarthritis, unspecified site: Secondary | ICD-10-CM | POA: Diagnosis not present

## 2021-08-28 DIAGNOSIS — H903 Sensorineural hearing loss, bilateral: Secondary | ICD-10-CM | POA: Diagnosis not present

## 2021-08-28 DIAGNOSIS — Z6820 Body mass index (BMI) 20.0-20.9, adult: Secondary | ICD-10-CM | POA: Diagnosis not present

## 2021-08-28 DIAGNOSIS — Z1589 Genetic susceptibility to other disease: Secondary | ICD-10-CM | POA: Diagnosis not present

## 2021-08-28 DIAGNOSIS — K52831 Collagenous colitis: Secondary | ICD-10-CM | POA: Diagnosis not present

## 2021-08-28 DIAGNOSIS — H6123 Impacted cerumen, bilateral: Secondary | ICD-10-CM | POA: Diagnosis not present

## 2021-08-28 DIAGNOSIS — M48061 Spinal stenosis, lumbar region without neurogenic claudication: Secondary | ICD-10-CM | POA: Diagnosis not present

## 2021-08-28 DIAGNOSIS — M353 Polymyalgia rheumatica: Secondary | ICD-10-CM | POA: Diagnosis not present

## 2021-09-25 DIAGNOSIS — M9902 Segmental and somatic dysfunction of thoracic region: Secondary | ICD-10-CM | POA: Diagnosis not present

## 2021-09-25 DIAGNOSIS — M9905 Segmental and somatic dysfunction of pelvic region: Secondary | ICD-10-CM | POA: Diagnosis not present

## 2021-09-25 DIAGNOSIS — M9903 Segmental and somatic dysfunction of lumbar region: Secondary | ICD-10-CM | POA: Diagnosis not present

## 2021-09-25 DIAGNOSIS — I10 Essential (primary) hypertension: Secondary | ICD-10-CM | POA: Diagnosis not present

## 2021-09-25 DIAGNOSIS — M5459 Other low back pain: Secondary | ICD-10-CM | POA: Diagnosis not present

## 2021-09-25 DIAGNOSIS — E871 Hypo-osmolality and hyponatremia: Secondary | ICD-10-CM | POA: Diagnosis not present

## 2021-09-25 DIAGNOSIS — G8929 Other chronic pain: Secondary | ICD-10-CM | POA: Diagnosis not present

## 2021-09-28 DIAGNOSIS — Z1231 Encounter for screening mammogram for malignant neoplasm of breast: Secondary | ICD-10-CM | POA: Diagnosis not present

## 2021-10-03 ENCOUNTER — Other Ambulatory Visit (HOSPITAL_BASED_OUTPATIENT_CLINIC_OR_DEPARTMENT_OTHER): Payer: Self-pay

## 2021-10-03 MED ORDER — FLUAD QUADRIVALENT 0.5 ML IM PRSY
PREFILLED_SYRINGE | INTRAMUSCULAR | 0 refills | Status: DC
  Filled 2021-10-03: qty 0.5, 1d supply, fill #0

## 2021-10-05 DIAGNOSIS — I10 Essential (primary) hypertension: Secondary | ICD-10-CM | POA: Diagnosis not present

## 2021-10-05 DIAGNOSIS — R413 Other amnesia: Secondary | ICD-10-CM | POA: Diagnosis not present

## 2021-10-05 DIAGNOSIS — G4700 Insomnia, unspecified: Secondary | ICD-10-CM | POA: Diagnosis not present

## 2021-10-05 DIAGNOSIS — R0981 Nasal congestion: Secondary | ICD-10-CM | POA: Diagnosis not present

## 2021-10-27 DIAGNOSIS — G8929 Other chronic pain: Secondary | ICD-10-CM | POA: Diagnosis not present

## 2021-10-27 DIAGNOSIS — M545 Low back pain, unspecified: Secondary | ICD-10-CM | POA: Diagnosis not present

## 2021-11-08 ENCOUNTER — Other Ambulatory Visit: Payer: Self-pay | Admitting: Internal Medicine

## 2021-11-20 DIAGNOSIS — L309 Dermatitis, unspecified: Secondary | ICD-10-CM | POA: Diagnosis not present

## 2021-12-19 DIAGNOSIS — Z961 Presence of intraocular lens: Secondary | ICD-10-CM | POA: Diagnosis not present

## 2021-12-19 DIAGNOSIS — H52203 Unspecified astigmatism, bilateral: Secondary | ICD-10-CM | POA: Diagnosis not present

## 2021-12-19 DIAGNOSIS — H18513 Endothelial corneal dystrophy, bilateral: Secondary | ICD-10-CM | POA: Diagnosis not present

## 2021-12-19 DIAGNOSIS — H401131 Primary open-angle glaucoma, bilateral, mild stage: Secondary | ICD-10-CM | POA: Diagnosis not present

## 2021-12-21 DIAGNOSIS — N815 Vaginal enterocele: Secondary | ICD-10-CM | POA: Diagnosis not present

## 2021-12-21 DIAGNOSIS — N952 Postmenopausal atrophic vaginitis: Secondary | ICD-10-CM | POA: Diagnosis not present

## 2022-01-23 ENCOUNTER — Other Ambulatory Visit: Payer: Self-pay | Admitting: Adult Health

## 2022-01-23 DIAGNOSIS — E538 Deficiency of other specified B group vitamins: Secondary | ICD-10-CM

## 2022-02-07 ENCOUNTER — Telehealth: Payer: PPO

## 2022-02-07 DIAGNOSIS — E538 Deficiency of other specified B group vitamins: Secondary | ICD-10-CM

## 2022-02-07 MED ORDER — CYANOCOBALAMIN 1000 MCG/ML IJ SOLN: INTRAMUSCULAR | 1 refills | Status: DC

## 2022-02-07 NOTE — Telephone Encounter (Signed)
Message received via fax from [name] indicating patient would like a 47 day rx to replace rx sent for 30 day supply

## 2022-02-13 ENCOUNTER — Other Ambulatory Visit: Payer: Self-pay | Admitting: Internal Medicine

## 2022-02-13 DIAGNOSIS — J301 Allergic rhinitis due to pollen: Secondary | ICD-10-CM

## 2022-02-15 ENCOUNTER — Encounter: Payer: Self-pay | Admitting: Podiatry

## 2022-02-15 ENCOUNTER — Ambulatory Visit (INDEPENDENT_AMBULATORY_CARE_PROVIDER_SITE_OTHER): Payer: HMO | Admitting: Podiatry

## 2022-02-15 DIAGNOSIS — M1991 Primary osteoarthritis, unspecified site: Secondary | ICD-10-CM | POA: Insufficient documentation

## 2022-02-15 DIAGNOSIS — M2011 Hallux valgus (acquired), right foot: Secondary | ICD-10-CM | POA: Diagnosis not present

## 2022-02-15 DIAGNOSIS — M2012 Hallux valgus (acquired), left foot: Secondary | ICD-10-CM | POA: Diagnosis not present

## 2022-02-15 NOTE — Progress Notes (Signed)
Subjective:  Patient ID: Beth Wise, female    DOB: 10-14-1925,  MRN: [id number] HPI Chief Complaint  Patient presents with   Foot Pain    1st MPJ bilateral - bunion deformity x years, feels like she needs a toe spacer to keep the toes apart, which she is already using, just needs a little advice on what to do, tries to wear wide shoes   New Patient (Initial Visit)    89 y.o. female presents with the above complaint.   ROS: Denies fever chills nausea vomit muscle aches pains calf pain back pain chest pain shortness of breath.  Past Medical History:  Diagnosis Date   Allergy    Arthritis    Back pain    Blepharitis    Cancer (HCC)    basal cell on face   Candidal esophagitis ([place]) 2010   Cataract    Chest pain, atypical    Collagenous colitis    Dr. Maurene Capes   Diverticulosis    Vira Agar' corneal dystrophy    GERD (gastroesophageal reflux disease)    Heart murmur    Hiatal hernia 2010   History of fibromyalgia    Hypercalcemia    Hyperlipidemia    Hypertension    IBS (irritable bowel syndrome)    Insomnia    Osteoporosis    Primary hyperparathyroidism ([place]) 03/11/2017   Rectocele    Scoliosis    Shingles 1975   at waistline, no chronic pain.     Spinal stenosis    Vaginal prolapse    Past Surgical History:  Procedure Laterality Date   ABDOMINAL HYSTERECTOMY     BREAST BIOPSY  1964   left benign   CARDIOVASCULAR STRESS TEST  12/03/2007   EF 72%   CATARACT EXTRACTION W/ INTRAOCULAR LENS  IMPLANT, BILATERAL     COLONOSCOPY  04/24/2006   diverticulosis   FLEXIBLE SIGMOIDOSCOPY  07/27/2010   diverticulosis, collagenous colitis   TONSILLECTOMY AND ADENOIDECTOMY     UPPER GASTROINTESTINAL ENDOSCOPY  01/29/2008   esophagitis, hiatal hernia, gastritis   US ECHOCARDIOGRAPHY  10/30/2007    EF 55-60%    Current Outpatient Medications:    amLODipine (NORVASC) 5 MG tablet, Take by mouth., Disp: , Rfl:    cyclobenzaprine (FLEXERIL) 5 MG tablet, TAKE ONE TABLET BY MOUTH  TWICE DAILY AS NEEDED FOR MUSCLE pain, Disp: , Rfl:    acetaminophen (TYLENOL) 500 MG tablet, Take 500 mg by mouth 4 (four) times daily as needed., Disp: , Rfl:    BIOTIN PO, Take 1,500 mg by mouth daily., Disp: , Rfl:    BUDESONIDE PO, Take by mouth., Disp: , Rfl:    Cannabidiol POWD, by Does not apply route. This is a liquid roll on, Disp: , Rfl:    COVID-19 mRNA vaccine, Moderna, 100 MCG/0.5ML injection, Inject into the muscle., Disp: 0.25 mL, Rfl: 0   cyanocobalamin (VITAMIN B12) 1000 MCG/ML injection, INJECT 1ML INTRAMUSCULARLY ONCE EVERY 30 DAYS, Disp: 10 mL, Rfl: 1   estradiol (ESTRACE) 0.1 MG/GM vaginal cream, Place 1 Applicatorful vaginally at bedtime., Disp: , Rfl:    fluticasone (FLONASE) 50 MCG/ACT nasal spray, Spray 2 sprays in each nostril daily, Disp: 16 g, Rfl: 4   GuaiFENesin (MUCINEX PO), Take 1 tablet by mouth every 12 (twelve) hours as needed (congestion). , Disp: , Rfl:    hydrocortisone 2.5 % cream, Apply topically 2 (two) times daily., Disp: 30 g, Rfl: 1   influenza vaccine adjuvanted (FLUAD QUADRIVALENT) 0.5 ML injection, Inject into the  muscle., Disp: 0.5 mL, Rfl: 0   ipratropium (ATROVENT) 0.03 % nasal spray, Place 2 sprays into both nostrils 2 (two) times daily as needed for rhinitis. As directed nasal spray, Disp: 90 mL, Rfl: 1   Melatonin 5 MG TABS, Take 1 tablet by mouth at bedtime as needed (Takes an additional as needed when wakes up). , Disp: , Rfl:    Polyethyl Glycol-Propyl Glycol (SYSTANE) 0.4-0.3 % GEL ophthalmic gel, Place 1 application into both eyes daily., Disp: , Rfl:    PROLIA 60 MG/ML SOSY injection, Inject into the skin., Disp: , Rfl:    saccharomyces boulardii (FLORASTOR) 250 MG capsule, Take 250 mg by mouth daily., Disp: , Rfl:    temazepam (RESTORIL) 7.5 MG capsule, TAKE ONE CAPSULE BY MOUTH EVERY NIGHT AT BEDTIME AS NEEDED FOR SLEEP, Disp: 2 capsule, Rfl: 0   tetrahydrozoline 0.05 % ophthalmic solution, Place 1 drop into both eyes daily., Disp: ,  Rfl:    timolol (BETIMOL) 0.5 % ophthalmic solution, Place 1 drop into both eyes daily., Disp: 10 mL, Rfl: 3   triamcinolone cream (KENALOG) 0.1 %, Apply 1 application topically. 3 times a week, Disp: , Rfl:   Allergies  Allergen Reactions   Actonel [Risedronate Sodium] Other (See Comments)    Joint pain   Amoxicillin Diarrhea   Celecoxib     Other Reaction(s): joint pain, Myalgias (intolerance)   Cinacalcet     Other Reaction(s): Not available   Cinacalcet Hcl Diarrhea   Erythromycin Other (See Comments)    Gi upset   Lipitor [Atorvastatin Calcium]    Monistat [Miconazole]    Zetia [Ezetimibe]     Joint pain   Review of Systems Objective:  There were no vitals filed for this visit.  General: Well developed, nourished, in no acute distress, alert and oriented x3   Dermatological: Skin is warm, dry and supple bilateral. Nails x 10 are well maintained; remaining integument appears unremarkable at this time. There are no open sores, no preulcerative lesions, no rash or signs of infection present.  Vascular: Dorsalis Pedis artery and Posterior Tibial artery pedal pulses are 2/4 bilateral with immedate capillary fill time. Pedal hair growth present. No varicosities and no lower extremity edema present bilateral.   Neruologic: Grossly intact via light touch bilateral. Vibratory intact via tuning fork bilateral. Protective threshold with Semmes Wienstein monofilament intact to all pedal sites bilateral. Patellar and Achilles deep tendon reflexes 2+ bilateral. No Babinski or clonus noted bilateral.   Musculoskeletal: No gross boney pedal deformities bilateral. No pain, crepitus, or limitation noted with foot and ankle range of motion bilateral. Muscular strength 5/5 in all groups tested bilateral.  Hallux valgus bilateral.  Great range of motion no crepitation.  Gait: Unassisted, Nonantalgic.    Radiographs:  None taken  Assessment & Plan:   Assessment: Hallux valgus deformity  with crowding of the toes bilateral foot left greater than right  Plan: Demonstrated multiple types of toe spacers today and provided her with samples follow-up with [place] as needed.     Raed Schalk T. [place], [place]

## 2022-08-28 ENCOUNTER — Ambulatory Visit: Payer: PPO | Admitting: Gastroenterology

## 2022-08-29 ENCOUNTER — Ambulatory Visit: Payer: PPO | Admitting: Gastroenterology

## 2022-10-24 ENCOUNTER — Other Ambulatory Visit (HOSPITAL_BASED_OUTPATIENT_CLINIC_OR_DEPARTMENT_OTHER): Payer: Self-pay

## 2022-10-24 MED ORDER — INFLUENZA VAC A&B SURF ANT ADJ 0.5 ML IM SUSY
0.5000 mL | PREFILLED_SYRINGE | Freq: Once | INTRAMUSCULAR | 0 refills | Status: AC
  Filled 2022-10-24: qty 0.5, 1d supply, fill #0

## 2022-10-30 ENCOUNTER — Ambulatory Visit: Payer: PPO | Admitting: Gastroenterology

## 2023-02-19 ENCOUNTER — Other Ambulatory Visit: Payer: Self-pay | Admitting: Adult Health

## 2023-02-19 DIAGNOSIS — E538 Deficiency of other specified B group vitamins: Secondary | ICD-10-CM

## 2023-05-15 ENCOUNTER — Ambulatory Visit (INDEPENDENT_AMBULATORY_CARE_PROVIDER_SITE_OTHER): Payer: Self-pay | Admitting: Otolaryngology

## 2023-07-03 ENCOUNTER — Ambulatory Visit (INDEPENDENT_AMBULATORY_CARE_PROVIDER_SITE_OTHER): Payer: Self-pay | Admitting: Otolaryngology

## 2023-07-29 ENCOUNTER — Encounter: Payer: Self-pay | Admitting: Family Medicine

## 2023-07-29 ENCOUNTER — Ambulatory Visit (INDEPENDENT_AMBULATORY_CARE_PROVIDER_SITE_OTHER): Payer: Self-pay | Admitting: Family Medicine

## 2023-07-29 VITALS — BP 131/75 | HR 58 | Temp 97.9°F | Resp 16 | Ht 62.0 in | Wt 112.1 lb

## 2023-07-29 DIAGNOSIS — I10 Essential (primary) hypertension: Secondary | ICD-10-CM

## 2023-07-29 DIAGNOSIS — M810 Age-related osteoporosis without current pathological fracture: Secondary | ICD-10-CM

## 2023-07-29 DIAGNOSIS — D638 Anemia in other chronic diseases classified elsewhere: Secondary | ICD-10-CM | POA: Diagnosis not present

## 2023-07-29 DIAGNOSIS — K52831 Collagenous colitis: Secondary | ICD-10-CM

## 2023-07-29 DIAGNOSIS — E538 Deficiency of other specified B group vitamins: Secondary | ICD-10-CM | POA: Diagnosis not present

## 2023-07-29 DIAGNOSIS — M48061 Spinal stenosis, lumbar region without neurogenic claudication: Secondary | ICD-10-CM

## 2023-07-29 MED ORDER — DENOSUMAB 60 MG/ML ~~LOC~~ SOSY
60.0000 mg | PREFILLED_SYRINGE | SUBCUTANEOUS | Status: AC
Start: 2023-07-29 — End: ?

## 2023-07-29 MED ORDER — LIDOCAINE 5 % EX PTCH: 1.0000 | MEDICATED_PATCH | CUTANEOUS | 12 refills | Status: DC

## 2023-07-29 MED ORDER — AZELASTINE HCL 0.1 % NA SOLN
1.0000 | Freq: Two times a day (BID) | NASAL | 4 refills | Status: DC
Start: 2023-07-29 — End: 2023-10-09

## 2023-07-29 MED ORDER — IBUPROFEN 600 MG PO TABS: 600.0000 mg | ORAL_TABLET | Freq: Three times a day (TID) | ORAL | 1 refills | Status: DC | PRN

## 2023-07-29 NOTE — Patient Instructions (Signed)
 Welcome to [facility]! It was a pleasure meeting you today.  As discussed, Please schedule a 6 month follow up visit today.  Call [facility] for bone density.   We are going to work on the prolia -call in 1 wk if not heard  Check blood pressure monthly  Check on when B12 shot was compared to when labs were done.  PLEASE NOTE:  If you had any LAB tests please let us  know if you have not heard back within a few days. You may see your results on MyChart before we have a chance to review them but we will give you a call once they are reviewed by us . If we ordered any REFERRALS today, please let us  know if you have not heard from their office within the next week.  Let us  know through MyChart if you are needing REFILLS, or have your pharmacy send us  the request. You can also use MyChart to communicate with me or any office staff.  Please try these tips to maintain a healthy lifestyle:  Eat most of your calories during the day when you are active. Eliminate processed foods including packaged sweets (pies, cakes, cookies), reduce intake of potatoes, white bread, white pasta, and white rice. Look for whole grain options, oat flour or almond flour.  Each meal should contain half fruits/vegetables, one quarter protein, and one quarter carbs (no bigger than a computer mouse).  Cut down on sweet beverages. This includes juice, soda, and sweet tea. Also watch fruit intake, though this is a healthier sweet option, it still contains natural sugar! Limit to 3 servings daily.  Drink at least 1 glass of water with each meal and aim for at least 8 glasses per day  Exercise at least 150 minutes every week.

## 2023-07-29 NOTE — Progress Notes (Signed)
 New Patient Office Visit  Subjective:  Patient ID: Beth Wise, female    DOB: 1925-07-29  Age: 89 y.o. MRN: [id number]  CC:  Chief Complaint  Patient presents with   Establish Care    Initial visit to establish care with new pcp     HPI Beth Wise presents for new pt-[facility] Discussed the use of AI scribe software for clinical note transcription with the patient, who gave verbal consent to proceed.  History of Present Illness Beth Wise is a 89 year old female with osteoporosis who presents for a follow-up on her Prolia  treatment and other health concerns.  She is experiencing issues with her Prolia  treatment for osteoporosis. Her Prolia  injection was due on May 26, 2023, but she encountered difficulties with her previous doctor regarding the prescription and insurance coverage. She spent two days trying to find an affordable option and eventually arranged to get the injection at her doctor's office, but the medication was not available when she arrived. She is concerned about missing her Prolia  dose as it previously led to high calcium levels and a visit to an endocrinologist.  She has a history of fibromyalgia, collagenous colitis, Fuchs' corneal dystrophy, high cholesterol, high blood pressure, irritable bowel syndrome, and scoliosis. She takes amlodipine 5 mg daily for blood pressure, which has been stable for about a year. She also takes probiotics for gut health.  She has experienced significant weight loss in the past, dropping to 103 pounds, but has since gained back to 112 pounds. She is cautious about gaining more weight to avoid buying new clothes.  Her sleep is disrupted, waking multiple times at night, especially when not taking temazepam . She uses temazepam  sparingly, about two to three times a month, due to concerns about its effects on her cognition.  She experiences occasional nausea, which she suspects might be related to low sodium levels noted in past  blood work. She takes B12 supplements daily but prefers monthly injections, which she previously received. Her sodium was noted to be high in April, and she has a history of low hemoglobin. Switched to PO B12 when levels were high  She uses lidocaine  patches and roll-on for back and hip pain, which she attributes to scoliosis and possibly sciatica. She applies the patches to specific areas.  She has a history of nasal congestion and uses ipratropium and fluticasone  nasal sprays. She feels these may not be as effective as before and is considering alternatives.     Current Outpatient Medications:    acetaminophen (TYLENOL) 500 MG tablet, Take 500 mg by mouth 4 (four) times daily as needed., Disp: , Rfl:    amLODipine (NORVASC) 5 MG tablet, Take by mouth., Disp: , Rfl:    azelastine  (ASTELIN ) 0.1 % nasal spray, Place 1 spray into both nostrils 2 (two) times daily. Use in each nostril as directed, Disp: 30 mL, Rfl: 4   Biotin (BIOTIN MAXIMUM STRENGTH) 10 MG TABS, Take 1 tablet by mouth daily., Disp: , Rfl:    Cholecalciferol (D3 PO), Take by mouth., Disp: , Rfl:    cyanocobalamin  (VITAMIN B12) 1000 MCG/ML injection, INJECT 1ML INTRAMUSCULARLY ONCE EVERY 30 DAYS, Disp: 10 mL, Rfl: 1   fluticasone  (FLONASE ) 50 MCG/ACT nasal spray, Spray 2 sprays in each nostril daily, Disp: 16 g, Rfl: 4   GuaiFENesin (MUCINEX PO), Take 1 tablet by mouth every 12 (twelve) hours as needed (congestion). , Disp: , Rfl:    hydrocortisone  2.5 % cream, Apply topically  2 (two) times daily., Disp: 30 g, Rfl: 1   ibuprofen  (ADVIL ) 600 MG tablet, Take 1 tablet (600 mg total) by mouth every 8 (eight) hours as needed., Disp: 270 tablet, Rfl: 1   ipratropium (ATROVENT ) 0.03 % nasal spray, Place 2 sprays into both nostrils 2 (two) times daily as needed for rhinitis. As directed nasal spray, Disp: 90 mL, Rfl: 1   latanoprost (XALATAN) 0.005 % ophthalmic solution, 1 drop at bedtime., Disp: , Rfl:    lidocaine  (LIDODERM ) 5 %, Place 1  patch onto the skin daily. Remove & Discard patch within 12 hours., Disp: 30 patch, Rfl: 12   loratadine (CLARITIN) 10 MG tablet, Take 10 mg by mouth., Disp: , Rfl:    Multiple Vitamin (MULTIVITAMIN ADULT PO), , Disp: , Rfl:    Probiotic Product (PROBIOTIC PO), Take by mouth., Disp: , Rfl:    PROLIA  60 MG/ML SOSY injection, Inject into the skin., Disp: , Rfl:    saccharomyces boulardii (FLORASTOR) 250 MG capsule, Take 250 mg by mouth daily., Disp: , Rfl:    temazepam  (RESTORIL ) 15 MG capsule, TAKE ONE CAPSULE BY MOUTH ONCE NIGHTLY AS NEEDED FOR SLEEP, Disp: , Rfl:   Current Facility-Administered Medications:    denosumab  (PROLIA ) injection 60 mg, 60 mg, Subcutaneous, Q6 months, [name], MD  Past Medical History:  Diagnosis Date   Allergy    Arthritis    Back pain    Blepharitis    Cancer (HCC)    basal cell on face   Candidal esophagitis (HCC) 2010   Cataract    Chest pain, atypical    Collagenous colitis    Dr. Karyle   Diverticulosis    Clarence' corneal dystrophy    GERD (gastroesophageal reflux disease)    Heart murmur    Hiatal hernia 2010   History of fibromyalgia    Hypercalcemia    Hyperlipidemia    Hypertension    IBS (irritable bowel syndrome)    Insomnia    Osteoporosis    Primary hyperparathyroidism (HCC) 03/11/2017   Rectocele    Scoliosis    Shingles 1975   at waistline, no chronic pain.     Spinal stenosis    Vaginal prolapse     Past Surgical History:  Procedure Laterality Date   ABDOMINAL HYSTERECTOMY     BREAST BIOPSY  1964   left benign   CARDIOVASCULAR STRESS TEST  12/03/2007   EF 72%   CATARACT EXTRACTION W/ INTRAOCULAR LENS  IMPLANT, BILATERAL     COLONOSCOPY  04/24/2006   diverticulosis   FLEXIBLE SIGMOIDOSCOPY  07/27/2010   diverticulosis, collagenous colitis   TONSILLECTOMY AND ADENOIDECTOMY     UPPER GASTROINTESTINAL ENDOSCOPY  01/29/2008   esophagitis, hiatal hernia, gastritis   US  ECHOCARDIOGRAPHY  10/30/2007    EF 55-60%     Family History  Problem Relation Age of Onset   Breast cancer Mother        died at 44   Stroke Mother    Heart disease Father        died at 44   Cirrhosis Sister        died at 50   Pneumonia Sister        died at 63 months   Heart disease Sister        died at 73   Cancer - Other Sister        stomach; died at 72   Stomach cancer Sister    Heart disease Brother  died at 67   Heart disease Brother        c-diff and sepsis; died at 44   Prostate cancer Son    Colon cancer Neg Hx    Diabetes Neg Hx    Rectal cancer Neg Hx    Esophageal cancer Neg Hx     Social History   Socioeconomic History   Marital status: Widowed    Spouse name: Not on file   Number of children: 2   Years of education: Not on file   Highest education level: Not on file  Occupational History   Occupation: retired    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 Regular (shouldn't have excessive amts of acid, hot &spicey foods)      Do you drink/eat things with caffeine? yes      Marital status?       widow                             What year were you married? 57 - 1988      Do you live in a house, apartment, assisted living, condo, trailer, etc.? apartment      Is it one or more stories? 1      How many persons live in your home? 1 (self)      Do you have any pets in your home? (please list) no no !!      Highest level of education completed?      Current or past profession: hairdresser      Do you exercise?      little                                Type & how often? Leg lifts and plank, stretching every day.      Advanced Directives      Do you have a living will?  yes      Do you have  a DNR form?         yes                         If not, do you want to discuss one? yes      Do you have signed POA/HPOA for forms? yes      Functional Status      Do you have difficulty bathing or dressing yourself?      Do you have difficulty preparing food or eating?       Do you have difficulty managing your medications?      Do you have difficulty managing your finances?      Do you have difficulty affording your medications?      2 grands 1 great   Social Drivers of Corporate investment banker Strain: Low Risk  (11/27/2016)   Overall Financial Resource Strain (CARDIA)    Difficulty of Paying Living Expenses: Not hard at all  Food Insecurity: Low Risk  (04/09/2023)   Received from [facility]   Hunger Vital Sign    Within the past 12 months, you worried that your food would run out before you got money to buy more: Never true    Within the past 12 months, the food you bought just didn't last and you didn't have money to get more. : Never true  Transportation Needs: No Transportation Needs (04/09/2023)   Received from [facility]   Transportation    In the past 12 months, has lack of reliable transportation kept you from medical appointments, meetings, work or from getting things needed for daily living? : No  Physical Activity: Insufficiently Active (11/27/2016)   Exercise Vital Sign    Days of Exercise per Week: 7 days    Minutes of Exercise per Session: 10 min  Stress: No Stress Concern Present (11/27/2016)   [employer] of Occupational Health - Occupational Stress Questionnaire    Feeling of Stress : Only a little  Social Connections: Moderately Integrated (11/27/2016)   Social Connection and Isolation Panel    Frequency of Communication with Friends and Family: More than three times a week    Frequency of Social Gatherings with Friends and Family: More than three times a week    Attends Religious Services: 1 to 4 times per year    Active Member of [employer] or  Organizations: Yes    Attends Banker Meetings: Never    Marital Status: Widowed  Intimate Partner Violence: Not At Risk (11/27/2016)   Humiliation, Afraid, Rape, and Kick questionnaire    Fear of Current or Ex-Partner: No    Emotionally Abused: No    Physically Abused: No    Sexually Abused: No    ROS  ROS: Gen: no fever, chills  Skin: no rash, itching ENT: no ear pain, ear drainage, nasal congestion, rhinorrhea, sinus pressure, sore throat Eyes: no blurry vision, double vision Resp: no cough, wheeze,SOB CV: no CP, palpitations, LE edema,  GI: no heartburn, n/v/d/c, abd pain GU: no dysuria, urgency, frequency, hematuria MSK: no joint pain, myalgias, back pain Neuro: no dizziness, headache, weakness, vertigo Psych: no depression, anxiety, insomnia, SI   Objective:   Today's Vitals: BP 131/75   Pulse (!) 58   Temp 97.9 F (36.6 C) (Temporal)   Resp 16   Ht 5' 2 (1.575 m)   Wt 112 lb 2 oz (50.9 kg)   SpO2 99%   BMI 20.51 kg/m   Physical Exam  Gen: WDWN NAD HEENT: NCAT, conjunctiva not injected, sclera nonicteric NECK:  supple, no thyromegaly, no nodes, no carotid bruits CARDIAC: RRR, S1S2+, no murmur. DP 2+B LUNGS: CTAB. No wheezes ABDOMEN:  BS+, soft, NTND, No HSM, no masses EXT:  no edema MSK: no gross abnormalities. walker NEURO: A&O x3.  CN II-XII intact.  PSYCH: normal mood. Good eye contact   Reviewed some notes.  w/pt  Assessment & Plan:  Senile osteoporosis -     DG DXA BODY COMPOSITION; Future -     Denosumab  -     Comprehensive metabolic panel with GFR -     TSH  Benign essential hypertension -     CBC with Differential/Platelet -     Comprehensive metabolic panel with GFR -     TSH  Anemia of chronic disease  Collagenous colitis  Vitamin B12 deficiency -     CBC with Differential/Platelet -     IBC + Ferritin -     Vitamin B12  Spinal stenosis of lumbar region  without neurogenic claudication -     Ibuprofen ; Take 1  tablet (600 mg total) by mouth every 8 (eight) hours as needed.  Dispense: 270 tablet; Refill: 1 -     Lidocaine ; Place 1 patch onto the skin daily. Remove & Discard patch within 12 hours.  Dispense: 30 patch; Refill: 12  Other orders -     Azelastine  HCl; Place 1 spray into both nostrils 2 (two) times daily. Use in each nostril as directed  Dispense: 30 mL; Refill: 4  Assessment and Plan Assessment & Plan Osteoporosis   Osteoporosis management with Prolia  is overdue since May 2025 due to insurance and communication issues. She is concerned about hypercalcemia if Prolia  is not administered, as experienced in 2023. A bone density test is due, with the last one conducted in September 2021. She prefers Prolia  administration through the office for cost and insurance reasons. Order a bone density test and coordinate Prolia  injection authorization and administration. Instruct her to contact [facility] for the bone density test and advise her to call the office in one week if there is no update on Prolia .  Nausea   Intermittent nausea may be related to hyponatremia or GERD. Sodium levels were previously low, and nausea occurs primarily in the morning and postprandially. GERD is a potential cause, though she is not on medication for it. Ginger chews provide symptomatic relief. Order blood work to check sodium levels and consider GERD as a potential cause of nausea.  Hypertension   Hypertension is managed with amlodipine 5 mg daily, and blood pressure is well-controlled. She lacks a personal blood pressure cuff and has not been monitoring regularly. She is advised to monitor blood pressure monthly at [facility] and report significant changes.  Chronic Pain   Chronic pain is managed with ibuprofen  600 mg TID and lidocaine  patches. Pain in the lower back, hips, and legs is possibly related to scoliosis, not sciatica. She uses lidocaine  patches and roll-on for relief, preferring 5% patches for effectiveness.  Prescribe lidocaine  patches from [facility] and ibuprofen  from [facility].  B12 Deficiency   B12 deficiency is managed with monthly injections, now switched to oral supplements. She prefers injections for convenience. Previous B12 levels were high, possibly due to recent injections before testing. Blood work will assess current B12 levels and determine the need for injections versus oral supplements.  Sleep Disturbance   Sleep disturbance with frequent nocturnal awakenings is managed with temazepam , used sparingly 2-3 times a month due to concerns about side effects and dependency. She is cautious about regular use due to potential cognitive effects and fall risk. Alternatives were discussed, but she prefers current use. Continue current temazepam  use as needed and discuss alternative sleep aids if necessary.  Nasal Congestion   Chronic nasal congestion is managed with ipratropium and fluticasone  nasal sprays. She feels these may be less effective over time and is open to alternatives. Azelastine  nasal spray was suggested as an alternative. Prescribe azelastine  nasal spray from [facility].  General Health Maintenance   She has not had a mammogram in 2-3 years and is considering discontinuation based on guidelines. Regular monitoring of blood pressure and bone density is part of ongoing health maintenance. She is advised to continue regular health screenings as appropriate. Discontinue routine mammograms based on guidelines and encourage regular health screenings as appropriate.  Follow-up   Regular follow-up is planned to monitor ongoing health issues and medication management. She prefers appointments every six months unless issues arise. Schedule a  follow-up appointment in six months and advise her to contact the office if any issues arise before the next scheduled visit.    Follow-up: Return in about 6 months (around 01/29/2024) for chronic follow-up.   Jenkins CHRISTELLA Carrel, MD

## 2023-07-30 ENCOUNTER — Telehealth: Payer: Self-pay | Admitting: *Deleted

## 2023-07-30 ENCOUNTER — Telehealth: Payer: Self-pay

## 2023-07-30 ENCOUNTER — Other Ambulatory Visit (HOSPITAL_COMMUNITY): Payer: Self-pay

## 2023-07-30 ENCOUNTER — Ambulatory Visit: Payer: Self-pay | Admitting: Family Medicine

## 2023-07-30 DIAGNOSIS — D649 Anemia, unspecified: Secondary | ICD-10-CM

## 2023-07-30 DIAGNOSIS — E871 Hypo-osmolality and hyponatremia: Secondary | ICD-10-CM

## 2023-07-30 LAB — COMPREHENSIVE METABOLIC PANEL WITH GFR
ALT: 12 U/L (ref 0–35)
AST: 22 U/L (ref 0–37)
Albumin: 4.3 g/dL (ref 3.5–5.2)
Alkaline Phosphatase: 33 U/L — ABNORMAL LOW (ref 39–117)
BUN: 25 mg/dL — ABNORMAL HIGH (ref 6–23)
CO2: 23 meq/L (ref 19–32)
Calcium: 10.4 mg/dL (ref 8.4–10.5)
Chloride: 98 meq/L (ref 96–112)
Creatinine, Ser: 1.07 mg/dL (ref 0.40–1.20)
GFR: 44.02 mL/min — ABNORMAL LOW (ref >60.00)
Glucose, Bld: 94 mg/dL (ref 70–99)
Potassium: 4.7 meq/L (ref 3.5–5.1)
Sodium: 131 meq/L — ABNORMAL LOW (ref 135–145)
Total Bilirubin: 0.3 mg/dL (ref 0.2–1.2)
Total Protein: 6.9 g/dL (ref 6.0–8.3)

## 2023-07-30 LAB — CBC WITH DIFFERENTIAL/PLATELET
Basophils Absolute: 0 K/uL (ref 0.0–0.1)
Basophils Relative: 0.4 % (ref 0.0–3.0)
Eosinophils Absolute: 0.1 K/uL (ref 0.0–0.7)
Eosinophils Relative: 1.7 % (ref 0.0–5.0)
HCT: 31.1 % — ABNORMAL LOW (ref 36.0–46.0)
Hemoglobin: 10.3 g/dL — ABNORMAL LOW (ref 12.0–15.0)
Lymphocytes Relative: 15.3 % (ref 12.0–46.0)
Lymphs Abs: 1 K/uL (ref 0.7–4.0)
MCHC: 33.3 g/dL (ref 30.0–36.0)
MCV: 96.5 fl (ref 78.0–100.0)
Monocytes Absolute: 0.7 K/uL (ref 0.1–1.0)
Monocytes Relative: 9.9 % (ref 3.0–12.0)
Neutro Abs: 5 K/uL (ref 1.4–7.7)
Neutrophils Relative %: 72.7 % (ref 43.0–77.0)
Platelets: 219 K/uL (ref 150.0–400.0)
RBC: 3.22 Mil/uL — ABNORMAL LOW (ref 3.87–5.11)
RDW: 13.9 % (ref 11.5–15.5)
WBC: 6.8 K/uL (ref 4.0–10.5)

## 2023-07-30 LAB — IBC + FERRITIN
Ferritin: 664.9 ng/mL — ABNORMAL HIGH (ref 10.0–291.0)
Iron: 61 ug/dL (ref 42–145)
Saturation Ratios: 22.5 % (ref 20.0–50.0)
TIBC: 271.6 ug/dL (ref 250.0–450.0)
Transferrin: 194 mg/dL — ABNORMAL LOW (ref 212.0–360.0)

## 2023-07-30 LAB — VITAMIN B12: Vitamin B-12: 826 pg/mL (ref 211–911)

## 2023-07-30 LAB — TSH: TSH: 1.75 u[IU]/mL (ref 0.35–5.50)

## 2023-07-30 NOTE — Telephone Encounter (Signed)
 Copied from CRM [id number]. Topic: General - Call Back - No Documentation >> Jul 30, 2023  9:24 AM Charlet HERO wrote: Reason for CRM: Patient is calling to let the Dr know that her last b12 shot was 03/22/23 and last labs were on 04/09/23.

## 2023-07-30 NOTE — Telephone Encounter (Signed)
 Prolia VOB initiated via [email or web address]  Next Prolia inj DUE: NOW

## 2023-07-30 NOTE — Telephone Encounter (Signed)
 Pt ready for scheduling for PROLIA  on or after : 07/30/23  Option# 1: Buy/Bill (Office supplied medication)  Out-of-pocket cost due at time of clinic visit: $332  Number of injection/visits approved: ---  Primary: [employer] ADVANTAGE Prolia  co-insurance: 20% Admin fee co-insurance: 0%  Secondary: --- Prolia  co-insurance:  Admin fee co-insurance:   Medical Benefit Details: Date Benefits were checked: 07/30/23 Deductible: NO/ Coinsurance: 20%/ Admin Fee: 0%  Prior Auth: N/A PA# Expiration Date:   # of doses approved: ----------------------------------------------------------------------- Option# 2- Med Obtained from pharmacy:  Pharmacy benefit: Copay [phone] (Paid to pharmacy) Admin Fee: 0% (Pay at clinic)  Prior Auth: N/A PA# Expiration Date:   # of doses approved:   If patient wants fill through the pharmacy benefit please send prescription to: [facility], and include estimated need by date in rx notes. Pharmacy will ship medication directly to the office.  Patient NOT eligible for Prolia  Copay Card. Copay Card can make patient's cost as little as $25. Link to apply: [email or web address]://www.amgensupportplus.com/copay  ** This summary of benefits is an estimation of the patient's out-of-pocket cost. Exact cost may very based on individual plan coverage.

## 2023-07-30 NOTE — Progress Notes (Signed)
 Hgb stable but chronically low Ferritin high-chronic inflammation B12 is very good so can continue the daily or even taking it a couple of times/week Sodium a little low, but not bad-does she drink a lot of water?(Iff too much, can make low)  Repeat bmp 1 month-hyponatremia, and cbcd-anemia.  Want to monitor stabilty

## 2023-08-05 ENCOUNTER — Telehealth: Payer: Self-pay | Admitting: *Deleted

## 2023-08-05 NOTE — Telephone Encounter (Signed)
 Copied from CRM [id number]. Topic: General - Other >> Aug 05, 2023 10:38 AM Beth Wise wrote: Reason for CRM: patient called stating she would like to know what her copay will be for the prolia  shot  CB [phone]

## 2023-08-05 NOTE — Telephone Encounter (Signed)
 Pt is scheduled 08/13/23 for Prolia 

## 2023-08-06 NOTE — Telephone Encounter (Signed)
 Medical Benefits: Co-insurance due at time of visit: 20% (approximately $332) Prior authorization is not required   Pharmacy Benefit Cost due to pharmacy: [phone]

## 2023-08-06 NOTE — Telephone Encounter (Signed)
 Called pt and reviewed OOP cost.   Appt scheduled 08/13/23.

## 2023-08-07 ENCOUNTER — Other Ambulatory Visit: Payer: Self-pay | Admitting: Family Medicine

## 2023-08-12 ENCOUNTER — Telehealth: Payer: Self-pay | Admitting: Family Medicine

## 2023-08-12 NOTE — Telephone Encounter (Signed)
 Patient notified of message below. Patient questioned about taking B complex vitamins and doing an iron  drip. She stated she has done an iron  drip before. Patient wanted to know if there was a brand of vitamins pcp think is the best.

## 2023-08-12 NOTE — Telephone Encounter (Unsigned)
 Copied from CRM #[id number]. Topic: General - Other >> Aug 12, 2023  9:54 AM Beth Wise wrote: Reason for CRM: Pt would like a call [phone] back regarding taking medication for her arthritis.

## 2023-08-12 NOTE — Telephone Encounter (Signed)
 Patient wanted to know if it would be okay for her to take glucosamine chondroitin with msm for arthritis. She stated she used to take it before, but stopped due to taking gabapentin, but she no longer take gabapentin. She stated she takes 600 mg ibuprofen , 3 or 4 tylenol a day when needed and uses the lidocaine  patch. Please advise.

## 2023-08-13 ENCOUNTER — Ambulatory Visit

## 2023-08-13 DIAGNOSIS — M810 Age-related osteoporosis without current pathological fracture: Secondary | ICD-10-CM | POA: Diagnosis not present

## 2023-08-13 MED ORDER — DENOSUMAB 60 MG/ML ~~LOC~~ SOSY
60.0000 mg | PREFILLED_SYRINGE | Freq: Once | SUBCUTANEOUS | Status: AC
  Administered 2023-08-13 (×2): 60 mg via SUBCUTANEOUS

## 2023-08-13 MED ORDER — DENOSUMAB 60 MG/ML ~~LOC~~ SOSY
60.0000 mg | PREFILLED_SYRINGE | SUBCUTANEOUS | Status: AC
Start: 2023-08-13 — End: ?

## 2023-08-13 NOTE — Progress Notes (Signed)
 Patient is in office today for a nurse visit for  Prolia injection . Patient Injection was given in the  Left arm. Patient tolerated injection well.

## 2023-08-15 NOTE — Telephone Encounter (Signed)
 Patient called wanting an update on question below. Patient requests a call back when able.

## 2023-08-15 NOTE — Telephone Encounter (Signed)
 Please see message below and advise.

## 2023-08-16 NOTE — Telephone Encounter (Signed)
 Patient notified of message below and verbalized understanding.

## 2023-08-23 ENCOUNTER — Telehealth: Payer: Self-pay | Admitting: *Deleted

## 2023-08-23 NOTE — Telephone Encounter (Signed)
 Copied from CRM [id number]. Topic: Referral - Request for Referral >> Aug 23, 2023  1:30 PM Rosina BIRCH wrote: Did the patient discuss referral with their provider in the last year? No (If No - schedule appointment) (If Yes - send message)  Appointment offered? No  Type of order/referral and detailed reason for visit: dermatologist   Preference of office, provider, location: [place]  If referral order, have you been seen by this specialty before? No (If Yes, this issue or another issue? When? Where?  Can we respond through MyChart? Yes Patient did not want to make an appointment to discuss the referral with her provider she  just wanted a recommendation

## 2023-08-26 NOTE — Telephone Encounter (Signed)
 Patient notified of message below and stated she would call back later to schedule an appointment.

## 2023-09-11 ENCOUNTER — Ambulatory Visit: Payer: Self-pay

## 2023-09-11 ENCOUNTER — Telehealth: Payer: Self-pay | Admitting: *Deleted

## 2023-09-11 NOTE — Telephone Encounter (Signed)
 Copied from CRM [id number]. Topic: Clinical - Medication Prior Auth >> Sep 11, 2023 12:32 PM Lauren C wrote: Reason for CRM: PA requested for lidocaine  (LIDODERM ) 5 %

## 2023-09-11 NOTE — Telephone Encounter (Signed)
 This RN attempted to return patient's call. No answer, left voicemail with callback number. Will route to office for follow up.      Copied from CRM #[id number]. Topic: Clinical - Medication Question >> Sep 11, 2023  4:59 PM Wess RAMAN wrote: Reason for CRM: Patient would like to know if Dr. Wendolyn would approve of her getting a higher dosage of  acetaminophen (TYLENOL) and ibuprofen  (ADVIL ) tablet. She would like the max that is approved through Medicare due to it not helping with the pain but only tolerating it. She is aware of the medication affecting her kidney and bladder and she does not want to go over that.  Callback #: [phone]  Pharmacy: [facility] [place], [place] - [address] [place] [address] Phone: [phone] Fax: [phone] Hours: Not open 24 hours [facility] [place]

## 2023-09-11 NOTE — Telephone Encounter (Signed)
 FYI Only or Action Required?: Action required by provider: clinical question for provider.  Patient was last seen in primary care on 07/29/2023 by [name], MD.  Called Nurse Triage reporting Medication Question.  Symptoms began several months ago.  Interventions attempted: Prescription medications: Ibuprofen  and Tylenol.  Symptoms are: unchanged.  Triage Disposition: Call PCP When Office is Open  Patient/caregiver understands and will follow disposition?: Yes        Copied from CRM [id number]. Topic: Clinical - Medication Question >> Sep 11, 2023  4:59 PM Wess RAMAN wrote: Reason for CRM: Patient would like to know if Dr. Wendolyn would approve of her getting a higher dosage of  acetaminophen (TYLENOL) and ibuprofen  (ADVIL ) tablet. She would like the max that is approved through Medicare due to it not helping with the pain but only tolerating it. She is aware of the medication affecting her kidney and bladder and she does not want to go over that.  Callback #: [phone]  Pharmacy: [facility] [place], [place] - [address] [place] [address] Phone: [phone] Fax: [phone] Hours: Not open 24 hours [facility] [place]         Reason for Disposition  [1] Caller has NON-URGENT medicine question about med that PCP prescribed AND [2] triager unable to answer question  Answer Assessment - Initial Assessment Questions 1. NAME of MEDICINE: What medicine(s) are you calling about?     Acetaminophen and Ibuprofen   2. QUESTION: What is your question? (e.g., double dose of medicine, side effect)     Could the doses be increased to help with her pain 3. PRESCRIBER: Who prescribed the medicine? Reason: if prescribed by specialist, call should be referred to that group.     Dr. Wendolyn 4. SYMPTOMS: Do you have any symptoms? If Yes, ask: What symptoms are you having?  How bad are the symptoms  (e.g., mild, moderate, severe)     Lower back pain since a fall in April  Protocols used: Medication Question Call-A-AH

## 2023-09-12 ENCOUNTER — Other Ambulatory Visit (HOSPITAL_COMMUNITY): Payer: Self-pay

## 2023-09-12 ENCOUNTER — Telehealth: Payer: Self-pay

## 2023-09-12 NOTE — Telephone Encounter (Signed)
 Pharmacy Patient Advocate Encounter   Received notification from Pt Calls Messages that prior authorization for Lidoderm  5% is required/requested.   Insurance verification completed.   The patient is insured through [facility] [employer] .   Per test claim:   Medication only covered for post-herpetic neuralgia.

## 2023-09-12 NOTE — Telephone Encounter (Signed)
See message below and advise.

## 2023-09-17 ENCOUNTER — Telehealth: Payer: Self-pay | Admitting: *Deleted

## 2023-09-17 NOTE — Telephone Encounter (Signed)
 Copied from CRM #[id number]. Topic: Clinical - Medication Question >> Sep 11, 2023  4:59 PM Wess RAMAN wrote: Reason for CRM: Patient would like to know if Dr. Wendolyn would approve of her getting a higher dosage of  acetaminophen (TYLENOL) and ibuprofen  (ADVIL ) tablet. She would like the max that is approved through Medicare due to it not helping with the pain but only tolerating it. She is aware of the medication affecting her kidney and bladder and she does not want to go over that.  Callback #: [phone]  Pharmacy: [facility] [place] [place], [place] - [address] [place] [address] Phone: [phone] Fax: [phone] Hours: Not open 24 hours [facility] [place] >> Sep 17, 2023 11:00 AM Chiquita SQUIBB wrote: Patient is calling in regarding this again, it looks like Dr. Wendolyn did put a message in to be relayed to the patient but the patient has a lot of clinical questions. Please contact the patient to go over this.

## 2023-09-18 ENCOUNTER — Telehealth: Payer: Self-pay | Admitting: *Deleted

## 2023-09-18 ENCOUNTER — Other Ambulatory Visit: Payer: Self-pay | Admitting: Family Medicine

## 2023-09-18 DIAGNOSIS — M48061 Spinal stenosis, lumbar region without neurogenic claudication: Secondary | ICD-10-CM

## 2023-09-18 MED ORDER — LIDOCAINE 5 % EX PTCH
1.0000 | MEDICATED_PATCH | CUTANEOUS | 12 refills | Status: AC
Start: 2023-09-18 — End: ?

## 2023-09-18 NOTE — Telephone Encounter (Signed)
 Noted

## 2023-09-18 NOTE — Telephone Encounter (Signed)
 Copied from CRM #[id number]. Topic: General - Other >> Sep 18, 2023 10:14 AM DeAngela L wrote: Reason for CRM: [employer] will contact the office about the PA for the lidocaine  (LIDODERM ) 5 % (72 hours the insurance company will call) Patient is upset that she was mislead with a conversation about lidocaine    Patient is asking if Dr Wendolyn office could call her if this prescription will be sent to the [facility] pharmacy today for her to get a refill for Lidocaine   Patient states this has to be called in by 2pm at [place] in order for her to get this before Monday   Pt  num [phone] [facility])  [facility] [place], [place] - [address] [facility] [address] [place] [address] Phone: [phone] Fax: [phone]

## 2023-09-20 NOTE — Telephone Encounter (Signed)
 FYI

## 2023-09-20 NOTE — Telephone Encounter (Signed)
 Patient called in stating that she was informed by [facility] [employer] that they were still waiting on additional information from PCP before determination can be made. I informed pt that as far as we know, PCP has sent over needed information and it's been handled by our PA team. Pt verbalized understanding and is requesting that someone call the [facility] pharmacy @ [phone] in order to see what is going on with the PA.

## 2023-09-23 ENCOUNTER — Telehealth: Payer: Self-pay | Admitting: *Deleted

## 2023-09-23 ENCOUNTER — Other Ambulatory Visit (HOSPITAL_COMMUNITY): Payer: Self-pay

## 2023-09-23 NOTE — Telephone Encounter (Signed)
 Copied from CRM #[id number]. Topic: Clinical - Medication Prior Auth >> Sep 20, 2023  5:31 PM Chiquita SQUIBB wrote: Reason for CRM: Suni from [employer] is calling in stating they have not received anything regarding the lidocaine  (LIDODERM ) 5 %. They are asking this be re faxed to number is [phone] Reference [phone]. Patient is requesting a call back from the prior authorization team. Patient is very frustrated by this and would like to know why this has not happened. Patient is asking for the medication be sent to [facility] on [address].

## 2023-09-24 ENCOUNTER — Ambulatory Visit (INDEPENDENT_AMBULATORY_CARE_PROVIDER_SITE_OTHER): Admitting: Family Medicine

## 2023-09-24 ENCOUNTER — Encounter: Payer: Self-pay | Admitting: Family Medicine

## 2023-09-24 VITALS — BP 150/60 | HR 51 | Temp 97.5°F | Resp 16 | Ht 62.0 in | Wt 115.4 lb

## 2023-09-24 DIAGNOSIS — L989 Disorder of the skin and subcutaneous tissue, unspecified: Secondary | ICD-10-CM | POA: Diagnosis not present

## 2023-09-24 DIAGNOSIS — M48061 Spinal stenosis, lumbar region without neurogenic claudication: Secondary | ICD-10-CM | POA: Diagnosis not present

## 2023-09-24 MED ORDER — NAPROXEN 500 MG PO TABS: 500.0000 mg | ORAL_TABLET | Freq: Two times a day (BID) | ORAL | 5 refills | Status: DC

## 2023-09-24 NOTE — Patient Instructions (Addendum)
 It was very nice to see you today!  Tylenol 500mg  4x/day or 650mg  4x/day  Naproxen  rather than ibuprofen -try it.    Covid at pharmacy  Can do icey hot, etc.   Aquaphor for lips

## 2023-09-24 NOTE — Progress Notes (Signed)
 Subjective:     Patient ID: Beth Wise, female    DOB: 11/05/1925, 89 y.o.   MRN: [id number]  Chief Complaint  Patient presents with   Medication Problem    Discuss medications, lidocaine  patches, vaccines   Mass    Growth on lip  Dry skin on nose    HPI Discussed the use of AI scribe software for clinical note transcription with the patient, who gave verbal consent to proceed.  History of Present Illness Beth Wise is a 89 year old female with spinal stenosis who presents with pain management concerns.  She experiences significant pain due to spinal stenosis and is seeking alternatives for pain management. She has been using lidocaine  patches effectively without affecting her cognition, but her insurance has denied coverage, stating they are only approved for postherpetic neuralgia. She is in communication with her insurance and pharmacy to resolve this issue.  She uses lidocaine  cream on her hip and knee, which are also painful. The pain was particularly severe this morning, making it difficult to walk to breakfast. She has experienced gastrointestinal side effects, such as diarrhea, when using the patches on her hip.  She has a history of using various pain medications, including ibuprofen  and Tylenol. She currently takes 600 mg of ibuprofen  and 500 mg of Tylenol. She previously tried Celebrex, which caused joint pain, and is considering trying naproxen  as an alternative.  She has not pursued surgical options for her spinal condition and recalls being told by an orthopedic surgeon that surgery was not an option for her. She has not had any injections for her spine.  She has a history of shingles in 1975 but does not have postherpetic neuralgia. She is in contact with her nurse practitioner at [facility] regarding her pain management.  She is up to date with her COVID vaccinations and plans to receive the new vaccine. She also plans to get her flu shot at an  upcoming clinic.  She reports a growth on her lip that is sometimes painful and interferes with applying lipstick. It is described as a small, sometimes red or pink, growth on the inside of her lip. She also mentions dry skin on her nose, which she manages with moisturizer.    Health Maintenance Due  Topic Date Due   DEXA SCAN  09/20/2021   Mammogram  09/22/2021   Medicare Annual Wellness (AWV)  01/05/2023    Past Medical History:  Diagnosis Date   Allergy    Arthritis    Back pain    Blepharitis    Cancer (HCC)    basal cell on face   Candidal esophagitis (HCC) 2010   Cataract    Chest pain, atypical    Collagenous colitis    Dr. Karyle   Diverticulosis    Clarence' corneal dystrophy    GERD (gastroesophageal reflux disease)    Heart murmur    Hiatal hernia 2010   History of fibromyalgia    Hypercalcemia    Hyperlipidemia    Hypertension    IBS (irritable bowel syndrome)    Insomnia    Osteoporosis    Primary hyperparathyroidism 03/11/2017   Rectocele    Scoliosis    Shingles 1975   at waistline, no chronic pain.     Spinal stenosis    Vaginal prolapse     Past Surgical History:  Procedure Laterality Date   ABDOMINAL HYSTERECTOMY     BREAST BIOPSY  1964  left benign   CARDIOVASCULAR STRESS TEST  12/03/2007   EF 72%   CATARACT EXTRACTION W/ INTRAOCULAR LENS  IMPLANT, BILATERAL     COLONOSCOPY  04/24/2006   diverticulosis   FLEXIBLE SIGMOIDOSCOPY  07/27/2010   diverticulosis, collagenous colitis   TONSILLECTOMY AND ADENOIDECTOMY     UPPER GASTROINTESTINAL ENDOSCOPY  01/29/2008   esophagitis, hiatal hernia, gastritis   US  ECHOCARDIOGRAPHY  10/30/2007    EF 55-60%     Current Outpatient Medications:    acetaminophen (TYLENOL) 500 MG tablet, Take 500 mg by mouth 4 (four) times daily as needed., Disp: , Rfl:    amLODipine (NORVASC) 5 MG tablet, TAKE ONE TABLET BY MOUTH DAILY, Disp: 90 tablet, Rfl: 3   azelastine  (ASTELIN ) 0.1 % nasal spray, Place 1 spray into  both nostrils 2 (two) times daily. Use in each nostril as directed, Disp: 30 mL, Rfl: 4   Biotin (BIOTIN MAXIMUM STRENGTH) 10 MG TABS, Take 1 tablet by mouth daily., Disp: , Rfl:    Cholecalciferol (D3 PO), Take by mouth., Disp: , Rfl:    cyanocobalamin  (VITAMIN B12) 1000 MCG/ML injection, INJECT 1ML INTRAMUSCULARLY ONCE EVERY 30 DAYS, Disp: 10 mL, Rfl: 1   fluticasone  (FLONASE ) 50 MCG/ACT nasal spray, Spray 2 sprays in each nostril daily, Disp: 16 g, Rfl: 4   GuaiFENesin (MUCINEX PO), Take 1 tablet by mouth every 12 (twelve) hours as needed (congestion). , Disp: , Rfl:    hydrocortisone  2.5 % cream, Apply topically 2 (two) times daily., Disp: 30 g, Rfl: 1   ipratropium (ATROVENT ) 0.03 % nasal spray, Place 2 sprays into both nostrils 2 (two) times daily as needed for rhinitis. As directed nasal spray, Disp: 90 mL, Rfl: 1   latanoprost (XALATAN) 0.005 % ophthalmic solution, 1 drop at bedtime., Disp: , Rfl:    lidocaine  (LIDODERM ) 5 %, Place 1 patch onto the skin daily. Remove & Discard patch within 12 hours., Disp: 30 patch, Rfl: 12   loratadine (CLARITIN) 10 MG tablet, Take 10 mg by mouth., Disp: , Rfl:    Multiple Vitamin (MULTIVITAMIN ADULT PO), , Disp: , Rfl:    naproxen  (NAPROSYN ) 500 MG tablet, Take 1 tablet (500 mg total) by mouth 2 (two) times daily with a meal., Disp: 60 tablet, Rfl: 5   Probiotic Product (PROBIOTIC PO), Take by mouth., Disp: , Rfl:    PROLIA  60 MG/ML SOSY injection, Inject into the skin., Disp: , Rfl:    saccharomyces boulardii (FLORASTOR) 250 MG capsule, Take 250 mg by mouth daily., Disp: , Rfl:    temazepam  (RESTORIL ) 15 MG capsule, TAKE ONE CAPSULE BY MOUTH ONCE NIGHTLY AS NEEDED FOR SLEEP, Disp: , Rfl:    timolol  (TIMOPTIC ) 0.5 % ophthalmic solution, 1 drop every morning., Disp: , Rfl:   Current Facility-Administered Medications:    denosumab  (PROLIA ) injection 60 mg, 60 mg, Subcutaneous, Q6 months, [name], MD   denosumab  (PROLIA ) injection 60 mg, 60 mg,  Subcutaneous, Q6 months, [name], MD  Allergies  Allergen Reactions   Amoxicillin Diarrhea   Atorvastatin Calcium Other (See Comments)   Atorvastatin Calcium Nausea Only   Celecoxib Other (See Comments)    Other Reaction(s): joint pain, Myalgias (intolerance)   Cinacalcet     Other Reaction(s): Not available   Cinacalcet Hcl Diarrhea   Erythromycin Other (See Comments)    Gi upset   Erythromycin Base Nausea Only   Ezetimibe Nausea Only    Joint pain   Monistat  [Miconazole ]    Penicillins  Other (See Comments)   Risedronate Sodium Other (See Comments) and Nausea Only    Joint pain   ROS neg/noncontributory except as noted HPI/below      Objective:     BP (!) 150/60 (BP Location: Left Arm, Patient Position: Sitting, Cuff Size: Normal)   Pulse (!) 51   Temp (!) 97.5 F (36.4 C) (Temporal)   Resp 16   Ht 5' 2 (1.575 m)   Wt 115 lb 6 oz (52.3 kg)   SpO2 98%   BMI 21.10 kg/m  Wt Readings from Last 3 Encounters:  09/24/23 115 lb 6 oz (52.3 kg)  07/29/23 112 lb 2 oz (50.9 kg)  03/01/21 107 lb 9.6 oz (48.8 kg)    Physical Exam   Gen: WDWN NAD HEENT: NCAT, conjunctiva not injected, sclera nonicteric CARDIAC: RRR, S1S2+, no murmur.  EXT:  no edema MSK: no gross abnormalities.  NEURO: A&O x3.  CN II-XII intact.  PSYCH: normal mood. Good eye contact Nose-some dry skin L side.  No specific lesion Tiny spot center upper lip.     Assessment & Plan:  Spinal stenosis of lumbar region without neurogenic claudication  Skin lesion  Other orders -     Naproxen ; Take 1 tablet (500 mg total) by mouth 2 (two) times daily with a meal.  Dispense: 60 tablet; Refill: 5  Assessment and Plan Assessment & Plan Chronic pain due to lumbar spinal stenosis and osteoarthritis   Chronic pain is primarily due to lumbar spinal stenosis and osteoarthritis. Pain management is challenging because of age-related medication risks. Lidocaine  patches are effective but not covered by  insurance. She experiences leg, hip, and knee pain with occasional exacerbations. Current regimen includes ibuprofen  and Tylenol, but she is exploring alternatives due to limited efficacy and insurance issues. Discussed NSAID risks, including potential kidney damage and GI bleeding, and the need to balance pain relief with these risks. Prescribe naproxen  500 mg twice daily as an alternative to ibuprofen . Continue Tylenol, consider increasing to 650 mg as needed q 6 hrs. Monitor for naproxen  side effects, including gastrointestinal and renal effects. Advise to continue using lidocaine  patches and cream as needed, paying out of pocket if necessary. Discuss potential use of lidocaine  roll-on cream, but continue over-the-counter purchase due to cost-effectiveness.  Hypertension   Home readings are typically around 137/60.  Actinic damage of lip and nose   Actinic damage is noted on the lip and nose, with occasional discomfort and loose skin. She uses Vaseline for comfort, which is effective. Continue using Vaseline or Aquaphor for lip and nose. Consider dermatology referral if lesions change or symptoms worsen.    Return for as sch in [place].  Jenkins CHRISTELLA Carrel, MD

## 2023-09-25 ENCOUNTER — Telehealth: Payer: Self-pay | Admitting: *Deleted

## 2023-09-25 ENCOUNTER — Other Ambulatory Visit (HOSPITAL_COMMUNITY): Payer: Self-pay

## 2023-09-25 NOTE — Telephone Encounter (Signed)
  Received notification from [employer] that Prior Authorization for Lidocaine  5% patches has been APPROVED from 09/25/23/ to 09/22/24   PA #/Case ID/Reference #: [id number]  -Submitted additional documentation and was able to get patches approved. Pt has a copay of $28.83 with insurance.

## 2023-09-25 NOTE — Telephone Encounter (Signed)
 Copied from CRM #[id number]. Topic: General - Other >> Sep 25, 2023 10:16 AM Delon DASEN wrote: Reason for CRM: Patient wants after care notes from last visit sent to her email, not able to use mychart so please email everything from now on- email is [email or web address]

## 2023-09-25 NOTE — Telephone Encounter (Signed)
 Patient notified of message below and verbalized understanding.

## 2023-09-27 ENCOUNTER — Encounter: Payer: Self-pay | Admitting: Gastroenterology

## 2023-09-30 ENCOUNTER — Ambulatory Visit (INDEPENDENT_AMBULATORY_CARE_PROVIDER_SITE_OTHER)

## 2023-09-30 ENCOUNTER — Encounter: Payer: Self-pay | Admitting: Family Medicine

## 2023-09-30 ENCOUNTER — Ambulatory Visit: Payer: Self-pay

## 2023-09-30 ENCOUNTER — Telehealth: Payer: Self-pay

## 2023-09-30 ENCOUNTER — Ambulatory Visit: Admitting: Family Medicine

## 2023-09-30 VITALS — BP 138/60 | HR 76 | Temp 98.7°F | Ht 63.0 in | Wt 113.0 lb

## 2023-09-30 DIAGNOSIS — R1030 Lower abdominal pain, unspecified: Secondary | ICD-10-CM | POA: Diagnosis not present

## 2023-09-30 DIAGNOSIS — K52831 Collagenous colitis: Secondary | ICD-10-CM | POA: Diagnosis not present

## 2023-09-30 LAB — CBC WITH DIFFERENTIAL/PLATELET
Basophils Absolute: 0.1 K/uL (ref 0.0–0.1)
Basophils Relative: 1.1 % (ref 0.0–3.0)
Eosinophils Absolute: 0.1 K/uL (ref 0.0–0.7)
Eosinophils Relative: 1.5 % (ref 0.0–5.0)
HCT: 30.2 % — ABNORMAL LOW (ref 36.0–46.0)
Hemoglobin: 10.1 g/dL — ABNORMAL LOW (ref 12.0–15.0)
Lymphocytes Relative: 13.4 % (ref 12.0–46.0)
Lymphs Abs: 0.8 K/uL (ref 0.7–4.0)
MCHC: 33.4 g/dL (ref 30.0–36.0)
MCV: 96 fl (ref 78.0–100.0)
Monocytes Absolute: 0.6 K/uL (ref 0.1–1.0)
Monocytes Relative: 9.2 % (ref 3.0–12.0)
Neutro Abs: 4.7 K/uL (ref 1.4–7.7)
Neutrophils Relative %: 74.8 % (ref 43.0–77.0)
Platelets: 207 K/uL (ref 150.0–400.0)
RBC: 3.14 Mil/uL — ABNORMAL LOW (ref 3.87–5.11)
RDW: 14.1 % (ref 11.5–15.5)
WBC: 6.3 K/uL (ref 4.0–10.5)

## 2023-09-30 LAB — COMPREHENSIVE METABOLIC PANEL WITH GFR
ALT: 12 U/L (ref 0–35)
AST: 23 U/L (ref 0–37)
Albumin: 4.3 g/dL (ref 3.5–5.2)
Alkaline Phosphatase: 30 U/L — ABNORMAL LOW (ref 39–117)
BUN: 28 mg/dL — ABNORMAL HIGH (ref 6–23)
CO2: 25 meq/L (ref 19–32)
Calcium: 9.9 mg/dL (ref 8.4–10.5)
Chloride: 98 meq/L (ref 96–112)
Creatinine, Ser: 1.04 mg/dL (ref 0.40–1.20)
GFR: 45.49 mL/min — ABNORMAL LOW (ref >60.00)
Glucose, Bld: 104 mg/dL — ABNORMAL HIGH (ref 70–99)
Potassium: 4.2 meq/L (ref 3.5–5.1)
Sodium: 130 meq/L — ABNORMAL LOW (ref 135–145)
Total Bilirubin: 0.3 mg/dL (ref 0.2–1.2)
Total Protein: 7 g/dL (ref 6.0–8.3)

## 2023-09-30 NOTE — Patient Instructions (Signed)
 Limit the immodium if possible Suppository if needed  Spray bottle for bottom.

## 2023-09-30 NOTE — Telephone Encounter (Signed)
 FYI Only or Action Required?: FYI only for provider.  Patient was last seen in primary care on 09/24/2023 by [name], MD.  Called Nurse Triage reporting Advice Only.  Symptoms began several days ago.  Interventions attempted: Nothing.  Symptoms are: stable.  Triage Disposition: No disposition on file.  Patient/caregiver understands and will follow disposition?:    Copied from CRM [id number]. 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 States she's nauseous but enable to vomit as well as experiencing some stomach pains.. Answer Assessment - Initial Assessment Questions Patient says on last Wednesday her stomach on the inside was jumping like it was going to explode. She says this happens when she's using the bathroom or has to use the bathroom with nausea. She says she thinks her stomach is having epilepsy and is wanting to see the doctor to prescribe something for it. Advised OV today. She says she will take it and if unable to obtain transportation, she will call back.   1. REASON FOR CALL: What is the main reason for your call? or How can I best help you?     Want to speak to doctor about getting on medication for my stomach 2. SYMPTOMS : Do you have any symptoms?      None now 3. OTHER QUESTIONS: Do you have any other questions?     None  Protocols used: Information Only Call - No Triage-A-AH

## 2023-09-30 NOTE — Telephone Encounter (Signed)
 Appt today

## 2023-09-30 NOTE — Progress Notes (Signed)
 Subjective:     Patient ID: Beth Wise, female    DOB: 1925-11-08, 89 y.o.   MRN: [id number]  Chief Complaint  Patient presents with   GI Problem    Patient says on last Wednesday her stomach on the inside was jumping like it was going to explode. She says this happens when she's using the bathroom or has to use the bathroom with nausea. She says she thinks her stomach is having epilepsy and is wanting to see the doctor to prescribe something for it.    Medical Management of Chronic Issues    Want to get some blood to check calcium and sodium; experienced a new symptom on Wed and Sat, bottom lip quivering     HPI Suppository- Discussed the use of AI scribe software for clinical note transcription with the patient, who gave verbal consent to proceed.  History of Present Illness Beth Wise is a 89 year old female with ulcerative colitis who presents with gastrointestinal symptoms and bowel movement irregularities.  She has been experiencing frequent episodes of abdominal discomfort described as 'convulsing' or 'churning' sensations in her stomach for about a year. The patient reports that these episodes are unpredictable and seem to be getting more frequent, lasting longer, and becoming more painful, and are often accompanied by nausea but not vomiting. The pain is located in the lower abdomen, where her uterus used to be, and after such episodes, her stomach remains sensitive and sore.  Her bowel movements have been irregular, with instances of constipation following the use of Imodium . She takes two Imodium  after an episode of loose stools, which led to constipation lasting from Wednesday night until late Sunday, during which she experienced firm, small, ball-like stools. She has a history of ulcerative colitis, which she has experienced three or four times in her life, with the last episode occurring three to four years ago. She is concerned about the potential for diarrhea and  colitis recurrence, which influences her decision to use Imodium  if any loose stool occurs. She has previously used Miralax and Colace for bowel regulation but has not used them recently.  She has been using lidocaine  patches, which she initially thought might be causing her gastrointestinal symptoms, but she has since used them without issue. She also takes a probiotic and occasionally eats yogurt to support gut health. She reports a decrease in appetite and has been eating less, with yogurt being a primary food choice recently.  She mentions a persistent issue with vaginal discomfort, which she manages by showering daily to clean the area. No burning during urination but reports vaginal discomfort and sensitivity.  She has not seen her gastroenterologist in three to four years but has been in contact with his nurse to schedule an appointment. She is currently taking a probiotic supplement.    Health Maintenance Due  Topic Date Due   DEXA SCAN  09/20/2021   Mammogram  09/22/2021   Medicare Annual Wellness (AWV)  01/05/2023    Past Medical History:  Diagnosis Date   Allergy    Arthritis    Back pain    Blepharitis    Cancer (HCC)    basal cell on face   Candidal esophagitis (HCC) 2010   Cataract    Chest pain, atypical    Collagenous colitis    Dr. Karyle   Diverticulosis    Clarence' corneal dystrophy    GERD (gastroesophageal reflux disease)    Heart murmur    Hiatal  hernia 2010   History of fibromyalgia    Hypercalcemia    Hyperlipidemia    Hypertension    IBS (irritable bowel syndrome)    Insomnia    Osteoporosis    Primary hyperparathyroidism 03/11/2017   Rectocele    Scoliosis    Shingles 1975   at waistline, no chronic pain.     Spinal stenosis    Vaginal prolapse     Past Surgical History:  Procedure Laterality Date   ABDOMINAL HYSTERECTOMY     BREAST BIOPSY  1964   left benign   CARDIOVASCULAR STRESS TEST  12/03/2007   EF 72%   CATARACT EXTRACTION W/  INTRAOCULAR LENS  IMPLANT, BILATERAL     COLONOSCOPY  04/24/2006   diverticulosis   FLEXIBLE SIGMOIDOSCOPY  07/27/2010   diverticulosis, collagenous colitis   TONSILLECTOMY AND ADENOIDECTOMY     UPPER GASTROINTESTINAL ENDOSCOPY  01/29/2008   esophagitis, hiatal hernia, gastritis   US  ECHOCARDIOGRAPHY  10/30/2007    EF 55-60%     Current Outpatient Medications:    acetaminophen (TYLENOL) 500 MG tablet, Take 500 mg by mouth 4 (four) times daily as needed., Disp: , Rfl:    amLODipine (NORVASC) 5 MG tablet, TAKE ONE TABLET BY MOUTH DAILY, Disp: 90 tablet, Rfl: 3   azelastine  (ASTELIN ) 0.1 % nasal spray, Place 1 spray into both nostrils 2 (two) times daily. Use in each nostril as directed, Disp: 30 mL, Rfl: 4   Biotin (BIOTIN MAXIMUM STRENGTH) 10 MG TABS, Take 1 tablet by mouth daily., Disp: , Rfl:    Cholecalciferol (D3 PO), Take by mouth., Disp: , Rfl:    cyanocobalamin  (VITAMIN B12) 1000 MCG/ML injection, INJECT 1ML INTRAMUSCULARLY ONCE EVERY 30 DAYS, Disp: 10 mL, Rfl: 1   fluticasone  (FLONASE ) 50 MCG/ACT nasal spray, Spray 2 sprays in each nostril daily, Disp: 16 g, Rfl: 4   GuaiFENesin (MUCINEX PO), Take 1 tablet by mouth every 12 (twelve) hours as needed (congestion). , Disp: , Rfl:    hydrocortisone  2.5 % cream, Apply topically 2 (two) times daily., Disp: 30 g, Rfl: 1   ipratropium (ATROVENT ) 0.03 % nasal spray, Place 2 sprays into both nostrils 2 (two) times daily as needed for rhinitis. As directed nasal spray, Disp: 90 mL, Rfl: 1   latanoprost (XALATAN) 0.005 % ophthalmic solution, 1 drop at bedtime., Disp: , Rfl:    lidocaine  (LIDODERM ) 5 %, Place 1 patch onto the skin daily. Remove & Discard patch within 12 hours., Disp: 30 patch, Rfl: 12   loratadine (CLARITIN) 10 MG tablet, Take 10 mg by mouth., Disp: , Rfl:    Multiple Vitamin (MULTIVITAMIN ADULT PO), , Disp: , Rfl:    naproxen  (NAPROSYN ) 500 MG tablet, Take 1 tablet (500 mg total) by mouth 2 (two) times daily with a meal., Disp:  60 tablet, Rfl: 5   Probiotic Product (PROBIOTIC PO), Take by mouth., Disp: , Rfl:    PROLIA  60 MG/ML SOSY injection, Inject into the skin., Disp: , Rfl:    temazepam  (RESTORIL ) 15 MG capsule, TAKE ONE CAPSULE BY MOUTH ONCE NIGHTLY AS NEEDED FOR SLEEP, Disp: , Rfl:    timolol  (TIMOPTIC ) 0.5 % ophthalmic solution, 1 drop every morning., Disp: , Rfl:   Current Facility-Administered Medications:    denosumab  (PROLIA ) injection 60 mg, 60 mg, Subcutaneous, Q6 months, [name], MD   denosumab  (PROLIA ) injection 60 mg, 60 mg, Subcutaneous, Q6 months, [name], MD  Allergies  Allergen Reactions   Amoxicillin Diarrhea  Atorvastatin Calcium Other (See Comments)   Atorvastatin Calcium Nausea Only   Celecoxib Other (See Comments)    Other Reaction(s): joint pain, Myalgias (intolerance)   Cinacalcet     Other Reaction(s): Not available   Cinacalcet Hcl Diarrhea   Erythromycin Other (See Comments)    Gi upset   Erythromycin Base Nausea Only   Ezetimibe Nausea Only    Joint pain   Monistat  [Miconazole ]    Penicillins Other (See Comments)   Risedronate Sodium Other (See Comments) and Nausea Only    Joint pain   ROS neg/noncontributory except as noted HPI/below      Objective:     BP 138/60   Pulse 76   Temp 98.7 F (37.1 C)   Ht 5' 3 (1.6 m)   Wt 113 lb (51.3 kg)   SpO2 95%   BMI 20.02 kg/m  Wt Readings from Last 3 Encounters:  09/30/23 113 lb (51.3 kg)  09/24/23 115 lb 6 oz (52.3 kg)  07/29/23 112 lb 2 oz (50.9 kg)    Physical Exam   Gen: WDWN NAD HEENT: NCAT, conjunctiva not injected, sclera nonicteric NECK:  supple, no thyromegaly, no nodes, no carotid bruits CARDIAC: RRR, S1S2+, no murmur.  LUNGS: CTAB. No wheezes ABDOMEN:  BS+, soft, NTND, No HSM, no masses EXT:  no edema MSK: no gross abnormalities. walker NEURO: A&O x3.  CN II-XII intact.  PSYCH: normal mood. Good eye contact     Assessment & Plan:  Lower abdominal pain -     CBC with  Differential/Platelet -     Comprehensive metabolic panel with GFR -     DG Abd 2 Views; Future  Collagenous colitis  Assessment and Plan Assessment & Plan Chronic gastrointestinal symptoms and constipation   She experiences intermittent abdominal spasms with nausea and variable bowel movements for about a year, with increasing frequency and severity. There is no vomiting. She has a history of ulcerative colitis without recent flare-ups and concerns about Imodium  overuse leading to constipation. Order an abdominal x-ray to assess constipation-related issues. Limit Imodium  use and avoid it after a single loose bowel movement. Use suppositories if needed for constipation but avoid dependency. Consider Miralax for constipation management. Discuss potential spasm medication if symptoms persist-declined as wants preventative. Follow up with GI specialist, Dr. Enid, for further evaluation.  Decreased appetite   Her decreased appetite may be related to gastrointestinal symptoms. Monitor dietary intake and appetite.  Chronic vulvar/vaginal discomfort   She has chronic soreness and sensitivity, exacerbated by gastrointestinal symptoms, without burning or itching. Use a spray bottle with warm water for hygiene at night to reduce discomfort. Continue daily showers for hygiene.  Hyponatremia   She has low sodium levels without specific symptoms discussed in this visit. Monitor sodium levels with blood work.    Return if symptoms worsen or fail to improve.  Jenkins CHRISTELLA Carrel, MD

## 2023-09-30 NOTE — Telephone Encounter (Signed)
 Copied from CRM #[id number]. Topic: General - Other >> Sep 30, 2023  9:31 AM Pinkey ORN wrote: Reason for CRM: Visit Summary >> Sep 30, 2023  9:32 AM Pinkey ORN wrote: Patient is requesting her previous visit summary be emailed to her. Please follow up with patient.   Called pt to advise unable to email AVS due to privacy, however happy to mail to patient. Pt states MyChart is just confusing for her to use and would like this deactivated. Going forward would like printed copy in office at her visit and/or mailed to her address on file. Pt advised coming into the office today for appt if she can get transportation. Will save AVS for patient to get at her appt today, if unable to get transportation advised I will send via mail. Pt verbalized understanding.

## 2023-10-01 ENCOUNTER — Ambulatory Visit: Payer: Self-pay | Admitting: Family Medicine

## 2023-10-01 ENCOUNTER — Telehealth: Payer: Self-pay

## 2023-10-01 NOTE — Telephone Encounter (Signed)
 Beth Wise has an appointment with Harlene Mail, PA 11/19/2023. She would like to be seen sooner. I have put her on the cancellation list. She is having nausea, stomach convulsing and painful. Onset x a year on/off. Makes her stomach tender for a few days after these episodes. She turned 89 in August and said any assistance is appreciated. Will route to Dr Avram to advise about appointment timing.

## 2023-10-01 NOTE — Progress Notes (Signed)
 Labs are stable.  I'm waiting for x-ray to be read to determine next steps

## 2023-10-02 NOTE — Telephone Encounter (Signed)
-----   Message from Jenkins CHRISTELLA Carrel sent at 10/01/2023  8:02 PM EDT ----- Labs are stable.  I'm waiting for x-ray to be read to determine next steps ----- Message ----- From: Interface, Lab In Three Zero One Sent: 09/30/2023   4:21 PM EDT To: Jenkins Earnie Carrel, MD

## 2023-10-02 NOTE — Telephone Encounter (Signed)
 Spoke with patient about recent results and notes per PCP. Pt acknowledged understanding.

## 2023-10-04 ENCOUNTER — Telehealth: Payer: Self-pay

## 2023-10-04 NOTE — Telephone Encounter (Signed)
 You can use one of my available RN visits on the schedule that are open

## 2023-10-04 NOTE — Telephone Encounter (Signed)
 Copied from CRM [id number]. Topic: Clinical - Lab/Test Results >> Oct 04, 2023  2:39 PM Berneda FALCON wrote: Reason for CRM: Patient would like a callback to go over xray results please.  Callback is [phone] (home)

## 2023-10-04 NOTE — Telephone Encounter (Signed)
 Spoke to patient about xray results and if wanted to could order CT for abd/pelvis. Pt acknowledged understanding and stated wanted to hold off on CT for now.

## 2023-10-07 ENCOUNTER — Other Ambulatory Visit: Payer: Self-pay

## 2023-10-07 ENCOUNTER — Other Ambulatory Visit: Payer: Self-pay | Admitting: Family Medicine

## 2023-10-07 NOTE — Telephone Encounter (Signed)
 Copied from CRM [id number]. Topic: Clinical - Medication Question >> Oct 07, 2023  8:56 AM Avram MATSU wrote: Reason for CRM: Pt is wondering if its safe for her to go on ibuprofen  (ADVIL ) 800MG , she does not want to take naproxen  (NAPROSYN ) 500 MG tablet [[id number]]. Please advise [phone] [facility])  [facility] [place], [place] - [address] [facility] [place] [address] Phone: [phone]  Fax: [phone]    Pt is also requesting her fluticasone  (FLONASE ) 50 MCG/ACT nasal spray [[id number]] to be switched over to   [facility] [place], [place] - [address] [place] [address] Phone: [phone]  Fax: [phone]

## 2023-10-07 NOTE — Telephone Encounter (Signed)
 We have had a cancellation 10/09/2023 so I was able to put her in that spot. She is aware of date/time.

## 2023-10-07 NOTE — Telephone Encounter (Signed)
 I have messaged Beth Wise and will await her answer to put her on sooner than 11/19/2023.

## 2023-10-07 NOTE — Telephone Encounter (Signed)
 Rx for flonase  sent to [facility].

## 2023-10-08 ENCOUNTER — Other Ambulatory Visit: Payer: Self-pay | Admitting: Family Medicine

## 2023-10-08 DIAGNOSIS — M48061 Spinal stenosis, lumbar region without neurogenic claudication: Secondary | ICD-10-CM

## 2023-10-08 NOTE — Progress Notes (Unsigned)
 Beth Wise 89 y.o. 05/15/1925 [id number]  Assessment & Plan:   Encounter Diagnoses  Name Primary?   Lower abdominal pain Yes   Nausea without vomiting    Altered bowel habits   History of collagenous colitis  This sounds like an irritable bowel phenomenon.  I agree with her that if it were her collagenous colitis Imodium  would not stop the diarrhea as it never did in the past.  She is actually having some formed stools.  I do not think she has abdominal epilepsy as that is a seizure disorder with GI symptoms.  I have given her some samples of IBgard to try.  We talked about using dicyclomine and how caution is advised in using it in patients over the age of 41.  However it might be necessary to treat her and she will consider it and let me know.  She will monitor for recurrent episodes of pain, convulsions of the stomach and nausea.  We discussed the possibility of imaging and decided not to pursue a CT scan at this time.  Would reserve colonoscopy for more significant problems and would try to avoid unless absolutely necessary in a 89 year old.  CC: Beth Jenkins Jansky, MD    Subjective:  Patient consented to the use of artificial intelligence scribe Chief Complaint: Stomach convulsions altered bowel habits  HPI 89 year old woman with a history of collagenous colitis who was seen recently by Dr. Wendolyn for abdominal pain.  She also has a history of polymyalgia rheumatica and spinal stenosis.  Recent visit in September complained of convulsions in her abdomen.  Complaining of twisting abdominal pain.  She would take Imodium  for diarrhea and then be constipated for 2 days or so.  Two-view abdominal film showed a nonobstructive bowel gas pattern on September 29.  She has experienced changes in her bowel movements, with stools varying from small, short pieces to thin, narrow pieces. The consistency ranges from loose or runny to soft. She recalls an episode the previous day where  her bowel movement was 'nothing but water.'  She has a history of collagenous colitis but has not been on treatment for it recently. She has used Imodium  in the past to manage symptoms, but it often results in constipation for three to four days.  She describes experiencing abdominal 'convulsions' characterized by rumbling, moving, and churning sensations in her stomach. These episodes are painful and occur in spells, with the last significant episode occurring about two weeks ago. She also reports nausea without vomiting, and keeps a paper bag nearby due to the sensation of impending vomiting, though she has not actually vomited.  Her weight has been stable at around 113 pounds, though she notes a decrease in appetite and food intake. She mentions having weighed 115 pounds two weeks ago but attributes the weight loss to not eating well for a few days.  No bleeding with bowel movements. She reports having anemia for most of her life.   She lives independently at [place] and no longer drives, having sold her car three to four years ago.  Colonoscopy 2019 collagenous colitis and diverticulosis EGD 2019, mild cervical esophageal stenosis with slight scope trauma noted, mild antral gastritis.  Otherwise negative.  Wt Readings from Last 3 Encounters:  10/09/23 113 lb (51.3 kg)  09/30/23 113 lb (51.3 kg)  09/24/23 115 lb 6 oz (52.3 kg)   These are stable times years    Latest Ref Rng & Units 09/30/2023    2:38 PM 07/29/2023  3:33 PM 07/28/2020   12:00 AM  CBC  WBC 4.0 - 10.5 K/uL 6.3  6.8  5.1      Hemoglobin 12.0 - 15.0 g/dL 89.8  89.6  89.1      Hematocrit 36.0 - 46.0 % 30.2  31.1  32      Platelets 150.0 - 400.0 K/uL 207.0  219.0  202         This result is from an external source.    Lab Results  Component Value Date   IRON  61 07/29/2023   TIBC 271.6 07/29/2023   FERRITIN 664.9 (H) 07/29/2023     Lab Results  Component Value Date   NA 130 (L) 09/30/2023   CL 98  09/30/2023   K 4.2 09/30/2023   CO2 25 09/30/2023   BUN 28 (H) 09/30/2023   CREATININE 1.04 09/30/2023   GFR 45.49 (L) 09/30/2023   CALCIUM 9.9 09/30/2023   PHOS 3.5 05/02/2006   ALBUMIN 4.3 09/30/2023   GLUCOSE 104 (H) 09/30/2023    Lab Results  Component Value Date   ALT 12 09/30/2023   AST 23 09/30/2023   ALKPHOS 30 (L) 09/30/2023   BILITOT 0.3 09/30/2023     Allergies  Allergen Reactions   Amoxicillin Diarrhea   Atorvastatin Calcium Other (See Comments)   Atorvastatin Calcium Nausea Only   Celecoxib Other (See Comments)    Other Reaction(s): joint pain, Myalgias (intolerance)   Cinacalcet     Other Reaction(s): Not available   Cinacalcet Hcl Diarrhea   Erythromycin Other (See Comments)    Gi upset   Erythromycin Base Nausea Only   Ezetimibe Nausea Only    Joint pain   Monistat  [Miconazole ]    Penicillins Other (See Comments)   Risedronate Sodium Other (See Comments) and Nausea Only    Joint pain   Current Meds  Medication Sig   acetaminophen (TYLENOL) 500 MG tablet Take 500 mg by mouth 4 (four) times daily as needed.   amLODipine (NORVASC) 5 MG tablet TAKE ONE TABLET BY MOUTH DAILY   b complex vitamins capsule Take 1 capsule by mouth daily.   Biotin (BIOTIN MAXIMUM STRENGTH) 10 MG TABS Take 1 tablet by mouth daily.   Cholecalciferol (D3 PO) Take by mouth.   Cyanocobalamin  (VITAMIN B-12 PO) Take 1 tablet by mouth daily as needed (3 times a week).   fluticasone  (FLONASE ) 50 MCG/ACT nasal spray USE ONE SPRAY IN EACH NOSTRIL DAILY   fluticasone  (FLONASE ) 50 MCG/ACT nasal spray Place 2 sprays into both nostrils daily.   GuaiFENesin (MUCINEX PO) Take 1 tablet by mouth every 12 (twelve) hours as needed (congestion).    hydrocortisone  2.5 % cream Apply topically 2 (two) times daily.   ibuprofen  (ADVIL ) 600 MG tablet Take 1 tablet (600 mg total) by mouth every 8 (eight) hours as needed.   ipratropium (ATROVENT ) 0.03 % nasal spray Place 2 sprays into both nostrils 2  (two) times daily as needed for rhinitis. As directed nasal spray   latanoprost (XALATAN) 0.005 % ophthalmic solution 1 drop at bedtime.   lidocaine  (LIDODERM ) 5 % Place 1 patch onto the skin daily. Remove & Discard patch within 12 hours.   Multiple Vitamin (MULTIVITAMIN ADULT PO)    Probiotic Product (PROBIOTIC PO) Take by mouth.   PROLIA  60 MG/ML SOSY injection Inject into the skin.   temazepam  (RESTORIL ) 15 MG capsule TAKE ONE CAPSULE BY MOUTH ONCE NIGHTLY AS NEEDED FOR SLEEP (Patient taking differently: Take 15 mg by mouth as  needed.)   timolol  (TIMOPTIC ) 0.5 % ophthalmic solution 1 drop every morning.   Current Facility-Administered Medications for the 10/09/23 encounter (Office Visit) with Avram Lupita BRAVO, MD  Medication   denosumab  (PROLIA ) injection 60 mg   denosumab  (PROLIA ) injection 60 mg   Past Medical History:  Diagnosis Date   Allergy    Arthritis    Back pain    Blepharitis    Cancer (HCC)    basal cell on face   Candidal esophagitis (HCC) 2010   Cataract    Chest pain, atypical    Collagenous colitis    Dr. Karyle   Diverticulosis    Clarence' corneal dystrophy    GERD (gastroesophageal reflux disease)    Heart murmur    Hiatal hernia 2010   History of fibromyalgia    Hypercalcemia    Hyperlipidemia    Hypertension    IBS (irritable bowel syndrome)    Insomnia    Osteoporosis    Primary hyperparathyroidism 03/11/2017   Rectocele    Scoliosis    Shingles 1975   at waistline, no chronic pain.     Spinal stenosis    Vaginal prolapse    Past Surgical History:  Procedure Laterality Date   ABDOMINAL HYSTERECTOMY     BREAST BIOPSY  1964   left benign   CARDIOVASCULAR STRESS TEST  12/03/2007   EF 72%   CATARACT EXTRACTION W/ INTRAOCULAR LENS  IMPLANT, BILATERAL     COLONOSCOPY  04/24/2006   diverticulosis   FLEXIBLE SIGMOIDOSCOPY  07/27/2010   diverticulosis, collagenous colitis   TONSILLECTOMY AND ADENOIDECTOMY     UPPER GASTROINTESTINAL ENDOSCOPY   01/29/2008   esophagitis, hiatal hernia, gastritis   US  ECHOCARDIOGRAPHY  10/30/2007    EF 55-60%   Social History   Social History Narrative   Social History      Diet? Regular (shouldn't have excessive amts of acid, hot &spicey foods)      Do you drink/eat things with caffeine? yes      Marital status?       widow                             What year were you married? 62 - 1988      Do you live in a house, apartment, assisted living, condo, trailer, etc.? apartment      Is it one or more stories? 1      How many persons live in your home? 1 (self)      Do you have any pets in your home? (please list) no no !!      Highest level of education completed?      Current or past profession: hairdresser      Do you exercise?      little                                Type & how often? Leg lifts and plank, stretching every day.      Advanced Directives      Do you have a living will?  yes      Do you have a DNR form?         yes                         If not, do you want to  discuss one? yes      Do you have signed POA/HPOA for forms? yes      Functional Status      Do you have difficulty bathing or dressing yourself?      Do you have difficulty preparing food or eating?       Do you have difficulty managing your medications?      Do you have difficulty managing your finances?      Do you have difficulty affording your medications?      2 grands 1 great   family history includes Breast cancer in her mother; Cancer - Other in her sister; Cirrhosis in her sister; Heart disease in her brother, brother, father, and sister; Pneumonia in her sister; Prostate cancer in her son; Stomach cancer in her sister; Stroke in her mother.   Review of Systems As per HPI  Objective:   Physical Exam @Ht  5' 2 (1.575 m)   Wt 113 lb (51.3 kg)   BMI 20.67 kg/m @  General:  NAD Eyes:   anicteric Lungs:  clear, chest cavity kyphoscoliosis (levoscoliosis) Heart::  S1S2 no rubs,  murmurs or gallops Abdomen:  soft and nontender, BS+ Ext:   no edema, cyanosis or clubbing yeah, I could see welcome  Rectal exam with Lyle Ishihara, CMA present Normal anoderm Normal resting anorectal tone Soft/semiformed light brown stool heme-negative no mass  Data Reviewed:  See HPI

## 2023-10-08 NOTE — Telephone Encounter (Signed)
 Copied from CRM [id number]. Topic: Clinical - Medication Question >> Oct 07, 2023  8:56 AM Avram MATSU wrote: Reason for CRM: Pt is wondering if its safe for her to go on ibuprofen  (ADVIL ) 800MG , she does not want to take naproxen  (NAPROSYN ) 500 MG tablet [[id number]]. Please advise [phone] [facility])  [facility] [place], [place] - [address] [facility] [address] [place] [address] Phone: [phone]  Fax: [phone]    Pt is also requesting her fluticasone  (FLONASE ) 50 MCG/ACT nasal spray [[id number]] to be switched over to   [facility] [place], [place] - [address] [place] [address] Phone: [phone]  Fax: [phone] >> Oct 08, 2023  9:58 AM Armenia J wrote: Patient is calling back to get an update from [facility]. She is wanted the ibuprofen  to be sent in before 1:00 PM today. Please call patient as soon as possible so she can have an update on her inquiry.

## 2023-10-09 ENCOUNTER — Encounter: Payer: Self-pay | Admitting: Internal Medicine

## 2023-10-09 ENCOUNTER — Other Ambulatory Visit: Payer: Self-pay | Admitting: Family

## 2023-10-09 ENCOUNTER — Ambulatory Visit: Admitting: Internal Medicine

## 2023-10-09 ENCOUNTER — Telehealth: Payer: Self-pay

## 2023-10-09 VITALS — BP 124/64 | HR 62 | Ht 62.0 in | Wt 113.0 lb

## 2023-10-09 DIAGNOSIS — R110 Nausea: Secondary | ICD-10-CM | POA: Diagnosis not present

## 2023-10-09 DIAGNOSIS — R194 Change in bowel habit: Secondary | ICD-10-CM | POA: Diagnosis not present

## 2023-10-09 DIAGNOSIS — R1030 Lower abdominal pain, unspecified: Secondary | ICD-10-CM | POA: Diagnosis not present

## 2023-10-09 MED ORDER — FLUTICASONE PROPIONATE 50 MCG/ACT NA SUSP: 2.0000 | Freq: Every day | NASAL | 6 refills | Status: AC

## 2023-10-09 NOTE — Telephone Encounter (Signed)
 Copied from CRM [id number]. Topic: Clinical - Medication Question >> Oct 07, 2023  8:56 AM Avram MATSU wrote: Reason for CRM: Pt is wondering if its safe for her to go on ibuprofen  (ADVIL ) 800MG , she does not want to take naproxen  (NAPROSYN ) 500 MG tablet [[id number]]. Please advise [phone] [facility])  [facility] [place], [place] - [address] [facility] [address] [facility] [address] [place] [address] Phone: [phone]  Fax: [phone]    Pt is also requesting her fluticasone  (FLONASE ) 50 MCG/ACT nasal spray [[id number]] to be switched over to   [facility] [place], [place] - [address] [place] [address] Phone: [phone]  Fax: [phone] >> Oct 09, 2023  8:49 AM Montie POUR wrote: I let Trang know that ADVIL  600 MG was sent today to pharmacy. Evamarie would like a nurse or Dr. Wendolyn to call her to let her know why she cannot take ADVIL  800 MG. Please advise [phone] >> Oct 08, 2023  9:58 AM Armenia J wrote: Patient is calling back to get an update from [facility]. She is wanted the ibuprofen  to be sent in before 1:00 PM today. Please call patient as soon as possible so she can have an update on her inquiry.

## 2023-10-09 NOTE — Patient Instructions (Addendum)
 Great to see you today!!  We are providing you with IBgard samples. Try 2 if you get another abdominal spell to see if it helps.  If you want to try dicyclomine let us  know.   I appreciate the opportunity to care for you. Lupita Commander, MD, [facility]

## 2023-10-10 ENCOUNTER — Telehealth: Payer: Self-pay | Admitting: *Deleted

## 2023-10-10 NOTE — Telephone Encounter (Signed)
 Copied from CRM [id number]. Topic: Clinical - Prescription Issue >> Oct 09, 2023  6:05 PM Shereese L wrote: Reason for CRM: patient called in and stated that her medication  fluticasone  (FLONASE ) 50 MCG/ACT nasal spray needs to be redirected to [employer] [facility]

## 2023-10-22 ENCOUNTER — Encounter: Payer: Self-pay | Admitting: Family Medicine

## 2023-10-22 ENCOUNTER — Ambulatory Visit: Admitting: Family Medicine

## 2023-10-22 VITALS — BP 122/58 | HR 71 | Temp 100.3°F | Ht 62.0 in | Wt 115.0 lb

## 2023-10-22 DIAGNOSIS — N3000 Acute cystitis without hematuria: Secondary | ICD-10-CM | POA: Diagnosis not present

## 2023-10-22 DIAGNOSIS — R509 Fever, unspecified: Secondary | ICD-10-CM | POA: Diagnosis not present

## 2023-10-22 DIAGNOSIS — R6883 Chills (without fever): Secondary | ICD-10-CM

## 2023-10-22 LAB — POC URINALSYSI DIPSTICK (AUTOMATED)
Blood, UA: NEGATIVE
Color, UA: NORMAL
Glucose, UA: NEGATIVE
Ketones, UA: NEGATIVE
Nitrite, UA: NEGATIVE
Protein, UA: NEGATIVE
Spec Grav, UA: 1.02
Urobilinogen, UA: 0.2 U/dL
pH, UA: 5.5

## 2023-10-22 MED ORDER — CEPHALEXIN 500 MG PO CAPS: 500.0000 mg | ORAL_CAPSULE | Freq: Two times a day (BID) | ORAL | 0 refills | Status: AC

## 2023-10-22 NOTE — Progress Notes (Signed)
 Subjective:     Patient ID: Beth Wise, female    DOB: 1925-06-18, 89 y.o.   MRN: [id number]  Chief Complaint  Patient presents with   Chills    Felt on Sunday morning; body stiffed up and was shaking for about an hour; no other symptoms; feeling fine today; wanted to discuss continuing biotin   Ear Fullness    L ear feels swollen but not painful, no drainage; been off and on    Discussed the use of AI scribe software for clinical note transcription with the patient, who gave verbal consent to proceed.  History of Present Illness Beth Wise is a 89 year old female who presents with episodes of body stiffness, shivering, and chills.  On Sunday morning, she experienced an episode of whole-body stiffness and muscle rigidity around 4:30 AM when she got up to use the bathroom. After returning to bed, she began shivering uncontrollably, with her hands and teeth chattering, despite not feeling cold. She attempted to warm up by covering herself with a spread, but the shivering persisted.  Following the episode, she felt exhausted and weak, struggling to get out of bed. She managed to eat a small breakfast of yogurt and a Belvita biscuit before returning to bed. She spent most of Sunday in bed, feeling weak and unable to sit up until late afternoon. When she did sit up, her head felt heavy, which she attributed to weakness. No recent immunizations  She denies runny nose, congestion, sore throat, cough, shortness of breath, vomiting, or diarrhea. She reports irregular bowel movements, sometimes feeling incomplete after a bowel movement, with occasional watery and lumpy stools. .  On Monday, she felt fine and did not require a clinic nurse. However, she decided to make an appointment for further evaluation. She also mentioned a swollen left foot, which she attempted to treat with baby oil but found it difficult to reach due to the swelling.  She reported pain in her left leg, particularly in  the bone area where she applied a patch. The pain was exacerbated by leg lifts in bed but improved with movement. She took a 650 mg aspirin in the morning, which helped alleviate the pain.  She lives in independent living at [facility] and values her autonomy, including managing her own schedule and medications. She takes biotin for hair and nails and uses a probiotic.    Health Maintenance Due  Topic Date Due   DEXA SCAN  09/20/2021   Mammogram  09/22/2021   Medicare Annual Wellness (AWV)  01/05/2023   COVID-19 Vaccine (9 - 2025-26 season) 09/02/2023    Past Medical History:  Diagnosis Date   Allergy    Arthritis    Back pain    Blepharitis    Cancer (HCC)    basal cell on face   Candidal esophagitis (HCC) 2010   Cataract    Chest pain, atypical    Collagenous colitis    Dr. Karyle   Diverticulosis    Clarence' corneal dystrophy    GERD (gastroesophageal reflux disease)    Heart murmur    Hiatal hernia 2010   History of fibromyalgia    Hypercalcemia    Hyperlipidemia    Hypertension    IBS (irritable bowel syndrome)    Insomnia    Osteoporosis    Primary hyperparathyroidism 03/11/2017   Rectocele    Scoliosis    Shingles 1975   at waistline, no chronic pain.     Spinal stenosis  Vaginal prolapse     Past Surgical History:  Procedure Laterality Date   ABDOMINAL HYSTERECTOMY     BREAST BIOPSY  1964   left benign   CARDIOVASCULAR STRESS TEST  12/03/2007   EF 72%   CATARACT EXTRACTION W/ INTRAOCULAR LENS  IMPLANT, BILATERAL     COLONOSCOPY  04/24/2006   diverticulosis   FLEXIBLE SIGMOIDOSCOPY  07/27/2010   diverticulosis, collagenous colitis   TONSILLECTOMY AND ADENOIDECTOMY     UPPER GASTROINTESTINAL ENDOSCOPY  01/29/2008   esophagitis, hiatal hernia, gastritis   US  ECHOCARDIOGRAPHY  10/30/2007    EF 55-60%     Current Outpatient Medications:    acetaminophen (TYLENOL) 500 MG tablet, Take 500 mg by mouth 4 (four) times daily as needed., Disp: , Rfl:     amLODipine (NORVASC) 5 MG tablet, TAKE ONE TABLET BY MOUTH DAILY, Disp: 90 tablet, Rfl: 3   b complex vitamins capsule, Take 1 capsule by mouth daily., Disp: , Rfl:    Biotin (BIOTIN MAXIMUM STRENGTH) 10 MG TABS, Take 1 tablet by mouth daily., Disp: , Rfl:    cephALEXin (KEFLEX) 500 MG capsule, Take 1 capsule (500 mg total) by mouth 2 (two) times daily for 7 days. Take for 7 days, Disp: 14 capsule, Rfl: 0   Cholecalciferol (D3 PO), Take by mouth., Disp: , Rfl:    Cyanocobalamin  (VITAMIN B-12 PO), Take 1 tablet by mouth daily as needed (3 times a week)., Disp: , Rfl:    fluticasone  (FLONASE ) 50 MCG/ACT nasal spray, USE ONE SPRAY IN EACH NOSTRIL DAILY, Disp: 16 g, Rfl: 0   fluticasone  (FLONASE ) 50 MCG/ACT nasal spray, Place 2 sprays into both nostrils daily., Disp: 16 g, Rfl: 6   GuaiFENesin (MUCINEX PO), Take 1 tablet by mouth every 12 (twelve) hours as needed (congestion). , Disp: , Rfl:    hydrocortisone  2.5 % cream, Apply topically 2 (two) times daily., Disp: 30 g, Rfl: 1   ibuprofen  (ADVIL ) 600 MG tablet, Take 1 tablet (600 mg total) by mouth every 8 (eight) hours as needed., Disp: 270 tablet, Rfl: 1   ipratropium (ATROVENT ) 0.03 % nasal spray, Place 2 sprays into both nostrils 2 (two) times daily as needed for rhinitis. As directed nasal spray, Disp: 90 mL, Rfl: 1   latanoprost (XALATAN) 0.005 % ophthalmic solution, 1 drop at bedtime., Disp: , Rfl:    lidocaine  (LIDODERM ) 5 %, Place 1 patch onto the skin daily. Remove & Discard patch within 12 hours., Disp: 30 patch, Rfl: 12   Multiple Vitamin (MULTIVITAMIN ADULT PO), , Disp: , Rfl:    Probiotic Product (PROBIOTIC PO), Take by mouth., Disp: , Rfl:    PROLIA  60 MG/ML SOSY injection, Inject into the skin., Disp: , Rfl:    temazepam  (RESTORIL ) 15 MG capsule, TAKE ONE CAPSULE BY MOUTH ONCE NIGHTLY AS NEEDED FOR SLEEP (Patient taking differently: Take 15 mg by mouth as needed.), Disp: , Rfl:    timolol  (TIMOPTIC ) 0.5 % ophthalmic solution, 1 drop  every morning., Disp: , Rfl:   Current Facility-Administered Medications:    denosumab  (PROLIA ) injection 60 mg, 60 mg, Subcutaneous, Q6 months, [name], MD   denosumab  (PROLIA ) injection 60 mg, 60 mg, Subcutaneous, Q6 months, [name], MD  Allergies  Allergen Reactions   Amoxicillin Diarrhea   Atorvastatin Calcium Other (See Comments)   Atorvastatin Calcium Nausea Only   Celecoxib Other (See Comments)    Other Reaction(s): joint pain, Myalgias (intolerance)   Cinacalcet     Other Reaction(s):  Not available   Cinacalcet Hcl Diarrhea   Erythromycin Other (See Comments)    Gi upset   Erythromycin Base Nausea Only   Ezetimibe Nausea Only    Joint pain   Monistat  [Miconazole ]    Penicillins Other (See Comments)   Risedronate Sodium Other (See Comments) and Nausea Only    Joint pain   ROS neg/noncontributory except as noted HPI/below      Objective:     BP (!) 122/58 (BP Location: Left Arm, Cuff Size: Normal)   Pulse 71   Temp 100.3 F (37.9 C)   Ht 5' 2 (1.575 m)   Wt 115 lb (52.2 kg)   SpO2 95%   BMI 21.03 kg/m  Wt Readings from Last 3 Encounters:  10/22/23 115 lb (52.2 kg)  10/09/23 113 lb (51.3 kg)  09/30/23 113 lb (51.3 kg)    Physical Exam VITALS: T- 100.0, BP- 122/58 GENERAL: Well developed, well nourished, no acute distress. HEAD EYES EARS NOSE THROAT: Normocephalic, atraumatic, conjunctiva not injected, sclera nonicteric, ears not swollen, wide open and clear, throat normal. CARDIAC: Regular rate and rhythm, S1 S2 present, no murmur,. NECK: Supple, no thyromegaly, no nodes, no carotid bruits. LUNGS: Clear to auscultation bilaterally, no wheezes. ABDOMEN: Bowel sounds present, soft, non-tender, non-distended, no hepatosplenomegaly, no masses. EXTREMITIES: No edema. MUSCULOSKELETAL: No gross abnormalities.walker NEUROLOGICAL: Alert and oriented x3, cranial nerves II through XII intact. PSYCHIATRIC: Normal mood, good eye  contact.    Results for orders placed or performed in visit on 10/22/23  POCT Urinalysis Dipstick (Automated)   Collection Time: 10/22/23  2:25 PM  Result Value Ref Range   Color, UA normal    Glucose, UA Negative Negative   Bilirubin, UA trace    Ketones, UA negative    Spec Grav, UA 1.020 1.010 - 1.025   Blood, UA negative    pH, UA 5.5 5.0 - 8.0   Protein, UA Negative Negative   Urobilinogen, UA 0.2 0.2 or 1.0 E.U./dL   Nitrite, UA negative    Leukocytes, UA Moderate (2+) (A) Negative         Assessment & Plan:  Chills -     POCT Urinalysis Dipstick (Automated)  Fever, unspecified fever cause -     POCT Urinalysis Dipstick (Automated)  Acute cystitis without hematuria -     Urine Culture  Other orders -     Cephalexin; Take 1 capsule (500 mg total) by mouth 2 (two) times daily for 7 days. Take for 7 days  Dispense: 14 capsule; Refill: 0    Assessment and Plan Assessment & Plan Acute episode of chills, shivering, and generalized weakness   She experienced an acute episode of chills, shivering, and generalized weakness on Sunday morning, accompanied by muscle stiffness, uncontrollable shivering, and a low-grade fever. Differential diagnosis includes viral infection. Fever in a 89 year old is concerning as it may indicate an underlying issue. Perform urinalysis to check for urinary tract infection. Advise monitoring for new symptoms such as cough or changes in condition. Instruct to keep in touch if symptoms change or worsen.  Swelling and pain of left lower extremity   She reports swelling and pain in the left lower extremity, particularly in the bone area, with difficulty lifting her leg during exercises. Improvement noted with aspirin. Pain is suspected to be muscular rather than joint-related. Reassess if symptoms persist or worsen.  Spinal stenosis   Spinal stenosis may contribute to mobility issues and pain.  UTI-keflex 500mg  bid.  Send cx.  Return if  symptoms worsen or fail to improve.  Jenkins CHRISTELLA Carrel, MD

## 2023-10-22 NOTE — Patient Instructions (Signed)
 Monitor symptoms.

## 2023-10-25 ENCOUNTER — Ambulatory Visit: Payer: Self-pay | Admitting: Family Medicine

## 2023-10-25 LAB — URINE CULTURE
MICRO NUMBER:: 17127476
SPECIMEN QUALITY:: ADEQUATE

## 2023-10-25 NOTE — Progress Notes (Signed)
 Urine culture confirms UTI that is sensitive to antibiotics prescribed.  Hope you are doing better

## 2023-10-25 NOTE — Telephone Encounter (Signed)
-----   Message from Jenkins CHRISTELLA Carrel sent at 10/25/2023  9:06 AM EDT ----- Urine culture confirms UTI that is sensitive to antibiotics prescribed.  Hope you are doing better ----- Message ----- From: Agapito Starling, CMA Sent: 10/22/2023   2:26 PM EDT To: Jenkins Earnie Carrel, MD

## 2023-10-25 NOTE — Telephone Encounter (Signed)
 Spoke with patient about recent lab results of UTI and gave notes per Dr Wendolyn. Pt acknowledged understanding.

## 2023-10-29 ENCOUNTER — Telehealth: Payer: Self-pay

## 2023-10-29 NOTE — Telephone Encounter (Signed)
 Copied from CRM [id number]. Topic: Clinical - Medication Question >> Oct 29, 2023  5:14 PM Tysheama G wrote: Reason for CRM: Patient is calling wanting to know what multivitamin will Dr.Kulik advised for her take. Callback number [phone]

## 2023-11-15 ENCOUNTER — Telehealth: Payer: Self-pay

## 2023-11-15 NOTE — Telephone Encounter (Signed)
 Called pt she said yes she has seen one but she also see someone at [facility] she will ask them smk

## 2023-11-15 NOTE — Telephone Encounter (Signed)
 Please advise    Copied from CRM #[id number]. Topic: Clinical - Medication Question >> Nov 15, 2023 11:20 AM Charolett L wrote: Reason for CRM: Patient is requesting a higher dosage on pain meds and the patches aren't working for the pain. Patient is requesting a call back CB#[phone]

## 2023-11-19 ENCOUNTER — Ambulatory Visit: Admitting: Gastroenterology

## 2023-11-25 ENCOUNTER — Telehealth: Payer: Self-pay | Admitting: Family Medicine

## 2023-11-25 ENCOUNTER — Other Ambulatory Visit: Payer: Self-pay

## 2023-11-25 MED ORDER — HYDROCORTISONE 2.5 % EX CREA: TOPICAL_CREAM | Freq: Two times a day (BID) | CUTANEOUS | 1 refills | Status: AC

## 2023-11-25 NOTE — Telephone Encounter (Unsigned)
 Copied from CRM #[id number]. Topic: Clinical - Medication Refill >> Nov 25, 2023  9:50 AM Gustabo D wrote: Medication:  hydrocortisone  2.5 % cream  Triamcenolone-says it's a cream that she get's I don't see it on her list/   Has the patient contacted their pharmacy? No (Agent: If no, request that the patient contact the pharmacy for the refill. If patient does not wish to contact the pharmacy document the reason why and proceed with request.) (Agent: If yes, when and what did the pharmacy advise?)  This is the patient's preferred pharmacy:  [facility] [place], [place] - [address] [place] [address] Phone: [phone] Fax: [phone]    Is this the correct pharmacy for this prescription? Yes If no, delete pharmacy and type the correct one.   Has the prescription been filled recently? No  Is the patient out of the medication? Yes  Has the patient been seen for an appointment in the last year OR does the patient have an upcoming appointment? Yes  Can we respond through MyChart? Yes  Agent: Please be advised that Rx refills may take up to 3 business days. We ask that you follow-up with your pharmacy.

## 2023-11-26 ENCOUNTER — Ambulatory Visit (INDEPENDENT_AMBULATORY_CARE_PROVIDER_SITE_OTHER): Admitting: Otolaryngology

## 2023-11-26 ENCOUNTER — Encounter (INDEPENDENT_AMBULATORY_CARE_PROVIDER_SITE_OTHER): Payer: Self-pay | Admitting: Otolaryngology

## 2023-11-26 VITALS — BP 135/74 | HR 74 | Temp 97.8°F | Ht 62.0 in | Wt 113.0 lb

## 2023-11-26 DIAGNOSIS — R0982 Postnasal drip: Secondary | ICD-10-CM

## 2023-11-26 DIAGNOSIS — J310 Chronic rhinitis: Secondary | ICD-10-CM

## 2023-11-26 DIAGNOSIS — H6123 Impacted cerumen, bilateral: Secondary | ICD-10-CM | POA: Diagnosis not present

## 2023-11-26 DIAGNOSIS — H903 Sensorineural hearing loss, bilateral: Secondary | ICD-10-CM

## 2023-11-26 NOTE — Progress Notes (Unsigned)
 Patient ID: Beth Wise, female   DOB: 03-06-1925, 89 y.o.   MRN: [id number]  Follow-up: Hearing loss, nasal drainage  HPI: The patient is a 89 year old female who returns today for her follow-up evaluation.  The patient has a history of bilateral sensorineural hearing loss.  She was last seen in 2023.  The patient returns today reporting no significant change in her hearing.  She has a new complaint of nasal congestion and nasal drainage.  She denies any facial pain, fever, or visual change.  No other ENT, GI, or respiratory issue noted since the last visit.   Exam: General: Communicates without difficulty, well nourished, no acute distress. Head: Normocephalic, no evidence injury, no tenderness, facial buttresses intact without stepoff. Face/sinus: No tenderness to palpation and percussion. Facial movement is normal and symmetric. Eyes: PERRL, EOMI. No scleral icterus, conjunctivae clear. Neuro: CN II exam reveals vision grossly intact.  No nystagmus at any point of gaze. Auricles: Intact without lesions. EAC: Bilateral cerumen impaction.  Under the operating microscope, the cerumen is carefully removed with a combination of cerumen currette, alligator forceps, and suction catheters.  After the cerumen is removed, the TMs are noted to be normal.  No mass, erythema, or lesions. Nose: External evaluation reveals normal support and skin without lesions.  Dorsum is intact.  Anterior rhinoscopy reveals congested mucosa over anterior aspect of inferior turbinates and intact septum.  No purulence noted. Oral:  Oral cavity and oropharynx are intact, symmetric, without erythema or edema.  Mucosa is moist without lesions. Neck: Full range of motion without pain.  There is no significant lymphadenopathy.  No masses palpable.  Thyroid  bed within normal limits to palpation.  Parotid glands and submandibular glands equal bilaterally without mass.  Trachea is midline. Neuro:  CN 2-12 grossly intact. Gait normal.    Procedure: Bilateral cerumen disimpaction Anesthesia: None Description: Under the operating microscope, the cerumen is carefully removed with a combination of cerumen currette, alligator forceps, and suction catheters.  After the cerumen is removed, the TMs are noted to be normal.  No mass, erythema, or lesions. The patient tolerated the procedure well.   Assessment  1.  Bilateral cerumen impaction.  After the disimpaction procedure, both tympanic membranes and middle ear spaces are noted to be normal.  2.  Subjectively stable bilateral high frequency sensorineural hearing loss. 3.  Chronic rhinitis with nasal mucosal congestion and postnasal drainage.  Plan  1.  Otomicroscopy with bilateral cerumen disimpaction.  2.  The physical exam findings are reviewed with the patient. 3.  Nasal saline irrigation is encouraged.  Instructions on how to perform the irrigation are reviewed.   4.  The patient will return for reevaluation in 1 year.

## 2023-11-27 ENCOUNTER — Other Ambulatory Visit: Payer: Self-pay

## 2023-11-27 DIAGNOSIS — J310 Chronic rhinitis: Secondary | ICD-10-CM | POA: Insufficient documentation

## 2023-11-27 DIAGNOSIS — H6123 Impacted cerumen, bilateral: Secondary | ICD-10-CM | POA: Insufficient documentation

## 2023-11-27 DIAGNOSIS — H903 Sensorineural hearing loss, bilateral: Secondary | ICD-10-CM | POA: Insufficient documentation

## 2023-11-27 DIAGNOSIS — R0982 Postnasal drip: Secondary | ICD-10-CM | POA: Insufficient documentation

## 2023-12-02 ENCOUNTER — Other Ambulatory Visit: Payer: Self-pay | Admitting: Family Medicine

## 2023-12-02 ENCOUNTER — Other Ambulatory Visit: Payer: Self-pay

## 2023-12-02 MED ORDER — FLUTICASONE PROPIONATE 50 MCG/ACT NA SUSP: 1.0000 | Freq: Every day | NASAL | 0 refills | Status: DC

## 2023-12-04 ENCOUNTER — Other Ambulatory Visit (INDEPENDENT_AMBULATORY_CARE_PROVIDER_SITE_OTHER): Payer: Self-pay | Admitting: Otolaryngology

## 2023-12-04 ENCOUNTER — Telehealth (INDEPENDENT_AMBULATORY_CARE_PROVIDER_SITE_OTHER): Payer: Self-pay

## 2023-12-04 MED ORDER — MOMETASONE FUROATE 0.1 % EX CREA: TOPICAL_CREAM | CUTANEOUS | 3 refills | Status: AC

## 2023-12-04 NOTE — Telephone Encounter (Signed)
 Patient called stating the both of patients ear are dry and itchy. Patient asked for advice on what she can do to help with this problem until her next appointment. Patient requested a call back.

## 2023-12-05 ENCOUNTER — Telehealth (INDEPENDENT_AMBULATORY_CARE_PROVIDER_SITE_OTHER): Payer: Self-pay | Admitting: Otolaryngology

## 2023-12-05 NOTE — Telephone Encounter (Signed)
 Patient called and and stated that her ears are dry and itchy. Dr. Karis  send in  Elocon 0.1% cream  with 3 refills at [facility].

## 2023-12-30 ENCOUNTER — Other Ambulatory Visit: Payer: Self-pay | Admitting: Family Medicine

## 2023-12-30 DIAGNOSIS — J301 Allergic rhinitis due to pollen: Secondary | ICD-10-CM

## 2023-12-31 ENCOUNTER — Telehealth: Payer: Self-pay

## 2023-12-31 NOTE — Telephone Encounter (Signed)
 Please Advise   Copied from CRM [id number]. Topic: Clinical - Medical Advice >> Dec 31, 2023 10:06 AM Donna BRAVO wrote: Reason for CRM: Patient asking for update for refill Receipt confirmed by pharmacy (12/30/2023  7:37 PM EST)  Patient asking or opinion on Needling or Acupuncture for back pain. The Tylenol and Ibuprofen  is not working very well.

## 2024-01-03 ENCOUNTER — Telehealth: Payer: Self-pay

## 2024-01-03 ENCOUNTER — Other Ambulatory Visit (HOSPITAL_COMMUNITY): Payer: Self-pay

## 2024-01-03 NOTE — Telephone Encounter (Signed)
 [facility] called to see if the PA is for the generic brand of the Lidoderm  patches as well as the diagnosis.  CB#[phone] Option 2

## 2024-01-03 NOTE — Telephone Encounter (Signed)
 Can we please start a PA for patient. Please and thank you.  Copied from CRM [id number]. Topic: Clinical - Medication Question >> Jan 03, 2024 11:56 AM Alfonso HERO wrote: Reason for CRM: patient calling stating a PA is needed lidocaine  (LIDODERM ) 5 % [facility] [phone])

## 2024-01-03 NOTE — Telephone Encounter (Signed)
 Pharmacy Patient Advocate Encounter   Received notification from Onbase that prior authorization for Lidoderm  5% patches is required/requested.   Insurance verification completed.   The patient is insured through [facility].   Per test claim: PA required; PA submitted to above mentioned insurance via Latent Key/confirmation #/EOC [id number] Status is pending

## 2024-01-06 ENCOUNTER — Telehealth: Payer: Self-pay

## 2024-01-06 NOTE — Telephone Encounter (Signed)
 Copied from CRM #[id number]. Topic: General - Other >> Jan 06, 2024  3:22 PM Alfonso HERO wrote: Reason for CRM: patient calling again in regards to her 12/30 message about acupuncture suggestions. She is asking for a callback.

## 2024-01-07 ENCOUNTER — Other Ambulatory Visit (HOSPITAL_COMMUNITY): Payer: Self-pay

## 2024-01-07 ENCOUNTER — Telehealth: Payer: Self-pay

## 2024-01-07 NOTE — Telephone Encounter (Signed)
Called pt - left message for return call.

## 2024-01-07 NOTE — Telephone Encounter (Signed)
 Copied from CRM #[id number]. Topic: Clinical - Medication Question >> Jan 07, 2024  9:36 AM Mesmerise C wrote: Reason for CRM: Dawn from [facility] stated needing more information regarding the lidocaine  (LIDODERM ) 5 % states needing diagnosis, procedure and etc, will send over fax with all information that is needed if has additional questions can be reached at [phone]

## 2024-01-09 ENCOUNTER — Telehealth: Payer: Self-pay

## 2024-01-09 NOTE — Telephone Encounter (Signed)
 Copied from CRM [id number]. Topic: Referral - Request for Referral >> Jan 09, 2024 12:08 PM Charolett L wrote: Did the patient discuss referral with their provider in the last year? Yes (If No - schedule appointment) (If Yes - send message)  Appointment offered? NO  Type of order/referral and detailed reason for visit: Back pain/scoliosis   Preference of office, provider, location: [facility] [address] [facility], [address], [place], [place] [address] Phone: [phone] Fax# [phone]  If referral order, have you been seen by this specialty before? No (If Yes, this issue or another issue? When? Where?  Can we respond through MyChart? Yes

## 2024-01-10 ENCOUNTER — Other Ambulatory Visit: Payer: Self-pay

## 2024-01-10 DIAGNOSIS — M48061 Spinal stenosis, lumbar region without neurogenic claudication: Secondary | ICD-10-CM

## 2024-01-10 NOTE — Telephone Encounter (Signed)
 Pharmacy Patient Advocate Encounter  Received notification from [facility] that Prior Authorization for Lidoderm  5% patches  has been APPROVED from 01/02/24 to 01/08/25   PA #/Case ID/Reference #: [id number]

## 2024-01-10 NOTE — Telephone Encounter (Signed)
 Called pt and notified smk

## 2024-01-10 NOTE — Telephone Encounter (Signed)
 Referral sent

## 2024-01-14 ENCOUNTER — Telehealth: Payer: Self-pay

## 2024-01-14 NOTE — Telephone Encounter (Signed)
 Prolia  VOB initiated via [email or web address]  Next Prolia  inj DUE: 02/13/24

## 2024-01-15 ENCOUNTER — Other Ambulatory Visit (HOSPITAL_COMMUNITY): Payer: Self-pay

## 2024-01-17 ENCOUNTER — Other Ambulatory Visit: Payer: Self-pay

## 2024-01-17 ENCOUNTER — Telehealth: Payer: Self-pay

## 2024-01-17 ENCOUNTER — Other Ambulatory Visit (HOSPITAL_COMMUNITY): Payer: Self-pay

## 2024-01-17 DIAGNOSIS — M48061 Spinal stenosis, lumbar region without neurogenic claudication: Secondary | ICD-10-CM

## 2024-01-17 NOTE — Telephone Encounter (Signed)
 Copied from CRM #[id number]. Topic: Referral - Question >> Jan 16, 2024  2:59 PM Darshell M wrote: Reason for CRM: Patient was referred to [facility] for an appointment with a provider but the patient is requesting dry needling only. Patient advised by [facility] scheduling that she does not need an appointment with a provider for dry needling. Referral needs to be updated for dry needling.  Patient CB# [phone]

## 2024-01-17 NOTE — Telephone Encounter (Signed)
 Referral sent

## 2024-01-17 NOTE — Telephone Encounter (Signed)
 PA SUBMITTED VIA AVAILITY

## 2024-01-17 NOTE — Telephone Encounter (Signed)
 Pt ready for scheduling for PROLIA  on or after : 02/13/24  Option# 1: Buy/Bill (Office supplied medication)  Out-of-pocket cost due at time of clinic visit: $352  Number of injection/visits approved: 2  Primary: [employer] Prolia  co-insurance: 20% Admin fee co-insurance: 0%  Secondary: --- Prolia  co-insurance:  Admin fee co-insurance:   Medical Benefit Details: Date Benefits were checked: 01/15/24 Deductible: NO/ Coinsurance: 20%/ Admin Fee: 0%  Prior Auth: APPROVED PA# [id number] Expiration Date: 02/13/24-02/12/25  # of doses approved: 2 ----------------------------------------------------------------------- Option# 2- Med Obtained from pharmacy:  Pharmacy benefit: Copay $716.68 (Paid to pharmacy) Admin Fee: 0% (Pay at clinic)  Prior Auth: N/A PA# Expiration Date:   # of doses approved:   If patient wants fill through the pharmacy benefit please send prescription to: [facility], and include estimated need by date in rx notes. Pharmacy will ship medication directly to the office.  Patient NOT eligible for Prolia  Copay Card. Copay Card can make patient's cost as little as $25. Link to apply: [email or web address]://[email or web address].[email or web address]  ** This summary of benefits is an estimation of the patient's out-of-pocket cost. Exact cost may very based on individual plan coverage.

## 2024-01-17 NOTE — Telephone Encounter (Signed)
 Beth Wise

## 2024-01-20 ENCOUNTER — Encounter (HOSPITAL_BASED_OUTPATIENT_CLINIC_OR_DEPARTMENT_OTHER): Payer: Self-pay | Admitting: *Deleted

## 2024-01-20 ENCOUNTER — Other Ambulatory Visit: Payer: Self-pay

## 2024-01-20 ENCOUNTER — Emergency Department (HOSPITAL_BASED_OUTPATIENT_CLINIC_OR_DEPARTMENT_OTHER)
Admission: EM | Admit: 2024-01-20 | Discharge: 2024-01-20 | Discharge status: Against Medical Advice | Source: Ambulatory Visit | Attending: Emergency Medicine | Admitting: Emergency Medicine

## 2024-01-20 ENCOUNTER — Ambulatory Visit: Payer: Self-pay

## 2024-01-20 DIAGNOSIS — Z5321 Procedure and treatment not carried out due to patient leaving prior to being seen by health care provider: Secondary | ICD-10-CM | POA: Insufficient documentation

## 2024-01-20 DIAGNOSIS — K921 Melena: Secondary | ICD-10-CM | POA: Diagnosis present

## 2024-01-20 LAB — COMPREHENSIVE METABOLIC PANEL WITH GFR
ALT: 6 U/L (ref 0–44)
AST: 19 U/L (ref 15–41)
Albumin: 4.5 g/dL (ref 3.5–5.0)
Alkaline Phosphatase: 43 U/L (ref 38–126)
Anion gap: 12 (ref 5–15)
BUN: 19 mg/dL (ref 8–23)
CO2: 22 mmol/L (ref 22–32)
Calcium: 10.8 mg/dL — ABNORMAL HIGH (ref 8.9–10.3)
Chloride: 103 mmol/L (ref 98–111)
Creatinine, Ser: 0.93 mg/dL (ref 0.44–1.00)
GFR, Estimated: 56 mL/min — ABNORMAL LOW
Glucose, Bld: 106 mg/dL — ABNORMAL HIGH (ref 70–99)
Potassium: 4.7 mmol/L (ref 3.5–5.1)
Sodium: 137 mmol/L (ref 135–145)
Total Bilirubin: 0.2 mg/dL (ref 0.0–1.2)
Total Protein: 7.4 g/dL (ref 6.5–8.1)

## 2024-01-20 LAB — CBC
HCT: 30.4 % — ABNORMAL LOW (ref 36.0–46.0)
Hemoglobin: 9.8 g/dL — ABNORMAL LOW (ref 12.0–15.0)
MCH: 31.1 pg (ref 26.0–34.0)
MCHC: 32.2 g/dL (ref 30.0–36.0)
MCV: 96.5 fL (ref 80.0–100.0)
Platelets: 276 K/uL (ref 150–400)
RBC: 3.15 MIL/uL — ABNORMAL LOW (ref 3.87–5.11)
RDW: 14.3 % (ref 11.5–15.5)
WBC: 8.2 K/uL (ref 4.0–10.5)
nRBC: 0 % (ref 0.0–0.2)

## 2024-01-20 NOTE — Telephone Encounter (Signed)
 Spoke with Dr Wendolyn she advised pt needs to go to ER due to could be bleeding from somewhere and due to her age; called pt she still denied offered appt for the am she declined  but ask if Dr Wendolyn has an appt in the afternoon she would like that.

## 2024-01-20 NOTE — ED Triage Notes (Signed)
 Pt is here POV from [facility] where she lives in independent living.  She has been having dark stools for weeks.  Pt does not feel unwell.  No pain at this time.

## 2024-01-20 NOTE — ED Notes (Signed)
 Registration advised Pt left

## 2024-01-20 NOTE — Telephone Encounter (Signed)
 FYI Only or Action Required?: FYI only for provider: ED refused. Additionally pt would like a referral for dry needling.  Patient was last seen in primary care on 10/22/2023 by [name], MD.  Called Nurse Triage reporting Stool Color ChangeStools are black and watery. - Diarrhea - sometimes uncontrolled.  Symptoms began Unsure when this began.  Interventions attempted: OTC medications: Immodium.  Symptoms are: unchanged.  Triage Disposition: Go to ED Now (Notify PCP)  Patient/caregiver understands and will follow disposition?: No, refuses disposition Called CAL to advise of ED refusal.                       Reason for Triage: patient reports black stool, watery, and diarrhea will this be nt    Reason for Disposition  Black or tarry bowel movements  (Exception: Chronic-unchanged black-grey BMs AND is taking iron  pills or Pepto-Bismol.)  Protocols used: Rectal Bleeding-A-AH

## 2024-01-20 NOTE — Telephone Encounter (Signed)
 Copied from CRM #[id number]. Topic: Referral - Question >> Jan 16, 2024  2:59 PM Darshell M wrote: Reason for CRM: Patient was referred to [facility] for an appointment with a provider but the patient is requesting dry needling only. Patient advised by [facility] scheduling that she does not need an appointment with a provider for dry needling. Referral needs to be updated for dry needling.  Patient CB# [id number] >> Jan 20, 2024 10:58 AM Deleta RAMAN wrote: Patient is calling about referral states she does not want to or need to see a physician and would like to just be referred for dry needling only. Please contact [phone]

## 2024-01-20 NOTE — Telephone Encounter (Signed)
 LVM informing pt referral has been updated and sent.

## 2024-01-21 NOTE — Telephone Encounter (Signed)
 Patient called back and denies feeling dizzy or light headed. Patient expressed that she left due to the people around her were sick. Patient scheduled to come in this Thursday.

## 2024-01-21 NOTE — Telephone Encounter (Signed)
 Called and left a message for patient to call the office back to check on her. It looks like she went to the ED but left before being seen.   Copied from CRM [id number]. Topic: Appointments - Scheduling Inquiry for Clinic >> Jan 20, 2024 12:00 PM Mesmerise C wrote: Reason for CRM: Patient inquiring if she can schedule an appt at the ED by her  house at the [employer] off [address] she had food ordered and inquiring if she can wait until then but doesn't know if they take appts

## 2024-01-23 ENCOUNTER — Encounter: Payer: Self-pay | Admitting: Internal Medicine

## 2024-01-23 ENCOUNTER — Telehealth: Payer: Self-pay | Admitting: *Deleted

## 2024-01-23 ENCOUNTER — Ambulatory Visit (INDEPENDENT_AMBULATORY_CARE_PROVIDER_SITE_OTHER): Admitting: Internal Medicine

## 2024-01-23 ENCOUNTER — Ambulatory Visit: Admitting: Family Medicine

## 2024-01-23 VITALS — BP 128/72 | HR 78 | Ht 62.0 in | Wt 111.8 lb

## 2024-01-23 DIAGNOSIS — K52831 Collagenous colitis: Secondary | ICD-10-CM | POA: Diagnosis not present

## 2024-01-23 MED ORDER — BUDESONIDE 3 MG PO CPEP: 9.0000 mg | ORAL_CAPSULE | ORAL | 5 refills | Status: AC

## 2024-01-23 NOTE — Telephone Encounter (Signed)
LVM to schedule Prolia injection  °

## 2024-01-23 NOTE — Telephone Encounter (Signed)
 Pt ready for scheduling for PROLIA  on or after : 02/13/24   Out-of-pocket cost due at time of clinic visit: $352   Number of injection/visits approved: 2      Prior Auth: APPROVED PA# [id number] Expiration Date: 02/13/24-02/12/25  # of doses approved: 2

## 2024-01-23 NOTE — Progress Notes (Signed)
 [address]       Beth Wise y.o. 04-Feb-1925 [id number]  Assessment & Plan:   Encounter Diagnosis  Name Primary?   Collagenous colitis Yes    Chronic, recurrent diarrhea consistent with recurrence of collagenous colitis. No active gastrointestinal bleeding or acute complications.  She was seen in the fall 2025 and Imodium  was handling her diarrhea and I thought she was having IBS-D at that time.  Perhaps she was having the development of a collagenous colitis flare then but it was mild.  Severe symptoms now. - Performed rectal examination and obtained stool sample for occult blood which was brown and negative I do not think she is having a GI bleed.  Hemoglobin is stable. - Prescribed budesonide  3 capsules daily (9 mg) - Arranged prescription to be faxed to [name]. - Scheduled follow-up to assess response.03/17/2024    I appreciate the opportunity to care for this patient. CC: Beth Jenkins Jansky, MD    Subjective:   Chief Complaint: Diarrhea black stools abdominal pain and nausea  HPI  Discussed the use of AI scribe software for clinical note transcription with the patient, who gave verbal consent to proceed.  History of Present Illness   Beth Wise is a 89 year old female with collagenous colitis who presents with four weeks of black, watery diarrhea.  Diarrhea and Stool Changes: - Four weeks of black, watery stools without any formed bowel movements - Stool color has become less black over the past few days but remains watery - Episodes of fecal incontinence, including several accidents, sometimes occurring at night - No new medications started - No use of Pepto Bismol - Imodium  taken for symptom relief without benefit during this episode, though previously effective for colitis - No similar problems aside from prior episode of colitis  Constitutional and Associated Symptoms: - No abdominal pain - No lightheadedness or dizziness - Weakness and fatigue on  Monday - Nocturnal awakening to urinate  Laboratory Findings: - Hemoglobin 9.8, chronically low - History of hypercalcemia, previously treated - Low GFR - Mildly elevated glucose on recent laboratory testing       Wt Readings from Last 3 Encounters:  01/23/24 111 lb 12.8 oz (50.7 kg)  11/26/23 113 lb (51.3 kg)  10/22/23 115 lb (52.2 kg)     Allergies[1] Active Medications[2] Past Medical History:  Diagnosis Date   Allergy    Arthritis    Back pain    Blepharitis    Cancer (HCC)    basal cell on face   Candidal esophagitis (HCC) 2010   Cataract    Chest pain, atypical    Collagenous colitis    Dr. Karyle   Diverticulosis    Clarence' corneal dystrophy    GERD (gastroesophageal reflux disease)    Heart murmur    Hiatal hernia 2010   History of fibromyalgia    Hypercalcemia    Hyperlipidemia    Hypertension    IBS (irritable bowel syndrome)    Insomnia    Osteoporosis    Primary hyperparathyroidism 03/11/2017   Rectocele    Scoliosis    Shingles 1975   at waistline, no chronic pain.     Spinal stenosis    Vaginal prolapse    Past Surgical History:  Procedure Laterality Date   ABDOMINAL HYSTERECTOMY     BREAST BIOPSY  1964   left benign   CARDIOVASCULAR STRESS TEST  12/03/2007   EF 72%   CATARACT EXTRACTION W/ INTRAOCULAR LENS  IMPLANT, BILATERAL     COLONOSCOPY  04/24/2006   diverticulosis   FLEXIBLE SIGMOIDOSCOPY  07/27/2010   diverticulosis, collagenous colitis   TONSILLECTOMY AND ADENOIDECTOMY     UPPER GASTROINTESTINAL ENDOSCOPY  01/29/2008   esophagitis, hiatal hernia, gastritis   US  ECHOCARDIOGRAPHY  10/30/2007    EF 55-60%   Social History   Social History Narrative   Social History      Diet? Regular (shouldn't have excessive amts of acid, hot &spicey foods)      Do you drink/eat things with caffeine? yes      Marital status?       widow                             What year were you married? 38 - 1988      Do you live in a house,  apartment, assisted living, condo, trailer, etc.? apartment      Is it one or more stories? 1      How many persons live in your home? 1 (self)      Do you have any pets in your home? (please list) no no !!      Highest level of education completed?      Current or past profession: hairdresser      Do you exercise?      little                                Type & how often? Leg lifts and plank, stretching every day.      Advanced Directives      Do you have a living will?  yes      Do you have a DNR form?         yes                         If not, do you want to discuss one? yes      Do you have signed POA/HPOA for forms? yes      Functional Status      Do you have difficulty bathing or dressing yourself?      Do you have difficulty preparing food or eating?       Do you have difficulty managing your medications?      Do you have difficulty managing your finances?      Do you have difficulty affording your medications?      2 grands 1 great   family history includes Breast cancer in her mother; Cancer - Other in her sister; Cirrhosis in her sister; Heart disease in her brother, brother, father, and sister; Pneumonia in her sister; Prostate cancer in her son; Stomach cancer in her sister; Stroke in her mother.   Review of Systems As per HPI  Objective:   Physical Exam BP 128/72   Pulse 78   Ht 5' 2 (1.575 m)   Wt 111 lb 12.8 oz (50.7 kg)   BMI 20.45 kg/m   Beth Wise, CMA present  Spry elderly woman no acute distress needs slight assistance to get on the exam table Lungs are clear Heart sounds are normal S1-S2 no rubs or gallops Abdomen is soft thin and nontender bowel sounds present and increased  Rectal exam reveals normal anoderm.  Soft brown stool that is Hemoccult negative.  No mass.  Nontender.    [1]  Allergies Allergen Reactions   Amoxicillin Diarrhea   Atorvastatin Calcium Other (See Comments)   Atorvastatin Calcium Nausea Only    Celecoxib Other (See Comments)    Other Reaction(s): joint pain, Myalgias (intolerance)   Cinacalcet     Other Reaction(s): Not available   Cinacalcet Hcl Diarrhea   Erythromycin Other (See Comments)    Gi upset   Erythromycin Base Nausea Only   Ezetimibe Nausea Only    Joint pain   Monistat  [Miconazole ]    Penicillins Other (See Comments)   Risedronate Sodium Other (See Comments) and Nausea Only    Joint pain  [2]  Current Meds  Medication Sig   acetaminophen (TYLENOL) 500 MG tablet Take 500 mg by mouth 4 (four) times daily as needed. (Patient taking differently: Take 650 mg by mouth 4 (four) times daily as needed.)   amLODipine (NORVASC) 5 MG tablet TAKE ONE TABLET BY MOUTH DAILY   b complex vitamins capsule Take 1 capsule by mouth daily.   Cholecalciferol (D3 PO) Take by mouth.   Cyanocobalamin  (VITAMIN B-12 PO) Take 1 tablet by mouth daily as needed (3 times a week).   fluticasone  (FLONASE ) 50 MCG/ACT nasal spray Place 2 sprays into both nostrils daily.   fluticasone  (FLONASE ) 50 MCG/ACT nasal spray Place 1 spray into both nostrils daily.   GuaiFENesin (MUCINEX PO) Take 1 tablet by mouth every 12 (twelve) hours as needed (congestion).  (Patient taking differently: Take 1 tablet by mouth daily.)   hydrocortisone  2.5 % cream Apply topically 2 (two) times daily.   ibuprofen  (ADVIL ) 600 MG tablet Take 1 tablet (600 mg total) by mouth every 8 (eight) hours as needed.   ipratropium (ATROVENT ) 0.03 % nasal spray USE TWO SPRAYS IN EACH NOSTRIL TWICE DAILY AS NEEDED RHINITIS.   latanoprost (XALATAN) 0.005 % ophthalmic solution 1 drop at bedtime.   lidocaine  (LIDODERM ) 5 % Place 1 patch onto the skin daily. Remove & Discard patch within 12 hours.   mometasone  (ELOCON ) 0.1 % cream Apply topically daily as needed for itch   Multiple Vitamin (MULTIVITAMIN ADULT PO)    Probiotic Product (PROBIOTIC PO) Take by mouth.   PROLIA  60 MG/ML SOSY injection Inject into the skin.   temazepam  (RESTORIL )  15 MG capsule TAKE ONE CAPSULE BY MOUTH ONCE NIGHTLY AS NEEDED FOR SLEEP   timolol  (TIMOPTIC ) 0.5 % ophthalmic solution 1 drop every morning.   [DISCONTINUED] Biotin (BIOTIN MAXIMUM STRENGTH) 10 MG TABS Take 1 tablet by mouth daily.   Current Facility-Administered Medications for the 01/23/24 encounter (Office Visit) with Avram Lupita BRAVO, MD  Medication   denosumab  (PROLIA ) injection 60 mg   denosumab  (PROLIA ) injection 60 mg   "

## 2024-01-23 NOTE — Patient Instructions (Addendum)
" °  VISIT SUMMARY: Today, you were seen for four weeks of black, watery diarrhea, which is likely a recurrence of your collagenous colitis. We discussed your symptoms, performed a rectal examination, and obtained a stool sample for further testing.  YOUR PLAN: COLLAGENOUS COLITIS: You have been experiencing chronic, recurrent diarrhea, which is consistent with a recurrence of your collagenous colitis. There are no signs of active gastrointestinal bleeding or acute complications. -You will start taking budesonide , 3 capsules daily. -We have arranged for your prescription to be faxed to [name]. -We will schedule a follow-up appointment to assess your response to the treatment.   I appreciate the opportunity to care for you. Lupita Commander, MD, [facility]     "

## 2024-01-29 ENCOUNTER — Ambulatory Visit: Admitting: Family Medicine

## 2024-01-29 ENCOUNTER — Encounter: Payer: Self-pay | Admitting: Family Medicine

## 2024-01-29 VITALS — BP 134/72 | HR 76 | Temp 97.4°F | Ht 62.0 in | Wt 111.4 lb

## 2024-01-29 DIAGNOSIS — M810 Age-related osteoporosis without current pathological fracture: Secondary | ICD-10-CM | POA: Diagnosis not present

## 2024-01-29 DIAGNOSIS — D638 Anemia in other chronic diseases classified elsewhere: Secondary | ICD-10-CM

## 2024-01-29 DIAGNOSIS — E210 Primary hyperparathyroidism: Secondary | ICD-10-CM | POA: Diagnosis not present

## 2024-01-29 DIAGNOSIS — M48061 Spinal stenosis, lumbar region without neurogenic claudication: Secondary | ICD-10-CM

## 2024-01-29 DIAGNOSIS — I10 Essential (primary) hypertension: Secondary | ICD-10-CM

## 2024-01-29 DIAGNOSIS — K52831 Collagenous colitis: Secondary | ICD-10-CM

## 2024-01-29 MED ORDER — DENOSUMAB 60 MG/ML ~~LOC~~ SOSY: 60.0000 mg | PREFILLED_SYRINGE | Freq: Once | SUBCUTANEOUS | 0 refills | Status: AC

## 2024-01-29 MED ORDER — FAMOTIDINE 20 MG PO TABS: 20.0000 mg | ORAL_TABLET | Freq: Two times a day (BID) | ORAL | 1 refills | Status: AC

## 2024-01-29 NOTE — Progress Notes (Signed)
 "  Subjective:     Patient ID: Beth Wise, female    DOB: 05-12-1925, 89 y.o.   MRN: [id number]  Chief Complaint  Patient presents with   Osteoporosis    Pt is here for chronic issues    Discussed the use of AI scribe software for clinical note transcription with the patient, who gave verbal consent to proceed.  History of Present Illness Beth Wise is a 89 year old female with hypercalcemia, osteoporosis, colaginous colitis who presents for follow-up after recent black stools and elevated calcium levels.  She experienced black, loose stools last week, which led her to visit the medical center. She left before a full evaluation was completed and later learned her calcium level was 10.8, which is concerning given her history of hypercalcemia. She has previously seen an endocrinologist for high calcium levels, attributed to parathyroid issues, and was treated with a medication she recalls as 'calcinet'.  She continues to experience gastrointestinal symptoms, including diarrhea, despite adhering to a BRAT diet. She is concerned that her high calcium levels may be contributing to these symptoms. She has been taking ibuprofen  600 mg three times a day for over three years for back pain and also takes a high dosage of Tylenol.  She did see GI-guiac negative, Hgb stable and rx meds, but pt wants to monitor  She is currently on Prolia , which she notes can increase calcium levels. She took last Prolia  shot six months ago. She is due for her next shot soon.  She experiences some breathlessness, particularly in the mornings, and has noticed edema, especially around her ankles, which she attributes to her amlodipine use. No chest pain but some nausea, managed with ginger chews. She also reports mucus in her throat, which she attributes to reflux.  Her current medications include ibuprofen  600 mg three times a day, Tylenol for back pain, amlodipine 5 mg daily for blood pressure, and temazepam , which  she rarely uses for sleep. She has reduced her Mucinex intake to one a day and has stopped taking a multivitamin. She continues to take a B vitamin supplement. No calcium    Health Maintenance Due  Topic Date Due   Pneumococcal Vaccine: 50+ Years (2 of 2 - PPSV23, PCV20, or PCV21) 01/02/2017   Bone Density Scan  09/20/2021   Medicare Annual Wellness (AWV)  01/05/2023    Past Medical History:  Diagnosis Date   Allergy    Arthritis    Back pain    Blepharitis    Cancer (HCC)    basal cell on face   Candidal esophagitis (HCC) 2010   Cataract    Chest pain, atypical    Collagenous colitis    Dr. Karyle   Diverticulosis    Clarence' corneal dystrophy    GERD (gastroesophageal reflux disease)    Heart murmur    Hiatal hernia 2010   History of fibromyalgia    Hypercalcemia    Hyperlipidemia    Hypertension    IBS (irritable bowel syndrome)    Insomnia    Osteoporosis    Primary hyperparathyroidism 03/11/2017   Rectocele    Scoliosis    Shingles 1975   at waistline, no chronic pain.     Spinal stenosis    Vaginal prolapse     Past Surgical History:  Procedure Laterality Date   ABDOMINAL HYSTERECTOMY     BREAST BIOPSY  1964   left benign   CARDIOVASCULAR STRESS TEST  12/03/2007   EF 72%  CATARACT EXTRACTION W/ INTRAOCULAR LENS  IMPLANT, BILATERAL     COLONOSCOPY  04/24/2006   diverticulosis   FLEXIBLE SIGMOIDOSCOPY  07/27/2010   diverticulosis, collagenous colitis   TONSILLECTOMY AND ADENOIDECTOMY     UPPER GASTROINTESTINAL ENDOSCOPY  01/29/2008   esophagitis, hiatal hernia, gastritis   US  ECHOCARDIOGRAPHY  10/30/2007    EF 55-60%    Current Medications[1]  Allergies[2] ROS neg/noncontributory except as noted HPI/below      Objective:     BP 134/72 (BP Location: Left Arm, Patient Position: Sitting, Cuff Size: Normal)   Pulse 76   Temp (!) 97.4 F (36.3 C) (Temporal)   Ht 5' 2 (1.575 m)   Wt 111 lb 6 oz (50.5 kg)   SpO2 98%   BMI 20.37 kg/m  Wt  Readings from Last 3 Encounters:  01/29/24 111 lb 6 oz (50.5 kg)  01/23/24 111 lb 12.8 oz (50.7 kg)  11/26/23 113 lb (51.3 kg)    Physical Exam GENERAL: Well developed, well nourished, no acute distress. HEAD EYES EARS NOSE THROAT: Normocephalic, atraumatic, conjunctiva not injected, sclera nonicteric. CARDIAC: Regular rate and rhythm, S1 S2 present, no murmur NECK: Supple, no thyromegaly, no nodes, no carotid bruits. LUNGS: Clear to auscultation bilaterally, no wheezes. ABDOMEN: Bowel sounds present, soft, non-tender, non-distended, no hepatosplenomegaly, no masses. EXTREMITIES: tr ankle edema. MUSCULOSKELETAL: No gross abnormalities-walker. NEUROLOGICAL: Alert and oriented x3, cranial nerves II through XII intact. PSYCHIATRIC: Normal mood, good eye contact.  Reviewed ER labs and GI notes       Assessment & Plan:  Benign essential hypertension  Senile osteoporosis -     Basic metabolic panel with GFR -     VITAMIN D  25 Hydroxy (Vit-D Deficiency, Fractures) -     Denosumab ; Inject 60 mg into the skin once for 1 dose.  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  Dispense: 180 tablet; Refill: 1    Assessment and Plan Assessment & Plan Hypercalcemia  - h/o hyperparathyroidism Calcium level is 10.8, previously managed with calcinet by an endocrinologist(retired). Current elevation may be due to lab/ER, as previous levels were normal in July and September. Repeated blood work will assess calcium levels. Referral to an endocrinologist will be made if calcium remains elevated.  Senile osteoporosis   Managed with Prolia , with the next injection due in a couple of weeks. Ordered Prolia  injection for continued osteoporosis  management.  Chronic back pain  - Lumbar stenosis Managed with ibuprofen  and Tylenol. Narcotics are not recommended due to age-related risks such as constipation, confusion, dizziness, and fall risk. A heating pad is used for relief. Continue current pain management with ibuprofen  and Tylenol and use a heating pad for pain relief.  Hypertension   Well-controlled with amlodipine 5 mg daily. Blood pressure was good during the visit. Amlodipine may cause leg swelling due to vasodilation. Continue amlodipine 5 mg daily.  Gastrointestinal bleeding due to chronic NSAID use   There is concern for gastrointestinal bleeding due to chronic ibuprofen  use. No blood was found in stool during a recent rectal exam, but stools have been dark, raising concern for intermittent bleeding. Ibuprofen  use without stomach protection increases the risk of GI bleeding and ulcers. Prescribed Pepcid  (famotidine ) to protect the  stomach lining and advised to monitor stool color and report any changes. Reck hgb     Return in about 4 months (around 05/28/2024) for chronic follow-up.  Jenkins CHRISTELLA Carrel, MD     [1]  Current Outpatient Medications:    acetaminophen (TYLENOL) 500 MG tablet, Take 500 mg by mouth 4 (four) times daily as needed. (Patient taking differently: Take 650 mg by mouth 4 (four) times daily as needed.), Disp: , Rfl:    amLODipine (NORVASC) 5 MG tablet, TAKE ONE TABLET BY MOUTH DAILY, Disp: 90 tablet, Rfl: 3   b complex vitamins capsule, Take 1 capsule by mouth daily., Disp: , Rfl:    budesonide  (ENTOCORT EC ) 3 MG 24 hr capsule, Take 3 capsules (9 mg total) by mouth every morning., Disp: 100 capsule, Rfl: 5   Cholecalciferol (D3 PO), Take by mouth., Disp: , Rfl:    Cyanocobalamin  (VITAMIN B-12 PO), Take 1 tablet by mouth daily as needed (3 times a week)., Disp: , Rfl:    denosumab  (PROLIA ) 60 MG/ML SOSY injection, Inject 60 mg into the skin once for 1 dose., Disp: 1 mL, Rfl: 0   famotidine  (PEPCID ) 20 MG  tablet, Take 1 tablet (20 mg total) by mouth 2 (two) times daily., Disp: 180 tablet, Rfl: 1   fluticasone  (FLONASE ) 50 MCG/ACT nasal spray, Place 2 sprays into both nostrils daily., Disp: 16 g, Rfl: 6   fluticasone  (FLONASE ) 50 MCG/ACT nasal spray, Place 1 spray into both nostrils daily., Disp: 16 g, Rfl: 1   GuaiFENesin (MUCINEX PO), Take 1 tablet by mouth every 12 (twelve) hours as needed (congestion).  (Patient taking differently: Take 1 tablet by mouth daily.), Disp: , Rfl:    hydrocortisone  2.5 % cream, Apply topically 2 (two) times daily., Disp: 30 g, Rfl: 1   ibuprofen  (ADVIL ) 600 MG tablet, Take 1 tablet (600 mg total) by mouth every 8 (eight) hours as needed., Disp: 270 tablet, Rfl: 1   ipratropium (ATROVENT ) 0.03 % nasal spray, USE TWO SPRAYS IN EACH NOSTRIL TWICE DAILY AS NEEDED RHINITIS., Disp: 60 mL, Rfl: 1   latanoprost (XALATAN) 0.005 % ophthalmic solution, 1 drop at bedtime., Disp: , Rfl:    lidocaine  (LIDODERM ) 5 %, Place 1 patch onto the skin daily. Remove & Discard patch within 12 hours., Disp: 30 patch, Rfl: 12   mometasone  (ELOCON ) 0.1 % cream, Apply topically daily as needed for itch, Disp: 15 g, Rfl: 3   Multiple Vitamin (MULTIVITAMIN ADULT PO), , Disp: , Rfl:    Probiotic Product (PROBIOTIC PO), Take by mouth., Disp: , Rfl:    PROLIA  60 MG/ML SOSY injection, Inject into the skin., Disp: , Rfl:    temazepam  (RESTORIL ) 15 MG capsule, TAKE ONE CAPSULE BY MOUTH ONCE NIGHTLY AS NEEDED FOR SLEEP, Disp: 30 capsule, Rfl: 0   timolol  (TIMOPTIC ) 0.5 % ophthalmic solution, 1 drop every morning., Disp: , Rfl:   Current Facility-Administered Medications:    denosumab  (PROLIA ) injection 60 mg, 60 mg, Subcutaneous, Q6 months, Carrel Jenkins Jansky, MD   denosumab  (PROLIA ) injection 60 mg, 60 mg, Subcutaneous, Q6 months, Carrel Jenkins Jansky, MD [2]  Allergies Allergen Reactions   Amoxicillin Diarrhea   Atorvastatin Calcium Other (See Comments)   Atorvastatin Calcium Nausea Only   Celecoxib  Other (See Comments)    Other Reaction(s): joint pain, Myalgias (intolerance)   Cinacalcet     Other Reaction(s): Not available   Cinacalcet Hcl Diarrhea   Erythromycin Other (See Comments)    Gi upset  Erythromycin Base Nausea Only   Ezetimibe Nausea Only    Joint pain   Monistat  [Miconazole ]    Penicillins Other (See Comments)   Risedronate Sodium Other (See Comments) and Nausea Only    Joint pain   "

## 2024-01-29 NOTE — Patient Instructions (Signed)

## 2024-01-30 ENCOUNTER — Ambulatory Visit: Payer: Self-pay | Admitting: Family Medicine

## 2024-01-30 LAB — BASIC METABOLIC PANEL WITH GFR
BUN: 20 mg/dL (ref 6–23)
CO2: 25 meq/L (ref 19–32)
Calcium: 11.1 mg/dL — ABNORMAL HIGH (ref 8.4–10.5)
Chloride: 100 meq/L (ref 96–112)
Creatinine, Ser: 1.05 mg/dL (ref 0.40–1.20)
GFR: 44.87 mL/min — ABNORMAL LOW
Glucose, Bld: 87 mg/dL (ref 70–99)
Potassium: 5.1 meq/L (ref 3.5–5.1)
Sodium: 131 meq/L — ABNORMAL LOW (ref 135–145)

## 2024-01-30 LAB — VITAMIN D 25 HYDROXY (VIT D DEFICIENCY, FRACTURES): VITD: 53.79 ng/mL (ref 30.00–100.00)

## 2024-01-30 LAB — CBC WITH DIFFERENTIAL/PLATELET
Basophils Absolute: 0 10*3/uL (ref 0.0–0.1)
Basophils Relative: 0.6 % (ref 0.0–3.0)
Eosinophils Absolute: 0.1 10*3/uL (ref 0.0–0.7)
Eosinophils Relative: 1.6 % (ref 0.0–5.0)
HCT: 31.4 % — ABNORMAL LOW (ref 36.0–46.0)
Hemoglobin: 10.3 g/dL — ABNORMAL LOW (ref 12.0–15.0)
Lymphocytes Relative: 16.5 % (ref 12.0–46.0)
Lymphs Abs: 1.2 10*3/uL (ref 0.7–4.0)
MCHC: 32.8 g/dL (ref 30.0–36.0)
MCV: 95.3 fl (ref 78.0–100.0)
Monocytes Absolute: 0.6 10*3/uL (ref 0.1–1.0)
Monocytes Relative: 8.5 % (ref 3.0–12.0)
Neutro Abs: 5.2 10*3/uL (ref 1.4–7.7)
Neutrophils Relative %: 72.8 % (ref 43.0–77.0)
Platelets: 291 10*3/uL (ref 150.0–400.0)
RBC: 3.3 Mil/uL — ABNORMAL LOW (ref 3.87–5.11)
RDW: 15 % (ref 11.5–15.5)
WBC: 7.2 10*3/uL (ref 4.0–10.5)

## 2024-01-30 NOTE — Progress Notes (Signed)
 Labs stable except calcium is elevated.  Refer to endocrinology-elevated calcium and hyperparathyroidism

## 2024-01-31 ENCOUNTER — Other Ambulatory Visit: Payer: Self-pay

## 2024-01-31 DIAGNOSIS — E210 Primary hyperparathyroidism: Secondary | ICD-10-CM

## 2024-01-31 NOTE — Telephone Encounter (Signed)
 Copied from CRM #[id number]. Topic: Referral - Question >> Jan 31, 2024 10:04 AM Vena HERO wrote: Reason for CRM: Pt states that she needs the referral to be for dry needling and Samantha at [facility] is the one who does. Not sure of the providers last name but she is requesting this be corrected to indicate ' dry needling'. Please call pt to advise

## 2024-01-31 NOTE — Telephone Encounter (Signed)
 Referral has been placed. Patient notified via MyChart   Did the patient discuss referral with their provider in the last year? Yes (If No - schedule appointment) (If Yes - send message)  Appointment offered? No  Type of order/referral and detailed reason for visit: endocrinology, elevated calcium and hyperparathyroidism  Preference of office, provider, location: within area that's recommended by provider  If referral order, have you been seen by this specialty before? Yes (If Yes, this issue or another issue? When? Where?  Can we respond through MyChart? Yes

## 2024-01-31 NOTE — Telephone Encounter (Signed)
 Copied from CRM #[id number]. Topic: Clinical - Lab/Test Results >> Jan 31, 2024  9:46 AM Beth Wise wrote: Reason for CRM: Patient requesting a call back to further discuss lab results.   CB#[phone]

## 2024-01-31 NOTE — Progress Notes (Signed)
 Pt has read results and sent in referral

## 2024-01-31 NOTE — Telephone Encounter (Signed)
 Received tier reduction denial for Medicare Part D. Denial letter in media.

## 2024-02-04 ENCOUNTER — Telehealth: Payer: Self-pay | Admitting: Family Medicine

## 2024-02-04 ENCOUNTER — Other Ambulatory Visit: Payer: Self-pay

## 2024-02-04 ENCOUNTER — Telehealth: Payer: Self-pay

## 2024-02-04 DIAGNOSIS — E210 Primary hyperparathyroidism: Secondary | ICD-10-CM

## 2024-02-04 NOTE — Telephone Encounter (Signed)
 Copied from CRM [id number]. Topic: Clinical - Medication Question >> Feb 04, 2024  2:30 PM Viola F wrote: Reason for CRM: Patient returned call regarding prolia  injection - she said her new insurance devoted is requiring her to pay her deductible AND the prolia  injection which will equal over &400 and she cannot afford that. She wants to know if there's a way the office can get the injection? Please call  [phone]

## 2024-02-04 NOTE — Telephone Encounter (Signed)
 Please advise   Copied from CRM [id number]. Topic: Referral - Request for Referral >> Feb 04, 2024  2:20 PM Viola F wrote: Did the patient discuss referral with their provider in the last year? Yes (If No - schedule appointment) (If Yes - send message)  Appointment offered? No  Type of order/referral and detailed reason for visit:  E21.0 (ICD-10-CM) - Primary hyperparathyroidism E83.52 (ICD-10-CM) - Serum calcium elevated  Preference of office, provider, location: Dr. Marcille Caffey -Endocrinologist  Patient needs all office notes and calcium labs faxed over along with referral  She was referred to an Endocrinologist but since she lives at [facility] she would like to see the specialist there.   [phone] F:[phone]  If referral order, have you been seen by this specialty before? No (If Yes, this issue or another issue? When? Where?  Can we respond through MyChart? Yes, please let her know when referral is in

## 2024-02-05 ENCOUNTER — Other Ambulatory Visit: Payer: Self-pay | Admitting: *Deleted

## 2024-02-05 DIAGNOSIS — M810 Age-related osteoporosis without current pathological fracture: Secondary | ICD-10-CM

## 2024-02-05 NOTE — Telephone Encounter (Signed)
 Spoke with patient, patient stated Prolia  price was to high for her  Advise for her to talk to her insurance for alternatives, stated she did and prices was the same as our PA  Referral to osteoporosis clinic was advise and patient was agreeable. Referral send VO was given by Dr Wendolyn

## 2024-02-07 ENCOUNTER — Telehealth: Payer: Self-pay

## 2024-02-07 DIAGNOSIS — K921 Melena: Secondary | ICD-10-CM

## 2024-02-07 NOTE — Telephone Encounter (Signed)
 Copied from CRM [id number]. Topic: Clinical - Medication Question >> Feb 04, 2024  2:30 PM Viola F wrote: Reason for CRM: Patient returned call regarding prolia  injection - she said her new insurance devoted is requiring her to pay her deductible AND the prolia  injection which will equal over &400 and she cannot afford that. She wants to know if there's a way the office can get the injection? Please call  [phone] >> Feb 06, 2024  5:45 PM China J wrote: Patient calling back to see if Dr. Enos nurse could give her a call about rushing a request for the need of a prolia  shot from Dr. Becki office over at endocrinology.  The patient would like if someone could call her at [phone].    Called and discussed with pt

## 2024-02-07 NOTE — Telephone Encounter (Signed)
 Copied from CRM [id number]. Topic: Referral - Status >> Feb 07, 2024 10:41 AM Viola F wrote: Reason for CRM: Patient called regarding referral to Endocrinology - they do not have the referral and needs it resent asap. Please let patient know when referral is resent so she can call and get scheduled [phone]

## 2024-02-07 NOTE — Telephone Encounter (Signed)
 I have pended a CBC and hemoccult cards for her to do   We do not know that the dark stools are actually bleeding  If she is taking Pepto bismol or iron  could make stools black  If she is taking iron  I want her to stop

## 2024-02-07 NOTE — Telephone Encounter (Signed)
 Copied from CRM [id number]. Topic: Referral - Prior Authorization Question >> Feb 07, 2024 11:06 AM Delon DASEN wrote: Reason for CRM: [facility] needs PA for [facility]- [phone]

## 2024-02-07 NOTE — Telephone Encounter (Signed)
 I spoke with Beth Wise and she will try to come next week when its a little warmer to get her blood drawn and pick up the hemoccults. She is doing the [employer] and its helping. She said she was having a little SOB after fixing her breakfast. I told her to monitor that and if gets worse call for help. She lives at [place] [facility]. She said she is not on iron  and hasn't taken any Pepto.

## 2024-02-07 NOTE — Telephone Encounter (Signed)
 Beth Wise is concerned about her stools. Sunday normal but dark in color and then on Monday had three watery black stools. I asked if she had started her budesonide  yet. She said she had told [name] to hold it because she didn't believe she had the colitis. She said I know Dr Avram will scold me. I told her Dr Avram wanted her to take this rx . He rx'ed at her recent office visit. She has called the pharmacy back and they have told her it will be 3-5 weeks in coming.   [name] is asking is there a test to see where the bleeding is coming from. Please advise [name] and thank you for your time.

## 2024-02-12 ENCOUNTER — Ambulatory Visit: Admitting: Family Medicine

## 2024-02-12 ENCOUNTER — Ambulatory Visit: Admitting: Gastroenterology

## 2024-02-13 ENCOUNTER — Encounter: Admitting: Physician Assistant

## 2024-03-17 ENCOUNTER — Ambulatory Visit: Admitting: Internal Medicine

## 2024-04-20 ENCOUNTER — Ambulatory Visit

## 2024-06-02 ENCOUNTER — Ambulatory Visit: Admitting: Family Medicine
